# Patient Record
Sex: Male | Born: 1950 | Race: White | Hispanic: No | Marital: Married | Smoking: Current some day smoker
Health system: Southern US, Community
[De-identification: ages and names within clinical notes are randomized; demographics above are authoritative.]

## PROBLEM LIST (undated history)

## (undated) ENCOUNTER — Emergency Department: Admission: EM | Payer: BLUE CROSS/BLUE SHIELD | Source: Home / Self Care

## (undated) DIAGNOSIS — N529 Male erectile dysfunction, unspecified: Secondary | ICD-10-CM

## (undated) DIAGNOSIS — I493 Ventricular premature depolarization: Secondary | ICD-10-CM

## (undated) DIAGNOSIS — IMO0001 Reserved for inherently not codable concepts without codable children: Secondary | ICD-10-CM

## (undated) DIAGNOSIS — I739 Peripheral vascular disease, unspecified: Secondary | ICD-10-CM

## (undated) DIAGNOSIS — R519 Headache, unspecified: Secondary | ICD-10-CM

## (undated) DIAGNOSIS — S7400XA Injury of sciatic nerve at hip and thigh level, unspecified leg, initial encounter: Secondary | ICD-10-CM

## (undated) DIAGNOSIS — R0609 Other forms of dyspnea: Secondary | ICD-10-CM

## (undated) DIAGNOSIS — D539 Nutritional anemia, unspecified: Secondary | ICD-10-CM

## (undated) DIAGNOSIS — Z7901 Long term (current) use of anticoagulants: Secondary | ICD-10-CM

## (undated) DIAGNOSIS — F1010 Alcohol abuse, uncomplicated: Secondary | ICD-10-CM

## (undated) DIAGNOSIS — H35039 Hypertensive retinopathy, unspecified eye: Secondary | ICD-10-CM

## (undated) DIAGNOSIS — I4819 Other persistent atrial fibrillation: Secondary | ICD-10-CM

## (undated) DIAGNOSIS — M48061 Spinal stenosis, lumbar region without neurogenic claudication: Secondary | ICD-10-CM

## (undated) DIAGNOSIS — I499 Cardiac arrhythmia, unspecified: Secondary | ICD-10-CM

## (undated) DIAGNOSIS — I10 Essential (primary) hypertension: Secondary | ICD-10-CM

## (undated) DIAGNOSIS — M5030 Other cervical disc degeneration, unspecified cervical region: Secondary | ICD-10-CM

## (undated) DIAGNOSIS — F17200 Nicotine dependence, unspecified, uncomplicated: Secondary | ICD-10-CM

## (undated) DIAGNOSIS — J449 Chronic obstructive pulmonary disease, unspecified: Secondary | ICD-10-CM

## (undated) DIAGNOSIS — E785 Hyperlipidemia, unspecified: Secondary | ICD-10-CM

## (undated) DIAGNOSIS — I700 Atherosclerosis of aorta: Secondary | ICD-10-CM

## (undated) DIAGNOSIS — Z77090 Contact with and (suspected) exposure to asbestos: Secondary | ICD-10-CM

## (undated) DIAGNOSIS — I2510 Atherosclerotic heart disease of native coronary artery without angina pectoris: Secondary | ICD-10-CM

## (undated) DIAGNOSIS — R768 Other specified abnormal immunological findings in serum: Secondary | ICD-10-CM

## (undated) DIAGNOSIS — M1990 Unspecified osteoarthritis, unspecified site: Secondary | ICD-10-CM

## (undated) DIAGNOSIS — G43909 Migraine, unspecified, not intractable, without status migrainosus: Secondary | ICD-10-CM

## (undated) DIAGNOSIS — K759 Inflammatory liver disease, unspecified: Secondary | ICD-10-CM

## (undated) HISTORY — DX: Atherosclerotic heart disease of native coronary artery without angina pectoris: I25.10

## (undated) HISTORY — DX: Atherosclerosis of aorta: I70.0

## (undated) HISTORY — PX: JOINT REPLACEMENT: SHX530

## (undated) HISTORY — DX: Alcohol abuse, uncomplicated: F10.10

## (undated) HISTORY — DX: Injury of sciatic nerve at hip and thigh level, unspecified leg, initial encounter: S74.00XA

## (undated) HISTORY — DX: Other specified abnormal immunological findings in serum: R76.8

## (undated) HISTORY — DX: Essential (primary) hypertension: I10

## (undated) HISTORY — DX: Chronic obstructive pulmonary disease, unspecified: J44.9

## (undated) HISTORY — DX: Hyperlipidemia, unspecified: E78.5

## (undated) HISTORY — DX: Nicotine dependence, unspecified, uncomplicated: F17.200

## (undated) HISTORY — DX: Spinal stenosis, lumbar region without neurogenic claudication: M48.061

---

## 1982-07-18 HISTORY — PX: KNEE ARTHROSCOPY: SHX127

## 1996-05-22 ENCOUNTER — Encounter: Payer: Self-pay | Admitting: Family Medicine

## 1996-05-22 LAB — CONVERTED CEMR LAB
Blood Glucose, Fasting: 96 mg/dL
TSH: 2.48 microintl units/mL

## 1996-05-23 ENCOUNTER — Encounter: Payer: Self-pay | Admitting: Family Medicine

## 1996-05-23 LAB — CONVERTED CEMR LAB: PSA: 0.8 ng/mL

## 1996-06-17 DIAGNOSIS — E785 Hyperlipidemia, unspecified: Secondary | ICD-10-CM

## 1996-06-17 HISTORY — DX: Hyperlipidemia, unspecified: E78.5

## 1998-05-04 ENCOUNTER — Encounter: Payer: Self-pay | Admitting: Family Medicine

## 1998-05-04 LAB — CONVERTED CEMR LAB: Blood Glucose, Fasting: 111 mg/dL

## 1998-05-18 DIAGNOSIS — I10 Essential (primary) hypertension: Secondary | ICD-10-CM

## 1998-05-18 HISTORY — DX: Essential (primary) hypertension: I10

## 1998-07-18 DIAGNOSIS — J449 Chronic obstructive pulmonary disease, unspecified: Secondary | ICD-10-CM

## 1998-07-18 HISTORY — DX: Chronic obstructive pulmonary disease, unspecified: J44.9

## 1999-11-29 ENCOUNTER — Encounter: Payer: Self-pay | Admitting: Family Medicine

## 1999-11-29 LAB — CONVERTED CEMR LAB
Blood Glucose, Fasting: 110 mg/dL
TSH: 1.04 microintl units/mL

## 2000-11-08 ENCOUNTER — Encounter: Payer: Self-pay | Admitting: Family Medicine

## 2000-11-08 LAB — CONVERTED CEMR LAB
Blood Glucose, Fasting: 111 mg/dL
TSH: 2.12 microintl units/mL

## 2001-07-18 HISTORY — PX: OTHER SURGICAL HISTORY: SHX169

## 2001-07-18 HISTORY — PX: ANTERIOR CERVICAL DECOMP/DISCECTOMY FUSION: SHX1161

## 2001-11-29 ENCOUNTER — Encounter: Payer: Self-pay | Admitting: Family Medicine

## 2001-11-29 LAB — CONVERTED CEMR LAB
PSA: 0.6 ng/mL
TSH: 1.77 microintl units/mL

## 2002-06-26 ENCOUNTER — Inpatient Hospital Stay (HOSPITAL_COMMUNITY): Admission: RE | Admit: 2002-06-26 | Discharge: 2002-06-28 | Payer: Self-pay | Admitting: Neurosurgery

## 2003-01-16 ENCOUNTER — Encounter: Payer: Self-pay | Admitting: Family Medicine

## 2003-01-16 LAB — CONVERTED CEMR LAB
Blood Glucose, Fasting: 101 mg/dL
PSA: 0.5 ng/mL
RBC count: 4.49 10*6/uL
TSH: 2.36 microintl units/mL
WBC, blood: 5.5 10*3/uL

## 2004-04-27 ENCOUNTER — Encounter: Payer: Self-pay | Admitting: Family Medicine

## 2004-04-27 LAB — CONVERTED CEMR LAB
Blood Glucose, Fasting: 112 mg/dL
PSA: 0.4 ng/mL
TSH: 2.09 microintl units/mL

## 2004-05-24 ENCOUNTER — Ambulatory Visit: Payer: Self-pay | Admitting: Family Medicine

## 2004-05-24 LAB — CONVERTED CEMR LAB
Blood Glucose, Fasting: 122 mg/dL
Hgb A1c MFr Bld: 5.2 %

## 2004-05-26 ENCOUNTER — Ambulatory Visit: Payer: Self-pay | Admitting: Family Medicine

## 2005-05-20 ENCOUNTER — Ambulatory Visit: Payer: Self-pay | Admitting: Family Medicine

## 2005-05-20 LAB — CONVERTED CEMR LAB
Blood Glucose, Fasting: 107 mg/dL
PSA: 0.5 ng/mL
TSH: 1.3 microintl units/mL

## 2005-07-18 HISTORY — PX: BACK SURGERY: SHX140

## 2006-02-11 ENCOUNTER — Emergency Department (HOSPITAL_COMMUNITY): Admission: EM | Admit: 2006-02-11 | Discharge: 2006-02-11 | Payer: Self-pay | Admitting: Emergency Medicine

## 2006-02-14 ENCOUNTER — Ambulatory Visit (HOSPITAL_COMMUNITY): Admission: RE | Admit: 2006-02-14 | Discharge: 2006-02-15 | Payer: Self-pay | Admitting: Neurosurgery

## 2006-04-03 ENCOUNTER — Ambulatory Visit: Payer: Self-pay | Admitting: Family Medicine

## 2006-04-03 LAB — CONVERTED CEMR LAB
Blood Glucose, Fasting: 115 mg/dL
PSA: 0.48 ng/mL
TSH: 1.28 microintl units/mL

## 2006-04-05 ENCOUNTER — Ambulatory Visit: Payer: Self-pay | Admitting: Family Medicine

## 2006-12-12 ENCOUNTER — Telehealth (INDEPENDENT_AMBULATORY_CARE_PROVIDER_SITE_OTHER): Payer: Self-pay | Admitting: *Deleted

## 2007-07-31 ENCOUNTER — Encounter: Payer: Self-pay | Admitting: Family Medicine

## 2007-07-31 DIAGNOSIS — E785 Hyperlipidemia, unspecified: Secondary | ICD-10-CM | POA: Insufficient documentation

## 2007-07-31 DIAGNOSIS — Z8619 Personal history of other infectious and parasitic diseases: Secondary | ICD-10-CM | POA: Insufficient documentation

## 2007-07-31 DIAGNOSIS — R7303 Prediabetes: Secondary | ICD-10-CM | POA: Insufficient documentation

## 2007-07-31 DIAGNOSIS — M5030 Other cervical disc degeneration, unspecified cervical region: Secondary | ICD-10-CM | POA: Insufficient documentation

## 2007-07-31 DIAGNOSIS — J449 Chronic obstructive pulmonary disease, unspecified: Secondary | ICD-10-CM | POA: Insufficient documentation

## 2007-07-31 DIAGNOSIS — I10 Essential (primary) hypertension: Secondary | ICD-10-CM | POA: Insufficient documentation

## 2007-07-31 DIAGNOSIS — J309 Allergic rhinitis, unspecified: Secondary | ICD-10-CM | POA: Insufficient documentation

## 2007-08-10 ENCOUNTER — Ambulatory Visit: Payer: Self-pay | Admitting: Family Medicine

## 2007-08-10 LAB — CONVERTED CEMR LAB
ALT: 27 units/L (ref 0–53)
AST: 19 units/L (ref 0–37)
Albumin: 4.2 g/dL (ref 3.5–5.2)
Alkaline Phosphatase: 61 units/L (ref 39–117)
BUN: 10 mg/dL (ref 6–23)
Bilirubin, Direct: 0.1 mg/dL (ref 0.0–0.3)
CO2: 28 meq/L (ref 19–32)
Calcium: 9.6 mg/dL (ref 8.4–10.5)
Chloride: 105 meq/L (ref 96–112)
Cholesterol: 175 mg/dL (ref 0–200)
Creatinine, Ser: 1.1 mg/dL (ref 0.4–1.5)
Creatinine,U: 107.8 mg/dL
GFR calc Af Amer: 89 mL/min
GFR calc non Af Amer: 74 mL/min
Glucose, Bld: 118 mg/dL — ABNORMAL HIGH (ref 70–99)
HDL: 80 mg/dL (ref >39.0)
LDL Cholesterol: 87 mg/dL (ref 0–99)
Microalb Creat Ratio: 1.9 mg/g (ref 0.0–30.0)
Microalb, Ur: 0.2 mg/dL (ref 0.0–1.9)
PSA: 2.35 ng/mL (ref 0.10–4.00)
Potassium: 5 meq/L (ref 3.5–5.1)
Sodium: 139 meq/L (ref 135–145)
TSH: 1.55 microintl units/mL (ref 0.35–5.50)
Total Bilirubin: 1 mg/dL (ref 0.3–1.2)
Total CHOL/HDL Ratio: 2.2
Total Protein: 6.6 g/dL (ref 6.0–8.3)
Triglycerides: 40 mg/dL (ref 0–149)
VLDL: 8 mg/dL (ref 0–40)

## 2007-08-15 ENCOUNTER — Ambulatory Visit: Payer: Self-pay | Admitting: Family Medicine

## 2007-10-11 ENCOUNTER — Ambulatory Visit: Payer: Self-pay | Admitting: Family Medicine

## 2007-10-13 LAB — CONVERTED CEMR LAB: PSA: 0.52 ng/mL (ref 0.10–4.00)

## 2007-10-15 ENCOUNTER — Ambulatory Visit: Payer: Self-pay | Admitting: Family Medicine

## 2007-10-15 ENCOUNTER — Encounter (INDEPENDENT_AMBULATORY_CARE_PROVIDER_SITE_OTHER): Payer: Self-pay | Admitting: *Deleted

## 2007-10-19 ENCOUNTER — Ambulatory Visit: Payer: Self-pay | Admitting: Family Medicine

## 2007-10-19 LAB — CONVERTED CEMR LAB
OCCULT 1: NEGATIVE
OCCULT 2: NEGATIVE
OCCULT 3: NEGATIVE

## 2007-10-22 ENCOUNTER — Encounter (INDEPENDENT_AMBULATORY_CARE_PROVIDER_SITE_OTHER): Payer: Self-pay | Admitting: *Deleted

## 2008-07-18 HISTORY — PX: OTHER SURGICAL HISTORY: SHX169

## 2008-08-19 ENCOUNTER — Ambulatory Visit: Payer: Self-pay | Admitting: Family Medicine

## 2008-08-19 LAB — CONVERTED CEMR LAB
ALT: 23 units/L (ref 0–53)
AST: 20 units/L (ref 0–37)
Albumin: 4.1 g/dL (ref 3.5–5.2)
Alkaline Phosphatase: 65 units/L (ref 39–117)
BUN: 8 mg/dL (ref 6–23)
Basophils Absolute: 0 10*3/uL (ref 0.0–0.1)
Basophils Relative: 0.1 % (ref 0.0–3.0)
Bilirubin, Direct: 0.1 mg/dL (ref 0.0–0.3)
CO2: 27 meq/L (ref 19–32)
Calcium: 9.3 mg/dL (ref 8.4–10.5)
Chloride: 106 meq/L (ref 96–112)
Cholesterol: 185 mg/dL (ref 0–200)
Creatinine, Ser: 0.9 mg/dL (ref 0.4–1.5)
Creatinine,U: 144.6 mg/dL
Eosinophils Absolute: 0.2 10*3/uL (ref 0.0–0.7)
Eosinophils Relative: 2.4 % (ref 0.0–5.0)
GFR calc Af Amer: 112 mL/min
GFR calc non Af Amer: 92 mL/min
Glucose, Bld: 91 mg/dL (ref 70–99)
HCT: 39.3 % (ref 39.0–52.0)
HDL: 83.3 mg/dL (ref >39.0)
Hemoglobin: 13.4 g/dL (ref 13.0–17.0)
LDL Cholesterol: 91 mg/dL (ref 0–99)
Lymphocytes Relative: 34.4 % (ref 12.0–46.0)
MCHC: 34 g/dL (ref 30.0–36.0)
MCV: 99.6 fL (ref 78.0–100.0)
Microalb Creat Ratio: 4.8 mg/g (ref 0.0–30.0)
Microalb, Ur: 0.7 mg/dL (ref 0.0–1.9)
Monocytes Absolute: 0.5 10*3/uL (ref 0.1–1.0)
Monocytes Relative: 7.4 % (ref 3.0–12.0)
Neutro Abs: 3.8 10*3/uL (ref 1.4–7.7)
Neutrophils Relative %: 55.7 % (ref 43.0–77.0)
PSA: 0.65 ng/mL (ref 0.10–4.00)
Platelets: 225 10*3/uL (ref 150–400)
Potassium: 4.5 meq/L (ref 3.5–5.1)
RBC: 3.95 M/uL — ABNORMAL LOW (ref 4.22–5.81)
RDW: 12.2 % (ref 11.5–14.6)
Sodium: 139 meq/L (ref 135–145)
TSH: 1.59 microintl units/mL (ref 0.35–5.50)
Total Bilirubin: 0.9 mg/dL (ref 0.3–1.2)
Total CHOL/HDL Ratio: 2.2
Total Protein: 6.6 g/dL (ref 6.0–8.3)
Triglycerides: 56 mg/dL (ref 0–149)
VLDL: 11 mg/dL (ref 0–40)
WBC: 6.9 10*3/uL (ref 4.5–10.5)

## 2008-08-21 ENCOUNTER — Ambulatory Visit: Payer: Self-pay | Admitting: Family Medicine

## 2008-09-08 ENCOUNTER — Ambulatory Visit: Payer: Self-pay | Admitting: Family Medicine

## 2008-09-08 LAB — CONVERTED CEMR LAB
OCCULT 1: NEGATIVE
OCCULT 2: NEGATIVE
OCCULT 3: NEGATIVE

## 2008-09-09 ENCOUNTER — Encounter (INDEPENDENT_AMBULATORY_CARE_PROVIDER_SITE_OTHER): Payer: Self-pay | Admitting: *Deleted

## 2008-11-06 ENCOUNTER — Encounter: Payer: Self-pay | Admitting: Internal Medicine

## 2008-11-06 ENCOUNTER — Ambulatory Visit: Payer: Self-pay | Admitting: Internal Medicine

## 2008-11-06 DIAGNOSIS — I739 Peripheral vascular disease, unspecified: Secondary | ICD-10-CM | POA: Insufficient documentation

## 2008-11-06 DIAGNOSIS — R0683 Snoring: Secondary | ICD-10-CM | POA: Insufficient documentation

## 2008-11-06 DIAGNOSIS — R0602 Shortness of breath: Secondary | ICD-10-CM | POA: Insufficient documentation

## 2008-11-12 ENCOUNTER — Ambulatory Visit: Admission: RE | Admit: 2008-11-12 | Discharge: 2008-11-12 | Payer: Self-pay | Admitting: Internal Medicine

## 2008-11-25 ENCOUNTER — Ambulatory Visit: Payer: Self-pay

## 2008-11-25 ENCOUNTER — Encounter: Payer: Self-pay | Admitting: Internal Medicine

## 2008-12-10 ENCOUNTER — Encounter: Payer: Self-pay | Admitting: Internal Medicine

## 2008-12-10 ENCOUNTER — Ambulatory Visit: Payer: Self-pay | Admitting: Internal Medicine

## 2009-02-19 ENCOUNTER — Ambulatory Visit: Payer: Self-pay | Admitting: Family Medicine

## 2009-07-03 ENCOUNTER — Ambulatory Visit: Payer: Self-pay | Admitting: Internal Medicine

## 2009-07-09 ENCOUNTER — Ambulatory Visit: Payer: Self-pay | Admitting: Internal Medicine

## 2009-07-18 HISTORY — PX: KNEE ARTHROSCOPY: SHX127

## 2009-07-18 HISTORY — PX: SHOULDER ARTHROSCOPY: SHX128

## 2009-08-24 ENCOUNTER — Ambulatory Visit: Payer: Self-pay | Admitting: Family Medicine

## 2009-08-24 LAB — CONVERTED CEMR LAB
ALT: 29 units/L (ref 0–53)
AST: 24 units/L (ref 0–37)
Albumin: 4.4 g/dL (ref 3.5–5.2)
Alkaline Phosphatase: 70 units/L (ref 39–117)
BUN: 9 mg/dL (ref 6–23)
Basophils Absolute: 0 10*3/uL (ref 0.0–0.1)
Basophils Relative: 0.4 % (ref 0.0–3.0)
Bilirubin, Direct: 0.1 mg/dL (ref 0.0–0.3)
CO2: 32 meq/L (ref 19–32)
Calcium: 9.6 mg/dL (ref 8.4–10.5)
Chloride: 105 meq/L (ref 96–112)
Cholesterol: 203 mg/dL — ABNORMAL HIGH (ref 0–200)
Creatinine, Ser: 1 mg/dL (ref 0.4–1.5)
Creatinine,U: 58.1 mg/dL
Direct LDL: 111.7 mg/dL
Eosinophils Absolute: 0.1 10*3/uL (ref 0.0–0.7)
Eosinophils Relative: 1.8 % (ref 0.0–5.0)
GFR calc non Af Amer: 81.53 mL/min (ref >60)
Glucose, Bld: 107 mg/dL — ABNORMAL HIGH (ref 70–99)
HCT: 40.5 % (ref 39.0–52.0)
HDL: 84.4 mg/dL (ref >39.00)
Hemoglobin: 13.7 g/dL (ref 13.0–17.0)
Lymphocytes Relative: 48.9 % — ABNORMAL HIGH (ref 12.0–46.0)
Lymphs Abs: 2.4 10*3/uL (ref 0.7–4.0)
MCHC: 33.7 g/dL (ref 30.0–36.0)
MCV: 100.4 fL — ABNORMAL HIGH (ref 78.0–100.0)
Microalb Creat Ratio: 5.2 mg/g (ref 0.0–30.0)
Microalb, Ur: 0.3 mg/dL (ref 0.0–1.9)
Monocytes Absolute: 0.5 10*3/uL (ref 0.1–1.0)
Monocytes Relative: 9.5 % (ref 3.0–12.0)
Neutro Abs: 1.9 10*3/uL (ref 1.4–7.7)
Neutrophils Relative %: 39.4 % — ABNORMAL LOW (ref 43.0–77.0)
PSA: 0.62 ng/mL (ref 0.10–4.00)
Platelets: 240 10*3/uL (ref 150.0–400.0)
Potassium: 4.8 meq/L (ref 3.5–5.1)
RBC: 4.04 M/uL — ABNORMAL LOW (ref 4.22–5.81)
RDW: 12.4 % (ref 11.5–14.6)
Sodium: 142 meq/L (ref 135–145)
TSH: 1.77 microintl units/mL (ref 0.35–5.50)
Total Bilirubin: 1 mg/dL (ref 0.3–1.2)
Total CHOL/HDL Ratio: 2
Total Protein: 6.9 g/dL (ref 6.0–8.3)
Triglycerides: 51 mg/dL (ref 0.0–149.0)
VLDL: 10.2 mg/dL (ref 0.0–40.0)
WBC: 4.9 10*3/uL (ref 4.5–10.5)

## 2009-08-25 LAB — CONVERTED CEMR LAB: Vit D, 25-Hydroxy: 25 ng/mL — ABNORMAL LOW (ref 30–89)

## 2009-08-27 ENCOUNTER — Ambulatory Visit: Payer: Self-pay | Admitting: Family Medicine

## 2009-09-18 ENCOUNTER — Ambulatory Visit: Payer: Self-pay | Admitting: Family Medicine

## 2009-09-18 LAB — CONVERTED CEMR LAB
OCCULT 1: NEGATIVE
OCCULT 2: NEGATIVE
OCCULT 3: NEGATIVE

## 2009-09-21 ENCOUNTER — Encounter (INDEPENDENT_AMBULATORY_CARE_PROVIDER_SITE_OTHER): Payer: Self-pay | Admitting: *Deleted

## 2009-11-09 ENCOUNTER — Encounter: Payer: Self-pay | Admitting: Internal Medicine

## 2009-11-10 ENCOUNTER — Encounter: Payer: Self-pay | Admitting: Family Medicine

## 2009-11-12 ENCOUNTER — Telehealth: Payer: Self-pay | Admitting: Internal Medicine

## 2009-11-12 ENCOUNTER — Telehealth: Payer: Self-pay | Admitting: Family Medicine

## 2009-11-17 ENCOUNTER — Ambulatory Visit: Payer: Self-pay | Admitting: Family Medicine

## 2009-11-17 DIAGNOSIS — I4949 Other premature depolarization: Secondary | ICD-10-CM | POA: Insufficient documentation

## 2010-02-08 ENCOUNTER — Ambulatory Visit: Payer: Self-pay | Admitting: Family Medicine

## 2010-02-08 DIAGNOSIS — B351 Tinea unguium: Secondary | ICD-10-CM | POA: Insufficient documentation

## 2010-02-23 ENCOUNTER — Encounter (INDEPENDENT_AMBULATORY_CARE_PROVIDER_SITE_OTHER): Payer: Self-pay | Admitting: *Deleted

## 2010-08-17 NOTE — Letter (Signed)
Summary: Results Follow up Letter  [place] [facility]  [address] [place], [place] [address]   Phone: [phone]  Fax: [phone]    09/21/2009 MRN: [id number]  [name] [address] [place], [place]  [address]  Dear [name],  The following are the results of your recent test(s):  Test         Result    Pap Smear:        Normal _____  Not Normal _____ Comments: ______________________________________________________ Cholesterol: LDL(Bad cholesterol):         Your goal is less than:         HDL (Good cholesterol):       Your goal is more than: Comments:  ______________________________________________________ Mammogram:        Normal _____  Not Normal _____ Comments:  ___________________________________________________________________ Hemoccult:        Normal ___X__  Not normal _______ Comments:  Please repeat in one year.  _____________________________________________________________________ Other Tests:    We routinely do not discuss normal results over the telephone.  If you desire a copy of the results, or you have any questions about this information we can discuss them at your next office visit.   Sincerely,     Laurita Quint, MD

## 2010-08-17 NOTE — Letter (Signed)
Summary: Surgical Clearance/[facility]  Surgical Clearance/[facility]   Imported By: Lanelle Bal 11/16/2009 09:06:40  _____________________________________________________________________  External Attachment:    Type:   Image     Comment:   External Document

## 2010-08-17 NOTE — Assessment & Plan Note (Signed)
Summary: [facility]   Primary Provider:  Shaune Leeks MD   History of Present Illness: 60 y/o male with HTN, HL and COPD with ongoing tobacco use returns for f/u on his PVCs and dyspnea.  I saw him for first time last month after he fialed flow meter test at work and also found to have PVCs.  Very strong family history of CAD but no known h/o CAD. Referred for evlauation of skipped beats and failed flow meter test at work.  Echo showed EF 60-65% with mild diastolic dysfunction. Mild RAE.  Ab u/s an ABIs were normal. PFTs showed moderate COPD  (FEV1/FVC = 60% predicted) particularly in small airways (FEF 25-75% = 36% predicted)with normal DLCO and good response to bronchdilators.  Has been using nicotine gum and really cut back on smoking. now only 1 pack in last month. Breathing better already. Active no CP or palpiations. No edema. Still snores.      Current Medications (verified): 1)  Lisinopril 10 Mg Tabs (Lisinopril) .... One  Tab By Mouth Once Daily 2)  Lipitor 10 Mg  Tabs (Atorvastatin Calcium) .[name].. 1 Tablet At Bedtime By Mouth 3)  Bayer Aspirin 325 Mg  Tabs (Aspirin) .[name].. 1 Daily 4)  Cialis 20 Mg  Tabs (Tadalafil) .... 1/2 Tablet Pior  Allergies (verified): No Known Drug Allergies  Past History:  Past Medical History: Reviewed history from 12/09/2008 and no changes required. Allergic rhinitis(:07/1998) COPD:(07/1998) with ongoing tobacco use Hyperlipidemia:(06/1996) Hypertension:(05/1998) ETOH abuse Back surgery 2007 Neck fusion 20003 Erectile dysfunction ETT/Myoview   --walked 12 mins. EF 61% diaphragmatic attenuation. no ischemia Echo EF 60-65%  Review of Systems       As per HPI and past medical history; otherwise all systems negative.   Vital Signs:  Patient profile:   60 year old male Height:      66 inches Weight:      186.75 pounds BMI:     30.25 Pulse rate:   60 / minute BP sitting:   126 / 66  (left arm) Cuff size:   regular   Physical Exam  General:  Gen: well appearing. no resp difficulty HEENT: normal Neck: thick. supple. no JVD. Carotids 2+ bilat; not bruits. No lymphadenopathy or thryomegaly appreciated. Cor: PMI nondisplaced. Regular rate & rhythm. No rubs, murmur. +s4. occasional ectopy Lungs: clear with decreased air movement. no wheezing Abdomen: obeses soft, nontender, nondistended. No hepatosplenomegaly. No bruits or masses. Good bowel sounds. Extremities: no cyanosis, clubbing, rash, edema  Neuro: alert & orientedx3, cranial nerves grossly intact. moves all 4 extremities w/o difficulty. affect pleasant    Impression & Recommendations:  Problem # 1:  COPD (ICD-496) Will start symbicort. Congratulated him on cutting back on cigarettes.   Problem # 2:  PREMATURE VENTRICULAR CONTRACTIONS (ICD-427.69) EF is normal. These are benign.   Problem # 3:  TOBACCO USER (ICD-305.1) Congratulated him on cutting back. Hopefully he will be able to stop completely.  Problem # 4:  SNORING (ICD-786.09) Likely does have sleep apnea but reluctant about CPAP. Will try 3 month attempt at weight loss.

## 2010-08-17 NOTE — Assessment & Plan Note (Signed)
Summary: [name]   Visit Type:  Follow-up Primary Provider:  Shaune Leeks MD  CC:  SOB- no worse than normal/no other cardiac complaints.  History of Present Illness: 60 y/o male with HTN, HL and COPD with ongoing tobacco use returns for f/u on his PVCs and dyspnea.  Echo showed EF 60-65% with mild diastolic dysfunction. Mild RAE.  Ab u/s an ABIs were normal. PFTs showed moderate COPD  (FEV1/FVC = 60% predicted) particularly in small airways (FEF 25-75% = 36% predicted)with normal DLCO and good response to bronchdilators.  Returns for routine f/u. Stopped smoking for about 2 months but now back to smoking 1/3 ppd. Not very active. Does get SOB with moderate to vigorous activity. No CP. No palpitations. No claudication. No syncope/presyncope. Still snoring. Refuses sleep study.      Current Medications (verified): 1)  Lisinopril 10 Mg Tabs (Lisinopril) .... One  Tab By Mouth Once Daily 2)  Lipitor 10 Mg  Tabs (Atorvastatin Calcium) .[name].. 1 Tablet At Bedtime By Mouth 3)  Bayer Aspirin 325 Mg  Tabs (Aspirin) .[name].. 1 Daily 4)  Cialis 20 Mg  Tabs (Tadalafil) .... 1/2 Tablet Pior 5)  Symbicort 80-4.5 Mcg/act Aero (Budesonide-Formoterol Fumarate) .... 2 Puffs Twice A Day As Needed  Allergies (verified): No Known Drug Allergies  Past History:  Past Medical History: Allergic rhinitis(:07/1998) COPD:(07/1998) with ongoing tobacco use Hyperlipidemia:(06/1996) Hypertension:(05/1998) h/oETOH abuse Back surgery 2007 Neck fusion 20003 Erectile dysfunction ETT/Myoview (2002)   --walked 12 mins. EF 61% diaphragmatic attenuation. no ischemia Echo EF 60-65%  Review of Systems       As per HPI and past medical history; otherwise all systems negative.   Vital Signs:  Patient profile:   60 year old male Weight:      197.50 pounds Pulse rate:   72 / minute Pulse rhythm:   regular BP sitting:   118 / 72  (left arm) Cuff size:   regular  Vitals Entered By: Mercer Pod  (July 03, 2009 3:20 PM)  Physical Exam  General:  Gen: well appearing. no resp difficulty HEENT: normal Neck: thick. supple. no JVD. Carotids 2+ bilat; not bruits. No lymphadenopathy or thryomegaly appreciated. Cor: PMI nondisplaced. Regular rate & rhythm. No rubs, murmur. +s4. occasional ectopy Lungs: clear with decreased air movement. no wheezing Abdomen: obeses soft, nontender, nondistended. No hepatosplenomegaly. No bruits or masses. Good bowel sounds. Extremities: no cyanosis, clubbing, rash, edema  Neuro: alert & orientedx3, cranial nerves grossly intact. moves all 4 extremities w/o difficulty. affect pleasant    Impression & Recommendations:  Problem # 1:  DYSPNEA (ICD-786.05) Likely due primarily to COPD but given CRFs and inactivty with check ETT to evaluate for underlying CAD.  Problem # 2:  TOBACCO USER (ICD-305.1) Counseled on need to quit completely.   Problem # 3:  HYPERTENSION (ICD-401.9) Blood pressure well controlled. Continue current regimen.  Problem # 4:  SNORING (ICD-786.09) Refusing sleep study for now.   Patient Instructions: 1)  Your physician has requested that you have an exercise tolerance test.  For further information please visit [email or web address]  Please also follow instruction sheet, as given.  Appended Document: Orders Update    Clinical Lists Changes  Orders: Added new Referral order of Treadmill (Treadmill) - Signed

## 2010-08-17 NOTE — Assessment & Plan Note (Signed)
Summary: NAIL FUNGUS/DR SCHALLER'S PT/CLE   Vital Signs:  Patient profile:   60 year old male Height:      66 inches Weight:      195.75 pounds BMI:     31.71 Temp:     98.8 degrees F oral Pulse rate:   100 / minute Pulse rhythm:   regular BP sitting:   124 / 76  (left arm) Cuff size:   large  Vitals Entered By: Delilah Shan CMA Darcell Yacoub Dull) (February 08, 2010 4:02 PM) CC: Nail fungus   History of Present Illness: Nail fungus.  "It doesn't bother me, but it looks gross."  Asking about options.   Allergies: No Known Drug Allergies  Past History:  Past Surgical History: RECTAL FISSURE REPAIR  ([place]) LEFT KNEE ARTHROSCOPY WITH MENISECTOMY MEDIAL:(11/02/1999) NECK MRI DDD SPINAL CORD COMP. C3/4, C4/5, C5/6 , C6/7, HNP MULTIPLE:(07/31/2001) CARDIOLITE NORMAL(:12/25/2001) ANTERIOR CERVICAL DISEC. FUSION/ C3-4,C4-5, C5-6, C6-7:DRJENKINS):(06/26/2002) LAMINECTOMY L4/5:( DR. JENKINS):(03/14/2006) ETT Nml  12./2010 R Rotator cuff surgery 2011, Dr. Ranell Patrick.   Review of Systems       See HPI.  Otherwise negative.    Physical Exam  General:  Nails with onychomycotic changes on both hands and on bilateral great toes.     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  I advised that it was cosmetic and didn't require treatment.  No charge due to brevity of encounter.  Orders: No Charge Patient Arrived (NCPA0) (NCPA0)  He asked about cialis samples; was given 7x2 of the 5mg  strength.  Take 2 tabs at a time.  follow up for CPE.   Complete Medication List: 1)  Lisinopril 10 Mg Tabs (Lisinopril) .... One  tab by mouth once daily 2)  Lipitor 10 Mg Tabs (Atorvastatin calcium) .[name].. 1 tablet at bedtime by mouth 3)  Bayer Aspirin 325 Mg Tabs (Aspirin) .[name].. 1 daily 4)  Cialis 20 Mg Tabs (Tadalafil) .... 1/2 tablet pior 5)  Symbicort 80-4.5 Mcg/act  Aero (Budesonide-formoterol fumarate) .... 2 puffs twice a day as needed  Patient Instructions: 1)  Please schedule a follow-up appointment as needed and a physical in 08/2010.   Current Allergies (reviewed today): No known allergies

## 2010-08-17 NOTE — Assessment & Plan Note (Signed)
Summary: 6 MONTH FOLLOW UP FOR BP CHECK / LFW   Vital Signs:  Patient profile:   60 year old male Height:      66 inches Weight:      190 pounds BMI:     30.78 Temp:     98.7 degrees F Pulse rate:   72 / minute Pulse rhythm:   regular BP sitting:   120 / 70  (right arm) Cuff size:   regular  Vitals Entered By: Providence Crosby LPN (February 19, 2009 3:50 PM) CC: followup   History of Present Illness: Pt here for Followup of Htn. He has been seen by Dr Clarise Cruz for CP and suggested quitting smoking and wt loss. Was having PVCs and was checked out and were benign.  Allergies (verified): No Known Drug Allergies   Impression & Recommendations:  Problem # 1:  HYPERTENSION (ICD-401.9) Assessment Unchanged Stable.  His updated medication list for this problem includes:    Lisinopril 10 Mg Tabs (Lisinopril) ..... One  tab by mouth once daily  BP today: 120/70 Prior BP: 126/66 (12/10/2008)  Labs Reviewed: K+: 4.5 (08/19/2008) Creat: : 0.9 (08/19/2008)   Chol: 185 (08/19/2008)   HDL: 83.3 (08/19/2008)   LDL: 91 (08/19/2008)   TG: 56 (08/19/2008)  Problem # 2:  TOBACCO USER (ICD-305.1) Assessment: Unchanged Had quit, then started back, now encouraged to quit again.  Problem # 3:  COPD (ICD-496) Assessment: Unchanged Discussed smoking. See above. His updated medication list for this problem includes:    Symbicort 80-4.5 Mcg/act Aero (Budesonide-formoterol fumarate) .[name]... 2 puffs twice a day  Problem # 4:  PREMATURE VENTRICULAR CONTRACTIONS (ICD-427.69) Assessment: Unchanged Stable today. His updated medication list for this problem includes:    Bayer Aspirin 325 Mg Tabs (Aspirin) .[name]... 1 daily  Complete Medication List: 1)  Lisinopril 10 Mg Tabs (Lisinopril) .... One  tab by mouth once daily 2)  Lipitor 10 Mg Tabs (Atorvastatin calcium) .[name].. 1 tablet at bedtime by mouth 3)  Bayer Aspirin 325 Mg Tabs (Aspirin) .[name].. 1 daily 4)  Cialis 20 Mg Tabs (Tadalafil) .... 1/2 tablet  pior 5)  Symbicort 80-4.5 Mcg/act Aero (Budesonide-formoterol fumarate) .... 2 puffs twice a day   Patient Instructions: 1)  RTC for COMP EXAM, labs prior

## 2010-08-17 NOTE — Letter (Signed)
Summary: External Other  External Other   Imported By: West Carbo 11/18/2009 08:31:50  _____________________________________________________________________  External Attachment:    Type:   Image     Comment:   External Document

## 2010-08-17 NOTE — Assessment & Plan Note (Signed)
Summary: CPX   Vital Signs:  Patient Profile:   60 Years Old Male Height:     66 inches (170.18 cm) Weight:      184 pounds Temp:     98.8 degrees F oral Pulse rate:   76 / minute Pulse rhythm:   regular BP sitting:   140 / 80  (left arm) Cuff size:   regular  Vitals Entered By: Providence Crosby (August 15, 2007 9:33 AM)                 Chief Complaint:  check up hemocult cards to patient.  History of Present Illness: Here for PE, more relaxed with kids out of house...sleeps all the time.  Current Allergies: No known allergies    Family History:    Father:DECEASED AT 23 : CAD, HTN; STROKE; DM; ETOH; CABG X 3 LATE 50"S     Mother: DECEASED 63 CAD ;HTN; RAD; STROKE ; CABG X 3 LATE 50'S    Siblings: 1 BROTHER  A 52 IRREGULAR HEART// HAS M.S. Fransico Meadow ON DISABILITY    1 SISTER A 62 WIDOW    CV: +PARENTS; CABG BOTH ; DECEASED WITH MIS    HBP: + PARENTS    DM: + FATHER    GOUT/ARTHRITIS:    PROSTATE CANCER:    BREAST/OVARIAN/UTERINE CANCER: NEGATIVE    ETOH ABUSE: + FATHER    DRUG ABUSE: NEGATIVE    OTHER : STROKE + PARENTS   Risk Factors:  Passive smoke exposure:  yes Drug use:  no HIV high-risk behavior:  no Caffeine use:  2 drinks per day Alcohol use:  yes    Type:  beer    Drinks per day:  2    Has patient --       Felt need to cut down:  no       Been annoyed by complaints:  no       Felt guilty about drinking:  no       Needed eye opener in the morning:  no    Counseled to quit/cut down alcohol use:  no Exercise:  no Seatbelt use:  100 %   Review of Systems  General      Denies chills, fatigue, fever, loss of appetite, malaise, sleep disorder, sweats, weakness, and weight loss.  Eyes      Denies blurring, discharge, double vision, eye irritation, eye pain, halos, itching, light sensitivity, red eye, vision loss-1 eye, and vision loss-both eyes.  ENT      Denies decreased hearing, difficulty swallowing, ear discharge, earache, hoarseness, nasal  congestion, nosebleeds, postnasal drainage, ringing in ears, sinus pressure, and sore throat.  CV      Denies bluish discoloration of lips or nails, chest pain or discomfort, difficulty breathing at night, difficulty breathing while lying down, fainting, fatigue, leg cramps with exertion, lightheadness, near fainting, palpitations, shortness of breath with exertion, swelling of feet, swelling of hands, and weight gain.  Resp      Complains of shortness of breath.      Denies chest discomfort, chest pain with inspiration, cough, coughing up blood, excessive snoring, hypersomnolence, morning headaches, pleuritic, and sputum productive.      with steps  GI      Denies abdominal pain, bloody stools, change in bowel habits, constipation, dark tarry stools, diarrhea, excessive appetite, gas, hemorrhoids, indigestion, loss of appetite, nausea, vomiting, vomiting blood, and yellowish skin color.  GU      Complains of urinary frequency.  Denies decreased libido, discharge, dysuria, erectile dysfunction, genital sores, hematuria, incontinence, nocturia, and urinary hesitancy.  MS      Complains of stiffness.      Denies joint pain, joint redness, joint swelling, loss of strength, low back pain, mid back pain, muscle aches, muscle , cramps, muscle weakness, and thoracic pain.      throughout  Derm      Denies changes in color of skin, changes in nail beds, dryness, excessive perspiration, flushing, hair loss, insect bite(s), itching, lesion(s), poor wound healing, and rash.  Neuro      Denies brief paralysis, difficulty with concentration, disturbances in coordination, falling down, headaches, inability to speak, memory loss, numbness, poor balance, seizures, sensation of room spinning, tingling, tremors, visual disturbances, and weakness.   Physical Exam  General:     Well-developed,well-nourished,in no acute distress; alert,appropriate and cooperative throughout examination Head:      Normocephalic and atraumatic without obvious abnormalities. No apparent alopecia or balding. Eyes:     Conjunctiva clear bilaterally.  Ears:     External ear exam shows no significant lesions or deformities.  Otoscopic examination reveals clear canals, tympanic membranes are intact bilaterally without bulging, retraction, inflammation or discharge. Hearing is grossly normal bilaterally. Nose:     External nasal examination shows no deformity or inflammation. Nasal mucosa are pink and moist without lesions or exudates. Mouth:     Oral mucosa and oropharynx without lesions or exudates.  Teeth in good repair. Neck:     No deformities, masses, or tenderness noted. Chest Wall:     No deformities, masses, tenderness or gynecomastia noted. Breasts:     No masses or gynecomastia noted Lungs:     Normal respiratory effort, chest expands symmetrically. Lungs are clear to auscultation, no crackles or wheezes. Heart:     Normal rate and regular rhythm. S1 and S2 normal without gallop, murmur, click, rub or other extra sounds. Abdomen:     Bowel sounds positive,abdomen soft and non-tender without masses, organomegaly or hernias noted. Rectal:     No external abnormalities noted. Normal sphincter tone. No rectal masses or tenderness. G neg. Genitalia:     Testes bilaterally descended without nodularity, tenderness or masses. No scrotal masses or lesions. No penis lesions or urethral discharge. Prostate:     Prostate gland firm and smooth, no enlargement, nodularity, mass, asymmetry or induration. No discernible tenderness, 40gm prostate. Msk:     No deformity or scoliosis noted of thoracic or lumbar spine.   Pulses:     R and L carotid,radial,femoral,dorsalis pedis and posterior tibial pulses are full and equal bilaterally Extremities:     No clubbing, cyanosis, edema, or deformity noted with normal full range of motion of all joints.   Neurologic:     No cranial nerve deficits noted. Station and  gait are normal. Plantar reflexes are down-going bilaterally. DTRs are symmetrical throughout. Sensory, motor and coordinative functions appear intact. Skin:     Intact without suspicious lesions or rashes Cervical Nodes:     No lymphadenopathy noted Inguinal Nodes:     No significant adenopathy Psych:     Cognition and judgment appear intact. Alert and cooperative with normal attention span and concentration. No apparent delusions, illusions, hallucinations    Impression & Recommendations:  Problem # 1:  HEALTH MAINTENANCE EXAM (ICD-V70.0) Assessment: Comment Only  Problem # 2:  ACUTE PROSTATITIS (ICD-601.0) Not tender but acute change in PSA, will treat and then recheck.  Problem # 3:  HYPERGLYCEMIA (ICD-790.29) Assessment: Unchanged Continues, no worse. Will follow. Labs Reviewed: HgBA1c: 5.2 (05/24/2004)   Creat: 1.1 (08/10/2007)      Problem # 4:  DEGENERATIVE DISC DISEASE, CERVICAL SPINE (ICD-722.4) Assessment: Improved Stable.  Problem # 5:  HYPERTENSION (ICD-401.9) Assessment: Unchanged Slightly high today including by me. His updated medication list for this problem includes:    Lisinopril 10 Mg Tabs (Lisinopril) ..... One  tab by mouth once daily  BP today: 140/80 Prior BP: 152/96 (06/10/1996)  Labs Reviewed: Creat: 1.1 (08/10/2007) Chol: 175 (08/10/2007)   HDL: 80.0 (08/10/2007)   LDL: 87 (08/10/2007)   TG: 40 (08/10/2007)   Problem # 6:  HYPERLIPIDEMIA (ICD-272.4) Assessment: Improved Great nos...cont meds. His updated medication list for this problem includes:    Lipitor 10 Mg Tabs (Atorvastatin calcium)  Labs Reviewed: Chol: 175 (08/10/2007)   HDL: 80.0 (08/10/2007)   LDL: 87 (08/10/2007)   TG: 40 (08/10/2007) SGOT: 19 (08/10/2007)   SGPT: 27 (08/10/2007)   Problem # 7:  COPD (ICD-496) Assessment: Unchanged Encouraged to quit smoking. Vaccines Reviewed: Flu Vax: Fluvax 3+ (05/18/2005)   Complete Medication List: 1)  Lisinopril 10 Mg Tabs  (Lisinopril) .... One  tab by mouth once daily 2)  Lipitor 10 Mg Tabs (Atorvastatin calcium) 3)  Bayer Aspirin 325 Mg Tabs (Aspirin) .[name].. 1 daily 4)  Viagra 50 Mg Tabs (Sildenafil citrate) .... One hour prior to activity 5)  Cialis 20 Mg Tabs (Tadalafil) .... 1/2 tablet pior 6)  Cipro 500 Mg Tabs (Ciprofloxacin hcl) .... One tab by mouth two times a day   Patient Instructions: 1)  RTC 2 mmos, PSA prior 601.0    Prescriptions: CIPRO 500 MG  TABS (CIPROFLOXACIN HCL) one tab by mouth two times a day  #28 x 0   Entered and Authorized by:   Shaune Leeks MD   Signed by:   Shaune Leeks MD on 08/15/2007   Method used:   Print then Give to Patient   RxID:   [id number] VIAGRA 50 MG  TABS (SILDENAFIL CITRATE) ONE HOUR PRIOR TO ACTIVITY  #10 x 0   Entered by:   Providence Crosby   Authorized by:   Shaune Leeks MD   Signed by:   Providence Crosby on 08/15/2007   Method used:   Print then Give to Patient   RxID:   [id number] LIPITOR 10 MG  TABS (ATORVASTATIN CALCIUM)   #90 x 3   Entered by:   Providence Crosby   Authorized by:   Shaune Leeks MD   Signed by:   Providence Crosby on 08/15/2007   Method used:   Print then Give to Patient   RxID:   [id number] LISINOPRIL 10 MG TABS (LISINOPRIL) one  tab by mouth once daily  #90 x 3   Entered by:   Providence Crosby   Authorized by:   Shaune Leeks MD   Signed by:   Providence Crosby on 08/15/2007   Method used:   Print then Give to Patient   RxID:   [id number]  ]

## 2010-08-17 NOTE — Assessment & Plan Note (Signed)
Summary: CPX / LFW   Vital Signs:  Patient profile:   60 year old male Weight:      198.12 pounds Temp:     98.3 degrees F oral Pulse rate:   72 / minute Pulse rhythm:   regular BP sitting:   130 / 80 Cuff size:   large  Vitals Entered By: Sydell Axon LPN (2009-08-31 2:49 PM) CC: 30 Minute checkup, hemoccult cards given to patient   History of Present Illness: Pt here for Comp Exam.... he is doing well. He weighs the most he ever has. He has seen Dr Jones Broom and did ok on ETT, had quit smoking but is now back again. He is being asked to hav sleep study.  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 One  Tab By Mouth Once Daily 2)  Lipitor 10 Mg  Tabs (Atorvastatin Calcium) .[name].. 1 Tablet At Bedtime By Mouth 3)  Bayer Aspirin 325 Mg  Tabs (Aspirin) .[name].. 1 Daily 4)  Cialis 20 Mg  Tabs (Tadalafil) .... 1/2 Tablet Pior 5)  Symbicort 80-4.5 Mcg/act Aero  (Budesonide-Formoterol Fumarate) .... 2 Puffs Twice A Day As Needed  Allergies: No Known Drug Allergies  Past History:  Past Medical History: Last updated: 07/03/2009 Allergic rhinitis(:07/1998) COPD:(07/1998) with ongoing tobacco use Hyperlipidemia:(06/1996) Hypertension:(05/1998) h/oETOH abuse Back surgery 2007 Neck fusion 20003 Erectile dysfunction ETT/Myoview (2002)   --walked 12 mins. EF 61% diaphragmatic attenuation. no ischemia Echo EF 60-65%  Family History: Last updated: 08-31-2009 Father:DECEASED AT 64 : CAD, HTN; STROKE; DM; ETOH; CABG X 3 LATE 50"S  Mother: DECEASED 82 CAD ;HTN; RAD; STROKE ; CABG X 3 LATE 50'S BROTHER  A 54  IRREGULAR HEART   HAS M[employer]S. [employer] ON DISABILITY SISTER A 64  WIDOW  Pneumonia with virus...coma for 3 mos, hand, fingers and toes amputations CV: +PARENTS; CABG BOTH ; DECEASED WITH MIS HBP: + PARENTS DM: + FATHER GOUT/ARTHRITIS: PROSTATE CANCER: BREAST/OVARIAN/UTERINE CANCER: NEGATIVE ETOH ABUSE: + FATHER DRUG ABUSE: NEGATIVE OTHER : STROKE + PARENTS  Social History: Last updated: 11/06/2008 Marital Status: Married LIVES WITH WIFE Children: 1 SON / 2 DAUGHTERS Occupation: Event organiser Drinks 6-8 beers/day Tobacco 1 ppd x 30 yrs   Risk Factors: Alcohol Use: 2 (2009/08/31) Caffeine Use: 2 (August 31, 2009) Exercise: no (August 31, 2009)  Risk Factors: Smoking Status: current (31-Aug-2009) Packs/Day: 1/3 (Aug 31, 2009) Passive Smoke Exposure: yes (31-Aug-2009)  Past Surgical History: RECTAL FISSURE REPAIR  ([place]) LEFT KNEE ARTHROSCOPY WITH MENISECTOMY MEDIAL:(11/02/1999) NECK MRI DDD SPINAL CORD COMP. C3/4, C4/5, C5/6 , C6/7, HNP MULTIPLE:(07/31/2001) CARDIOLITE NORMAL(:12/25/2001) ANTERIOR CERVICAL DISEC. FUSION/ C3-4,C4-5, C5-6, C6-7:DRJENKINS):(06/26/2002) LAMINECTOMY L4/5:( DR. JENKINS):(03/14/2006) ETT Nml  12./2010  Family History: Father:DECEASED AT 40 : CAD, HTN; STROKE; DM; ETOH; CABG X 3 LATE 50"S  Mother:  DECEASED 67 CAD ;HTN; RAD; STROKE ; CABG X 3 LATE 50'S BROTHER  A 54  IRREGULAR HEART   HAS M[employer]S. [employer] ON DISABILITY SISTER A 64  WIDOW  Pneumonia with virus...coma for 3 mos, hand, fingers and toes amputations CV: +PARENTS; CABG BOTH ; DECEASED WITH MIS HBP: + PARENTS DM: + FATHER GOUT/ARTHRITIS: PROSTATE CANCER: BREAST/OVARIAN/UTERINE CANCER: NEGATIVE ETOH ABUSE: + FATHER DRUG ABUSE: NEGATIVE OTHER : STROKE + PARENTS  Social History: Packs/Day:  1/3  Review of Systems General:  Denies chills, fatigue, fever, loss of appetite, malaise, sleep disorder, sweats, weakness, and weight loss. Eyes:  Complains of discharge; denies blurring and eye pain; occas in right eye, watering. ENT:  Denies decreased hearing, ear discharge, earache, and ringing in ears. CV:  Denies chest pain or discomfort, fainting, fatigue, palpitations, and swelling of feet. Resp:  Complains of cough, shortness of breath, and wheezing; with smoking. GI:  Denies abdominal pain, bloody stools, change in bowel habits, constipation, dark tarry stools, diarrhea, indigestion, loss of appetite, nausea, vomiting, vomiting blood, and yellowish skin color. GU:  Complains of nocturia and urinary frequency; denies discharge and dysuria; since last year.. MS:  Denies joint pain, low back pain, mid back pain, muscle, cramps, muscle weakness, and stiffness. Derm:  Denies dryness, itching, and rash. Neuro:  Denies numbness, poor balance, tingling, and tremors.  Physical Exam  General:  Well-developed,well-nourished,in no acute distress; alert,appropriate and cooperative throughout examination. Head:  Normocephalic and atraumatic without obvious abnormalities. No apparent alopecia or balding. Sinuses NT. Eyes:  Conjunctiva clear bilaterally.  Ears:  External ear exam shows no significant lesions or deformities.  Otoscopic examination reveals clear canals, tympanic membranes are intact bilaterally without bulging, retraction,  inflammation or discharge. Hearing is grossly normal bilaterally. Cerumen bilat. Nose:  External nasal examination shows no deformity or inflammation. Nasal mucosa are pink and moist without lesions or exudates. Mouth:  Oral mucosa and oropharynx without lesions or exudates.  Teeth in good repair. Neck:  No deformities, masses, or tenderness noted. Chest Wall:  No deformities, masses, tenderness or gynecomastia noted. Breasts:  No masses or gynecomastia noted Lungs:  Normal respiratory effort, chest expands symmetrically. Lungs are clear to auscultation, no crackles or wheezes. Heart:  Normal rate and regular rhythm. S1 and S2 normal without gallop, murmur, click, rub or other extra sounds. Abdomen:  Bowel sounds positive,abdomen soft and non-tender without masses, organomegaly or hernias noted. Rectal:  No external abnormalities noted. Normal sphincter tone. No rectal masses or tenderness. Healed scar of rectal fissure felt dependedntly in the canal. G neg. Genitalia:  Testes bilaterally descended without nodularity, tenderness or masses. No scrotal masses or lesions. No penis lesions or urethral discharge. Prostate:  Prostate gland firm and smooth, no enlargement, nodularity, tenderness, mass, asymmetry or induration. 40-50 gms. Msk:  No deformity or scoliosis noted of thoracic or lumbar spine.   Pulses:  R and L carotid,radial,femoral,dorsalis pedis and posterior tibial pulses are full and equal bilaterally Extremities:  No clubbing, cyanosis, edema, or deformity noted with normal full range of motion of all joints.   Neurologic:  No cranial nerve deficits noted. Station  and gait are normal. Plantar reflexes are down-going bilaterally. DTRs are symmetrical throughout. Sensory, motor and coordinative functions appear intact. Skin:  Intact without suspicious lesions or rashes, Tatto posterior  right shoulder. Cervical Nodes:  No lymphadenopathy noted Inguinal Nodes:  No significant  adenopathy Psych:  Cognition and judgment appear intact. Alert and cooperative with normal attention span and concentration. No apparent delusions, illusions, hallucinations   Impression & Recommendations:  Problem # 1:  HEALTH MAINTENANCE EXAM (ICD-V70.0) Assessment Comment Only  Problem # 2:  TOBACCO USER (ICD-305.1) Assessment: Improved Discuseed. Worth quitting.  Problem # 3:  PREMATURE VENTRICULAR CONTRACTIONS (ICD-427.69) Assessment: Unchanged  Stable today. His updated medication list for this problem includes:    Bayer Aspirin 325 Mg Tabs (Aspirin) .[name]... 1 daily  Echocardiogram: 1. Left ventricle: The cavity size was normal. Systolic function was    normal. The estimated ejection fraction was in the range of 60%    to 65%. Wall motion was normal; there were no regional wall    motion abnormalities. Doppler parameters are consistent with    abnormal left ventricular relaxation (grade 1 diastolic    dysfunction). 2. Right atrium: The atrium was mildly dilated. (11/25/2008)  Labs Reviewed: Na: 142 (08/24/2009)   K+: 4.8 (08/24/2009)   CL: 105 (08/24/2009)   HCO3: 32 (08/24/2009) Ca: 9.6 (08/24/2009)   TSH: 1.77 (08/24/2009)   HCO3: 32 (08/24/2009)  Problem # 4:  SPECIAL SCREENING MALIGNANT NEOPLASM OF PROSTATE (ICD-V76.44) Assessment: Unchanged Stable PSA and exam.  Problem # 5:  HYPERGLYCEMIA (ICD-790.29) Assessment: Unchanged  Continues. Discussed avoiding sweets and carbs. Kidney fctn nml. Labs Reviewed: Creat: 1.0 (08/24/2009)     Problem # 6:  HYPERTENSION (ICD-401.9) Assessment: Unchanged Stable. His updated medication list for this problem includes:    Lisinopril 10 Mg Tabs (Lisinopril) ..... One  tab by mouth once daily  BP today: 130/80 Prior BP: 118/72 (07/03/2009)  Labs Reviewed: K+: 4.8 (08/24/2009) Creat: : 1.0 (08/24/2009)   Chol: 203 (08/24/2009)   HDL: 84.40 (08/24/2009)   LDL: 91 (08/19/2008)   TG: 51.0 (08/24/2009)  Problem # 7:   HYPERLIPIDEMIA (ICD-272.4) Assessment: Unchanged Avoid fatty foods to decrease LDL even more. His updated medication list for this problem includes:    Lipitor 10 Mg Tabs (Atorvastatin calcium) .[name]... 1 tablet at bedtime by mouth  Labs Reviewed: SGOT: 24 (08/24/2009)   SGPT: 29 (08/24/2009)   HDL:84.40 (08/24/2009), 83.3 (08/19/2008)  LDL:91 (08/19/2008), 87 (08/10/2007)  Chol:203 (08/24/2009), 185 (08/19/2008)  Trig:51.0 (08/24/2009), 56 (08/19/2008)  Problem # 8:  COPD (ICD-496) Assessment: Unchanged Avoid nicotine. His updated medication list for this problem includes:    Symbicort 80-4.5 Mcg/act Aero (Budesonide-formoterol fumarate) .[name]... 2 puffs twice a day as needed  Vaccines Reviewed: Flu Vax: Fluvax 3+ (05/18/2005)  Complete Medication List: 1)  Lisinopril 10 Mg Tabs (Lisinopril) .... One  tab by mouth once daily 2)  Lipitor 10 Mg Tabs (Atorvastatin calcium) .[name].. 1 tablet at bedtime by mouth 3)  Bayer Aspirin 325 Mg Tabs (Aspirin) .[name].. 1 daily 4)  Cialis 20 Mg Tabs (Tadalafil) .... 1/2 tablet pior 5)  Symbicort 80-4.5 Mcg/act Aero (Budesonide-formoterol fumarate) .... 2 puffs twice a day as needed  Patient Instructions: 1)  RTC one year, sooner as needed.  Current Allergies (reviewed today): No known allergies

## 2010-08-17 NOTE — Assessment & Plan Note (Signed)
Summary:  Cardiology   Primary Provider:  Shaune Leeks MD   History of Present Illness: 60 y/o male with HTN, HL and COPD with ongoing tobacco use. Very strong family history of CAD but no known h/o CAD. Referred for evlauation of skipped beats and failed flow meter test at work.  Has had 2 stress tests. Last myoview in 2003 prior to neck surgery. Did very well going 12 minutes on Burce protocol. Normal EF with fixed inferior defect thought to be diaphragmatic attenuation. No ischemia.  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 Able to push mow yard and shovel snow without problem.  No CP. Mild dyspnea if he pushes himself hard. Very heavy snoring and daughters notice that he wheezes at night.   Denies claudication. But does say he gets cramps in left leg at night.  Was at work yesterday and nurse noticed skipped beats and he failed flow meter tests so daughters brought him for evaluation.     Allergies: No Known Drug Allergies  Past History:  Past Medical History:    Allergic rhinitis(:07/1998)    COPD:(07/1998) with ongoing tobacco use    Hyperlipidemia:(06/1996)    Hypertension:(05/1998)    ETOH abuse    Back surgery 2007    Neck fusion 20003    Erectile dysfunction    ETT/Myoview      --walked 12 mins. EF 61% diaphragmatic attenuation. no ischemia  Social History:    Marital Status: Married LIVES WITH WIFE    Children: 1 SON / 2 DAUGHTERS    Occupation: Event organiser    Drinks 6-8 beers/day    Tobacco 1 ppd x 30 yrs   Review of Systems       As per HPI and past medical history; otherwise all systems negative.   Vital Signs:  Patient profile:   60 year old male Weight:      189 pounds Pulse rate:   78 / minute BP sitting:   144 / 62  (left arm)  Physical Exam  General:  Gen: well appearing. no resp difficulty HEENT: normal Neck: thick. supple. no JVD. Carotids 2+ bilat; not bruits. No lymphadenopathy or thryomegaly appreciated.  Cor: PMI nondisplaced. Regular rate & rhythm. No rubs, murmur. +s4 Lungs: clear with poor air movement. +wheezing Abdomen: obeses soft, nontender, nondistended. No hepatosplenomegaly. No bruits or masses. Good bowel sounds. ? soft bruit Extremities: no cyanosis, clubbing, rash, edema DP2+ R 1+ R Neuro: alert & orientedx3, cranial nerves grossly intact. moves all 4 extremities w/o difficulty. affect pleasant    Impression & Recommendations:  Problem # 1:  DYSPNEA (ICD-786.05) He has evidence of severe COPD on exan. Will get PFTs with [facility] and will likely need pulmonary referral for initiation of inhalers (?Symbicort) . WIll likely need repeat stress test at f/u. Counseled on need to start smoking. He will start with nicotine gum.   Problem # 2:  PREMATURE VENTRICULAR CONTRACTIONS (ICD-427.69) Suspect these are benign will check ECHO to make sure heart is structurally normal. May be related to OSA.  Problem # 3:  PVD (ICD-443.9) Significant PVD on exam with ? abdominal bruit. Wiill get ab u/s and ABIs. Needs to stop smoking.  Problem # 4:  SNORING (ICD-786.09) Discussed possibility of OSA at length. WIll need sleep study soon.  Problem # 5:  TOBACCO USER (ICD-305.1) Counselled on need to quit. Will start nicotine gum. Not interested in zyban or chantix at this point.

## 2010-08-17 NOTE — Assessment & Plan Note (Signed)
Summary: 2 m f/u  dlo   Vital Signs:  Patient Profile:   60 Years Old Male Height:     66 inches (170.18 cm) Weight:      183 pounds Temp:     98.5 degrees F oral Pulse rate:   76 / minute BP sitting:   140 / 80  (left arm)                 Chief Complaint:  2 Month f/u.  History of Present Illness: Here for f/u of differentlly elevated PSA (gtr than 10% change). Was treated with AB and feels better (had nonspecific tiredness and just didn't feel right.) Doing well now with no complaints.    Current Allergies: No known allergies       Physical Exam  General:     Well-developed,well-nourished,in no acute distress; alert,appropriate and cooperative throughout examination Head:     Normocephalic and atraumatic without obvious abnormalities. No apparent alopecia or balding. Eyes:     Conjunctiva clear bilaterally.  Ears:     External ear exam shows no significant lesions or deformities.  Otoscopic examination reveals clear canals, tympanic membranes are intact bilaterally without bulging, retraction, inflammation or discharge. Hearing is grossly normal bilaterally. Nose:     External nasal examination shows no deformity or inflammation. Nasal mucosa are pink and moist without lesions or exudates. Mouth:     Oral mucosa and oropharynx without lesions or exudates.  Teeth in good repair. Neck:     No deformities, masses, or tenderness noted. Chest Wall:     No deformities, masses, tenderness or gynecomastia noted. Lungs:     Normal respiratory effort, chest expands symmetrically. Lungs are clear to auscultation, no crackles or wheezes. Heart:     Normal rate and regular rhythm. S1 and S2 normal without gallop, murmur, click, rub or other extra sounds. Prostate:     Pt declined.    Impression & Recommendations:  Problem # 1:  ACUTE PROSTATITIS (ICD-601.0) Assessment: Improved PSA back to normal. RTC as needed.  Problem # 2:  HYPERTENSION (ICD-401.9)  Assessment: Unchanged Stable, want it lower. His updated medication list for this problem includes:    Lisinopril 10 Mg Tabs (Lisinopril) ..... One  tab by mouth once daily  BP today: 140/80 Prior BP: 140/80 (08/15/2007)  Labs Reviewed: Creat: 1.1 (08/10/2007) Chol: 175 (08/10/2007)   HDL: 80.0 (08/10/2007)   LDL: 87 (08/10/2007)   TG: 40 (08/10/2007)   Complete Medication List: 1)  Lisinopril 10 Mg Tabs (Lisinopril) .... One  tab by mouth once daily 2)  Lipitor 10 Mg Tabs (Atorvastatin calcium) 3)  Bayer Aspirin 325 Mg Tabs (Aspirin) .[name].. 1 daily 4)  Viagra 50 Mg Tabs (Sildenafil citrate) .... One hour prior to activity 5)  Cialis 20 Mg Tabs (Tadalafil) .... 1/2 tablet pior 6)  Cipro 500 Mg Tabs (Ciprofloxacin hcl) .... One tab by mouth two times a day   Patient Instructions: 1)  RTC 1/10 for Comp Exam, labs prior.    ]

## 2010-08-17 NOTE — Letter (Signed)
Summary: Results Follow up Letter  [facility]  [address] [place], [place] [address]   Phone: [phone]  Fax: [phone]    09/09/2008 MRN: [id number]  [name] [address] [place], [place]  [address]  Dear [name],  The following are the results of your recent test(s):  Test         Result    Pap Smear:        Normal _____  Not Normal _____ Comments: ______________________________________________________ Cholesterol: LDL(Bad cholesterol):         Your goal is less than:         HDL (Good cholesterol):       Your goal is more than: Comments:  ______________________________________________________ Mammogram:        Normal _____  Not Normal _____ Comments:  ___________________________________________________________________ Hemoccult:        Normal _x____  Not normal _______ Comments:  No blood in stool. Thank you for returning the hemoccult cards. Please make sure to repeat in one year.  _____________________________________________________________________ Other Tests:    We routinely do not discuss normal results over the telephone.  If you desire a copy of the results, or you have any questions about this information we can discuss them at your next office visit.   Sincerely,

## 2010-08-17 NOTE — Progress Notes (Signed)
Summary: Surgical clearance  Phone Note Call from Patient Call back at Home Phone [phone]   Caller: Daughter/Crystal Call For: Shaune Leeks MD Summary of Call: Patient's daughter called and wanted to know if Dr. Hetty Ely could write her dad a letter for surgical clearance on his shoulder.  Dr. Debby Bud will be doing the surgery at [facility] but no surgery date has been set as of right now.  Please advise. Initial call taken by: Linde Gillis CMA Duncan Dull),  November 12, 2009 3:24 PM  Follow-up for Phone Call        Needs to quit smoking for surgery in order to heal properly. Should have lost some weight by now. If he meets these two criteria, I will clear for surgery with Dr Bensihmon's clearance (which I noted has been requested.) Follow-up by: Shaune Leeks MD,  November 12, 2009 4:01 PM  Additional Follow-up for Phone Call Additional follow up Details #1::        Spoke with patient's daughter again and she says that her dad has not stopped smoking.  She wanted to know if he has been offered Chantix or any type of smoking cessasation counseling, etc.  He walks about two times a week but has not lost a lot of weight.  Please advise. . Additional Follow-up by: Linde Gillis CMA Duncan Dull),  November 12, 2009 4:29 PM    Additional Follow-up for Phone Call Additional follow up Details #2::    He's been offered anytrhing he wants for smoking cessation. In my mind it makes no sense getting surgery while smoking. Healing has been shown to be affected Shaune Leeks MD  November 12, 2009 5:03 PM   Advised patient's daughter as instructed above.  Dr. Hetty Ely advised that patient should make an appt to discuss this.  Daughter said that she would advise him of this.  Linde Gillis CMA Duncan Dull)  November 12, 2009 5:06 PM

## 2010-08-17 NOTE — Assessment & Plan Note (Signed)
Summary: cpx/rbh   Vital Signs:  Patient Profile:   60 Years Old Male Height:     66 inches (170.18 cm) Weight:      190 pounds Temp:     98.2 degrees F oral Pulse rate:   76 / minute Pulse rhythm:   regular BP sitting:   154 / 80  (left arm) Cuff size:   regular  Vitals Entered By: Providence Crosby (August 21, 2008 8:22 AM)                 Chief Complaint:  check up// hemoccult cards to patient.  History of Present Illness: Pt here for Comp Exam    Prior Medications Reviewed Using: Patient Recall  Current Allergies: No known allergies    Family History:    Father:DECEASED AT 28 : CAD, HTN; STROKE; DM; ETOH; CABG X 3 LATE 50"S     Mother: DECEASED 54 CAD ;HTN; RAD; STROKE ; CABG X 3 LATE 50'S    BROTHER  A 53  IRREGULAR HEART   HAS M.S. Fransico Meadow ON DISABILITY    SISTER A 63  WIDOW  Pneumonia with virus...coma for 3 mos, hand, fingers and toes amputations    CV: +PARENTS; CABG BOTH ; DECEASED WITH MIS    HBP: + PARENTS    DM: + FATHER    GOUT/ARTHRITIS:    PROSTATE CANCER:    BREAST/OVARIAN/UTERINE CANCER: NEGATIVE    ETOH ABUSE: + FATHER    DRUG ABUSE: NEGATIVE    OTHER : STROKE + PARENTS   Risk Factors:     Year started:  75    Counseled to quit/cut down tobacco use:  yes    Counseled to quit/cut down alcohol use:  yes   Review of Systems  General      Complains of fatigue.      Denies chills, fever, loss of appetite, malaise, sleep disorder, sweats, weakness, and weight loss.      presumed fatigue due to weight gain  Eyes      Complains of blurring.      Denies discharge, double vision, eye irritation, eye pain, halos, itching, light sensitivity, red eye, vision loss-1 eye, and vision loss-both eyes.      script increased  ENT      Complains of ringing in ears.      Denies decreased hearing, difficulty swallowing, ear discharge, earache, hoarseness, nasal congestion, nosebleeds, postnasal drainage, sinus pressure, and sore throat.      chronic  L>R  Resp      Complains of cough.      Denies chest discomfort, chest pain with inspiration, coughing up blood, excessive snoring, hypersomnolence, morning headaches, pleuritic, shortness of breath, sputum productive, and wheezing.      due to smoking   GI      Denies abdominal pain, bloody stools, change in bowel habits, constipation, dark tarry stools, diarrhea, excessive appetite, gas, hemorrhoids, indigestion, loss of appetite, nausea, vomiting, vomiting blood, and yellowish skin color.  GU      Complains of nocturia and urinary frequency.      Denies decreased libido, discharge, dysuria, erectile dysfunction, genital sores, hematuria, incontinence, and urinary hesitancy.      twice  MS      Denies joint pain, joint redness, joint swelling, loss of strength, low back pain, mid back pain, muscle aches, muscle , cramps, muscle weakness, stiffness, and thoracic pain.  Derm      Denies changes in color of skin,  changes in nail beds, dryness, excessive perspiration, flushing, hair loss, insect bite(s), itching, lesion(s), poor wound healing, and rash.  Neuro      Denies brief paralysis, difficulty with concentration, disturbances in coordination, falling down, headaches, inability to speak, memory loss, numbness, poor balance, seizures, sensation of room spinning, tingling, tremors, visual disturbances, and weakness.   Physical Exam  General:     Well-developed,well-nourished,in no acute distress; alert,appropriate and cooperative throughout examination Head:     Normocephalic and atraumatic without obvious abnormalities. No apparent alopecia or balding. Eyes:     Conjunctiva clear bilaterally.  Ears:     External ear exam shows no significant lesions or deformities.  Otoscopic examination reveals clear canals, tympanic membranes are intact bilaterally without bulging, retraction, inflammation or discharge. Hearing is grossly normal bilaterally. Nose:     External nasal  examination shows no deformity or inflammation. Nasal mucosa are pink and moist without lesions or exudates. Mouth:     Oral mucosa and oropharynx without lesions or exudates.  Teeth in good repair. Neck:     No deformities, masses, or tenderness noted. Chest Wall:     No deformities, masses, tenderness or gynecomastia noted. Breasts:     No masses or gynecomastia noted Lungs:     Normal respiratory effort, chest expands symmetrically. Lungs are clear to auscultation, no crackles or wheezes. Heart:     Normal rate and regular rhythm. S1 and S2 normal without gallop, murmur, click, rub or other extra sounds. Abdomen:     Bowel sounds positive,abdomen soft and non-tender without masses, organomegaly or hernias noted. Rectal:     No external abnormalities noted. Normal sphincter tone. No rectal masses or tenderness. G neg. Genitalia:     Testes bilaterally descended without nodularity, tenderness or masses. No scrotal masses or lesions. No penis lesions or urethral discharge. Prostate:     Prostate gland firm and smooth, no enlargement, nodularity, tenderness, mass, asymmetry or induration. 40-50 gms. Msk:     No deformity or scoliosis noted of thoracic or lumbar spine.   Pulses:     R and L carotid,radial,femoral,dorsalis pedis and posterior tibial pulses are full and equal bilaterally Extremities:     No clubbing, cyanosis, edema, or deformity noted with normal full range of motion of all joints.   Neurologic:     No cranial nerve deficits noted. Station and gait are normal. Plantar reflexes are down-going bilaterally. DTRs are symmetrical throughout. Sensory, motor and coordinative functions appear intact. Skin:     Intact without suspicious lesions or rashes Cervical Nodes:     No lymphadenopathy noted Inguinal Nodes:     No significant adenopathy Psych:     Cognition and judgment appear intact. Alert and cooperative with normal attention span and concentration. No apparent  delusions, illusions, hallucinations    Impression & Recommendations:  Problem # 1:  HEALTH MAINTENANCE EXAM (ICD-V70.0) Assessment: Comment Only  Problem # 2:  HYPERTENSION (ICD-401.9) Assessment: Unchanged Mldly elevated, weight loss will help. Will adjust next time. His updated medication list for this problem includes:    Lisinopril 10 Mg Tabs (Lisinopril) ..... One  tab by mouth once daily  BP today: 154/80 Prior BP: 140/80 (10/15/2007)  Labs Reviewed: Creat: 0.9 (08/19/2008) Chol: 185 (08/19/2008)   HDL: 83.3 (08/19/2008)   LDL: 91 (08/19/2008)   TG: 56 (08/19/2008)   Problem # 3:  SPECIAL SCREENING MALIGNANT NEOPLASM OF PROSTATE (ICD-V76.44) Assessment: Unchanged Stable PSA and exam. H/o prostatitis resolved.  Problem # 4:  HYPERGLYCEMIA (ICD-790.29) Assessment: Improved Euglycemic today. Labs Reviewed: HgBA1c: 5.2 (05/24/2004)   Creat: 0.9 (08/19/2008)   Microalbumin: 0.7 (08/19/2008)   Problem # 5:  DEGENERATIVE DISC DISEASE, CERVICAL SPINE (ICD-722.4) Assessment: Unchanged Stable.  Problem # 6:  HYPERLIPIDEMIA (ICD-272.4) Assessment: Unchanged Good control on Lipitor. Continue. His updated medication list for this problem includes:    Lipitor 10 Mg Tabs (Atorvastatin calcium) .[name]... 1 tablet at bedtime by mouth  Labs Reviewed: Chol: 185 (08/19/2008)   HDL: 83.3 (08/19/2008)   LDL: 91 (08/19/2008)   TG: 56 (08/19/2008) SGOT: 20 (08/19/2008)   SGPT: 23 (08/19/2008)   Problem # 7:  COPD (ICD-496) Assessment: Unchanged Encouraged to quit smoking. Vaccines Reviewed: Flu Vax: Fluvax 3+ (05/18/2005)   Problem # 8:  ALLERGIC RHINITIS (ICD-477.9) Assessment: Unchanged Stable.  Complete Medication List: 1)  Lisinopril 10 Mg Tabs (Lisinopril) .... One  tab by mouth once daily 2)  Lipitor 10 Mg Tabs (Atorvastatin calcium) .[name].. 1 tablet at bedtime by mouth 3)  Bayer Aspirin 325 Mg Tabs (Aspirin) .[name].. 1 daily 4)  Cialis 20 Mg Tabs (Tadalafil) .... 1/2 tablet  pior   Patient Instructions: 1)  RTC 6 mos, recheck BP

## 2010-08-17 NOTE — Letter (Signed)
Summary: Nadara Eaton letter  [place] [facility]  [address] [place], [place] [address]   Phone: [phone]  Fax: [phone]       02/23/2010 MRN: [id number]  SURAJ RAMDASS [address] [place], [place]  [address]  Dear Mr. SEDALE, [facility] [place], and [facility] announce the retirement of Arta Silence, M.D., from full-time practice at the [facility] office effective January 14, 2010 and his plans of returning part-time.  It is important to Dr. Hetty Ely and to our practice that you understand that [facility] has seven physicians in our office for your health care needs.  We will continue to offer the same exceptional care that you have today.    Dr. Hetty Ely has spoken to many of you about his plans for retirement and returning part-time in the fall.   We will continue to work with you through the transition to schedule appointments for you in the office and meet the high standards that [facility] is committed to.   Again, it is with great pleasure that we share the news that Dr. Hetty Ely will return to [facility] in October of 2011 with a reduced schedule.    If you have any questions, or would like to request an appointment with one of our physicians, please call us at [phone] and press the option for Scheduling an appointment.  We take pleasure in providing you with excellent patient care and look forward to seeing you at your next office visit.  Our [facility] Physicians are:  Tillman Abide, M.D. Laurita Quint, M.D. Roxy Manns, M.D. Kerby Nora, M.D. Hannah Beat, M.D. Ruthe Mannan, M.D. We proudly welcomed Raechel Ache, M.D. and Eustaquio Boyden, M.D. to the practice in July/August 2011.  Sincerely,  [facility]

## 2010-08-17 NOTE — Assessment & Plan Note (Signed)
Summary: discuss surgery on shoulder   Vital Signs:  Patient profile:   60 year old male Weight:      196.75 pounds BMI:     31.87 Temp:     98.6 degrees F oral Pulse rate:   60 / minute Pulse rhythm:   regular BP sitting:   124 / 70 Cuff size:   large  Vitals Entered By: Sydell Axon LPN (Nov 17, 8313 3:53 PM) CC: Discuss surgery on shoulder   History of Present Illness: Pt here referred by Dr Ranell Patrick for surgical clearance for right shoulder surgery. He has had problems with  his shoulder off and on for quite some time. He especially has trouble with aterior elevation of the wrist and arm while straight. His pain is global, deep in the joint. He is still smoking, understands the risks of poor healing and is agreeable to try quitting. We discussed Chantix. He has heard about it and is willing to try.  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 One  Tab By  Mouth Once Daily 2)  Lipitor 10 Mg  Tabs (Atorvastatin Calcium) .[name].. 1 Tablet At Bedtime By Mouth 3)  Bayer Aspirin 325 Mg  Tabs (Aspirin) .[name].. 1 Daily 4)  Cialis 20 Mg  Tabs (Tadalafil) .... 1/2 Tablet Pior 5)  Symbicort 80-4.5 Mcg/act Aero (Budesonide-Formoterol Fumarate) .... 2 Puffs Twice A Day As Needed  Allergies: No Known Drug Allergies  Past History:  Past Medical History: Last updated: 07/03/2009 Allergic rhinitis(:07/1998) COPD:(07/1998) with ongoing tobacco use Hyperlipidemia:(06/1996) Hypertension:(05/1998) h/oETOH abuse Back surgery 2007 Neck fusion 20003 Erectile dysfunction ETT/Myoview (2002)   --walked 12 mins. EF 61% diaphragmatic attenuation. no ischemia Echo EF 60-65%  Past Surgical History: Last updated: 09-20-2009 RECTAL FISSURE REPAIR  ([place]) LEFT KNEE ARTHROSCOPY WITH MENISECTOMY MEDIAL:(11/02/1999) NECK MRI DDD SPINAL CORD COMP. C3/4, C4/5, C5/6 , C6/7, HNP MULTIPLE:(07/31/2001) CARDIOLITE NORMAL(:12/25/2001) ANTERIOR CERVICAL DISEC. FUSION/ C3-4,C4-5, C5-6, C6-7:DRJENKINS):(06/26/2002) LAMINECTOMY L4/5:( DR. JENKINS):(03/14/2006) ETT Nml  12./2010  Family History: Last updated: 20-Sep-2009 Father:DECEASED AT 64 : CAD, HTN; STROKE; DM; ETOH; CABG X 3 LATE 50"S  Mother: DECEASED 44 CAD ;HTN; RAD; STROKE ; CABG X 3 LATE 50'S BROTHER  A 54  IRREGULAR HEART   HAS M.S. Fransico Meadow ON DISABILITY SISTER A 64  WIDOW  Pneumonia with virus...coma for 3 mos, hand, fingers and toes amputations CV: +PARENTS; CABG BOTH ;  DECEASED WITH MIS HBP: + PARENTS DM: + FATHER GOUT/ARTHRITIS: PROSTATE CANCER: BREAST/OVARIAN/UTERINE CANCER: NEGATIVE ETOH ABUSE: + FATHER DRUG ABUSE: NEGATIVE OTHER : STROKE + PARENTS  Social History: Last updated: 11/06/2008 Marital Status: Married LIVES WITH WIFE Children: 1 SON / 2 DAUGHTERS Occupation: Event organiser Drinks 6-8 beers/day Tobacco 1 ppd x 30 yrs   Risk Factors: Alcohol Use: 2 (11/17/2009) Caffeine Use: 2  (11/17/2009) Exercise: no (11/17/2009)  Risk Factors: Smoking Status: current (11/17/2009) Packs/Day: 1/3 (11/17/2009) Passive Smoke Exposure: yes (11/17/2009)  Review of Systems General:  Denies chills, fatigue, fever, sweats, weakness, and weight loss. Eyes:  Denies blurring, discharge, and eye pain; has discharge occas in right eye.. ENT:  Denies decreased hearing, ear discharge, earache, and ringing in ears. CV:  Complains of shortness of breath with exertion; denies chest pain or discomfort, fainting, fatigue, palpitations, swelling of feet, and swelling of hands. Resp:  Complains of cough, shortness of breath, and wheezing; smoking today...hopes to quit. GI:  Denies abdominal pain, bloody stools, change in bowel habits, constipation, dark tarry stools, diarrhea, indigestion, loss of appetite, nausea, vomiting, vomiting blood, and yellowish skin color. GU:  Complains of urinary frequency; denies dysuria and nocturia. MS:  Complains of joint pain; denies low back pain, muscle aches, cramps, and stiffness; r shoulder. Derm:  Denies dryness, itching, and rash. Neuro:  Denies numbness, poor balance, tingling, and tremors.  Physical Exam  General:  Well-developed,well-nourished,in no acute distress; alert,appropriate and cooperative throughout examination. Head:  Normocephalic and atraumatic without obvious abnormalities. No apparent alopecia or balding. Sinuses NT. Eyes:  Conjunctiva clear bilaterally.  Ears:  External ear exam shows no significant lesions or deformities.  Otoscopic examination reveals clear canals, tympanic membranes are intact bilaterally without bulging, retraction, inflammation or discharge. Hearing is grossly normal bilaterally. Cerumen bilat. Nose:  External nasal examination shows no deformity or inflammation. Nasal mucosa are pink and moist without lesions or exudates. Mouth:  Oral mucosa and oropharynx without lesions or exudates.  Teeth in good repair. Neck:   No deformities, masses, or tenderness noted. Chest Wall:  No deformities, masses, tenderness or gynecomastia noted. Breasts:  No masses or gynecomastia noted Lungs:  Normal respiratory effort, chest expands symmetrically. Lungs are clear to auscultation, no crackles or wheezes. Heart:  Normal rate and regular rhythm. S1 and S2 normal without gallop, murmur, click, rub or other extra sounds. Abdomen:  Bowel sounds positive,abdomen soft and non-tender without masses, organomegaly or hernias noted. Rectal:  Done in Feb...nml. G neg. Genitalia:  Done in Feb...nml Prostate:  Done in Feb...nml, 40-50gms. Msk:  No deformity or scoliosis noted of thoracic or lumbar spine.   Pulses:  R and L carotid,radial,femoral,dorsalis pedis and posterior tibial pulses are full and equal bilaterally Extremities:  No clubbing, cyanosis, edema, or deformity noted with normal full range of motion of all joints except right shoulder limited by discomfort actively  and resisted in multiple directions..   Neurologic:  No cranial nerve deficits noted. Station and gait are normal. Sensory, motor and coordinative functions appear intact. Skin:  Intact without suspicious lesions or rashes, Tatto posterior  right shoulder. Cervical Nodes:  No lymphadenopathy noted Inguinal Nodes:  No significant adenopathy Psych:  Cognition and judgment appear intact. Alert and cooperative with normal attention span and concentration. No apparent delusions, illusions, hallucinations   Impression & Recommendations:  Problem # 1:  PREOPERATIVE EXAMINATION (ICD-V72.84) Assessment New Pt cleared for surgery pending quitting smoking and cardiac clearance by Dr Gala Romney.  Problem # 2:  TOBACCO USER (ICD-305.1) Assessment: Unchanged Discussed at length. Pt amenable to trying Chantix to help quit. Scripts sent in. Disussed tapering up and tapering off. His updated medication list for this problem includes:    Chantix Starting Month Pak 0.5 Mg  X 11 & 1 Mg X 42 Tabs (Varenicline tartrate) .[name]... As dir    Chantix Continuing Month Pak 1 Mg Tabs (Varenicline tartrate) ..... One tb by mouth two times a day  Problem # 3:  PREMATURE VENTRICULAR CONTRACTIONS (ICD-427.69) Assessment: Unchanged Has minimal awareness. Needs clearance by Cardiology. His updated medication list for this problem includes:    Bayer Aspirin 325 Mg Tabs (Aspirin) .[name]... 1 daily  Problem # 4:  DYSPNEA (ICD-786.05) Assessment: Unchanged Stable. No real problem with breathing.  Problem # 5:  HYPERGLYCEMIA (ICD-790.29) Stable. FBS 107 when checked in Feb. Labs Reviewed: Creat: 1.0 (08/24/2009)     Problem # 6:  HEPATITIS C, HX OF (ICD-V12.09) Assessment: Unchanged LFTs nml for last few years.  Resiolved per Dr Kinnie Scales 03/05/1999, spontaneous resolution.  Problem # 7:  HYPERTENSION (ICD-401.9) Assessment: Unchanged Stable. His updated medication list for this problem includes:    Lisinopril 10 Mg Tabs (Lisinopril) ..... One  tab by mouth once daily  BP today: 124/70 Prior BP: 130/80 (08/27/2009)  Labs Reviewed: K+: 4.8 (08/24/2009) Creat: : 1.0 (08/24/2009)   Chol: 203 (08/24/2009)   HDL: 84.40 (08/24/2009)   LDL: 91 (08/19/2008)   TG: 51.0 (08/24/2009)  Problem # 8:  HYPERLIPIDEMIA (ICD-272.4) Assessment: Unchanged Stable. LDL acceptqble but trty to get lower by avoiding fatty foods. His updated medication list for this problem includes:    Lipitor 10 Mg Tabs (Atorvastatin calcium) .[name]... 1 tablet at bedtime by mouth  Labs Reviewed: SGOT: 24 (08/24/2009)   SGPT: 29 (08/24/2009)   HDL:84.40 (08/24/2009), 83.3 (08/19/2008)  LDL:91 (08/19/2008), 87 (08/10/2007)  Chol:203 (08/24/2009), 185 (08/19/2008)  Trig:51.0 (08/24/2009), 56 (08/19/2008)  Complete Medication List: 1)  Lisinopril 10 Mg Tabs (Lisinopril) .... One  tab by mouth once daily 2)  Lipitor 10 Mg Tabs (Atorvastatin calcium) .[name].. 1 tablet at bedtime by mouth 3)  Bayer Aspirin 325 Mg Tabs  (Aspirin) .[name].. 1 daily 4)  Cialis 20 Mg Tabs (Tadalafil) .... 1/2 tablet pior 5)  Symbicort 80-4.5 Mcg/act Aero (Budesonide-formoterol fumarate) .... 2 puffs twice a day as needed 6)  Vitamin B and D  .... Daily 7)  Chantix Starting Month Pak 0.5 Mg X 11 & 1 Mg X 42 Tabs (Varenicline tartrate) .... As dir 8)  Chantix Continuing Month Pak 1 Mg Tabs (Varenicline tartrate) .... One tb by mouth two times a day  Patient Instructions: 1)  Cleared for surgery pending quitting smoking and clearance by Dr Gala Romney. 2)  RTC as needed. 3)  Come in for any problems with Chantix. Prescriptions: CHANTIX CONTINUING MONTH PAK 1 MG TABS (VARENICLINE TARTRATE) one tb by mouth two times a day  #1 pak or 60 x 5   Entered and Authorized by:   Shaune Leeks MD   Signed by:   Shaune Leeks MD on 11/17/2009   Method used:   Electronically to        [facility] [address] (retail)       [address], [address]       [place], [place]  [address]       Ph: [phone]       Fax: [phone]   RxID:  [id number] CHANTIX STARTING MONTH PAK 0.5 MG X 11 & 1 MG X 42 TABS (VARENICLINE TARTRATE) as dir  #1 pak x 0   Entered and Authorized by:   Shaune Leeks MD   Signed by:   Shaune Leeks MD on 11/17/2009   Method used:   Electronically to        [facility] [address] (retail)       [address], [address]       [place], [place]  [address]       Ph: [phone]       Fax: [phone]   RxID:   [id number]   Current Allergies (reviewed today): No known allergies

## 2010-08-17 NOTE — Letter (Signed)
Summary: Surgical Clearance  Surgical Clearance   Imported By: Harlon Flor 11/30/2009 08:00:53  _____________________________________________________________________  External Attachment:    Type:   Image     Comment:   External Document

## 2010-08-17 NOTE — Progress Notes (Signed)
Summary: SURICAL CLEARANCE  Phone Note Call from Patient Call back at Home Phone [phone] Call back at [phone]   Caller: Daughter Crystal Call For: Elliott Quade Summary of Call: NEEDS A LETTER OF CLEARANCE FOR SHOULDER SCOPE SURGERY FOR [facility]-STEVE NORRIS IS THE SURGEON-SURGERY HAS NOT BEEN SCHEDULED YET-WAITING FOR THE MEDICAL CLEARANCE Initial call taken by: Harlon Flor,  November 12, 2009 3:15 PM  Follow-up for Phone Call        Please route these messages through the nurse. At last office visit recommended a stress test but I can't find results in computer. Did patient have it at [facility]? If not, need to schedule treadmill prior to surgery. Dolores Patty, MD, [facility]  Nov 16, 2009 5:28 PM   Additional Follow-up for Phone Call Additional follow up Details #1::        Pt had treadmill  test done at [facility] on 07/09/09.  Scanned into EMR under external other.  Treadmill was normal, slow HR recovery.  Please advise.   Additional Follow-up by: Cloyde Reams RN,  Nov 18, 2009 11:09 AM    Additional Follow-up for Phone Call Additional follow up Details #2::    ok to proceed with surgery. Dolores Patty, MD, [facility]  Nov 18, 2009 11:11 PM   Additional Follow-up for Phone Call Additional follow up Details #3:: Details for Additional Follow-up Action Taken: Called spoke with Crystal, pt's daughter advised OK per Dr Gala Romney to proceed with surgery.  Will fax a copy of this clearance to Dr Ranell Patrick office at [facility].  Additional Follow-up by: Cloyde Reams RN,  Nov 19, 2009 9:52 AM

## 2010-08-17 NOTE — Progress Notes (Signed)
Summary: rx  Phone Note From Pharmacy Call back at [phone]   Caller: [facility] Call For: schaller  Summary of Call: lisinopril 10mg  1 once daily  # 30 last filled 4/24//08 Initial call taken by: Liane Comber,  Dec 12, 2006 11:06 AM  Follow-up for Phone Call        done. Follow-up by: Shaune Leeks MD,  Dec 12, 2006 2:04 PM  Additional Follow-up for Phone Call Additional follow up Details #1::        rx called in ..................................................................[name] KitchenMarcelle Smiling Jaretssi Kraker  Dec 12, 2006 2:09 PM   New/Updated Medications: LISINOPRIL 10 MG TABS (LISINOPRIL) one  tab by mouth once daily  New/Updated Medications: LISINOPRIL 10 MG TABS (LISINOPRIL) one  tab by mouth once daily  Prescriptions: LISINOPRIL 10 MG TABS (LISINOPRIL) one  tab by mouth once daily  #30 x prn   Entered and Authorized by:   Shaune Leeks MD   Signed by:   Shaune Leeks MD on 12/12/2006   Method used:   Print then Give to Patient   RxID:   [id number]

## 2010-08-19 ENCOUNTER — Ambulatory Visit: Admit: 2010-08-19 | Payer: Self-pay | Admitting: Family Medicine

## 2010-08-19 ENCOUNTER — Other Ambulatory Visit (INDEPENDENT_AMBULATORY_CARE_PROVIDER_SITE_OTHER): Payer: BC Managed Care – PPO

## 2010-08-19 ENCOUNTER — Encounter (INDEPENDENT_AMBULATORY_CARE_PROVIDER_SITE_OTHER): Payer: Self-pay | Admitting: *Deleted

## 2010-08-19 ENCOUNTER — Other Ambulatory Visit: Payer: Self-pay | Admitting: Family Medicine

## 2010-08-19 ENCOUNTER — Encounter: Payer: Self-pay | Admitting: Family Medicine

## 2010-08-19 DIAGNOSIS — R7309 Other abnormal glucose: Secondary | ICD-10-CM

## 2010-08-19 DIAGNOSIS — E785 Hyperlipidemia, unspecified: Secondary | ICD-10-CM

## 2010-08-19 DIAGNOSIS — I10 Essential (primary) hypertension: Secondary | ICD-10-CM

## 2010-08-19 DIAGNOSIS — M5030 Other cervical disc degeneration, unspecified cervical region: Secondary | ICD-10-CM

## 2010-08-19 DIAGNOSIS — Z125 Encounter for screening for malignant neoplasm of prostate: Secondary | ICD-10-CM

## 2010-08-19 LAB — CBC WITH DIFFERENTIAL/PLATELET
Basophils Absolute: 0 10*3/uL (ref 0.0–0.1)
Basophils Relative: 0.4 % (ref 0.0–3.0)
Eosinophils Absolute: 0.1 10*3/uL (ref 0.0–0.7)
Eosinophils Relative: 2.2 % (ref 0.0–5.0)
HCT: 38.5 % — ABNORMAL LOW (ref 39.0–52.0)
Hemoglobin: 13.5 g/dL (ref 13.0–17.0)
Lymphocytes Relative: 48.8 % — ABNORMAL HIGH (ref 12.0–46.0)
Lymphs Abs: 2.3 10*3/uL (ref 0.7–4.0)
MCHC: 35.1 g/dL (ref 30.0–36.0)
MCV: 98 fl (ref 78.0–100.0)
Monocytes Absolute: 0.4 10*3/uL (ref 0.1–1.0)
Monocytes Relative: 9.1 % (ref 3.0–12.0)
Neutro Abs: 1.8 10*3/uL (ref 1.4–7.7)
Neutrophils Relative %: 39.5 % — ABNORMAL LOW (ref 43.0–77.0)
Platelets: 219 10*3/uL (ref 150.0–400.0)
RBC: 3.93 Mil/uL — ABNORMAL LOW (ref 4.22–5.81)
RDW: 13 % (ref 11.5–14.6)
WBC: 4.6 10*3/uL (ref 4.5–10.5)

## 2010-08-19 LAB — LIPID PANEL
Cholesterol: 170 mg/dL (ref 0–200)
HDL: 71.2 mg/dL (ref >39.00)
LDL Cholesterol: 91 mg/dL (ref 0–99)
Total CHOL/HDL Ratio: 2
Triglycerides: 37 mg/dL (ref 0.0–149.0)
VLDL: 7.4 mg/dL (ref 0.0–40.0)

## 2010-08-19 LAB — RENAL FUNCTION PANEL
Albumin: 4.2 g/dL (ref 3.5–5.2)
BUN: 16 mg/dL (ref 6–23)
CO2: 27 mEq/L (ref 19–32)
Calcium: 9.3 mg/dL (ref 8.4–10.5)
Chloride: 100 mEq/L (ref 96–112)
Creatinine, Ser: 0.8 mg/dL (ref 0.4–1.5)
GFR: 99.36 mL/min (ref >60.00)
Glucose, Bld: 103 mg/dL — ABNORMAL HIGH (ref 70–99)
Phosphorus: 3.5 mg/dL (ref 2.3–4.6)
Potassium: 5.2 mEq/L — ABNORMAL HIGH (ref 3.5–5.1)
Sodium: 134 mEq/L — ABNORMAL LOW (ref 135–145)

## 2010-08-19 LAB — MICROALBUMIN / CREATININE URINE RATIO
Creatinine,U: 69.7 mg/dL
Microalb Creat Ratio: 0.6 mg/g (ref 0.0–30.0)
Microalb, Ur: 0.4 mg/dL (ref 0.0–1.9)

## 2010-08-19 LAB — HEPATIC FUNCTION PANEL
ALT: 29 U/L (ref 0–53)
AST: 22 U/L (ref 0–37)
Albumin: 4.2 g/dL (ref 3.5–5.2)
Alkaline Phosphatase: 74 U/L (ref 39–117)
Bilirubin, Direct: 0.1 mg/dL (ref 0.0–0.3)
Total Bilirubin: 0.6 mg/dL (ref 0.3–1.2)
Total Protein: 6.6 g/dL (ref 6.0–8.3)

## 2010-08-19 LAB — PSA: PSA: 0.74 ng/mL (ref 0.10–4.00)

## 2010-08-22 LAB — CONVERTED CEMR LAB: Vit D, 25-Hydroxy: 21 ng/mL — ABNORMAL LOW (ref 30–89)

## 2010-08-26 ENCOUNTER — Encounter (INDEPENDENT_AMBULATORY_CARE_PROVIDER_SITE_OTHER): Payer: BC Managed Care – PPO | Admitting: Family Medicine

## 2010-08-26 ENCOUNTER — Encounter: Payer: Self-pay | Admitting: Family Medicine

## 2010-08-26 DIAGNOSIS — Z0000 Encounter for general adult medical examination without abnormal findings: Secondary | ICD-10-CM

## 2010-08-26 DIAGNOSIS — N400 Enlarged prostate without lower urinary tract symptoms: Secondary | ICD-10-CM | POA: Insufficient documentation

## 2010-08-26 DIAGNOSIS — N138 Other obstructive and reflux uropathy: Secondary | ICD-10-CM

## 2010-08-26 DIAGNOSIS — E875 Hyperkalemia: Secondary | ICD-10-CM | POA: Insufficient documentation

## 2010-08-26 DIAGNOSIS — R252 Cramp and spasm: Secondary | ICD-10-CM

## 2010-08-26 DIAGNOSIS — E669 Obesity, unspecified: Secondary | ICD-10-CM

## 2010-08-26 DIAGNOSIS — E559 Vitamin D deficiency, unspecified: Secondary | ICD-10-CM | POA: Insufficient documentation

## 2010-08-26 DIAGNOSIS — N401 Enlarged prostate with lower urinary tract symptoms: Secondary | ICD-10-CM

## 2010-09-02 NOTE — Assessment & Plan Note (Signed)
Summary: CPX/ JRR   Vital Signs:  Patient profile:   60 year old male Weight:      205.75 pounds Temp:     97.9 degrees F oral Pulse rate:   84 / minute Pulse rhythm:   regular BP sitting:   148 / 70  (left arm) Cuff size:   large  Vitals Entered By: Sydell Axon LPN (August 26, 2010 8:12 AM) CC: 30 Minute checkup   History of Present Illness: Pt here for Comp Exam. He has gained 10 pounds since last here in Jul. His BP is up some. When the weather gets warmer, he thinks he will be able to lose. He had R Rotator cuff surgery and is almost nml now except str. This kept him from doing anything last summer, when he gained wt. He has complaints of cramps.  He is up 5-6 times at  Ears ring, he feels from wax...now doing well. He takes one ASA a day.  He has burning routinely after sitting for prolonged time. He has been drinking a lot of tomato juice for the last six months. He got headaches with Chantix. Discussed trying 1/2 tab.   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 One  Tab By Mouth Once Daily 2)  Lipitor 10 Mg  Tabs (Atorvastatin Calcium) .[name].. 1 Tablet At Bedtime By Mouth 3)  Bayer Aspirin 325 Mg  Tabs (Aspirin) .[name].. 1 Daily 4)  Cialis 20 Mg  Tabs (Tadalafil) .... 1/2 Tablet Pior 5)  Symbicort 80-4.5 Mcg/act Aero (Budesonide-Formoterol Fumarate) .... 2 Puffs Twice A Day As Needed  Current Medications (verified): 1)  Lisinopril 10 Mg Tabs (Lisinopril) .... One  Tab By Mouth Once Daily 2)  Lipitor 10 Mg  Tabs (Atorvastatin Calcium) .[name].. 1 Tablet At Bedtime By Mouth 3)  Bayer Aspirin 325 Mg  Tabs (Aspirin) .[name].. 1 Daily 4)  Cialis 20 Mg  Tabs (Tadalafil) .... 1/2 Tablet Pior 5)  Symbicort 80-4.5 Mcg/act Aero (Budesonide-Formoterol Fumarate) .... 2 Puffs Twice A Day As Needed  Allergies: No Known Drug Allergies  Past History:  Past Medical History: Last updated: 07/03/2009 Allergic rhinitis(:07/1998) COPD:(07/1998) with ongoing tobacco use Hyperlipidemia:(06/1996) Hypertension:(05/1998) h/oETOH abuse Back surgery 2007 Neck fusion 20003 Erectile dysfunction ETT/Myoview (2002)   --walked 12 mins. EF 61% diaphragmatic attenuation. no ischemia Echo EF 60-65%  Past  Surgical History: Last updated: 02/08/2010 RECTAL FISSURE REPAIR  ([place]) LEFT KNEE ARTHROSCOPY WITH MENISECTOMY MEDIAL:(11/02/1999) NECK MRI DDD SPINAL CORD COMP. C3/4, C4/5, C5/6 , C6/7, HNP MULTIPLE:(07/31/2001) CARDIOLITE NORMAL(:12/25/2001) ANTERIOR CERVICAL DISEC. FUSION/ C3-4,C4-5, C5-6, C6-7:DRJENKINS):(06/26/2002) LAMINECTOMY L4/5:( DR. JENKINS):(03/14/2006) ETT Nml  12./2010 R Rotator cuff surgery 2011, Dr. Ranell Patrick.   Family History: Last updated: Sep 06, 2010 Father:DECEASED AT 4 : CAD, HTN; STROKE; DM; ETOH; CABG X 3 LATE 50"S  Mother: DECEASED 30 CAD ;HTN; RAD; STROKE ; CABG X 3 LATE 50'S BROTHER  A 55   IRREGULAR HEART   HAS M[employer]S. [employer] ON DISABILITY SISTER A 65  WIDOW  Pneumonia with virus...coma for 3 mos, hand, fingers and toes amputations CV: +PARENTS; CABG BOTH ; DECEASED WITH MIS HBP: + PARENTS DM: + FATHER GOUT/ARTHRITIS: PROSTATE CANCER: BREAST/OVARIAN/UTERINE CANCER: NEGATIVE ETOH ABUSE: + FATHER DRUG ABUSE: NEGATIVE OTHER : STROKE + PARENTS  Social History: Last updated: 11/06/2008 Marital Status: Married LIVES WITH WIFE Children: 1 SON / 2 DAUGHTERS Occupation: Event organiser Drinks 6-8 beers/day Tobacco 1 ppd x 30 yrs   Risk Factors: Alcohol Use: 2 (September 06, 2010) Caffeine Use: 2 (09-06-2010) Exercise: no (09-06-10)  Risk Factors: Smoking Status: current (September 06, 2010) Packs/Day: 1/3 (06-Sep-2010) Passive Smoke Exposure: yes (09-06-2010)  Family History: Father:DECEASED AT 56 : CAD, HTN; STROKE; DM; ETOH; CABG X 3 LATE 50"S  Mother: DECEASED 60 CAD ;HTN; RAD; STROKE ; CABG X 3 LATE 50'S BROTHER  A 55  IRREGULAR HEART   HAS M[employer]S. [employer] ON DISABILITY SISTER A 65  WIDOW  Pneumonia with virus...coma for 3 mos, hand, fingers and toes amputations CV: +PARENTS; CABG BOTH ; DECEASED WITH MIS HBP: + PARENTS DM: + FATHER GOUT/ARTHRITIS: PROSTATE CANCER: BREAST/OVARIAN/UTERINE CANCER: NEGATIVE ETOH ABUSE: + FATHER DRUG ABUSE: NEGATIVE OTHER : STROKE + PARENTS  Review of Systems General:  Denies chills, fatigue, fever, sweats, weakness, and weight loss. Eyes:  Denies blurring, discharge, and eye pain. ENT:  Complains of ringing in ears; denies decreased hearing and earache; see HPI. CV:  Complains of shortness of breath with exertion; denies chest pain or discomfort, fainting, fatigue, palpitations, swelling of feet, and swelling of hands; due to weight. Resp:  Denies cough, shortness of breath, and wheezing; smoking problems/side effects. GI:  Complains of indigestion; denies abdominal pain, bloody stools, change in bowel habits, constipation, dark tarry  stools, diarrhea, loss of appetite, nausea, vomiting, vomiting blood, and yellowish skin color; see HPI. GU:  Complains of nocturia; denies discharge and dysuria; See HPI. MS:  Complains of cramps; denies joint pain, low back pain, muscle aches, muscle weakness, and stiffness; See HPI. Derm:  Denies dryness, itching, and rash. Neuro:  Denies numbness, poor balance, tingling, and tremors.  Physical Exam  General:  Well-developed,well-nourished,in no acute distress; alert,appropriate and cooperative throughout examination, mildly obese. Head:  Normocephalic and atraumatic without obvious abnormalities. Sinuses NT. Eyes:  Conjunctiva clear bilaterally.  Ears:  External ear exam shows no significant lesions or deformities.  Otoscopic examination reveals clear canals, tympanic membranes are intact bilaterally without bulging, retraction, inflammation or discharge. Hearing is grossly normal bilaterally. Mild cerumen bilat R, L clear today.. Nose:  External nasal examination shows no deformity or inflammation. Nasal mucosa are pink and moist without lesions or exudates. Mouth:  Oral mucosa and oropharynx without lesions or exudates.  Teeth in good repair. Neck:  No deformities, masses, or tenderness noted. Chest Wall:  No deformities, masses, tenderness or gynecomastia noted. Breasts:  No masses or gynecomastia noted Lungs:  Normal respiratory effort, chest  expands symmetrically. Lungs are clear to auscultation, no crackles or wheezes. Heart:  Normal rate and regular rhythm. S1 and S2 normal without gallop, murmur, click, rub or other extra sounds. Abdomen:  Bowel sounds positive,abdomen soft and non-tender without masses, organomegaly or hernias noted. Rectal:  No external abnormalities noted except deflated ext hemms. Normal sphincter tone. No rectal masses or tenderness. Gneg, scarred prior fissure repair left side of canal. Genitalia:  Testes bilaterally descended without nodularity, tenderness or  masses. No scrotal masses or lesions. No penis lesions or urethral discharge. Prostate:  Prostate gland firm and smooth, no enlargement, nodularity, tenderness, mass, asymmetry or induration. 40-60gms Msk:  No deformity or scoliosis noted of thoracic or lumbar spine.  Well healed lumbar surgical scar. Pulses:  R and L carotid,radial,femoral,dorsalis pedis and posterior tibial pulses are full and equal bilaterally Extremities:  No clubbing, cyanosis, edema, or deformity noted with normal full range of motion of all joints except right shoulder limited by discomfort actively  and resisted in multiple directions..  Well healed right shoulder surgical scar. Neurologic:  No cranial nerve deficits noted. Station and gait are normal. Sensory, motor and coordinative functions appear intact. Skin:  Intact without suspicious lesions or rashes, Tatto posterior  right shoulder. Cervical Nodes:  No lymphadenopathy noted Inguinal Nodes:  No significant adenopathy Psych:  Cognition and judgment appear intact. Alert and cooperative with normal attention span and concentration. No apparent delusions, illusions, hallucinations   Impression & Recommendations:  Problem # 1:  HEALTH MAINTENANCE EXAM (ICD-V70.0)  Reviewed preventive care protocols, scheduled due services, and updated immunizations.  Problem # 2:  TOBACCO USER (ICD-305.1) Assessment: Unchanged Had tried Chantix and got bad headaches at full dose and quit. Suggested trying 1/2 tab daily for 4 and then two times a day. Taper off as well.  Problem # 3:  HYPERGLYCEMIA (ICD-790.29) Assessment: Unchanged Stable, kidney fctn ok.  Problem # 4:  HYPERTENSION (ICD-401.9) Assessment: Deteriorated Elevated with weight gain and also prob from V8 he is drinking. His updated medication list for this problem includes:    Lisinopril 10 Mg Tabs (Lisinopril) ..... One  tab by mouth once daily  BP today: 148/70 Prior BP: 124/76 (02/08/2010)  Labs  Reviewed: K+: 5.2 (08/19/2010) Creat: : 0.8 (08/19/2010)   Chol: 170 (08/19/2010)   HDL: 71.20 (08/19/2010)   LDL: 91 (08/19/2010)   TG: 37.0 (08/19/2010)  Problem # 5:  HYPERLIPIDEMIA (ICD-272.4) Assessment: Unchanged Good nos. Cont Lipitor...he is now on generic. The following medications were removed from the medication list:    Lipitor 10 Mg Tabs (Atorvastatin calcium) .[name]... 1 tablet at bedtime by mouth His updated medication list for this problem includes:    Atorvastatin Calcium 10 Mg Tabs (Atorvastatin calcium) ..... One tab by mouth at night  Labs Reviewed: SGOT: 22 (08/19/2010)   SGPT: 29 (08/19/2010)   HDL:71.20 (08/19/2010), 84.40 (08/24/2009)  LDL:91 (08/19/2010), 91 (08/19/2008)  Chol:170 (08/19/2010), 203 (08/24/2009)  Trig:37.0 (08/19/2010), 51.0 (08/24/2009)  Problem # 6:  COPD (ICD-496) Assessment: Unchanged Breathing difficult with exertion...suggest he give Chantix another try. His updated medication list for this problem includes:    Symbicort 80-4.5 Mcg/act Aero (Budesonide-formoterol fumarate) .[name]... 2 puffs twice a day as needed  Problem # 7:  OBESITY (ICD-278.00) Assessment: Deteriorated Discussed at length.   Problem # 8:  HYPERPOTASSEMIA (ICD-276.7) Assessment: New Avoid V8. Recheck next year.  Problem # 9:  HYPERTROPHY PROSTATE W/UR OBST & OTH LUTS (ICD-600.01) Assessment: New Try Shiraz wine at night and avoiding liquids  after supper.  Problem # 10:  VITAMIN D DEFICIENCY (ICD-268.9) Assessment: New Start Vit D 1000Iu two times a day. Recheck next year.  Complete Medication List: 1)  Lisinopril 10 Mg Tabs (Lisinopril) .... One  tab by mouth once daily 2)  Bayer Aspirin 325 Mg Tabs (Aspirin) .[name].. 1 daily 3)  Cialis 20 Mg Tabs (Tadalafil) .... 1/2 tablet pior 4)  Symbicort 80-4.5 Mcg/act Aero (Budesonide-formoterol fumarate) .... 2 puffs twice a day as needed 5)  Atorvastatin Calcium 10 Mg Tabs (Atorvastatin calcium) .... One tab by mouth at  night  Patient Instructions: 1)  Look into Jogging in Jog. Drink every day.  2)  Drink small glass Shiraz wine every nite to help urination at night. 3)  Start Vit D 1000Iu two times a day. 4)  Try Chantix 1/2 tab two times a day. 5)  LOSE WEIGHT!!! 6)  Needs stool test in future.   Orders Added: 1)  Est. Patient 40-64 years [99396] 2)  Est. Patient Level III [64403]    Current Allergies (reviewed today): No known allergies

## 2010-09-13 ENCOUNTER — Encounter: Payer: Self-pay | Admitting: Family Medicine

## 2010-09-16 HISTORY — PX: COLONOSCOPY: SHX174

## 2010-09-23 ENCOUNTER — Encounter: Payer: Self-pay | Admitting: Family Medicine

## 2010-10-05 NOTE — Procedures (Signed)
Summary: Colonoscopy  Colonoscopy   Imported By: Kassie Mends 09/28/2010 08:38:08  _____________________________________________________________________  External Attachment:    Type:   Image     Comment:   External Document  Appended Document: Colonoscopy    Clinical Lists Changes  Observations: Added new observation of COLONNXTDUE: 09/2015 (09/28/2010 10:42) Added new observation of PAST SURG HX: RECTAL FISSURE REPAIR  ([place]) LEFT KNEE ARTHROSCOPY WITH MENISECTOMY MEDIAL:(11/02/1999) NECK MRI DDD SPINAL CORD COMP. C3/4, C4/5, C5/6 , C6/7, HNP MULTIPLE:(07/31/2001) CARDIOLITE NORMAL(:12/25/2001) ANTERIOR CERVICAL DISEC. FUSION/ C3-4,C4-5, C5-6, C6-7:DRJENKINS):(06/26/2002) LAMINECTOMY L4/5:( DR. JENKINS):(03/14/2006) ETT Nml  12./2010 R Rotator cuff surgery 2011, Dr.  Colonoscopy Polyps, multip  Divertics, Medium Hemms (Dr Elnoria Howard) (09/23/2010)          5-33yrs (09/28/2010 10:42) Added new observation of COLONOSCOPY: Adenomatous Polyp (09/23/2010 10:44)       Past Surgical History:    RECTAL FISSURE REPAIR  ([place])    LEFT KNEE ARTHROSCOPY WITH MENISECTOMY MEDIAL:(11/02/1999)    NECK MRI DDD SPINAL CORD COMP. C3/4, C4/5, C5/6 , C6/7, HNP MULTIPLE:(07/31/2001)    CARDIOLITE NORMAL(:12/25/2001)    ANTERIOR CERVICAL DISEC. FUSION/ C3-4,C4-5, C5-6, C6-7:DRJENKINS):(06/26/2002)    LAMINECTOMY L4/5:( DR. JENKINS):(03/14/2006)    ETT Nml  12./2010    R Rotator cuff surgery 2011, Dr.     Colonoscopy Polyps, multip  Divertics, Medium Hemms (Dr Elnoria Howard) (09/23/2010)          5-29yrs    Preventive Care Screening  Colonoscopy:    Date:  09/23/2010    Next Due:  09/2015    Results:  Adenomatous Polyp

## 2010-11-29 ENCOUNTER — Other Ambulatory Visit: Payer: Self-pay | Admitting: Family Medicine

## 2010-12-03 NOTE — Op Note (Signed)
[name], [name]               ACCOUNT NO.:  [id number]   MEDICAL RECORD NO.:  [id number]          PATIENT TYPE:  OIB   LOCATION:  3023                         FACILITY:  [facility]   PHYSICIAN:  Cristi Loron, M.D.DATE OF BIRTH:  August 16, 1950   DATE OF PROCEDURE:  02/14/2006  DATE OF DISCHARGE:                                 OPERATIVE REPORT   BRIEF HISTORY:  The patient is a 60 year old white male who suffered from  severe back and left leg pain consistent with the left L3 radiculopathy.  He  failed medical management, worked up with a lumbar MRI which demonstrated a  far lateral herniated at L3-4 on the left.  I discussed various treatment  options with the patient and his family including surgery.  The patient has  weighed the risks, benefits and alternatives.  Has decided to proceed with  the left L3-4 far lateral microdiskectomy.   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   PROCEDURE:  Left L3-4 far lateral microdiskectomy using microdissection.   SURGEON:  Dr. Delma Officer   ASSISTANT:  Dr. Colon Branch   ANESTHESIA:  General endotracheal.   ESTIMATED BLOOD LOSS:  50 mL.   SPECIMENS:  None.   DRAINS:  None.   COMPLICATIONS:  None.   DESCRIPTION OF PROCEDURE:  The patient is brought the operating room by  anesthesia team, general endotracheal anesthesia was induced.  The patient  was turned prone position on Wilson frame.  His lumbosacral region was then  prepared with Betadine scrub and Betadine solution.  Sterile drapes were  applied. I then injected the area to be incised with Marcaine with  epinephrine solution and then used scalpel to make a linear midline incision  over the L3-4 interspace.  I used electrocautery perform a left-sided  subperiosteal dissection  exposing left spinous process and lamina of L3-L4,  I obtained intraoperative radiograph to confirm our location and then we  inserted the McCullough retractor for exposure.  I then used electrocautery  to detach the fascia from the lateral aspect of the left L3-4 facette and  exposed the left L3 pars region.  I then brought the operative microscope  into the field under its magnification and illumination completed the  microdissection/ decompression.  I used a high-speed drill to drill off the  lateral edge of the left L3 pars.  This exposed the underlying  intertransverse ligament.  I carefully dissected through the intertransverse  ligament using the microdissectors and then removed the intertransverse  ligament using the Kerrison punch and then dissected through the  transversalis muscle and identified and exposed the exiting left L3 nerve  root as it exited just caudal to the left L3 pedicle.  I then used  microsection to free up the nerve root and then dissected out lateral.  There was a large free fragment disk herniation compressing the left L3  nerve root laterally.  I freed it up using microdissection and then removed  the disk herniation multiple fragments using micropituitary forceps.  I then  palpated along the ventral surface of the left L3 nerve root and this point  was well decompressed all the way from the interspinal portion all the way  out into the soft tissues.  I used electrocautery to obtain hemostasis and  bipolar electrocautery to obtain hemostasis and then irrigated the wound out  with bacitracin solution.  At this point we had a good decompression.  We  removed the McCullough retractor and then reapproximated the patient's  thoracolumbar fascia with interrupted #1 Vicryl suture, subcutaneous tissue  with a 2-0 Vicryl suture and skin with Steri-Strips and Benzoin.  The wound  was then coated bacitracin ointment.  Sterile dressing was applied.  The  drapes were  removed.  The patient was then extubated by anesthesia team and  transported post anesthesia care unit in stable condition.  All sponge,  instrument and needle counts correct at end this case.  We did not need to  enter into the intervertebral disk space as all the disk herniation was free  fragment underneath the ventral aspect of the exiting L3 nerve root.  We did  inspect the far lateral annulus.  There was no herniations which seemed to  be impending.  I did not see a large hole in the annulus.      Cristi Loron, M.D.  Electronically Signed     JDJ/MEDQ  D:  02/14/2006  T:  02/15/2006  Job:  [id number]

## 2010-12-03 NOTE — Op Note (Signed)
NAME:  [name], [name]                         ACCOUNT NO.:  [id number]   MEDICAL RECORD NO.:  [id number]                   PATIENT TYPE:  INP   LOCATION:  3104                                 FACILITY:  [facility]   PHYSICIAN:  Cristi Loron, M.D.            DATE OF BIRTH:  [birth year]   DATE OF PROCEDURE:  06/26/2002  DATE OF DISCHARGE:                                 OPERATIVE REPORT   PREOPERATIVE DIAGNOSES:  C3-4, C4-5, C5-6, C6-7 herniated nucleus pulposus,  spinal stenosis, spondylosis, stenosis, cervical myelopathy, cervical  radiculopathy.   POSTOPERATIVE DIAGNOSES:  C3-4, C4-5, C5-6, C6-7 herniated nucleus pulposus,  spinal stenosis, spondylosis, stenosis, cervical myelopathy, cervical  radiculopathy.   PROCEDURES:  1. C3-4, C4-5, C5-6, C6-7 extensive anterior cervical     diskectomy/decompression.  2. Interbody iliac crest allograft arthrodesis.  3. Anterior cervical plating, C3 to C7, with Premier titanium plate and     screws.   SURGEON:  Cristi Loron, M.D.   ASSISTANT:  Danae Orleans. Venetia Maxon, M.D.   ANESTHESIA:  General endotracheal.   ESTIMATED BLOOD LOSS:  250 cc.   SPECIMENS:  None.   DRAINS:  None.   COMPLICATIONS:  None.   BRIEF HISTORY:  The patient is a 60 year old white male who has suffered  from neck and left arm pain.  He failed medical management and was worked up  with a cervical MRI, which demonstrated a herniated disk and spondylosis,  stenosis, etc., at C3-4, C4-5, C6-7, C6-7.  He failed medical management and  therefore weighed the risks, benefits, and alternatives to surgery, and he  decided to proceed with an anterior cervical diskectomy, fusion, and  plating.   DESCRIPTION OF PROCEDURE:  The patient was brought to the operating room by  the anesthesia team.  General endotracheal anesthesia was induced.  The  patient remained in the supine position.  A roll was placed under his  shoulders to place his neck in sight extension.  His  anterior cervical  region was then prepared with Betadine scrub and Betadine solution.  Sterile  drapes were applied.  I then injected the area to be incised with Marcaine  with epinephrine solution and used a scalpel to make a linear midline  incision in the patient's left anterior neck.  I used the Metzenbaum  scissors to divide the platysma muscle and then dissect medial to the  sternocleidomastoid muscle, jugular vein, and carotid artery.  I bluntly  dissected down toward the anterior cervical spine, carefully identifying the  esophagus and retracting it medially.  I cleared the soft tissue from the  anterior cervical spine using Kitner swabs and then inserted a bent spinal  needle into the upper exposed interspace to confirm our location.  We then  used electrocautery to detach the medial border of the longus colli muscle  bilaterally from the C3-4, C4-5, C5-6, and C6-7 intervertebral disk space  and then inserted the  Caspar self-retaining retractor for exposure.  We then  brought the operating microscope into the field and under its magnification  and illumination completed the decompression.  We began at C3-4 using the 15  blade scalpel to incise the C3-4 intervertebral disk and performed a partial  diskectomy using the pituitary forceps and the Karlin curettes.  We inserted  distraction screws at C3 and C4, distracted the interspace, and then used  the high-speed drill to decorticate the vertebral end plates at Z6-1 and  then drill away the remainder of the C3-4 intervertebral disk as well as  drill away some posterior spondylosis and thin out the posterior  longitudinal ligament.  The ligament was then incised with the arachnoid  knife and then removed with the Kerrison punch, undercutting at C3-4,  decompressing the thecal sac, and then a foraminotomy was performed about  the bilateral C4 nerve roots, completing the diskectomy/decompression at C3-  4.  The remainder of the  diskectomies were performed in a similar fashion at  C4-5, C5-6, C6-7.  Of note, at these three levels there was a large central  herniated disk with considerable compression of the thecal sac centrally.  We got a good decompression at C4-5, C5-6, C6-7, as well as a good  foraminotomy about the bilateral C5, C6, and C7 nerve roots. Having  completed the diskectomy/decompression at C3-4, C4-5, C5-6, and C6-7, we now  turned our attention to the interbody allograft crest arthrodesis.  We  obtained four 8 mm iliac crest tricortical bone grafts.  We began at C6-7,  i.e., where we had the distraction screws, and we placed an 8 mm bone graft  into the distracted C6-7 interspace.  When we removed the distraction  screws, the one graft was somewhat loose.  We therefore obtained another  bone graft and redistracted the interspace and tapped this 9 mm graft into  place, and there was a good, snug fit with the 9 mm graft.  We then  sequentially distracted the interspaces and placed an 8 mm x 1 cm bone graft  in at C5-6, C4-5, and C3-4.  There was a good, snug fit of bone graft at  each level.  We now turned our attention to the anterior spinal  instrumentation.  We removed some ventral spondylosis with the high-speed  drill so that the plate would lie flat.  We obtained the appropriate length  of Premier titanium plate, laid it along the anterior aspect of the  vertebral bodies from C3 down to C7, drilled two holes at C3, C4, C5, C6,  and C7, and then secured the plate to the vertebral bodies by placing two 14  mm screws at C4, C4, C5, and C6, and two 15 mm screws at C7.  We then  obtained the intraoperative radiograph that demonstrated good position of  the plate and screws at the upper aspect.  We could not see the lower plate  and screws because of the patient's body habitus, but they looked good in  vivo.  We then secured the locking slide over the screws and tightened the secondary screws to  secure the primary screws to the plate.  We then  obtained stringent hemostasis using bipolar electrocautery and Gelfoam.  We  copiously irrigated the wound out with bacitracin solution, then removed the  solution.  We removed the Caspar self-retaining retractor and then inspected  the esophagus for any damage.  There was none apparent.  We then  reapproximated the patient's platysma muscle  with interrupted 3-0 Vicryl  suture, the subcutaneous tissue with interrupted 3-0 Vicryl suture, and the  skin with Steri-Strips and Benzoin.  The wound was then coated with  bacitracin ointment, a sterile dressing applied, the drapes were removed,  and the patient was subsequently extubated by the anesthesia team and  transported to the postanesthesia care unit in stable condition.  All  sponge, instrument, and needle counts were correct at the end of this case.  I should mention that there was some bone edge oozing at C5-6.  We packed it  off with some Gelfoam, then removed the Gelfoam.  It continued to ooze.  We  therefore obtained some Avitene and placed it in the interspace, and this  stopped the oozing.  We then washed the Avitene out, and there was good  hemostasis at this point.                                               Cristi Loron, M.D.    JDJ/MEDQ  D:  06/26/2002  T:  06/27/2002  Job:  [id number]

## 2011-01-04 ENCOUNTER — Encounter: Payer: Self-pay | Admitting: Internal Medicine

## 2011-02-09 ENCOUNTER — Other Ambulatory Visit: Payer: Self-pay | Admitting: Family Medicine

## 2011-02-10 NOTE — Telephone Encounter (Addendum)
Received refill request electronically from pharmacy. Is it okay to refill medication?  Patient has an appointment scheduled with Dr. Sharen Hones 08/25/11. Please verify dispense amount and number of refills.

## 2011-08-12 ENCOUNTER — Other Ambulatory Visit: Payer: Self-pay | Admitting: Family Medicine

## 2011-08-12 DIAGNOSIS — E559 Vitamin D deficiency, unspecified: Secondary | ICD-10-CM

## 2011-08-12 DIAGNOSIS — E785 Hyperlipidemia, unspecified: Secondary | ICD-10-CM

## 2011-08-12 DIAGNOSIS — I10 Essential (primary) hypertension: Secondary | ICD-10-CM

## 2011-08-12 DIAGNOSIS — Z125 Encounter for screening for malignant neoplasm of prostate: Secondary | ICD-10-CM

## 2011-08-17 ENCOUNTER — Other Ambulatory Visit (INDEPENDENT_AMBULATORY_CARE_PROVIDER_SITE_OTHER): Payer: BC Managed Care – PPO

## 2011-08-17 DIAGNOSIS — Z125 Encounter for screening for malignant neoplasm of prostate: Secondary | ICD-10-CM

## 2011-08-17 DIAGNOSIS — E785 Hyperlipidemia, unspecified: Secondary | ICD-10-CM

## 2011-08-17 DIAGNOSIS — E559 Vitamin D deficiency, unspecified: Secondary | ICD-10-CM

## 2011-08-17 DIAGNOSIS — I10 Essential (primary) hypertension: Secondary | ICD-10-CM

## 2011-08-17 LAB — COMPREHENSIVE METABOLIC PANEL
ALT: 35 U/L (ref 0–53)
AST: 22 U/L (ref 0–37)
Albumin: 4.3 g/dL (ref 3.5–5.2)
Alkaline Phosphatase: 71 U/L (ref 39–117)
BUN: 18 mg/dL (ref 6–23)
CO2: 29 mEq/L (ref 19–32)
Calcium: 9.4 mg/dL (ref 8.4–10.5)
Chloride: 104 mEq/L (ref 96–112)
Creatinine, Ser: 1.2 mg/dL (ref 0.4–1.5)
GFR: 68.23 mL/min (ref >60.00)
Glucose, Bld: 112 mg/dL — ABNORMAL HIGH (ref 70–99)
Potassium: 4.8 mEq/L (ref 3.5–5.1)
Sodium: 139 mEq/L (ref 135–145)
Total Bilirubin: 0.8 mg/dL (ref 0.3–1.2)
Total Protein: 6.8 g/dL (ref 6.0–8.3)

## 2011-08-17 LAB — LIPID PANEL
Cholesterol: 194 mg/dL (ref 0–200)
HDL: 71.3 mg/dL (ref >39.00)
LDL Cholesterol: 114 mg/dL — ABNORMAL HIGH (ref 0–99)
Total CHOL/HDL Ratio: 3
Triglycerides: 45 mg/dL (ref 0.0–149.0)
VLDL: 9 mg/dL (ref 0.0–40.0)

## 2011-08-17 LAB — PSA: PSA: 1.48 ng/mL (ref 0.10–4.00)

## 2011-08-18 LAB — VITAMIN D 25 HYDROXY (VIT D DEFICIENCY, FRACTURES): Vit D, 25-Hydroxy: 25 ng/mL — ABNORMAL LOW (ref 30–89)

## 2011-08-25 ENCOUNTER — Encounter: Payer: BC Managed Care – PPO | Admitting: Family Medicine

## 2011-08-25 ENCOUNTER — Encounter: Payer: Self-pay | Admitting: Family Medicine

## 2011-08-25 ENCOUNTER — Ambulatory Visit (INDEPENDENT_AMBULATORY_CARE_PROVIDER_SITE_OTHER): Payer: BC Managed Care – PPO | Admitting: Family Medicine

## 2011-08-25 VITALS — BP 136/86 | HR 96 | Temp 99.2°F | Ht 66.0 in | Wt 208.0 lb

## 2011-08-25 DIAGNOSIS — E559 Vitamin D deficiency, unspecified: Secondary | ICD-10-CM

## 2011-08-25 DIAGNOSIS — N401 Enlarged prostate with lower urinary tract symptoms: Secondary | ICD-10-CM

## 2011-08-25 DIAGNOSIS — Z0000 Encounter for general adult medical examination without abnormal findings: Secondary | ICD-10-CM | POA: Insufficient documentation

## 2011-08-25 DIAGNOSIS — N138 Other obstructive and reflux uropathy: Secondary | ICD-10-CM

## 2011-08-25 DIAGNOSIS — Z789 Other specified health status: Secondary | ICD-10-CM

## 2011-08-25 DIAGNOSIS — F17200 Nicotine dependence, unspecified, uncomplicated: Secondary | ICD-10-CM

## 2011-08-25 DIAGNOSIS — J449 Chronic obstructive pulmonary disease, unspecified: Secondary | ICD-10-CM

## 2011-08-25 DIAGNOSIS — E669 Obesity, unspecified: Secondary | ICD-10-CM

## 2011-08-25 DIAGNOSIS — Z7289 Other problems related to lifestyle: Secondary | ICD-10-CM

## 2011-08-25 MED ORDER — BUDESONIDE-FORMOTEROL FUMARATE 80-4.5 MCG/ACT IN AERO: 2.0000 | INHALATION_SPRAY | Freq: Every day | RESPIRATORY_TRACT | Status: DC

## 2011-08-25 NOTE — Assessment & Plan Note (Signed)
Encouraged cessation pre contemplative  

## 2011-08-25 NOTE — Assessment & Plan Note (Signed)
Discussed healthy living, diet.

## 2011-08-25 NOTE — Progress Notes (Signed)
Subjective:    Patient ID: [name], male    DOB: December 21, 1950, 61 y.o.   MRN: [id number]  HPI CC: CPE  Back pain issues.  Has had ESI, rpt pending.  Has apt with Dr. Lovell Sheehan coming up.  Sees Dr. Rayburn Ma for ESI.  Chronic pin in back with left radiculopathy.  R knee pain, R shoulder s/p surgery.  Smoker - chantix caused migraines.  Smoking 1 ppd.  Smoking 1/2 ppd.  EtOH - several beers daily.  6 pack/day.  Lives with wife. Has 1 son, 2 daughters. Occupation: Event organiser.  Preventative: Colonoscopy 09/23/2010 - polyps, diverticula, hemorrhoids.  Rpt 5 yrs Prostate - yearly.  DRE today, PSA reassuring.  Nocturia x3-4.  Strong stream. Flu done. Td 2004.  Medications and allergies reviewed and updated in chart.  Past histories reviewed and updated if relevant as below. Patient Active Problem List  Diagnoses  . ONYCHOMYCOSIS, BILATERAL  . HYPERLIPIDEMIA  . TOBACCO USER  . HYPERTENSION  . PREMATURE VENTRICULAR CONTRACTIONS  . PVD  . ALLERGIC RHINITIS  . COPD  . DEGENERATIVE DISC DISEASE, CERVICAL SPINE  . DYSPNEA  . SNORING  . HYPERGLYCEMIA  . HEPATITIS C, HX OF  . VITAMIN D DEFICIENCY  . HYPERPOTASSEMIA  . OBESITY  . HYPERTROPHY PROSTATE W/UR OBST & OTH LUTS   Past Medical History  Diagnosis Date  . Allergic rhinitis 07/1998  . COPD (chronic obstructive pulmonary disease) 07/1998    w ongoing tobacco use  . Hyperlipidemia 06/1996  . HTN (hypertension) 05/1998  . ETOH abuse    Past Surgical History  Procedure Date  . Back surgery 2007  . Neck fusion 2003   History  Substance Use Topics  . Smoking status: Current Everyday Smoker -- 0.5 packs/day    Types: Cigarettes  . Smokeless tobacco: Not on file   Comment: 1 ppd X 30 years  . Alcohol Use: Yes     6-8 beers/day   Family History  Problem Relation Age of Onset  . Coronary artery disease Mother   . Hypertension Mother   . Stroke Mother   . Coronary artery disease Father   . Hypertension  Father   . Diabetes Father   . Stroke Father   . Pneumonia Sister   . Irregular heart beat Brother   . Maple syrup urine disease Brother    No Known Allergies Current Outpatient Prescriptions on File Prior to Visit  Medication Sig Dispense Refill  . LIPITOR 10 MG tablet TAKE ONE TABLET BY MOUTH AT BEDTIME  90 each  2  . lisinopril (PRINIVIL,ZESTRIL) 10 MG tablet TAKE ONE TABLET BY MOUTH EVERY DAY  90 tablet  2  . tadalafil (CIALIS) 20 MG tablet Take 20 mg by mouth daily.           Review of Systems  Constitutional: Negative for fever, chills, activity change, appetite change, fatigue and unexpected weight change.  HENT: Negative for hearing loss and neck pain.   Eyes: Negative for visual disturbance.  Respiratory: Positive for wheezing. Negative for cough, chest tightness and shortness of breath.   Cardiovascular: Negative for chest pain, palpitations and leg swelling.  Gastrointestinal: Negative for nausea, vomiting, abdominal pain, diarrhea, constipation, blood in stool and abdominal distention.  Genitourinary: Negative for hematuria and difficulty urinating.  Musculoskeletal: Positive for back pain and arthralgias. Negative for myalgias.  Skin: Negative for rash.  Neurological: Negative for dizziness, seizures, syncope and headaches.  Hematological: Does not bruise/bleed easily.  Psychiatric/Behavioral: Negative for  dysphoric mood. The patient is not nervous/anxious.        Objective:   Physical Exam  Nursing note and vitals reviewed. Constitutional: He is oriented to person, place, and time. He appears well-developed and well-nourished. No distress.  HENT:  Head: Normocephalic and atraumatic.  Right Ear: External ear normal.  Left Ear: External ear normal.  Nose: Nose normal.  Mouth/Throat: Oropharynx is clear and moist. No oropharyngeal exudate.  Eyes: Conjunctivae and EOM are normal. Pupils are equal, round, and reactive to light. No scleral icterus.  Neck: Normal  range of motion. Neck supple. No thyromegaly present.  Cardiovascular: Normal rate, regular rhythm, normal heart sounds and intact distal pulses.   No murmur heard. Pulses:      Radial pulses are 2+ on the right side, and 2+ on the left side.  Pulmonary/Chest: Effort normal and breath sounds normal. No respiratory distress. He has no wheezes. He has no rales.  Abdominal: Soft. Bowel sounds are normal. He exhibits no distension and no mass. There is no tenderness. There is no rebound and no guarding. Hernia confirmed negative in the right inguinal area and confirmed negative in the left inguinal area.  Genitourinary: Prostate normal. Rectal exam shows external hemorrhoid (noninflammed). Rectal exam shows no internal hemorrhoid, no fissure, no mass, no tenderness and anal tone normal. Prostate is not enlarged (20-30gm) and not tender.  Musculoskeletal: Normal range of motion.  Lymphadenopathy:    He has no cervical adenopathy.       Right: No inguinal adenopathy present.       Left: No inguinal adenopathy present.  Neurological: He is alert and oriented to person, place, and time.       CN grossly intact, station and gait intact  Skin: Skin is warm and dry. No rash noted.  Psychiatric: He has a normal mood and affect. His behavior is normal. Judgment and thought content normal.      Assessment & Plan:

## 2011-08-25 NOTE — Assessment & Plan Note (Signed)
On exam not excessively enlarged but endorses sxs.  Discussed pharmacotherapy, pt opts to wait for now.

## 2011-08-25 NOTE — Assessment & Plan Note (Signed)
Preventative protocols reviewed and updated. utd colonoscopy.

## 2011-08-25 NOTE — Assessment & Plan Note (Signed)
rec start vit D 1000 IU daily.  

## 2011-08-25 NOTE — Assessment & Plan Note (Signed)
Mild, no noted spirometry.  Uses symbicort prn, refilled med.

## 2011-08-25 NOTE — Patient Instructions (Signed)
Good to see you today, call us with questions. Let me know if if worsening trouble with voiding. Work on cutting back on smoking and alcohol.

## 2011-08-25 NOTE — Assessment & Plan Note (Signed)
Encouraged backing down some. Goal 3-4 beer max /day.

## 2011-12-13 ENCOUNTER — Other Ambulatory Visit: Payer: Self-pay | Admitting: *Deleted

## 2011-12-13 MED ORDER — LISINOPRIL 10 MG PO TABS: 10.0000 mg | ORAL_TABLET | Freq: Every day | ORAL | Status: DC

## 2011-12-13 MED ORDER — ATORVASTATIN CALCIUM 10 MG PO TABS: 10.0000 mg | ORAL_TABLET | Freq: Every day | ORAL | Status: DC

## 2012-04-20 ENCOUNTER — Ambulatory Visit (INDEPENDENT_AMBULATORY_CARE_PROVIDER_SITE_OTHER): Payer: BC Managed Care – PPO | Admitting: Family Medicine

## 2012-04-20 ENCOUNTER — Ambulatory Visit (INDEPENDENT_AMBULATORY_CARE_PROVIDER_SITE_OTHER)
Admission: RE | Admit: 2012-04-20 | Discharge: 2012-04-20 | Disposition: A | Discharge status: Home / Self Care | Payer: BC Managed Care – PPO | Source: Ambulatory Visit | Attending: Family Medicine | Admitting: Family Medicine

## 2012-04-20 ENCOUNTER — Encounter: Payer: Self-pay | Admitting: Family Medicine

## 2012-04-20 VITALS — BP 144/84 | HR 74 | Temp 98.4°F | Wt 216.8 lb

## 2012-04-20 DIAGNOSIS — R05 Cough: Secondary | ICD-10-CM

## 2012-04-20 DIAGNOSIS — R0989 Other specified symptoms and signs involving the circulatory and respiratory systems: Secondary | ICD-10-CM

## 2012-04-20 DIAGNOSIS — R079 Chest pain, unspecified: Secondary | ICD-10-CM

## 2012-04-20 DIAGNOSIS — R0781 Pleurodynia: Secondary | ICD-10-CM

## 2012-04-20 DIAGNOSIS — R0609 Other forms of dyspnea: Secondary | ICD-10-CM

## 2012-04-20 DIAGNOSIS — R059 Cough, unspecified: Secondary | ICD-10-CM

## 2012-04-20 DIAGNOSIS — F17200 Nicotine dependence, unspecified, uncomplicated: Secondary | ICD-10-CM

## 2012-04-20 MED ORDER — TRAMADOL HCL 50 MG PO TABS: 50.0000 mg | ORAL_TABLET | Freq: Two times a day (BID) | ORAL | Status: DC | PRN

## 2012-04-20 NOTE — Assessment & Plan Note (Signed)
Continue to encourage cessation. 

## 2012-04-20 NOTE — Assessment & Plan Note (Signed)
Doubt fracture, anticipate rib sprain.  discussed this.  Treat with tramadol.  Discussed anticipated course of resolution.  Update if not improving as expected.

## 2012-04-20 NOTE — Assessment & Plan Note (Addendum)
In h/o COPD and continued smoking. Cover with zpack. Check CXR given RUL rhonchi noted today and no recent CXR. No wheezing, normal WOB, no need for prednisone currently. CXR - clear on my read.

## 2012-04-20 NOTE — Progress Notes (Signed)
  Subjective:    Patient ID: [name], male    DOB: 08-08-1950, 61 y.o.   MRN: [id number]  HPI CC: cough with rib pain  Several week h/o cough.  Productive of clear sputum.  Started with chest congestion.  Now left ribcage is hurting from excessive coughing.  Sharp pain with cough.  This morning after coughing felt something tear at left ribcage.  Even hurts to move.  Has had bruised ribs in past, feels same way.  Wanted to make sure didn't have PNA.  + more SOB than normal.    Feeling more tired than normal.  Very fatigued for last 6 months.  Increased sleeping.  Snoring bad.  No periods of apnea.  nonrestorative sleep.  Daytime somnolence.  No PNDyspnea.  Has not had sleep study.  Not currently interested.  Yesterday started sinus medicine, coughing less.  No fevers/chills, abd pain, n/v, ear or tooth pain, headaches, ST, sinus congestion.  Wt Readings from Last 3 Encounters:  04/20/12 216 lb 12 oz (98.317 kg)  08/25/11 208 lb (94.348 kg)  08/26/10 205 lb 12 oz (93.328 kg)    Continues smoking 1/2 ppd. No sick contacts at home. Allergic rhinitis worse in spring and fall. No h/o asthma.  + COPD.  Past Medical History  Diagnosis Date  . Allergic rhinitis 07/1998  . COPD (chronic obstructive pulmonary disease) 07/1998    w ongoing tobacco use  . Hyperlipidemia 06/1996  . HTN (hypertension) 05/1998  . ETOH abuse   . Smoker      Review of Systems Per HPI    Objective:   Physical Exam  Nursing note and vitals reviewed. Constitutional: He appears well-developed and well-nourished. No distress.  HENT:  Head: Normocephalic and atraumatic.  Right Ear: Hearing, tympanic membrane, external ear and ear canal normal.  Left Ear: Hearing, tympanic membrane, external ear and ear canal normal.  Nose: Nose normal. No mucosal edema or rhinorrhea.  Mouth/Throat: Uvula is midline, oropharynx is clear and moist and mucous membranes are normal. No oropharyngeal exudate, posterior  oropharyngeal edema, posterior oropharyngeal erythema or tonsillar abscesses.  Eyes: Conjunctivae normal and EOM are normal. Pupils are equal, round, and reactive to light. No scleral icterus.  Neck: Normal range of motion. Neck supple.  Cardiovascular: Normal rate, regular rhythm, normal heart sounds and intact distal pulses.   No murmur heard. Pulmonary/Chest: Effort normal. No respiratory distress. He has no wheezes. He has rhonchi in the right upper field. He has no rales. He exhibits tenderness.       Tender to palpation lower anterior and less so lateral left ribcate  Lymphadenopathy:    He has no cervical adenopathy.  Skin: Skin is warm and dry. No rash noted.       Assessment & Plan:

## 2012-04-20 NOTE — Assessment & Plan Note (Signed)
Several concerning sxs for sleep apnea.  Discussed future sleep study and CPAP (currently pt hesitant to pursue w/u).

## 2012-04-20 NOTE — Patient Instructions (Signed)
Xray today. For rib pain - take tramadol as needed for discomfort - I think this is a ribcage bruise - will take 4-6 weeks to heal.  If not improving as expected, let us know. For cough - likely bronchitis - treat with zpack - sent to pharmacy.  Let us know if not improving as expected. Think about sleep study and possible CPAP use

## 2012-05-28 ENCOUNTER — Other Ambulatory Visit: Payer: Self-pay | Admitting: *Deleted

## 2012-05-28 MED ORDER — ATORVASTATIN CALCIUM 10 MG PO TABS: 10.0000 mg | ORAL_TABLET | Freq: Every day | ORAL | Status: DC

## 2012-05-28 NOTE — Addendum Note (Signed)
Addended by: Baldomero Lamy on: 05/28/2012 10:24 AM   Modules accepted: Orders

## 2012-08-11 ENCOUNTER — Other Ambulatory Visit: Payer: Self-pay | Admitting: Family Medicine

## 2012-08-11 DIAGNOSIS — E559 Vitamin D deficiency, unspecified: Secondary | ICD-10-CM

## 2012-08-11 DIAGNOSIS — Z125 Encounter for screening for malignant neoplasm of prostate: Secondary | ICD-10-CM

## 2012-08-11 DIAGNOSIS — E785 Hyperlipidemia, unspecified: Secondary | ICD-10-CM

## 2012-08-11 DIAGNOSIS — Z789 Other specified health status: Secondary | ICD-10-CM

## 2012-08-11 DIAGNOSIS — I10 Essential (primary) hypertension: Secondary | ICD-10-CM

## 2012-08-20 ENCOUNTER — Other Ambulatory Visit (INDEPENDENT_AMBULATORY_CARE_PROVIDER_SITE_OTHER): Payer: BC Managed Care – PPO

## 2012-08-20 DIAGNOSIS — E785 Hyperlipidemia, unspecified: Secondary | ICD-10-CM

## 2012-08-20 DIAGNOSIS — Z125 Encounter for screening for malignant neoplasm of prostate: Secondary | ICD-10-CM

## 2012-08-20 DIAGNOSIS — F1010 Alcohol abuse, uncomplicated: Secondary | ICD-10-CM

## 2012-08-20 DIAGNOSIS — E559 Vitamin D deficiency, unspecified: Secondary | ICD-10-CM

## 2012-08-20 DIAGNOSIS — Z789 Other specified health status: Secondary | ICD-10-CM

## 2012-08-20 LAB — COMPREHENSIVE METABOLIC PANEL
ALT: 46 U/L (ref 0–53)
AST: 27 U/L (ref 0–37)
Albumin: 4.3 g/dL (ref 3.5–5.2)
Alkaline Phosphatase: 80 U/L (ref 39–117)
BUN: 13 mg/dL (ref 6–23)
CO2: 28 mEq/L (ref 19–32)
Calcium: 9.5 mg/dL (ref 8.4–10.5)
Chloride: 103 mEq/L (ref 96–112)
Creatinine, Ser: 0.8 mg/dL (ref 0.4–1.5)
GFR: 107.5 mL/min (ref >60.00)
Glucose, Bld: 121 mg/dL — ABNORMAL HIGH (ref 70–99)
Potassium: 4.4 mEq/L (ref 3.5–5.1)
Sodium: 138 mEq/L (ref 135–145)
Total Bilirubin: 0.6 mg/dL (ref 0.3–1.2)
Total Protein: 6.7 g/dL (ref 6.0–8.3)

## 2012-08-20 LAB — LIPID PANEL
Cholesterol: 200 mg/dL (ref 0–200)
HDL: 75.8 mg/dL (ref >39.00)
LDL Cholesterol: 117 mg/dL — ABNORMAL HIGH (ref 0–99)
Total CHOL/HDL Ratio: 3
Triglycerides: 38 mg/dL (ref 0.0–149.0)
VLDL: 7.6 mg/dL (ref 0.0–40.0)

## 2012-08-20 LAB — PSA: PSA: 0.68 ng/mL (ref 0.10–4.00)

## 2012-08-20 LAB — TSH: TSH: 1.76 u[IU]/mL (ref 0.35–5.50)

## 2012-08-21 LAB — VITAMIN D 25 HYDROXY (VIT D DEFICIENCY, FRACTURES): Vit D, 25-Hydroxy: 50 ng/mL (ref 30–89)

## 2012-08-24 ENCOUNTER — Ambulatory Visit (INDEPENDENT_AMBULATORY_CARE_PROVIDER_SITE_OTHER): Payer: BC Managed Care – PPO | Admitting: Family Medicine

## 2012-08-24 ENCOUNTER — Encounter: Payer: Self-pay | Admitting: Family Medicine

## 2012-08-24 ENCOUNTER — Other Ambulatory Visit: Payer: BC Managed Care – PPO

## 2012-08-24 VITALS — BP 164/90 | HR 88 | Temp 98.4°F | Ht 66.0 in | Wt 221.8 lb

## 2012-08-24 DIAGNOSIS — Z0000 Encounter for general adult medical examination without abnormal findings: Secondary | ICD-10-CM

## 2012-08-24 DIAGNOSIS — Z23 Encounter for immunization: Secondary | ICD-10-CM

## 2012-08-24 DIAGNOSIS — E559 Vitamin D deficiency, unspecified: Secondary | ICD-10-CM

## 2012-08-24 DIAGNOSIS — E785 Hyperlipidemia, unspecified: Secondary | ICD-10-CM

## 2012-08-24 DIAGNOSIS — R7309 Other abnormal glucose: Secondary | ICD-10-CM

## 2012-08-24 DIAGNOSIS — F17200 Nicotine dependence, unspecified, uncomplicated: Secondary | ICD-10-CM

## 2012-08-24 DIAGNOSIS — E669 Obesity, unspecified: Secondary | ICD-10-CM

## 2012-08-24 DIAGNOSIS — I10 Essential (primary) hypertension: Secondary | ICD-10-CM

## 2012-08-24 NOTE — Patient Instructions (Addendum)
Keep an eye on blood pressure - if consistently >140/90, let me know. Watch sugars - work on weight loss. Tdap today We wil clean your left ear today. Return in 1 year or as needed.

## 2012-08-24 NOTE — Assessment & Plan Note (Signed)
Chronic, deteriorated - check A1c next blood work.

## 2012-08-24 NOTE — Progress Notes (Signed)
Subjective:    Patient ID: [name], male    DOB: 1951-07-17, 62 y.o.   MRN: [id number]  HPI CC: CPE  HTN - blood pressure elevated today.  No HA, vision changes, CP/tightness, SOB, leg swelling.  Compliant with lisinopril. Wt Readings from Last 3 Encounters:  08/24/12 221 lb 12 oz (100.585 kg)  04/20/12 216 lb 12 oz (98.317 kg)  08/25/11 208 lb (94.348 kg)   Obesity - sits in office all day.  No exercise.  Smoking - 1/2 ppd.  Not interested in chantix.  Requests ear cleaning today.  Has not tried peroxide.  No trouble hearing.  Preventative:  Colonoscopy 09/23/2010 - polyps, diverticula, hemorrhoids. Rpt 5 yrs. Elnoria Howard). Prostate - DRE today, PSA reassuring. Nocturia x3-4. Strong stream.  Discussed spacing out checks to Q2 yrs. Flu shot - done Tdap today  Lives with wife. Has 1 son, 2 daughters.  Occupation: Event organiser.  Activity: no exercise Diet: good water, fruits/vegetables daily  Medications and allergies reviewed and updated in chart.  Past histories reviewed and updated if relevant as below. Patient Active Problem List  Diagnosis  . ONYCHOMYCOSIS, BILATERAL  . HYPERLIPIDEMIA  . HYPERTENSION  . PREMATURE VENTRICULAR CONTRACTIONS  . PVD  . ALLERGIC RHINITIS  . COPD  . DEGENERATIVE DISC DISEASE, CERVICAL SPINE  . DYSPNEA  . SNORING  . HYPERGLYCEMIA  . HEPATITIS C, HX OF  . VITAMIN D DEFICIENCY  . HYPERPOTASSEMIA  . OBESITY  . HYPERTROPHY PROSTATE W/UR OBST & OTH LUTS  . Smoker  . Health maintenance examination  . Heavy alcohol use  . Cough  . Rib pain   Past Medical History  Diagnosis Date  . Allergic rhinitis 07/1998  . COPD (chronic obstructive pulmonary disease) 07/1998    w ongoing tobacco use  . Hyperlipidemia 06/1996  . HTN (hypertension) 05/1998  . ETOH abuse   . Smoker    Past Surgical History  Procedure Date  . Back surgery 2007  . Neck fusion 2003  . Colonoscopy 09/2010    polyps, diverticula, hemorrhoids.  Rpt 5 yrs  .  Ett 2010    normal, ABIs normal   History  Substance Use Topics  . Smoking status: Current Every Day Smoker -- 0.5 packs/day    Types: Cigarettes  . Smokeless tobacco: Never Used     Comment: 1 ppd X 30 years  . Alcohol Use: Yes     Comment: 6 beers/day   Family History  Problem Relation Age of Onset  . Coronary artery disease Mother   . Hypertension Mother   . Stroke Mother   . Coronary artery disease Father   . Hypertension Father   . Diabetes Father   . Stroke Father   . Pneumonia Sister   . Irregular heart beat Brother   . Maple syrup urine disease Brother    No Known Allergies Current Outpatient Prescriptions on File Prior to Visit  Medication Sig Dispense Refill  . aspirin 81 MG tablet Take 81 mg by mouth daily.       [name] atorvastatin (LIPITOR) 10 MG tablet Take 1 tablet (10 mg total) by mouth daily.  90 tablet  0  . budesonide-formoterol (SYMBICORT) 80-4.5 MCG/ACT inhaler Inhale 2 puffs into the lungs daily as needed.      [name] lisinopril (PRINIVIL,ZESTRIL) 10 MG tablet Take 1 tablet (10 mg total) by mouth daily.  90 tablet  2  . tadalafil (CIALIS) 20 MG tablet Take 20 mg by mouth daily.  Review of Systems  Constitutional: Positive for unexpected weight change (weight gain). Negative for fever, chills, activity change, appetite change and fatigue.  HENT: Negative for hearing loss and neck pain.   Eyes: Negative for visual disturbance.  Respiratory: Negative for cough, chest tightness, shortness of breath and wheezing.   Cardiovascular: Negative for chest pain, palpitations and leg swelling.  Gastrointestinal: Negative for nausea, vomiting, abdominal pain, diarrhea, constipation, blood in stool and abdominal distention.  Genitourinary: Negative for hematuria and difficulty urinating.  Musculoskeletal: Negative for myalgias and arthralgias.  Skin: Negative for rash.  Neurological: Negative for dizziness, seizures, syncope and headaches.  Hematological: Does not  bruise/bleed easily.  Psychiatric/Behavioral: Negative for dysphoric mood. The patient is not nervous/anxious.        Objective:   Physical Exam  Nursing note and vitals reviewed. Constitutional: He is oriented to person, place, and time. He appears well-developed and well-nourished. No distress.  HENT:  Head: Normocephalic and atraumatic.  Right Ear: Hearing, tympanic membrane and external ear normal.  Left Ear: Hearing and external ear normal.  Nose: Nose normal.  Mouth/Throat: Oropharynx is clear and moist. No oropharyngeal exudate.       L cerumen impaction affecting hearing - cleaned with irrigation today  Eyes: Conjunctivae normal and EOM are normal. Pupils are equal, round, and reactive to light. No scleral icterus.  Neck: Normal range of motion. Neck supple. Carotid bruit is not present.  Cardiovascular: Normal rate, regular rhythm, normal heart sounds and intact distal pulses.   No murmur heard. Pulses:      Radial pulses are 2+ on the right side, and 2+ on the left side.  Pulmonary/Chest: Effort normal and breath sounds normal. No respiratory distress. He has no wheezes. He has no rales.  Abdominal: Soft. Bowel sounds are normal. He exhibits no distension and no mass. There is no tenderness. There is no rebound and no guarding.       obese  Genitourinary: Rectum normal and prostate normal. Rectal exam shows no external hemorrhoid, no internal hemorrhoid, no fissure, no mass, no tenderness and anal tone normal. Prostate is not enlarged (10-15gm) and not tender.  Musculoskeletal: Normal range of motion. He exhibits no edema.  Lymphadenopathy:    He has no cervical adenopathy.  Neurological: He is alert and oriented to person, place, and time.       CN grossly intact, station and gait intact  Skin: Skin is warm and dry. No rash noted.  Psychiatric: He has a normal mood and affect. His behavior is normal. Judgment and thought content normal.      Assessment & Plan:

## 2012-08-24 NOTE — Assessment & Plan Note (Signed)
Chronic, deteriorated. I have asked him to check BP at home and notify me if consistently elevated to increase ACEI and return sooner for f/u. Pt agrees with plan. Encouraged weight loss.

## 2012-08-24 NOTE — Assessment & Plan Note (Signed)
Continued weight gain noted. Encouraged working on weight loss. Body mass index is 35.79 kg/(m^2).

## 2012-08-24 NOTE — Assessment & Plan Note (Signed)
Chronic, stable on lipitor.  

## 2012-08-24 NOTE — Assessment & Plan Note (Signed)
Good levels on 1000 IU daily.

## 2012-08-24 NOTE — Assessment & Plan Note (Signed)
Continue to encourage smoking cessation.  Discussed Ecig today, pros/cons.

## 2012-08-24 NOTE — Assessment & Plan Note (Signed)
Preventative protocols reviewed and updated unless pt declined. Discussed healthy diet and lifestyle. Tdap today. Will space out prostate screening to Q2 yrs as normal longterm.

## 2012-08-24 NOTE — Addendum Note (Signed)
Addended by: Josph Macho A on: 08/24/2012 10:13 AM   Modules accepted: Orders

## 2012-08-27 ENCOUNTER — Other Ambulatory Visit: Payer: Self-pay | Admitting: *Deleted

## 2012-08-27 MED ORDER — BUDESONIDE-FORMOTEROL FUMARATE 80-4.5 MCG/ACT IN AERO: 2.0000 | INHALATION_SPRAY | Freq: Every day | RESPIRATORY_TRACT | Status: DC | PRN

## 2012-11-30 ENCOUNTER — Other Ambulatory Visit: Payer: Self-pay | Admitting: Family Medicine

## 2012-11-30 MED ORDER — ATORVASTATIN CALCIUM 10 MG PO TABS: 10.0000 mg | ORAL_TABLET | Freq: Every day | ORAL | Status: DC

## 2012-12-31 ENCOUNTER — Other Ambulatory Visit: Payer: Self-pay | Admitting: *Deleted

## 2012-12-31 MED ORDER — LISINOPRIL 10 MG PO TABS: 10.0000 mg | ORAL_TABLET | Freq: Every day | ORAL | Status: DC

## 2013-01-14 ENCOUNTER — Ambulatory Visit (INDEPENDENT_AMBULATORY_CARE_PROVIDER_SITE_OTHER): Payer: BC Managed Care – PPO | Admitting: Cardiovascular Disease

## 2013-01-14 ENCOUNTER — Encounter: Payer: Self-pay | Admitting: Cardiovascular Disease

## 2013-01-14 VITALS — BP 150/88 | HR 84 | Ht 67.0 in | Wt 221.8 lb

## 2013-01-14 DIAGNOSIS — R0602 Shortness of breath: Secondary | ICD-10-CM

## 2013-01-14 DIAGNOSIS — F17200 Nicotine dependence, unspecified, uncomplicated: Secondary | ICD-10-CM

## 2013-01-14 DIAGNOSIS — R079 Chest pain, unspecified: Secondary | ICD-10-CM | POA: Insufficient documentation

## 2013-01-14 DIAGNOSIS — R0609 Other forms of dyspnea: Secondary | ICD-10-CM

## 2013-01-14 DIAGNOSIS — I10 Essential (primary) hypertension: Secondary | ICD-10-CM

## 2013-01-14 DIAGNOSIS — R0989 Other specified symptoms and signs involving the circulatory and respiratory systems: Secondary | ICD-10-CM

## 2013-01-14 DIAGNOSIS — R000 Tachycardia, unspecified: Secondary | ICD-10-CM

## 2013-01-14 NOTE — Assessment & Plan Note (Signed)
I discussed with him the importance of smoking cessation. 

## 2013-01-14 NOTE — Assessment & Plan Note (Signed)
The patient reports significant worsening of exertional dyspnea over the last year which could be multifactorial due to COPD with continued smoking, probable sleep apnea and overall physical deconditioning due to lack of exercise and weight gain. However, other cardiac etiologies such as diastolic dysfunction and pulmonary hypertension wiill need to be evaluated. I will obtain an echocardiogram.

## 2013-01-14 NOTE — Assessment & Plan Note (Signed)
The patient main complaint is dyspnea with minimal activities which has been progressive over the last year. He also has intermittent episodes of substernal chest tightness mostly at rest lasting for brief period. He has multiple and significant risk factors for coronary artery disease. Thus, I recommend evaluation with a pharmacologic nuclear stress test. He is not able to exercise on a treadmill due to chronic back pain and right knee arthritis. I will have a low threshold for cardiac catheterization.

## 2013-01-14 NOTE — Assessment & Plan Note (Signed)
He has classic symptoms of sleep apnea and should get evaluated with a sleep study. He has refused a sleep study in the past.

## 2013-01-14 NOTE — Progress Notes (Signed)
HPI  This is a 62 year old man who is here today to reestablish cardiovascular care. He was seen in the past by Dr. Gala Romney most recently in 2010. He was evaluated at that time for dyspnea and PVCs. He has known history of hypertension, hyperlipidemia, COPD and tobacco use.  Echo in 2010 showed EF 60-65% with mild diastolic dysfunction. Mild RAE.  ABIs were normal. PFTs showed moderate COPD (FEV1/FVC = 60% predicted) particularly in small airways (FEF 25-75% = 36% predicted) with normal DLCO and good response to bronchdilators.  Treadmill stress test showed no evidence of ischemia. The patient had shoulder surgery and suffers from chronic back pain due to herniated disc. He reports a 25 pound weight gain over the last 2 years. He has not been able to do much exercise. He reports significant worsening and exertional dyspnea which is now happening with minimal activities even walking to his mailbox. His wife reports episodes of substernal chest pain when he grabs his chest with his fest. Most of these episodes happen at rest and were brief. He reports quality sleep and wakes up frequently in the 1-2 hours for unclear reasons. He does snore loudly according to his wife. He feels tired during the day. Unfortunately, he continues to smoke.   he does have strong family history of coronary artery disease. Both parents at CABG in their early 8s.   No Known Allergies   Current Outpatient Prescriptions on File Prior to Visit  Medication Sig Dispense Refill  . aspirin 81 MG tablet Take 81 mg by mouth daily.       [name] atorvastatin (LIPITOR) 10 MG tablet Take 1 tablet (10 mg total) by mouth daily.  90 tablet  0  . budesonide-formoterol (SYMBICORT) 80-4.5 MCG/ACT inhaler Inhale 2 puffs into the lungs daily as needed.  1 Inhaler  3  . cholecalciferol (VITAMIN D) 1000 UNITS tablet Take 1,000 Units by mouth daily.      [name] lisinopril (PRINIVIL,ZESTRIL) 10 MG tablet Take 1 tablet (10 mg total) by mouth daily.   90 tablet  1  . tadalafil (CIALIS) 20 MG tablet Take 20 mg by mouth daily.         No current facility-administered medications on file prior to visit.     Past Medical History  Diagnosis Date  . Allergic rhinitis 07/1998  . COPD (chronic obstructive pulmonary disease) 07/1998    w ongoing tobacco use  . Hyperlipidemia 06/1996  . HTN (hypertension) 05/1998  . ETOH abuse   . Smoker   . Sciatic nerve injury      Past Surgical History  Procedure Laterality Date  . Back surgery  2007  . Neck fusion  2003  . Colonoscopy  09/2010    polyps, diverticula, hemorrhoids.  Rpt 5 yrs  . Ett  2010    normal, ABIs normal     Family History  Problem Relation Age of Onset  . Coronary artery disease Mother   . Hypertension Mother   . Stroke Mother   . Coronary artery disease Father   . Hypertension Father   . Diabetes Father   . Stroke Father   . Pneumonia Sister   . Irregular heart beat Brother   . Maple syrup urine disease Brother   . Cancer Neg Hx      History   Social History  . Marital Status: Married    Spouse Name: N/A    Number of Children: N/A  . Years of Education: N/A  Occupational History  . Not on file.   Social History Main Topics  . Smoking status: Current Every Day Smoker -- 0.50 packs/day for 40 years    Types: Cigarettes  . Smokeless tobacco: Never Used     Comment: 1 ppd X 30 years  . Alcohol Use: Yes     Comment: 6 beers/day  . Drug Use: No  . Sexually Active: Not on file   Other Topics Concern  . Not on file   Social History Narrative   Lives with wife. Has 1 son, 2 daughters.    Occupation: Event organiser.    Activity: no exercise   Diet: good water, fruits/vegetables daily     ROS Constitutional: Negative for fever, chills, diaphoresis, activity change, appetite change and fatigue.  HENT: Negative for hearing loss, nosebleeds, congestion, sore throat, facial swelling, drooling, trouble swallowing, neck pain, voice change, sinus  pressure and tinnitus.  Eyes: Negative for photophobia, pain, discharge and visual disturbance.  Respiratory: Negative for apnea, cough and wheezing.  Cardiovascular: Positive for chest pain, palpitations and leg swelling.  Gastrointestinal: Negative for nausea, vomiting, abdominal pain, diarrhea, constipation, blood in stool and abdominal distention.  Genitourinary: Negative for dysuria, urgency, frequency, hematuria and decreased urine volume.  Musculoskeletal: Negative for myalgias, back pain, joint swelling, arthralgias and gait problem.  Skin: Negative for color change, pallor, rash and wound.  Neurological: Negative for dizziness, tremors, seizures, syncope, speech difficulty, weakness, light-headedness, numbness and headaches.  Psychiatric/Behavioral: Negative for suicidal ideas, hallucinations, behavioral problems and agitation. The patient is not nervous/anxious.     PHYSICAL EXAM   BP 150/88  Pulse 84  Ht 5\' 7"  (1.702 m)  Wt 221 lb 12 oz (100.585 kg)  BMI 34.72 kg/m2 Constitutional: He is oriented to person, place, and time. He appears well-developed and well-nourished. No distress.  HENT: No nasal discharge.  Head: Normocephalic and atraumatic.  Eyes: Pupils are equal and round. Right eye exhibits no discharge. Left eye exhibits no discharge.  Neck: Normal range of motion. Neck supple. No JVD present. No thyromegaly present.  Cardiovascular: Normal rate, regular rhythm, normal heart sounds and. Exam reveals no gallop and no friction rub. No murmur heard.  Pulmonary/Chest: Effort normal and breath sounds normal. No stridor. No respiratory distress. He has no wheezes. He has no rales. He exhibits no tenderness.  Abdominal: Soft. Bowel sounds are normal. He exhibits no distension. There is no tenderness. There is no rebound and no guarding.  Musculoskeletal: Normal range of motion. He exhibits trace edema and no tenderness.  Neurological: He is alert and oriented to person,  place, and time. Coordination normal.  Skin: Skin is warm and dry. No rash noted. He is not diaphoretic. No erythema. No pallor.  Psychiatric: He has a normal mood and affect. His behavior is normal. Judgment and thought content normal.       ZOX:WRUEA  Rhythm  - occasional PAC    # PACs = 1. WITHIN NORMAL LIMITS   ASSESSMENT AND PLAN

## 2013-01-14 NOTE — Patient Instructions (Addendum)
Your physician has requested that you have an echocardiogram. Echocardiography is a painless test that uses sound waves to create images of your heart. It provides your doctor with information about the size and shape of your heart and how well your heart's chambers and valves are working. This procedure takes approximately one hour. There are no restrictions for this procedure.  Your physician has requested that you have a lexiscan myoview. For further information please visit [email or web address]. Please follow instruction sheet, as given.  Follow up after tests.  

## 2013-01-14 NOTE — Assessment & Plan Note (Signed)
His blood pressure is elevated today and he might require additional blood pressure medications.

## 2013-01-15 HISTORY — PX: US ECHOCARDIOGRAPHY: HXRAD669

## 2013-01-17 ENCOUNTER — Ambulatory Visit: Payer: Self-pay | Admitting: Cardiovascular Disease

## 2013-01-21 ENCOUNTER — Other Ambulatory Visit: Payer: Self-pay

## 2013-01-21 DIAGNOSIS — R0602 Shortness of breath: Secondary | ICD-10-CM

## 2013-01-21 DIAGNOSIS — R079 Chest pain, unspecified: Secondary | ICD-10-CM

## 2013-02-05 ENCOUNTER — Other Ambulatory Visit (INDEPENDENT_AMBULATORY_CARE_PROVIDER_SITE_OTHER): Payer: BC Managed Care – PPO

## 2013-02-05 ENCOUNTER — Other Ambulatory Visit: Payer: Self-pay

## 2013-02-05 DIAGNOSIS — R0602 Shortness of breath: Secondary | ICD-10-CM

## 2013-02-06 ENCOUNTER — Encounter: Payer: Self-pay | Admitting: Family Medicine

## 2013-02-08 ENCOUNTER — Ambulatory Visit (INDEPENDENT_AMBULATORY_CARE_PROVIDER_SITE_OTHER): Payer: BC Managed Care – PPO | Admitting: Cardiovascular Disease

## 2013-02-08 ENCOUNTER — Encounter: Payer: Self-pay | Admitting: Cardiovascular Disease

## 2013-02-08 VITALS — BP 148/84 | HR 78 | Ht 67.0 in | Wt 224.0 lb

## 2013-02-08 DIAGNOSIS — I739 Peripheral vascular disease, unspecified: Secondary | ICD-10-CM

## 2013-02-08 DIAGNOSIS — R0609 Other forms of dyspnea: Secondary | ICD-10-CM

## 2013-02-08 DIAGNOSIS — R079 Chest pain, unspecified: Secondary | ICD-10-CM

## 2013-02-08 DIAGNOSIS — R0602 Shortness of breath: Secondary | ICD-10-CM

## 2013-02-08 DIAGNOSIS — J449 Chronic obstructive pulmonary disease, unspecified: Secondary | ICD-10-CM

## 2013-02-08 DIAGNOSIS — R0989 Other specified symptoms and signs involving the circulatory and respiratory systems: Secondary | ICD-10-CM

## 2013-02-08 NOTE — Assessment & Plan Note (Signed)
Does not seem to be cardiac. Recent stress test showed no evidence of ischemia. He is still concerned about his family history of premature coronary artery disease. I explained to him that proceeding with cardiac catheterization is an option if there is no other explanation for his symptoms. I think we have to evaluate his pulmonary status first.

## 2013-02-08 NOTE — Assessment & Plan Note (Signed)
The patient has symptoms highly suggestive of sleep apnea. Consider a sleep study.

## 2013-02-08 NOTE — Assessment & Plan Note (Signed)
The patient reports a family history of abdominal aortic aneurysm. Due to his age and smoking status, I recommend screening with abdominal aortic ultrasound. I discussed this with the patient and he wants to hold off at this time.

## 2013-02-08 NOTE — Progress Notes (Signed)
HPI  This is a 62 year old man who is here today for a followup visit . He was seen in the past by Dr. Gala Romney most recently in 2010. He was evaluated at that time for dyspnea and PVCs. He has known history of hypertension, hyperlipidemia, COPD and tobacco use.  PFTs in 2010 showed moderate COPD (FEV1/FVC = 60% predicted) particularly in small airways (FEF 25-75% = 36% predicted) with normal DLCO and good response to bronchdilators.  The patient had shoulder surgery and suffers from chronic back pain due to herniated disc. He reports a 25 pound weight gain over the last 2 years. He has not been able to do much exercise. He reports significant worsening and exertional dyspnea which is now happening with minimal activities even walking to his mailbox. His wife reports episodes of substernal chest pain when he grabs his chest with his fest. Most of these episodes happen at rest and were brief. He reports quality sleep and wakes up frequently in the 1-2 hours for unclear reasons. He does snore loudly according to his wife. He feels tired during the day. Unfortunately, he continues to smoke.   he does have strong family history of coronary artery disease. Both parents at CABG in their early 48s. I proceeded with cardiac evaluation which included a nuclear stress test which showed no evidence of ischemia with normal ejection fraction. Echocardiogram was also unremarkable except for mild grade 1 diastolic dysfunction without significant pulmonary hypertension.   No Known Allergies   Current Outpatient Prescriptions on File Prior to Visit  Medication Sig Dispense Refill  . aspirin 81 MG tablet Take 81 mg by mouth daily.       [name] atorvastatin (LIPITOR) 10 MG tablet Take 1 tablet (10 mg total) by mouth daily.  90 tablet  0  . budesonide-formoterol (SYMBICORT) 80-4.5 MCG/ACT inhaler Inhale 2 puffs into the lungs daily as needed.  1 Inhaler  3  . cholecalciferol (VITAMIN D) 1000 UNITS tablet Take 1,000  Units by mouth daily.      [name] gabapentin (NEURONTIN) 300 MG capsule Take 300 mg by mouth 3 (three) times daily.       [name] HYDROcodone-acetaminophen (NORCO) 10-325 MG per tablet Take 1 tablet by mouth every 4 (four) hours as needed.       [name] lisinopril (PRINIVIL,ZESTRIL) 10 MG tablet Take 1 tablet (10 mg total) by mouth daily.  90 tablet  1  . tadalafil (CIALIS) 20 MG tablet Take 20 mg by mouth daily.         No current facility-administered medications on file prior to visit.     Past Medical History  Diagnosis Date  . Allergic rhinitis 07/1998  . COPD (chronic obstructive pulmonary disease) 07/1998    w ongoing tobacco use  . Hyperlipidemia 06/1996  . HTN (hypertension) 05/1998  . ETOH abuse   . Smoker   . Sciatic nerve injury      Past Surgical History  Procedure Laterality Date  . Back surgery  2007  . Neck fusion  2003  . Colonoscopy  09/2010    polyps, diverticula, hemorrhoids.  Rpt 5 yrs  . Ett  2010    normal, ABIs normal  . US echocardiography  01/2013    WNL, mild diastolic dysfunction     Family History  Problem Relation Age of Onset  . Coronary artery disease Mother   . Hypertension Mother   . Stroke Mother   . Coronary artery disease Father   .  Hypertension Father   . Diabetes Father   . Stroke Father   . Pneumonia Sister   . Irregular heart beat Brother   . Maple syrup urine disease Brother   . Cancer Neg Hx      History   Social History  . Marital Status: Married    Spouse Name: N/A    Number of Children: N/A  . Years of Education: N/A   Occupational History  . Not on file.   Social History Main Topics  . Smoking status: Current Every Day Smoker -- 0.50 packs/day for 40 years    Types: Cigarettes  . Smokeless tobacco: Never Used     Comment: 1 ppd X 30 years  . Alcohol Use: Yes     Comment: 6 beers/day  . Drug Use: No  . Sexually Active: Not on file   Other Topics Concern  . Not on file   Social History Narrative   Lives with wife.  Has 1 son, 2 daughters.    Occupation: Event organiser.    Activity: no exercise   Diet: good water, fruits/vegetables daily       PHYSICAL EXAM   BP 148/84  Pulse 78  Ht 5\' 7"  (1.702 m)  Wt 224 lb (101.606 kg)  BMI 35.08 kg/m2 Constitutional: He is oriented to person, place, and time. He appears well-developed and well-nourished. No distress.  HENT: No nasal discharge.  Head: Normocephalic and atraumatic.  Eyes: Pupils are equal and round. Right eye exhibits no discharge. Left eye exhibits no discharge.  Neck: Normal range of motion. Neck supple. No JVD present. No thyromegaly present.  Cardiovascular: Normal rate, regular rhythm, normal heart sounds and. Exam reveals no gallop and no friction rub. No murmur heard.  Pulmonary/Chest: Effort normal and breath sounds normal. No stridor. No respiratory distress. He has no wheezes. He has no rales. He exhibits no tenderness.  Abdominal: Soft. Bowel sounds are normal. He exhibits no distension. There is no tenderness. There is no rebound and no guarding.  Musculoskeletal: Normal range of motion. He exhibits trace edema and no tenderness.  Neurological: He is alert and oriented to person, place, and time. Coordination normal.  Skin: Skin is warm and dry. No rash noted. He is not diaphoretic. No erythema. No pallor.  Psychiatric: He has a normal mood and affect. His behavior is normal. Judgment and thought content normal.     ASSESSMENT AND PLAN

## 2013-02-08 NOTE — Patient Instructions (Addendum)
Refer to Dr. Kendrick Fries for evaluation of shortness of breath.  Follow up in 3 months.

## 2013-02-08 NOTE — Assessment & Plan Note (Signed)
Likely multifactorial due to COPD, continued tobacco use and physical deconditioning. I will refer him to Dr. Kendrick Fries to optimize his pulmonary status.

## 2013-02-15 HISTORY — PX: OTHER SURGICAL HISTORY: SHX169

## 2013-03-05 ENCOUNTER — Ambulatory Visit (INDEPENDENT_AMBULATORY_CARE_PROVIDER_SITE_OTHER): Payer: BC Managed Care – PPO | Admitting: Pulmonary Disease

## 2013-03-05 ENCOUNTER — Encounter: Payer: Self-pay | Admitting: Pulmonary Disease

## 2013-03-05 VITALS — BP 140/72 | HR 78 | Temp 98.9°F | Ht 67.0 in | Wt 226.0 lb

## 2013-03-05 DIAGNOSIS — E669 Obesity, unspecified: Secondary | ICD-10-CM

## 2013-03-05 DIAGNOSIS — F17200 Nicotine dependence, unspecified, uncomplicated: Secondary | ICD-10-CM

## 2013-03-05 DIAGNOSIS — J449 Chronic obstructive pulmonary disease, unspecified: Secondary | ICD-10-CM

## 2013-03-05 DIAGNOSIS — R0602 Shortness of breath: Secondary | ICD-10-CM

## 2013-03-05 LAB — SPIROMETRY WITH GRAPH

## 2013-03-05 MED ORDER — VARENICLINE TARTRATE 0.5 MG PO TABS: 0.5000 mg | ORAL_TABLET | Freq: Two times a day (BID) | ORAL | Status: DC

## 2013-03-05 NOTE — Assessment & Plan Note (Signed)
Discussed at length today.  He has tried multiple drugs/remedies in the past.  He wants to try Chantix again.

## 2013-03-05 NOTE — Patient Instructions (Signed)
Quit smoking, quit smoking, quit smoking Try taking the Chantix again, I sent in an Rx for a starter pack Try taking the spiriva instead of the symbicort and let us know if you like it better than symbicort so we can send in an Rx  For the cough, quit smoking; also try delsym as needed for cough  We will send you for a chest x-ray  We will see you back in 4 months or sooner if needed

## 2013-03-05 NOTE — Assessment & Plan Note (Signed)
Advised weight loss at length as I think it would make a huge difference in his breathing.  He likely has obstructive sleep apnea, but wants to put off th test until he sees Dr. Kirke Corin again.  I advised weight loss through diet and exercise between now and then.

## 2013-03-05 NOTE — Assessment & Plan Note (Addendum)
He has COPD based on simple spirometry.  His depressed FVC suggests restriction (likely due to being overweight), so I can't quantify his airflow obstruction.    At this point the most important things to do are to quit smoking and to exercise and lose weight.    He does not have exacerbations, so I think that he is really GOLD A or B despite the FEV1 of 45% pred.  Plan: -CXR -try switching symbicort to spiriva to see if it makes a difference -exercise regularly -lose weight -flu shot -f/u 4 months

## 2013-03-05 NOTE — Progress Notes (Signed)
Subjective:    Patient ID: [name], male    DOB: February 27, 1951, 62 y.o.   MRN: [id number]  HPI  [name] is a very pleasant 62 year old male who comes her clinic today for evaluation of COPD. He started smoking as a teenager and has smoked one half to one pack daily since then. He quit for one year approximately 10 years ago but then unfortunately started back. History adequate 7 times. He is try Chantix which unfortunately he believes caused migraine headaches. He has also tried a patch and gum in the past which has not been effective. He states that for the last several years he has gained 30 pounds has his job has become more sedentary. He gets short of breath with any activity such as bending over or excessive walking. He states he believes he can walk up to 1 mile on level ground but is more frequently limited by knee pain and shortness of breath. He can climb a flight of stairs but feel short of breath at the top. He states that he has learned to "pace himself" in order to avoid shortness of breath. To his knowledge she's never had true spirometry. He continues to smoke cigarettes.  He states that he has a daily cough which is almost always productive of clear to brown sputum.  He does not exercise regularly.  He was put on Symbicort 7 years ago because of a diagnosis of COPD. He did not really use it on a routine basis and at this point uses it 4-5 times a week as needed.  He recently underwent a cardiac workup which included a nuclear stress test and echo which were normal.   Past Medical History  Diagnosis Date  . Allergic rhinitis 07/1998  . COPD (chronic obstructive pulmonary disease) 07/1998    w ongoing tobacco use  . Hyperlipidemia 06/1996  . HTN (hypertension) 05/1998  . ETOH abuse   . Smoker   . Sciatic nerve injury      Family History  Problem Relation Age of Onset  . Coronary artery disease Mother   . Hypertension Mother   . Stroke Mother   . Coronary artery  disease Father   . Hypertension Father   . Diabetes Father   . Stroke Father   . Pneumonia Sister   . Irregular heart beat Brother   . Maple syrup urine disease Brother   . Cancer Neg Hx      History   Social History  . Marital Status: Married    Spouse Name: N/A    Number of Children: N/A  . Years of Education: N/A   Occupational History  . Not on file.   Social History Main Topics  . Smoking status: Current Every Day Smoker -- 0.50 packs/day for 40 years    Types: Cigarettes  . Smokeless tobacco: Never Used     Comment: 1 ppd X 30 years  . Alcohol Use: Yes     Comment: 6 beers/day  . Drug Use: No  . Sexual Activity: Not on file   Other Topics Concern  . Not on file   Social History Narrative   Lives with wife. Has 1 son, 2 daughters.    Occupation: Event organiser.    Activity: no exercise   Diet: good water, fruits/vegetables daily     No Known Allergies   Outpatient Prescriptions Prior to Visit  Medication Sig Dispense Refill  . aspirin 81 MG tablet Take 81 mg by mouth daily.       [name]  atorvastatin (LIPITOR) 10 MG tablet Take 1 tablet (10 mg total) by mouth daily.  90 tablet  0  . budesonide-formoterol (SYMBICORT) 80-4.5 MCG/ACT inhaler Inhale 2 puffs into the lungs daily as needed.  1 Inhaler  3  . cholecalciferol (VITAMIN D) 1000 UNITS tablet Take 1,000 Units by mouth daily.      [name] gabapentin (NEURONTIN) 300 MG capsule Take 300 mg by mouth 3 (three) times daily.       [name] HYDROcodone-acetaminophen (NORCO) 10-325 MG per tablet Take 1 tablet by mouth every 4 (four) hours as needed.       [name] lisinopril (PRINIVIL,ZESTRIL) 10 MG tablet Take 1 tablet (10 mg total) by mouth daily.  90 tablet  1  . tadalafil (CIALIS) 20 MG tablet Take 20 mg by mouth daily.         No facility-administered medications prior to visit.      Review of Systems  Constitutional: Negative for fever, chills, diaphoresis, activity change, appetite change, fatigue and unexpected weight  change.  HENT: Positive for congestion. Negative for hearing loss, ear pain, nosebleeds, sore throat, facial swelling, rhinorrhea, sneezing, mouth sores, trouble swallowing, neck pain, neck stiffness, dental problem, voice change, postnasal drip, sinus pressure, tinnitus and ear discharge.   Eyes: Negative for photophobia, discharge, itching and visual disturbance.  Respiratory: Positive for shortness of breath. Negative for apnea, cough, choking, chest tightness, wheezing and stridor.   Cardiovascular: Negative for chest pain, palpitations and leg swelling.  Gastrointestinal: Negative for nausea, vomiting, abdominal pain, constipation, blood in stool and abdominal distention.  Genitourinary: Negative for dysuria, urgency, frequency, hematuria, flank pain, decreased urine volume and difficulty urinating.  Musculoskeletal: Negative for myalgias, back pain, joint swelling, arthralgias and gait problem.  Skin: Negative for color change, pallor and rash.  Neurological: Negative for dizziness, tremors, seizures, syncope, speech difficulty, weakness, light-headedness, numbness and headaches.  Hematological: Negative for adenopathy. Does not bruise/bleed easily.  Psychiatric/Behavioral: Negative for confusion, sleep disturbance and agitation. The patient is not nervous/anxious.        Objective:   Physical Exam  Filed Vitals:   03/05/13 1503  BP: 140/72  Pulse: 78  Temp: 98.9 F (37.2 C)  TempSrc: Oral  Height: 5\' 7"  (1.702 m)  Weight: 226 lb (102.513 kg)  SpO2: 96%   Gen: obese, well appearing, no acute distress HEENT: NCAT, PERRL, EOMi, OP clear, neck supple without masses PULM: Exp wheezing in bases bilaterally, normal percussion CV: RRR, no mgr, no JVD AB: large girth, BS+, soft, nontender, no hsm Ext: warm, trace edema, no clubbing, no cyanosis Derm: no rash or skin breakdown Neuro: A&Ox4, CN II-XII intact, strength 5/5 in all 4 extremities  03/05/2013 simple spirometry> ratio 66%,  FEV1 1.53 L (45% predicted), FVC 55% predicted     Assessment & Plan:   COPD He has COPD based on simple spirometry.  His depressed FVC suggests restriction (likely due to being overweight), so I can't quantify his airflow obstruction.    At this point the most important things to do are to quit smoking and to exercise and lose weight.    He does not have exacerbations, so I think that he is really GOLD A or B despite the FEV1 of 45% pred.  Plan: -CXR -try switching symbicort to spiriva to see if it makes a difference -exercise regularly -lose weight -flu shot -f/u 4 months  Smoker Discussed at length today.  He has tried multiple drugs/remedies in the past.  He wants to try  Chantix again.  OBESITY Advised weight loss at length as I think it would make a huge difference in his breathing.  He likely has obstructive sleep apnea, but wants to put off th test until he sees Dr. Kirke Corin again.  I advised weight loss through diet and exercise between now and then.    Updated Medication List Outpatient Encounter Prescriptions as of 03/05/2013  Medication Sig Dispense Refill  . aspirin 81 MG tablet Take 81 mg by mouth daily.       [name] atorvastatin (LIPITOR) 10 MG tablet Take 1 tablet (10 mg total) by mouth daily.  90 tablet  0  . budesonide-formoterol (SYMBICORT) 80-4.5 MCG/ACT inhaler Inhale 2 puffs into the lungs daily as needed.  1 Inhaler  3  . cholecalciferol (VITAMIN D) 1000 UNITS tablet Take 1,000 Units by mouth daily.      [name] gabapentin (NEURONTIN) 300 MG capsule Take 300 mg by mouth 3 (three) times daily.       [name] HYDROcodone-acetaminophen (NORCO) 10-325 MG per tablet Take 1 tablet by mouth every 4 (four) hours as needed.       [name] lisinopril (PRINIVIL,ZESTRIL) 10 MG tablet Take 1 tablet (10 mg total) by mouth daily.  90 tablet  1  . tadalafil (CIALIS) 20 MG tablet Take 20 mg by mouth daily.        . varenicline (CHANTIX) 0.5 MG tablet Take 1 tablet (0.5 mg total) by mouth 2 (two) times  daily.  11 tablet  0   No facility-administered encounter medications on file as of 03/05/2013.

## 2013-03-06 ENCOUNTER — Telehealth: Payer: Self-pay | Admitting: Pulmonary Disease

## 2013-03-06 ENCOUNTER — Encounter: Payer: Self-pay | Admitting: Family Medicine

## 2013-03-06 MED ORDER — VARENICLINE TARTRATE 0.5 MG PO TABS: 0.5000 mg | ORAL_TABLET | Freq: Two times a day (BID) | ORAL | Status: DC

## 2013-03-06 NOTE — Telephone Encounter (Signed)
I spoke with pharmacy. Gave them VO for this. Nothing further needed

## 2013-03-08 ENCOUNTER — Ambulatory Visit (INDEPENDENT_AMBULATORY_CARE_PROVIDER_SITE_OTHER)
Admission: RE | Admit: 2013-03-08 | Discharge: 2013-03-08 | Disposition: A | Discharge status: Home / Self Care | Payer: BC Managed Care – PPO | Source: Ambulatory Visit | Attending: Pulmonary Disease | Admitting: Pulmonary Disease

## 2013-03-08 DIAGNOSIS — R0602 Shortness of breath: Secondary | ICD-10-CM

## 2013-03-13 ENCOUNTER — Other Ambulatory Visit: Payer: Self-pay | Admitting: Neurosurgery

## 2013-03-18 DIAGNOSIS — M48061 Spinal stenosis, lumbar region without neurogenic claudication: Secondary | ICD-10-CM

## 2013-03-18 HISTORY — DX: Spinal stenosis, lumbar region without neurogenic claudication: M48.061

## 2013-03-20 ENCOUNTER — Encounter (HOSPITAL_COMMUNITY): Payer: Self-pay | Admitting: Pharmacy Technician

## 2013-03-25 ENCOUNTER — Encounter (HOSPITAL_COMMUNITY): Payer: Self-pay

## 2013-03-25 ENCOUNTER — Encounter (HOSPITAL_COMMUNITY)
Admission: RE | Admit: 2013-03-25 | Discharge: 2013-03-25 | Disposition: A | Discharge status: Home / Self Care | Payer: BC Managed Care – PPO | Source: Ambulatory Visit | Attending: Neurosurgery | Admitting: Neurosurgery

## 2013-03-25 HISTORY — DX: Inflammatory liver disease, unspecified: K75.9

## 2013-03-25 LAB — BASIC METABOLIC PANEL
BUN: 14 mg/dL (ref 6–23)
CO2: 25 mEq/L (ref 19–32)
Calcium: 9.9 mg/dL (ref 8.4–10.5)
Chloride: 101 mEq/L (ref 96–112)
Creatinine, Ser: 0.82 mg/dL (ref 0.50–1.35)
GFR calc Af Amer: >90 mL/min (ref >90)
GFR calc non Af Amer: >90 mL/min (ref >90)
Glucose, Bld: 117 mg/dL — ABNORMAL HIGH (ref 70–99)
Potassium: 4 mEq/L (ref 3.5–5.1)
Sodium: 135 mEq/L (ref 135–145)

## 2013-03-25 LAB — CBC
HCT: 35.9 % — ABNORMAL LOW (ref 39.0–52.0)
Hemoglobin: 12.4 g/dL — ABNORMAL LOW (ref 13.0–17.0)
MCH: 32.7 pg (ref 26.0–34.0)
MCHC: 34.5 g/dL (ref 30.0–36.0)
MCV: 94.7 fL (ref 78.0–100.0)
Platelets: 207 10*3/uL (ref 150–400)
RBC: 3.79 MIL/uL — ABNORMAL LOW (ref 4.22–5.81)
RDW: 12.7 % (ref 11.5–15.5)
WBC: 6.2 10*3/uL (ref 4.0–10.5)

## 2013-03-25 LAB — SURGICAL PCR SCREEN
MRSA, PCR: NEGATIVE
Staphylococcus aureus: NEGATIVE

## 2013-03-25 NOTE — Pre-Procedure Instructions (Addendum)
[name]  03/25/2013   Your procedure is scheduled on:  03/27/13  Report to [facility] at 1100  AM.  Call this number if you have problems the morning of surgery: [phone]   Remember:   Do not eat food or drink liquids after midnight.   Take these medicines the morning of surgery with A SIP OF WATER: inhaler if needed,          STOP aspirin,meloxicam, nsaids, herbal meds ,blood thinners now   Do not wear jewelry, make-up or nail polish.  Do not wear lotions, powders, or perfumes. You may wear deodorant.  Do not shave 48 hours prior to surgery. Men may shave face and neck.  Do not bring valuables to the hospital.  [facility] is not responsible                   for any belongings or valuables.  Contacts, dentures or bridgework may not be worn into surgery.  Leave suitcase in the car. After surgery it may be brought to your room.  For patients admitted to the hospital, checkout time is 11:00 AM the day of  discharge.   Patients discharged the day of surgery will not be allowed to drive  home.  Name and phone number of your driver:   Special Instructions: Shower using CHG 2 nights before surgery and the night before surgery.  If you shower the day of surgery use CHG.  Use special wash - you have one bottle of CHG for all showers.  You should use approximately 1/3 of the bottle for each shower.   Please read over the following fact sheets that you were given: Pain Booklet, Coughing and Deep Breathing and Surgical Site Infection Prevention

## 2013-03-25 NOTE — Progress Notes (Signed)
03/25/13 1132  OBSTRUCTIVE SLEEP APNEA  Have you ever been diagnosed with sleep apnea through a sleep study? No  Do you snore loudly (loud enough to be heard through closed doors)?  0  Do you often feel tired, fatigued, or sleepy during the daytime? 0  Has anyone observed you stop breathing during your sleep? 0  Do you have, or are you being treated for high blood pressure? 1  BMI more than 35 kg/m2? 1  Age over 62 years old? 1  Neck circumference greater than 40 cm/18 inches? 1 (19)  Gender: 1  Obstructive Sleep Apnea Score 5  Score 4 or greater  Results sent to PCP

## 2013-03-26 MED ORDER — CEFAZOLIN SODIUM-DEXTROSE 2-3 GM-% IV SOLR
2.0000 g | INTRAVENOUS | Status: AC
  Administered 2013-03-27: 2 g via INTRAVENOUS
  Filled 2013-03-26: qty 50

## 2013-03-27 ENCOUNTER — Inpatient Hospital Stay (HOSPITAL_COMMUNITY): Payer: BC Managed Care – PPO

## 2013-03-27 ENCOUNTER — Ambulatory Visit (HOSPITAL_COMMUNITY): Payer: BC Managed Care – PPO

## 2013-03-27 ENCOUNTER — Encounter (HOSPITAL_COMMUNITY): Payer: Self-pay | Admitting: Anesthesiology

## 2013-03-27 ENCOUNTER — Inpatient Hospital Stay (HOSPITAL_COMMUNITY)
Admission: RE | Admit: 2013-03-27 | Discharge: 2013-03-29 | DRG: 757 | Disposition: A | Discharge status: Home / Self Care | Payer: BC Managed Care – PPO | Source: Ambulatory Visit | Attending: Neurosurgery | Admitting: Neurosurgery

## 2013-03-27 ENCOUNTER — Encounter (HOSPITAL_COMMUNITY)
Admission: RE | Disposition: A | Discharge status: Home / Self Care | Payer: Self-pay | Source: Ambulatory Visit | Attending: Neurosurgery

## 2013-03-27 ENCOUNTER — Encounter (HOSPITAL_COMMUNITY): Payer: Self-pay | Admitting: *Deleted

## 2013-03-27 ENCOUNTER — Ambulatory Visit (HOSPITAL_COMMUNITY): Payer: BC Managed Care – PPO | Admitting: Anesthesiology

## 2013-03-27 DIAGNOSIS — Z79899 Other long term (current) drug therapy: Secondary | ICD-10-CM

## 2013-03-27 DIAGNOSIS — Z01812 Encounter for preprocedural laboratory examination: Secondary | ICD-10-CM

## 2013-03-27 DIAGNOSIS — J449 Chronic obstructive pulmonary disease, unspecified: Secondary | ICD-10-CM | POA: Diagnosis present

## 2013-03-27 DIAGNOSIS — M48062 Spinal stenosis, lumbar region with neurogenic claudication: Secondary | ICD-10-CM

## 2013-03-27 DIAGNOSIS — J309 Allergic rhinitis, unspecified: Secondary | ICD-10-CM | POA: Diagnosis present

## 2013-03-27 DIAGNOSIS — J4489 Other specified chronic obstructive pulmonary disease: Secondary | ICD-10-CM | POA: Diagnosis present

## 2013-03-27 DIAGNOSIS — Z7982 Long term (current) use of aspirin: Secondary | ICD-10-CM

## 2013-03-27 DIAGNOSIS — E785 Hyperlipidemia, unspecified: Secondary | ICD-10-CM | POA: Diagnosis present

## 2013-03-27 DIAGNOSIS — I10 Essential (primary) hypertension: Secondary | ICD-10-CM | POA: Diagnosis present

## 2013-03-27 DIAGNOSIS — M47817 Spondylosis without myelopathy or radiculopathy, lumbosacral region: Principal | ICD-10-CM | POA: Diagnosis present

## 2013-03-27 DIAGNOSIS — F17200 Nicotine dependence, unspecified, uncomplicated: Secondary | ICD-10-CM | POA: Diagnosis present

## 2013-03-27 HISTORY — PX: LUMBAR LAMINECTOMY/DECOMPRESSION MICRODISCECTOMY: SHX5026

## 2013-03-27 SURGERY — LUMBAR LAMINECTOMY/DECOMPRESSION MICRODISCECTOMY 2 LEVELS
Anesthesia: General | Site: Back | Laterality: Left | Wound class: Clean

## 2013-03-27 MED ORDER — NALOXONE HCL 0.4 MG/ML IJ SOLN: 0.4000 mg | INTRAMUSCULAR | Status: DC | PRN

## 2013-03-27 MED ORDER — GABAPENTIN 300 MG PO CAPS
300.0000 mg | ORAL_CAPSULE | Freq: Three times a day (TID) | ORAL | Status: DC
  Administered 2013-03-27 – 2013-03-29 (×5): 300 mg via ORAL
  Filled 2013-03-27 (×7): qty 1

## 2013-03-27 MED ORDER — VARENICLINE TARTRATE 0.5 MG PO TABS
0.5000 mg | ORAL_TABLET | Freq: Two times a day (BID) | ORAL | Status: DC
  Administered 2013-03-27 – 2013-03-29 (×2): 0.5 mg via ORAL
  Filled 2013-03-27 (×5): qty 1

## 2013-03-27 MED ORDER — LACTATED RINGERS IV SOLN
INTRAVENOUS | Status: DC
  Administered 2013-03-27: 12:00:00 via INTRAVENOUS

## 2013-03-27 MED ORDER — ALUM & MAG HYDROXIDE-SIMETH 200-200-20 MG/5ML PO SUSP: 30.0000 mL | Freq: Four times a day (QID) | ORAL | Status: DC | PRN

## 2013-03-27 MED ORDER — HYDROMORPHONE HCL PF 1 MG/ML IJ SOLN
INTRAMUSCULAR | Status: AC
  Filled 2013-03-27: qty 1

## 2013-03-27 MED ORDER — CEFAZOLIN SODIUM-DEXTROSE 2-3 GM-% IV SOLR
2.0000 g | Freq: Three times a day (TID) | INTRAVENOUS | Status: AC
  Administered 2013-03-27 – 2013-03-28 (×2): 2 g via INTRAVENOUS
  Filled 2013-03-27 (×3): qty 50

## 2013-03-27 MED ORDER — DIAZEPAM 5 MG PO TABS
ORAL_TABLET | ORAL | Status: AC
  Filled 2013-03-27: qty 1

## 2013-03-27 MED ORDER — MIDAZOLAM HCL 2 MG/2ML IJ SOLN
2.0000 mg | Freq: Once | INTRAMUSCULAR | Status: AC
  Administered 2013-03-27: 1 mg via INTRAVENOUS

## 2013-03-27 MED ORDER — HYDROCODONE-ACETAMINOPHEN 5-325 MG PO TABS
1.0000 | ORAL_TABLET | ORAL | Status: DC | PRN
  Administered 2013-03-28: 2 via ORAL
  Filled 2013-03-27: qty 2

## 2013-03-27 MED ORDER — ATORVASTATIN CALCIUM 10 MG PO TABS
10.0000 mg | ORAL_TABLET | Freq: Every day | ORAL | Status: DC
  Administered 2013-03-27 – 2013-03-29 (×3): 10 mg via ORAL
  Filled 2013-03-27 (×3): qty 1

## 2013-03-27 MED ORDER — THROMBIN 5000 UNITS EX SOLR
CUTANEOUS | Status: DC | PRN
  Administered 2013-03-27 (×2): 5000 [IU] via TOPICAL

## 2013-03-27 MED ORDER — LISINOPRIL 10 MG PO TABS
10.0000 mg | ORAL_TABLET | Freq: Every day | ORAL | Status: DC
Start: 2013-03-27 — End: 2013-03-29
  Administered 2013-03-27 – 2013-03-29 (×3): 10 mg via ORAL
  Filled 2013-03-27 (×3): qty 1

## 2013-03-27 MED ORDER — MIDAZOLAM HCL 5 MG/5ML IJ SOLN
INTRAMUSCULAR | Status: DC | PRN
  Administered 2013-03-27 (×2): 1.5 mg via INTRAVENOUS
  Administered 2013-03-27: 1 mg via INTRAVENOUS

## 2013-03-27 MED ORDER — OXYCODONE-ACETAMINOPHEN 5-325 MG PO TABS
1.0000 | ORAL_TABLET | ORAL | Status: DC | PRN
  Administered 2013-03-28 – 2013-03-29 (×6): 2 via ORAL
  Filled 2013-03-27 (×6): qty 2

## 2013-03-27 MED ORDER — DOCUSATE SODIUM 100 MG PO CAPS
100.0000 mg | ORAL_CAPSULE | Freq: Two times a day (BID) | ORAL | Status: DC
  Administered 2013-03-27 – 2013-03-29 (×4): 100 mg via ORAL
  Filled 2013-03-27 (×4): qty 1

## 2013-03-27 MED ORDER — SODIUM CHLORIDE 0.9 % IJ SOLN: 9.0000 mL | INTRAMUSCULAR | Status: DC | PRN

## 2013-03-27 MED ORDER — 0.9 % SODIUM CHLORIDE (POUR BTL) OPTIME
TOPICAL | Status: DC | PRN
  Administered 2013-03-27: 1000 mL

## 2013-03-27 MED ORDER — DIPHENHYDRAMINE HCL 12.5 MG/5ML PO ELIX: 12.5000 mg | ORAL_SOLUTION | Freq: Four times a day (QID) | ORAL | Status: DC | PRN

## 2013-03-27 MED ORDER — PROPOFOL 10 MG/ML IV BOLUS
INTRAVENOUS | Status: DC | PRN
  Administered 2013-03-27: 150 mg via INTRAVENOUS

## 2013-03-27 MED ORDER — MIDAZOLAM HCL 2 MG/2ML IJ SOLN
INTRAMUSCULAR | Status: AC
  Filled 2013-03-27: qty 2

## 2013-03-27 MED ORDER — HYDROMORPHONE HCL PF 1 MG/ML IJ SOLN
0.2500 mg | INTRAMUSCULAR | Status: DC | PRN
  Administered 2013-03-27 (×4): 0.5 mg via INTRAVENOUS

## 2013-03-27 MED ORDER — ONDANSETRON HCL 4 MG/2ML IJ SOLN
INTRAMUSCULAR | Status: DC | PRN
  Administered 2013-03-27: 4 mg via INTRAVENOUS

## 2013-03-27 MED ORDER — OXYCODONE HCL 5 MG PO TABS
5.0000 mg | ORAL_TABLET | Freq: Once | ORAL | Status: AC | PRN
  Administered 2013-03-27: 5 mg via ORAL

## 2013-03-27 MED ORDER — LACTATED RINGERS IV SOLN
INTRAVENOUS | Status: DC
  Administered 2013-03-28: 23:00:00 via INTRAVENOUS

## 2013-03-27 MED ORDER — MORPHINE SULFATE (PF) 1 MG/ML IV SOLN
INTRAVENOUS | Status: AC
  Filled 2013-03-27: qty 25

## 2013-03-27 MED ORDER — NEOSTIGMINE METHYLSULFATE 1 MG/ML IJ SOLN
INTRAMUSCULAR | Status: DC | PRN
  Administered 2013-03-27: 5 mg via INTRAVENOUS

## 2013-03-27 MED ORDER — VITAMIN D3 25 MCG (1000 UNIT) PO TABS
1000.0000 [IU] | ORAL_TABLET | Freq: Every day | ORAL | Status: DC
  Administered 2013-03-27 – 2013-03-29 (×3): 1000 [IU] via ORAL
  Filled 2013-03-27 (×3): qty 1

## 2013-03-27 MED ORDER — ROCURONIUM BROMIDE 100 MG/10ML IV SOLN
INTRAVENOUS | Status: DC | PRN
  Administered 2013-03-27: 10 mg via INTRAVENOUS
  Administered 2013-03-27: 50 mg via INTRAVENOUS
  Administered 2013-03-27 (×2): 10 mg via INTRAVENOUS

## 2013-03-27 MED ORDER — MENTHOL 3 MG MT LOZG: 1.0000 | LOZENGE | OROMUCOSAL | Status: DC | PRN

## 2013-03-27 MED ORDER — PHENOL 1.4 % MT LIQD: 1.0000 | OROMUCOSAL | Status: DC | PRN

## 2013-03-27 MED ORDER — BUPIVACAINE LIPOSOME 1.3 % IJ SUSP
INTRAMUSCULAR | Status: DC | PRN
  Administered 2013-03-27: 20 mL

## 2013-03-27 MED ORDER — HEMOSTATIC AGENTS (NO CHARGE) OPTIME
TOPICAL | Status: DC | PRN
  Administered 2013-03-27: 1 via TOPICAL

## 2013-03-27 MED ORDER — OXYCODONE HCL 5 MG/5ML PO SOLN: 5.0000 mg | Freq: Once | ORAL | Status: AC | PRN

## 2013-03-27 MED ORDER — BACITRACIN ZINC 500 UNIT/GM EX OINT
TOPICAL_OINTMENT | CUTANEOUS | Status: DC | PRN
  Administered 2013-03-27: 1 via TOPICAL

## 2013-03-27 MED ORDER — ACETAMINOPHEN 10 MG/ML IV SOLN
INTRAVENOUS | Status: AC
  Administered 2013-03-27: 1000 mg
  Filled 2013-03-27: qty 100

## 2013-03-27 MED ORDER — SODIUM CHLORIDE 0.9 % IR SOLN
Status: DC | PRN
  Administered 2013-03-27: 16:00:00

## 2013-03-27 MED ORDER — DIAZEPAM 5 MG PO TABS
5.0000 mg | ORAL_TABLET | Freq: Four times a day (QID) | ORAL | Status: DC | PRN
  Administered 2013-03-27 – 2013-03-28 (×2): 5 mg via ORAL
  Filled 2013-03-27: qty 1

## 2013-03-27 MED ORDER — GLYCOPYRROLATE 0.2 MG/ML IJ SOLN
INTRAMUSCULAR | Status: DC | PRN
  Administered 2013-03-27: .6 mg via INTRAVENOUS

## 2013-03-27 MED ORDER — BUPIVACAINE-EPINEPHRINE PF 0.5-1:200000 % IJ SOLN
INTRAMUSCULAR | Status: DC | PRN
  Administered 2013-03-27: 10 mL

## 2013-03-27 MED ORDER — ACETAMINOPHEN 325 MG PO TABS: 650.0000 mg | ORAL_TABLET | ORAL | Status: DC | PRN

## 2013-03-27 MED ORDER — ONDANSETRON HCL 4 MG/2ML IJ SOLN: 4.0000 mg | INTRAMUSCULAR | Status: DC | PRN

## 2013-03-27 MED ORDER — OXYCODONE HCL 5 MG PO TABS
ORAL_TABLET | ORAL | Status: AC
  Filled 2013-03-27: qty 1

## 2013-03-27 MED ORDER — ACETAMINOPHEN 650 MG RE SUPP: 650.0000 mg | RECTAL | Status: DC | PRN

## 2013-03-27 MED ORDER — MORPHINE SULFATE (PF) 1 MG/ML IV SOLN
INTRAVENOUS | Status: DC
  Administered 2013-03-27: 19:00:00 via INTRAVENOUS
  Administered 2013-03-28: 13 mg via INTRAVENOUS
  Administered 2013-03-28: 7.5 mg via INTRAVENOUS
  Administered 2013-03-28: 9 mg via INTRAVENOUS
  Administered 2013-03-28: 1.5 mg via INTRAVENOUS
  Administered 2013-03-28: 13.5 mg via INTRAVENOUS
  Filled 2013-03-27 (×2): qty 25

## 2013-03-27 MED ORDER — MORPHINE SULFATE 2 MG/ML IJ SOLN
1.0000 mg | INTRAMUSCULAR | Status: DC | PRN
Start: 2013-03-27 — End: 2013-03-29
  Administered 2013-03-28 – 2013-03-29 (×2): 2 mg via INTRAVENOUS
  Filled 2013-03-27 (×2): qty 1

## 2013-03-27 MED ORDER — LACTATED RINGERS IV SOLN
INTRAVENOUS | Status: DC | PRN
  Administered 2013-03-27 (×2): via INTRAVENOUS

## 2013-03-27 MED ORDER — SUFENTANIL CITRATE 50 MCG/ML IV SOLN
INTRAVENOUS | Status: DC | PRN
  Administered 2013-03-27 (×4): 5 ug via INTRAVENOUS
  Administered 2013-03-27 (×2): 10 ug via INTRAVENOUS
  Administered 2013-03-27: 5 ug via INTRAVENOUS
  Administered 2013-03-27: 20 ug via INTRAVENOUS

## 2013-03-27 MED ORDER — BUPIVACAINE LIPOSOME 1.3 % IJ SUSP
20.0000 mL | Freq: Once | INTRAMUSCULAR | Status: DC
  Filled 2013-03-27: qty 20

## 2013-03-27 MED ORDER — BUDESONIDE-FORMOTEROL FUMARATE 80-4.5 MCG/ACT IN AERO
2.0000 | INHALATION_SPRAY | Freq: Every day | RESPIRATORY_TRACT | Status: DC | PRN
  Administered 2013-03-28 (×2): 2 via RESPIRATORY_TRACT
  Filled 2013-03-27: qty 6.9

## 2013-03-27 MED ORDER — ONDANSETRON HCL 4 MG/2ML IJ SOLN: 4.0000 mg | Freq: Four times a day (QID) | INTRAMUSCULAR | Status: DC | PRN

## 2013-03-27 MED ORDER — LIDOCAINE HCL (CARDIAC) 20 MG/ML IV SOLN
INTRAVENOUS | Status: DC | PRN
  Administered 2013-03-27: 50 mg via INTRAVENOUS

## 2013-03-27 MED ORDER — ALBUMIN HUMAN 5 % IV SOLN
INTRAVENOUS | Status: DC | PRN
  Administered 2013-03-27: 17:00:00 via INTRAVENOUS

## 2013-03-27 MED ORDER — DIPHENHYDRAMINE HCL 50 MG/ML IJ SOLN: 12.5000 mg | Freq: Four times a day (QID) | INTRAMUSCULAR | Status: DC | PRN

## 2013-03-27 SURGICAL SUPPLY — 56 items
BAG DECANTER FOR FLEXI CONT (MISCELLANEOUS) ×2 IMPLANT
BENZOIN TINCTURE PRP APPL 2/3 (GAUZE/BANDAGES/DRESSINGS) ×2 IMPLANT
BIT DRILL NEURO 2X3.1 SFT TUCH (MISCELLANEOUS) ×1 IMPLANT
BLADE SURG ROTATE 9660 (MISCELLANEOUS) IMPLANT
BRUSH SCRUB EZ PLAIN DRY (MISCELLANEOUS) ×2 IMPLANT
BUR ACORN 6.0 (BURR) ×2 IMPLANT
BUR MATCHSTICK NEURO 3.0 LAGG (BURR) ×2 IMPLANT
CANISTER SUCTION 2500CC (MISCELLANEOUS) ×2 IMPLANT
CLOTH BEACON ORANGE TIMEOUT ST (SAFETY) ×2 IMPLANT
CONT SPEC 4OZ CLIKSEAL STRL BL (MISCELLANEOUS) ×2 IMPLANT
DRAPE LAPAROTOMY 100X72X124 (DRAPES) ×2 IMPLANT
DRAPE MICROSCOPE LEICA (MISCELLANEOUS) ×2 IMPLANT
DRAPE POUCH INSTRU U-SHP 10X18 (DRAPES) ×2 IMPLANT
DRAPE SURG 17X23 STRL (DRAPES) ×8 IMPLANT
DRILL NEURO 2X3.1 SOFT TOUCH (MISCELLANEOUS) ×2
ELECT BLADE 4.0 EZ CLEAN MEGAD (MISCELLANEOUS) ×2
ELECT REM PT RETURN 9FT ADLT (ELECTROSURGICAL) ×2
ELECTRODE BLDE 4.0 EZ CLN MEGD (MISCELLANEOUS) ×1 IMPLANT
ELECTRODE REM PT RTRN 9FT ADLT (ELECTROSURGICAL) ×1 IMPLANT
EVACUATOR 1/8 PVC DRAIN (DRAIN) ×2 IMPLANT
GAUZE SPONGE 4X4 16PLY XRAY LF (GAUZE/BANDAGES/DRESSINGS) ×2 IMPLANT
GLOVE BIO SURGEON STRL SZ 6.5 (GLOVE) ×10 IMPLANT
GLOVE BIO SURGEON STRL SZ8.5 (GLOVE) ×2 IMPLANT
GLOVE BIOGEL PI IND STRL 7.0 (GLOVE) ×1 IMPLANT
GLOVE BIOGEL PI INDICATOR 7.0 (GLOVE) ×1
GLOVE ECLIPSE 6.5 STRL STRAW (GLOVE) ×2 IMPLANT
GLOVE EXAM NITRILE LRG STRL (GLOVE) IMPLANT
GLOVE EXAM NITRILE MD LF STRL (GLOVE) IMPLANT
GLOVE EXAM NITRILE XL STR (GLOVE) IMPLANT
GLOVE EXAM NITRILE XS STR PU (GLOVE) IMPLANT
GLOVE SS BIOGEL STRL SZ 8 (GLOVE) ×1 IMPLANT
GLOVE SUPERSENSE BIOGEL SZ 8 (GLOVE) ×1
GOWN BRE IMP SLV AUR LG STRL (GOWN DISPOSABLE) ×2 IMPLANT
GOWN BRE IMP SLV AUR XL STRL (GOWN DISPOSABLE) ×4 IMPLANT
GOWN STRL REIN 2XL LVL4 (GOWN DISPOSABLE) IMPLANT
KIT BASIN OR (CUSTOM PROCEDURE TRAY) ×2 IMPLANT
KIT ROOM TURNOVER OR (KITS) ×2 IMPLANT
NEEDLE HYPO 21X1.5 SAFETY (NEEDLE) IMPLANT
NEEDLE HYPO 22GX1.5 SAFETY (NEEDLE) ×2 IMPLANT
NS IRRIG 1000ML POUR BTL (IV SOLUTION) ×2 IMPLANT
PACK LAMINECTOMY NEURO (CUSTOM PROCEDURE TRAY) ×2 IMPLANT
PAD ARMBOARD 7.5X6 YLW CONV (MISCELLANEOUS) ×6 IMPLANT
PATTIES SURGICAL .5 X1 (DISPOSABLE) IMPLANT
RUBBERBAND STERILE (MISCELLANEOUS) ×4 IMPLANT
SPONGE GAUZE 4X4 12PLY (GAUZE/BANDAGES/DRESSINGS) ×2 IMPLANT
SPONGE SURGIFOAM ABS GEL SZ50 (HEMOSTASIS) ×2 IMPLANT
STRIP CLOSURE SKIN 1/2X4 (GAUZE/BANDAGES/DRESSINGS) ×2 IMPLANT
SUT VIC AB 1 CT1 18XBRD ANBCTR (SUTURE) ×2 IMPLANT
SUT VIC AB 1 CT1 8-18 (SUTURE) ×2
SUT VIC AB 2-0 CP2 18 (SUTURE) ×4 IMPLANT
SYR 20CC LL (SYRINGE) IMPLANT
SYR 20ML ECCENTRIC (SYRINGE) ×2 IMPLANT
TAPE CLOTH SURG 4X10 WHT LF (GAUZE/BANDAGES/DRESSINGS) ×2 IMPLANT
TOWEL OR 17X24 6PK STRL BLUE (TOWEL DISPOSABLE) ×2 IMPLANT
TOWEL OR 17X26 10 PK STRL BLUE (TOWEL DISPOSABLE) ×2 IMPLANT
WATER STERILE IRR 1000ML POUR (IV SOLUTION) ×2 IMPLANT

## 2013-03-27 NOTE — Progress Notes (Signed)
Pt ambulated to bathroom with no difficulties. Pt states "It is like night and day between standing up and laying down." Pt statesa much improvement in standing. Inability to initiate stream. Bladder scan noted >950 cc. In and out removed 940. Will continue to monitor.

## 2013-03-27 NOTE — H&P (Signed)
Subjective: The patient is a 62 year old white male who has complained of back and radicular leg pain consistent with neurogenic claudication. He has failed medical management and was worked up with a lumbar MRI. This demonstrated spinal stenosis at L3-4 and L4-5. I discussed the various treatment options with the patient including surgery. He has weighed the risks, benefits, and alternatives surgery and decided proceed with an L3-4 and L4-5 laminectomy.   Past Medical History  Diagnosis Date  . Allergic rhinitis 07/1998  . COPD (chronic obstructive pulmonary disease) 07/1998    w ongoing tobacco use, spirometry (02/2013)  . Hyperlipidemia 06/1996  . HTN (hypertension) 05/1998  . ETOH abuse   . Smoker   . Sciatic nerve injury   . Hepatitis     pos test once not now    Past Surgical History  Procedure Laterality Date  . Back surgery  2007  . Neck fusion  2003  . Colonoscopy  09/2010    polyps, diverticula, hemorrhoids.  Rpt 5 yrs  . Ett  2010    normal, ABIs normal  . US echocardiography  01/2013    WNL, mild diastolic dysfunction  . Spriometry  02/2013    COPD with some restriction thought to be obesity related ([place])  . Shoulder arthroscopy Right 11  . Knee arthroscopy Right 11    cartlage, ligaments  . Knee arthroscopy Left 84    No Known Allergies  History  Substance Use Topics  . Smoking status: Current Every Day Smoker -- 0.50 packs/day for 40 years    Types: Cigarettes  . Smokeless tobacco: Never Used     Comment: 1 ppd X 30 years  . Alcohol Use: 12.6 oz/week    21 Cans of beer per week     Comment: 6 beers/day    Family History  Problem Relation Age of Onset  . Coronary artery disease Mother   . Hypertension Mother   . Stroke Mother   . Coronary artery disease Father   . Hypertension Father   . Diabetes Father   . Stroke Father   . Pneumonia Sister   . Irregular heart beat Brother   . Maple syrup urine disease Brother   . Cancer Neg Hx    Prior to  Admission medications   Medication Sig Start Date End Date Taking? Authorizing Provider  aspirin 81 MG tablet Take 81 mg by mouth daily.    Yes Historical Provider, MD  atorvastatin (LIPITOR) 10 MG tablet Take 1 tablet (10 mg total) by mouth daily. 11/30/12  Yes Eustaquio Boyden, MD  budesonide-formoterol [facility]) 80-4.5 MCG/ACT inhaler Inhale 2 puffs into the lungs daily as needed. 08/27/12  Yes Eustaquio Boyden, MD  cholecalciferol (VITAMIN D) 1000 UNITS tablet Take 1,000 Units by mouth daily.   Yes Historical Provider, MD  gabapentin (NEURONTIN) 300 MG capsule Take 300 mg by mouth 3 (three) times daily.  12/29/12  Yes Historical Provider, MD  lisinopril (PRINIVIL,ZESTRIL) 10 MG tablet Take 1 tablet (10 mg total) by mouth daily. 12/31/12  Yes Eustaquio Boyden, MD  meloxicam (MOBIC) 7.5 MG tablet Take 7.5 mg by mouth every 12 (twelve) hours.   Yes Historical Provider, MD  tadalafil (CIALIS) 20 MG tablet Take 20 mg by mouth daily as needed for erectile dysfunction.    Yes Historical Provider, MD  varenicline (CHANTIX) 0.5 MG tablet Take 1 tablet (0.5 mg total) by mouth 2 (two) times daily. 03/06/13  Yes Lupita Leash, MD  HYDROcodone-acetaminophen (NORCO) 10-325 MG per  tablet Take 1 tablet by mouth every 4 (four) hours as needed for pain.  11/22/12   Historical Provider, MD     Review of Systems  Positive ROS: As above  All other systems have been reviewed and were otherwise negative with the exception of those mentioned in the HPI and as above.  Objective: Vital signs in last 24 hours: Temp:  [97 F (36.1 C)] 97 F (36.1 C) (09/10 1118) Pulse Rate:  [87] 87 (09/10 1118) Resp:  [20] 20 (09/10 1118) BP: (165)/(89) 165/89 mmHg (09/10 1118) SpO2:  [97 %] 97 % (09/10 1118)  General Appearance: Alert, cooperative, no distress, appears stated age Head: Normocephalic, without obvious abnormality, atraumatic Eyes: PERRL, conjunctiva/corneas clear, EOM's intact, fundi benign, both eyes       Ears: Normal TM's and external ear canals, both ears Throat: Lips, mucosa, and tongue normal; teeth and gums normal Neck: Supple, symmetrical, trachea midline, no adenopathy; thyroid: No enlargement/tenderness/nodules; no carotid bruit or JVD. His cervical incision is well-healed. Back: Symmetric, no curvature, ROM normal, no CVA tenderness Lungs: Clear to auscultation bilaterally, respirations unlabored Heart: Regular rate and rhythm, S1 and S2 normal, no murmur, rub or gallop Abdomen: Soft, non-tender, bowel sounds active all four quadrants, no masses, no organomegaly Extremities: Extremities normal, atraumatic, no cyanosis or edema Pulses: 2+ and symmetric all extremities Skin: Skin color, texture, turgor normal, no rashes or lesions  NEUROLOGIC:   Mental status: alert and oriented, no aphasia, good attention span, Fund of knowledge/ memory ok Motor Exam - grossly normal Sensory Exam - grossly normal Reflexes:  Coordination - grossly normal Gait - grossly normal Balance - grossly normal Cranial Nerves: I: smell Not tested  II: visual acuity  OS: Normal    OD: Normal   II: visual fields Full to confrontation  II: pupils Equal, round, reactive to light  III,VII: ptosis None  III,IV,VI: extraocular muscles  Full ROM  V: mastication Normal  V: facial light touch sensation  Normal  V,VII: corneal reflex  Present  VII: facial muscle function - upper  Normal  VII: facial muscle function - lower Normal  VIII: hearing Not tested  IX: soft palate elevation  Normal  IX,X: gag reflex Present  XI: trapezius strength  5/5  XI: sternocleidomastoid strength 5/5  XI: neck flexion strength  5/5  XII: tongue strength  Normal    Data Review Lab Results  Component Value Date   WBC 6.2 03/25/2013   HGB 12.4* 03/25/2013   HCT 35.9* 03/25/2013   MCV 94.7 03/25/2013   PLT 207 03/25/2013   Lab Results  Component Value Date   NA 135 03/25/2013   K 4.0 03/25/2013   CL 101 03/25/2013   CO2 25 03/25/2013    BUN 14 03/25/2013   CREATININE 0.82 03/25/2013   GLUCOSE 117* 03/25/2013   No results found for this basename: INR, PROTIME    Assessment/Plan: L3-4 and L4-5 spinal stenosis, lumbago, lumbar radiculopathy, neurogenic claudication: I discussed the situation with the patient. I reviewed his MR scan with them and pointed out the abnormalities. We have discussed the various treatment options including surgery. I described the surgical treatment option of an L3-4 and L4-5 decompressive laminectomy. I described the surgery to him I've shown him surgical models. We have discussed the risks, benefits, alternatives, and likelihood of achieving our goals with surgery. I've answered all the patient's questions. He has decided to proceed with surgery.   Cristi Loron 03/27/2013 2:33 PM

## 2013-03-27 NOTE — Transfer of Care (Signed)
Immediate Anesthesia Transfer of Care Note  Patient: [name]  Procedure(s) Performed: Procedure(s) with comments: LUMBAR LAMINECTOMY/DECOMPRESSION MICRODISCECTOMY 2 LEVELS (Left) - LEFT Lumbar Three Four diskectomy with Lumbar Three-Four, Four-Five Laminectomy  Patient Location: PACU  Anesthesia Type:General  Level of Consciousness: awake, sedated and patient cooperative  Airway & Oxygen Therapy: Patient Spontanous Breathing and Patient connected to nasal cannula oxygen  Post-op Assessment: Report given to PACU RN, Post -op Vital signs reviewed and stable, Patient moving all extremities and Patient moving all extremities X 4  Post vital signs: Reviewed and stable  Complications: No apparent anesthesia complications

## 2013-03-27 NOTE — Op Note (Signed)
Brief history: The patient is a 62 year old white male who I previously performed a left L3-4 far lateral discectomy years ago. The patient has done well but has developed recurrent back and leg pain consistent with a lumbar radiculopathy. He has failed medical management and was worked up with a lumbar MRI. This demonstrated spinal stenosis at L3-4 and L4-5 and foraminal stenosis/recurrent herniated disc at L3-4 on the left. I discussed the various treatment options with the patient including surgery. He has weighed the risks, benefits, and alternatives surgery and decided proceed with a decompressive laminectomy and redo discectomy.  Preoperative diagnosis: L3-4 and L4-5 spinal stenosis, recurrent far lateral herniated disc/foraminal stenosis L3-4 on the left, lumbago, lumbar radiculopathy, neurogenic claudication  Postoperative diagnosis: The same  Procedure: L3 and L4 laminectomy to decompress the bilateral L3, L4 and L5 nerve rootsusing micro-dissection; redo left L3-4 far lateral discectomy using microdissection  Surgeon: Dr. Delma Officer  Asst.: Dr. Barbaraann Barthel  Anesthesia: Gen. endotracheal  Estimated blood loss: 100 cc  Drains: One medium Hemovac drain in the epidural space  Complications: None  Description of procedure: The patient was brought to the operating room by the anesthesia team. General endotracheal anesthesia was induced. The patient was turned to the prone position on the Wilson frame. The patient's lumbosacral region was then prepared with Betadine scrub and Betadine solution. Sterile drapes were applied.  I then injected the area to be incised with Marcaine with epinephrine solution. I then used a scalpel to make a linear midline incision over the L3-4 and L4-5 intervertebral disc space. I then used electrocautery to perform a bilateral  subperiosteal dissection exposing the spinous process and lamina of L3, L4 and L5. We obtained intraoperative radiograph to confirm our  location. I then inserted the [facility] retractor for exposure. I then incised the interspinous ligament at L2-3, L3-4 and L4-5 with a scalpel. I removed the spinous process at L3 and L4 with a Leksell rongeur.  We then brought the operative microscope into the field. Under its magnification and illumination we completed the microdissection. I used a high-speed drill to perform bilateral laminotomies at L3 and L4. I then used a Kerrison punches to complete the laminectomy at L3 and L4 and to removed the ligamentum flavum at L2-3, L3-4 and L4-5. We then used microdissection to free up the thecal sac and the bilateral L3, L4 and L5 nerve root from the epidural tissue. I then used a Kerrison punch to perform a foraminotomy at about the bilateral L3, L4 and L5 nerve root. We then using the nerve root retractor to gently retract the thecal sac and the nerve root medially. We inspected the intervertebral disc at L3-4 and L4-5 bilaterally there was no significant herniation. We did not perform A. intervertebral discectomy.  We now turned attention to the far lateral discectomy at L3-4 on the left. I used electrocautery to expose the remainder of the left L3 pars. I then used a high-speed drill to drill off more of the lateral aspect of the left L3 pars. I carefully dissected through the scar tissue from the prior operation and identified the exiting L3 nerve root. I did not encounter any significant recurrent ruptured disc. We found mainly spondylosis. I therefore perform a far lateral foraminotomy at about the left L3 nerve root I then palpated along the ventral surface of the thecal sac and along exit route of the bilateral L3, L4 and L5 as well as the far lateral exiting L3 nerve root nerve root  and noted that the neural structures were well decompressed. This completed the decompression.  We then obtained hemostasis using bipolar electrocautery. We irrigated the wound out with bacitracin solution. We then  removed the retractor. I then placed a medium Hemovac drain in the epidural space and tunneled it out through separate stab wound. We then reapproximated the patient's thoracolumbar fascia with interrupted #1 Vicryl suture. We then reapproximated the patient's subcutaneous tissue with interrupted 3-0 Vicryl suture. We then reapproximated patient's skin with Steri-Strips and benzoin. The was then coated with bacitracin ointment. The drapes were removed. The patient was subsequently returned to the supine position where they were extubated by the anesthesia team. The patient was then transported to the postanesthesia care unit in stable condition. All sponge instrument and needle counts were reportedly correct at the end of this case.

## 2013-03-27 NOTE — Anesthesia Procedure Notes (Signed)
Procedure Name: Intubation Date/Time: 03/27/2013 3:14 PM Performed by: Coralee Rud Pre-anesthesia Checklist: Patient identified, Emergency Drugs available, Suction available and Patient being monitored Patient Re-evaluated:Patient Re-evaluated prior to inductionOxygen Delivery Method: Circle system utilized Preoxygenation: Pre-oxygenation with 100% oxygen Intubation Type: IV induction Ventilation: Mask ventilation without difficulty Laryngoscope Size: Miller and 3 Grade View: Grade I Tube type: Oral Tube size: 8.0 mm Number of attempts: 1 Airway Equipment and Method: Stylet Placement Confirmation: ETT inserted through vocal cords under direct vision,  positive ETCO2 and breath sounds checked- equal and bilateral Secured at: 24 cm Tube secured with: Tape Dental Injury: Teeth and Oropharynx as per pre-operative assessment

## 2013-03-27 NOTE — Anesthesia Preprocedure Evaluation (Addendum)
Anesthesia Evaluation  Patient identified by MRN, date of birth, ID band Patient awake    Reviewed: Allergy & Precautions, H&P , NPO status , Patient's Chart, lab work & pertinent test results  Airway Mallampati: III TM Distance: >3 FB Neck ROM: Full    Dental no notable dental hx. (+) Teeth Intact and Dental Advisory Given   Pulmonary neg pulmonary ROS, COPD COPD inhaler, Current Smoker,  breath sounds clear to auscultation  Pulmonary exam normal       Cardiovascular hypertension, On Medications + Peripheral Vascular Disease Rhythm:Regular Rate:Normal     Neuro/Psych  Neuromuscular disease negative psych ROS   GI/Hepatic negative GI ROS, (+) Hepatitis -  Endo/Other  negative endocrine ROSMorbid obesity  Renal/GU negative Renal ROS  negative genitourinary   Musculoskeletal   Abdominal   Peds  Hematology negative hematology ROS (+)   Anesthesia Other Findings   Reproductive/Obstetrics negative OB ROS                          Anesthesia Physical Anesthesia Plan  ASA: III  Anesthesia Plan: General   Post-op Pain Management:    Induction: Intravenous  Airway Management Planned: Oral ETT  Additional Equipment:   Intra-op Plan:   Post-operative Plan: Extubation in OR  Informed Consent: I have reviewed the patients History and Physical, chart, labs and discussed the procedure including the risks, benefits and alternatives for the proposed anesthesia with the patient or authorized representative who has indicated his/her understanding and acceptance.   Dental advisory given  Plan Discussed with: CRNA  Anesthesia Plan Comments:         Anesthesia Quick Evaluation

## 2013-03-27 NOTE — Progress Notes (Signed)
Patient ID: [name], male   DOB: 23-Sep-1950, 62 y.o.   MRN: [id number] Subjective:  The patient is somnolent but easily arousable. His back is appropriately sore.  Objective: Vital signs in last 24 hours: Temp:  [97 F (36.1 C)] 97 F (36.1 C) (09/10 1118) Pulse Rate:  [87] 87 (09/10 1118) Resp:  [20] 20 (09/10 1118) BP: (165)/(89) 165/89 mmHg (09/10 1118) SpO2:  [97 %] 97 % (09/10 1118)  Intake/Output from previous day:   Intake/Output this shift: Total I/O In: 2600 [I.V.:2350; IV Piggyback:250] Out: 400 [Blood:400]  Physical exam the patient is Glasgow Coma Scale 12. He is moving all 4 extremities well.  Lab Results:  Recent Labs  03/25/13 1143  WBC 6.2  HGB 12.4*  HCT 35.9*  PLT 207   BMET  Recent Labs  03/25/13 1143  NA 135  K 4.0  CL 101  CO2 25  GLUCOSE 117*  BUN 14  CREATININE 0.82  CALCIUM 9.9    Studies/Results: Dg Lumbar Spine 1 View  03/27/2013   CLINICAL DATA:  62 year old male undergoing lumbar surgery.  EXAM: LUMBAR SPINE - 1 VIEW  COMPARISON:  [facility] lumbar MRI 03/11/2011.  FINDINGS: Same numbering system as on the comparison. Intraoperative portable cross-table lateral view of the lumbar spine at 1550 hrs.  Surgical probe directed at the L2-L3 disc space. Vacuum disc phenomena re- identified at L3-L4.  IMPRESSION: Intraoperative localization at L2-L3.   Electronically Signed   By: [name] M.D.   On: 03/27/2013 16:59    Assessment/Plan: The patient is doing well.  LOS: 0 days     [name] D 03/27/2013, 6:20 PM

## 2013-03-28 ENCOUNTER — Encounter: Payer: Self-pay | Admitting: Family Medicine

## 2013-03-28 NOTE — Progress Notes (Signed)
Patient ID: TODDRICK SANNA, male   DOB: May 13, 1951, 62 y.o.   MRN: [id number] Subjective:  The patient is alert and pleasant. His back is appropriately sore. He looks well. He has been using the PCA.  Objective: Vital signs in last 24 hours: Temp:  [97.7 F (36.5 C)-98.6 F (37 C)] 98 F (36.7 C) (09/11 1011) Pulse Rate:  [58-93] 71 (09/11 1011) Resp:  [8-20] 18 (09/11 1011) BP: (100-142)/(51-87) 100/55 mmHg (09/11 1011) SpO2:  [94 %-100 %] 100 % (09/11 1011) Weight:  [109.544 kg (241 lb 8 oz)] 109.544 kg (241 lb 8 oz) (09/10 2100)  Intake/Output from previous day: 09/10 0701 - 09/11 0700 In: 2600 [I.V.:2350; IV Piggyback:250] Out: 1500 [Urine:850; Drains:250; Blood:400] Intake/Output this shift:    Physical exam the patient is alert and oriented. He is moving his lower extremities well. His dressing is clean and dry.  Lab Results:  Recent Labs  03/25/13 1143  WBC 6.2  HGB 12.4*  HCT 35.9*  PLT 207   BMET  Recent Labs  03/25/13 1143  NA 135  K 4.0  CL 101  CO2 25  GLUCOSE 117*  BUN 14  CREATININE 0.82  CALCIUM 9.9    Studies/Results: Dg Lumbar Spine 1 View  03/27/2013   *RADIOLOGY REPORT*  Clinical Data: L3-4 discectomy  LUMBAR SPINE - 1 VIEW  Comparison: Study obtained earlier in the day.  Findings: Metallic probe tip is posterior to the L3-4 level.  There is no apparent fracture or spondylolisthesis.  IMPRESSION: Metallic probe tip is posterior to the L3-4 level on the current examination.  No fracture or spondylolisthesis.   Original Report Authenticated By: Bretta Bang, M.D.   Dg Lumbar Spine 1 View  03/27/2013   CLINICAL DATA:  62 year old male undergoing lumbar surgery.  EXAM: LUMBAR SPINE - 1 VIEW  COMPARISON:  [facility] lumbar MRI 03/11/2011.  FINDINGS: Same numbering system as on the comparison. Intraoperative portable cross-table lateral view of the lumbar spine at 1550 hrs.  Surgical probe directed at the L2-L3 disc  space. Vacuum disc phenomena re- identified at L3-L4.  IMPRESSION: Intraoperative localization at L2-L3.   Electronically Signed   By: [name] M.D.   On: 03/27/2013 16:59    Assessment/Plan: Postop day 1: We will mobilize the patient today. I will plan to discontinue his PCA and Hemovac drain tomorrow. He may go home tomorrow.  LOS: 1 day     Sheily Lineman D 03/28/2013, 11:23 AM

## 2013-03-28 NOTE — Evaluation (Signed)
Physical Therapy Evaluation Patient Details Name: [name] MRN: [id number] DOB: February 17, 1951 Today's Date: 03/28/2013 Time: 4098-1191 PT Time Calculation (min): 11 min  PT Assessment / Plan / Recommendation History of Present Illness  s/p L3 and L4 laminectomy to decompress the bilateral L3, L4 and L5 nerve rootsusing micro-dissection; redo left L3-4 far lateral discectomy using microdissection  Clinical Impression  Pt admitted with above. Pt currently with functional limitations due to the deficits listed below (see PT Problem List).  Pt will benefit from skilled PT to increase their independence and safety with mobility to allow discharge to the venue listed below.       PT Assessment  Patient needs continued PT services    Follow Up Recommendations  No PT follow up    Does the patient have the potential to tolerate intense rehabilitation      Barriers to Discharge        Equipment Recommendations  None recommended by PT    Recommendations for Other Services     Frequency Min 5X/week    Precautions / Restrictions Precautions Precautions: Back Precaution Comments: Reviewed back precautions.    Pertinent Vitals/Pain See flow sheet.      Mobility  Bed Mobility Bed Mobility: Rolling Right;Right Sidelying to Sit;Sitting - Scoot to Edge of Bed Rolling Right: 4: Min guard;With rail Right Sidelying to Sit: 4: Min guard;HOB flat Sitting - Scoot to Edge of Bed: 4: Min guard Details for Bed Mobility Assistance: VCs for log roll technique. Transfers Sit to Stand: 4: Min guard;From bed;With upper extremity assist Stand to Sit: 4: Min guard;To chair/3-in-1;With armrests;With upper extremity assist Details for Transfer Assistance: Guarding for safety Ambulation/Gait Ambulation/Gait Assistance: 4: Min guard Ambulation Distance (Feet): 250 Feet Assistive device: None Gait Pattern: Step-through pattern;Decreased stride length Gait velocity: decr    Exercises     PT  Diagnosis: Difficulty walking  PT Problem List: Decreased mobility PT Treatment Interventions: Gait training;Stair training;Functional mobility training;Therapeutic activities     PT Goals(Current goals can be found in the care plan section) Acute Rehab PT Goals Patient Stated Goal: to return home PT Goal Formulation: With patient Time For Goal Achievement: 04/01/13 Potential to Achieve Goals: Good  Visit Information  Last PT Received On: 03/28/13 Assistance Needed: +1 History of Present Illness: s/p L3 and L4 laminectomy to decompress the bilateral L3, L4 and L5 nerve rootsusing micro-dissection; redo left L3-4 far lateral discectomy using microdissection       Prior Functioning  Home Living Family/patient expects to be discharged to:: Private residence Living Arrangements: Spouse/significant other Available Help at Discharge: Family;Available 24 hours/day Type of Home: House Home Access: Stairs to enter [employer] of Steps: 8 steps in back; 2 in front Home Layout: One level Home Equipment: Cane - single point Prior Function Level of Independence: Independent Communication Communication: No difficulties Dominant Hand: Right    Cognition  Cognition Arousal/Alertness: Awake/alert Behavior During Therapy: WFL for tasks assessed/performed Overall Cognitive Status: Within Functional Limits for tasks assessed    Extremity/Trunk Assessment Upper Extremity Assessment Upper Extremity Assessment: Overall WFL for tasks assessed Lower Extremity Assessment Lower Extremity Assessment: Overall WFL for tasks assessed   Balance Balance Balance Assessed: Yes Static Standing Balance Static Standing - Balance Support: No upper extremity supported;During functional activity Static Standing - Level of Assistance: 5: Stand by assistance  End of Session PT - End of Session Activity Tolerance: Patient tolerated treatment well Patient left: in chair;with call bell/phone within  reach  GP  Siyona Coto 03/28/2013, 1:17 PM  [employer] PT [phone]

## 2013-03-28 NOTE — Progress Notes (Signed)
Am lisinopril held due to  BP 100/55. Will reassess BP later afternoon and give pending BP.

## 2013-03-28 NOTE — Progress Notes (Signed)
   CARE MANAGEMENT NOTE 03/28/2013  Patient:  [name], [name]   Account Number:  [id number]  Date Initiated:  03/28/2013  Documentation initiated by:  Jiles Crocker  Subjective/Objective Assessment:   ADMITTED FOR SURGERY - L3 and L4 laminectomy to decompress the bilateral L3, L4 and L5     Action/Plan:   LIVES AT HOME WITH SPOUSE; CM FOLLOWING FOR DCP   Anticipated DC Date:  04/01/2013   Anticipated DC Plan: POSSIBLY HOME/SELF CARE      DC Planning Services  CM consult              Status of service:  In process, will continue to follow Medicare Important Message given?  NA - LOS <3 / Initial given by admissions (If response is "NO", the following Medicare IM given date fields will be blank)  Per UR Regulation:  Reviewed for med. necessity/level of care/duration of stay  Comments:  03/28/2013- B Johnnell Liou RN,BSN,MHA

## 2013-03-28 NOTE — Evaluation (Signed)
Occupational Therapy Evaluation Patient Details Name: [name] MRN: [id number] DOB: 05-May-1951 Today's Date: 03/28/2013 Time: 8295-6213 OT Time Calculation (min): 31 min  OT Assessment / Plan / Recommendation History of present illness s/p L3 and L4 laminectomy to decompress the bilateral L3, L4 and L5 nerve rootsusing micro-dissection; redo left L3-4 far lateral discectomy using microdissection   Clinical Impression   Pt admitted with above. Will continue to follow acutely in order to address below problem list in prep for return home with wife.    OT Assessment  Patient needs continued OT Services    Follow Up Recommendations  No OT follow up;Supervision - Intermittent    Barriers to Discharge      Equipment Recommendations  None recommended by OT    Recommendations for Other Services    Frequency  Min 2X/week    Precautions / Restrictions Precautions Precautions: Back Precaution Comments: Reviewed back precautions.    Pertinent Vitals/Pain See vitals    ADL  Eating/Feeding: Performed;Independent Where Assessed - Eating/Feeding: Chair Grooming: Performed;Wash/dry face;Wash/dry hands;Teeth care;Supervision/safety Where Assessed - Grooming: Unsupported standing Upper Body Bathing: Simulated;Set up Where Assessed - Upper Body Bathing: Unsupported sitting Lower Body Bathing: Simulated;Minimal assistance Where Assessed - Lower Body Bathing: Unsupported sit to stand Upper Body Dressing: Simulated;Set up Where Assessed - Upper Body Dressing: Unsupported sitting Lower Body Dressing: Performed;Minimal assistance Where Assessed - Lower Body Dressing: Unsupported sit to stand Toilet Transfer: Simulated;Min guard Toilet Transfer Method: Sit to Barista:  (bed<>ambulating to bathroom then to chair) Equipment Used: Rolling walker;Gait belt Transfers/Ambulation Related to ADLs: Min guard with RW.  Pt not relying on RW with UEs but reported feeling  more confident with additional support due to feeling dizzy. ADL Comments: Pt c/o dizziness during session (possibly due to meds). Performed grooming tasks standing at sink then ambulated to recliner in room.     OT Diagnosis: Generalized weakness;Acute pain  OT Problem List: Decreased strength;Decreased activity tolerance;Impaired balance (sitting and/or standing);Decreased knowledge of use of DME or AE;Decreased knowledge of precautions;Pain OT Treatment Interventions: Self-care/ADL training;DME and/or AE instruction;Therapeutic activities;Patient/family education;Balance training   OT Goals(Current goals can be found in the care plan section) Acute Rehab OT Goals Patient Stated Goal: to return home OT Goal Formulation: With patient Time For Goal Achievement: 04/04/13 Potential to Achieve Goals: Good  Visit Information  Last OT Received On: 03/28/13 Assistance Needed: +1 History of Present Illness: s/p L3 and L4 laminectomy to decompress the bilateral L3, L4 and L5 nerve rootsusing micro-dissection; redo left L3-4 far lateral discectomy using microdissection       Prior Functioning     Home Living Family/patient expects to be discharged to:: Private residence Living Arrangements: Spouse/significant other Available Help at Discharge: Family;Available 24 hours/day Type of Home: House Home Access: Stairs to enter [employer] of Steps: 8 steps in back; 2 in front Home Layout: One level Home Equipment: Cane - single point Prior Function Level of Independence: Independent Communication Communication: No difficulties Dominant Hand: Right         Vision/Perception Vision - History Baseline Vision: Wears glasses all the time   Cognition  Cognition Arousal/Alertness: Awake/alert Behavior During Therapy: WFL for tasks assessed/performed Overall Cognitive Status: Within Functional Limits for tasks assessed    Extremity/Trunk Assessment Upper Extremity  Assessment Upper Extremity Assessment: Overall WFL for tasks assessed     Mobility Bed Mobility Bed Mobility: Rolling Right;Right Sidelying to Sit;Sitting - Scoot to Edge of Bed Rolling Right: 4: Min  guard;With rail Right Sidelying to Sit: 4: Min guard;HOB flat Sitting - Scoot to Edge of Bed: 4: Min guard Details for Bed Mobility Assistance: VCs for log roll technique. Transfers Transfers: Sit to Stand;Stand to Sit Sit to Stand: 4: Min guard;From bed;With upper extremity assist Stand to Sit: 4: Min guard;To chair/3-in-1;With armrests;With upper extremity assist Details for Transfer Assistance: Guarding for safety     Exercise     Balance     End of Session OT - End of Session Equipment Utilized During Treatment: Rolling walker;Gait belt Activity Tolerance: Patient tolerated treatment well Patient left: in chair;with call bell/phone within reach Nurse Communication: Mobility status  GO    03/28/2013 Cipriano Mile OTR/L Pager [phone] Office [phone]  Cipriano Mile 03/28/2013, 11:45 AM

## 2013-03-29 ENCOUNTER — Encounter: Payer: Self-pay | Admitting: Family Medicine

## 2013-03-29 MED ORDER — DSS 100 MG PO CAPS: 100.0000 mg | ORAL_CAPSULE | Freq: Two times a day (BID) | ORAL | Status: DC

## 2013-03-29 MED ORDER — OXYCODONE-ACETAMINOPHEN 10-325 MG PO TABS: 1.0000 | ORAL_TABLET | ORAL | Status: DC | PRN

## 2013-03-29 MED ORDER — DIAZEPAM 5 MG PO TABS: 5.0000 mg | ORAL_TABLET | Freq: Four times a day (QID) | ORAL | Status: DC | PRN

## 2013-03-29 NOTE — Plan of Care (Signed)
Problem: Consults Goal: Diagnosis - Spinal Surgery Outcome: Completed/Met Date Met:  03/29/13 Microdiscectomy     

## 2013-03-29 NOTE — Discharge Summary (Signed)
Physician Discharge Summary  Patient ID: [name] MRN: [id number] DOB/AGE: 1951-04-23 62 y.o.  Admit date: 03/27/2013 Discharge date: 03/29/2013  Admission Diagnoses: L3-4 and L4-5 spinal stenosis, lumbago, lumbar radiculopathy, neurogenic claudication  Discharge Diagnoses: The same Active Problems:   * No active hospital problems. *   Discharged Condition: good  Hospital Course: I performed at L3-4 and L4-5 decompressive laminectomy on the patient on 03/27/2013. The surgery went well. The patient's postoperative course was unremarkable except he had some temporary urinary retention. This resolved.  On postop day #2 the patient requested discharge to home. He was given oral and written discharge instructions. All the patient, and his wife's, questions were answered.  Consults: None Significant Diagnostic Studies: None Treatments: L3-4 and L4-5 decompressive laminectomy using microdissection Discharge Exam: Blood pressure 127/70, pulse 91, temperature 98.6 F (37 C), temperature source Oral, resp. rate 20, height 5\' 7"  (1.702 m), weight 109.544 kg (241 lb 8 oz), SpO2 91.00%. The patient is alert and pleasant. His dressing is clean and dry. His strength is normal.  Disposition: Home  Discharge Orders   Future Appointments Provider Department Dept Phone   05/14/2013 4:15 PM Iran Ouch, MD [facility] [place] [phone]   08/19/2013 7:30 AM [facility] [place] [phone]   08/26/2013 8:30 AM Eustaquio Boyden, MD [facility] [phone]   Future Orders Complete By Expires   Call MD for:  difficulty breathing, headache or visual disturbances  As directed    Call MD for:  extreme fatigue  As directed    Call MD for:  hives  As directed    Call MD for:  persistant dizziness or light-headedness  As directed    Call MD for:  persistant nausea and vomiting  As directed    Call MD for:  redness, tenderness, or  signs of infection (pain, swelling, redness, odor or green/yellow discharge around incision site)  As directed    Call MD for:  severe uncontrolled pain  As directed    Call MD for:  temperature >100.4  As directed    Diet - low sodium heart healthy  As directed    Discharge instructions  As directed    Comments:     Call [phone] for a followup appointment. Take a stool softener while you are using pain medications.   Driving Restrictions  As directed    Comments:     Do not drive for 2 weeks.   Increase activity slowly  As directed    Lifting restrictions  As directed    Comments:     Do not lift more than 5 pounds. No excessive bending or twisting.   May shower / Bathe  As directed    Comments:     He may shower after the pain she is removed 3 days after surgery. Leave the incision alone.   Remove dressing in 24 hours  As directed        Medication List    STOP taking these medications       HYDROcodone-acetaminophen 10-325 MG per tablet  Commonly known as:  NORCO      TAKE these medications       aspirin 81 MG tablet  Take 81 mg by mouth daily.     atorvastatin 10 MG tablet  Commonly known as:  LIPITOR  Take 1 tablet (10 mg total) by mouth daily.     budesonide-formoterol 80-4.5 MCG/ACT inhaler  Commonly known as:  SYMBICORT  Inhale 2 puffs into the lungs daily as needed.     cholecalciferol 1000 UNITS tablet  Commonly known as:  VITAMIN D  Take 1,000 Units by mouth daily.     CIALIS 20 MG tablet  Generic drug:  tadalafil  Take 20 mg by mouth daily as needed for erectile dysfunction.     diazepam 5 MG tablet  Commonly known as:  VALIUM  Take 1 tablet (5 mg total) by mouth every 6 (six) hours as needed.     DSS 100 MG Caps  Take 100 mg by mouth 2 (two) times daily.     gabapentin 300 MG capsule  Commonly known as:  NEURONTIN  Take 300 mg by mouth 3 (three) times daily.     lisinopril 10 MG tablet  Commonly known as:  PRINIVIL,ZESTRIL  Take 1  tablet (10 mg total) by mouth daily.     meloxicam 7.5 MG tablet  Commonly known as:  MOBIC  Take 7.5 mg by mouth every 12 (twelve) hours.     oxyCODONE-acetaminophen 10-325 MG per tablet  Commonly known as:  PERCOCET  Take 1 tablet by mouth every 4 (four) hours as needed for pain.     varenicline 0.5 MG tablet  Commonly known as:  CHANTIX  Take 1 tablet (0.5 mg total) by mouth 2 (two) times daily.         SignedTressie Stalker D 03/29/2013, 8:08 AM

## 2013-03-29 NOTE — Progress Notes (Signed)
Pt c/o of iv pump beeping due to low respiration rate of 6,said to be afraid to go to sleep, Dr Hirsch(on call) paged to see if morphine PCA can be d/c since pt also has order for iv morphine and oral percocet,called back and okay to d/c the PCA morphine, iv morphine 2mg  given at 2210 after the PCA was d/c at 2153,pt reassured, will however continue to monitor. Obasogie-Asidi, Ellard Nan Efe

## 2013-03-29 NOTE — Progress Notes (Addendum)
Occupational Therapy Treatment Patient Details Name: [name] MRN: [id number] DOB: Dec 06, 1950 Today's Date: 03/29/2013 Time: 5784-6962 OT Time Calculation (min): 12 min  OT Assessment / Plan / Recommendation  History of present illness s/p L3 and L4 laminectomy to decompress the bilateral L3, L4 and L5 nerve rootsusing micro-dissection; redo left L3-4 far lateral discectomy using microdissection   OT comments  Pt has made good progress and has met all goals. OT to sign off.  Follow Up Recommendations  No OT follow up;Supervision - Intermittent    Barriers to Discharge       Equipment Recommendations  None recommended by OT    Recommendations for Other Services    Frequency Min 2X/week   Progress towards OT Goals Progress towards OT goals: Goals met/education completed, patient discharged from OT  Plan Discharge plan remains appropriate;All goals met and education completed, patient discharged from OT services    Precautions / Restrictions Precautions Precautions: Back Precaution Comments: Reviewed back precautions.  Restrictions Weight Bearing Restrictions: No   Pertinent Vitals/Pain See vitals    ADL  Eating/Feeding: Performed;Independent Where Assessed - Eating/Feeding: Chair Grooming: Performed;Wash/dry hands;Independent Where Assessed - Grooming: Unsupported standing Lower Body Bathing: Simulated;Modified independent Where Assessed - Lower Body Bathing: Unsupported sit to stand Upper Body Dressing: Performed;Modified independent Where Assessed - Upper Body Dressing: Unsupported standing Lower Body Dressing: Performed;Modified independent Where Assessed - Lower Body Dressing: Unsupported sit to stand Toilet Transfer: Performed;Modified independent Toilet Transfer Method: Sit to Barista: Comfort height toilet Toileting - Clothing Manipulation and Hygiene: Performed;Modified independent Where Assessed - Toileting Clothing Manipulation  and Hygiene: Sit to stand from 3-in-1 or toilet Tub/Shower Transfer: Simulated;Modified independent Tub/Shower Transfer Method: Science writer: Walk in shower Equipment Used: Gait belt Transfers/Ambulation Related to ADLs: mod I without device ADL Comments: Reviewed ADL techniques to maintain precautions.Pt able to cross ankles over knees in order donn socks.      OT Diagnosis:    OT Problem List:   OT Treatment Interventions:     OT Goals(current goals can now be found in the care plan section) Acute Rehab OT Goals Patient Stated Goal: to return home OT Goal Formulation: With patient Time For Goal Achievement: 04/04/13 Potential to Achieve Goals: Good ADL Goals Pt Will Perform Lower Body Dressing: with modified independence;with adaptive equipment;sit to/from stand Pt Will Transfer to Toilet: with modified independence;ambulating Pt Will Perform Toileting - Clothing Manipulation and hygiene: with modified independence;sit to/from stand Pt Will Perform Tub/Shower Transfer: Shower transfer;with modified independence;ambulating Additional ADL Goal #1: Pt will perform bed mobility at mod I level as precursor for EOB ADLs.  Visit Information  Last OT Received On: 03/29/13 Assistance Needed: +1 History of Present Illness: s/p L3 and L4 laminectomy to decompress the bilateral L3, L4 and L5 nerve rootsusing micro-dissection; redo left L3-4 far lateral discectomy using microdissection    Subjective Data      Prior Functioning       Cognition  Cognition Arousal/Alertness: Awake/alert Behavior During Therapy: WFL for tasks assessed/performed Overall Cognitive Status: Within Functional Limits for tasks assessed    Mobility  Bed Mobility Bed Mobility: Not assessed Details for Bed Mobility Assistance: did not perofrm but reviewed log roll technique with pt. Transfers Transfers: Sit to Stand;Stand to Sit Sit to Stand: 6: Modified independent  (Device/Increase time);From toilet;From chair/3-in-1 Stand to Sit: 6: Modified independent (Device/Increase time);To chair/3-in-1;To toilet    Exercises      Balance     End  of Session OT - End of Session Activity Tolerance: Patient tolerated treatment well Patient left: in chair;with call bell/phone within reach Nurse Communication: Mobility status;Patient requests pain meds  GO    03/29/2013 Cipriano Mile OTR/L Pager [phone] Office [phone]  Cipriano Mile 03/29/2013, 10:37 AM

## 2013-03-29 NOTE — Progress Notes (Signed)
Discharge instructions given. Pt verbalized understanding and all questions were answered.  

## 2013-04-17 ENCOUNTER — Telehealth: Payer: Self-pay | Admitting: Pulmonary Disease

## 2013-04-17 MED ORDER — TIOTROPIUM BROMIDE MONOHYDRATE 18 MCG IN CAPS: 18.0000 ug | ORAL_CAPSULE | Freq: Every day | RESPIRATORY_TRACT | Status: DC

## 2013-04-17 NOTE — Telephone Encounter (Signed)
I spoke with pt. He needing rx for spiriva called in. He stated it helps a lot more than the symbicort did. RX has been sent. Nothing further needed

## 2013-04-30 NOTE — Anesthesia Postprocedure Evaluation (Signed)
  Anesthesia Post-op Note  Patient: [name]  Procedure(s) Performed: Procedure(s) with comments: LUMBAR LAMINECTOMY/DECOMPRESSION MICRODISCECTOMY 2 LEVELS (Left) - LEFT Lumbar Three Four diskectomy with Lumbar Three-Four, Four-Five Laminectomy  Patient Location: PACU  Anesthesia Type:General  Level of Consciousness: awake, alert  and oriented  Airway and Oxygen Therapy: Patient Spontanous Breathing and Patient connected to nasal cannula oxygen  Post-op Pain: mild  Post-op Assessment: Post-op Vital signs reviewed, Patient's Cardiovascular Status Stable and Patent Airway  Post-op Vital Signs: stable  Complications: No apparent anesthesia complications

## 2013-05-07 ENCOUNTER — Other Ambulatory Visit: Payer: Self-pay | Admitting: Family Medicine

## 2013-05-14 ENCOUNTER — Ambulatory Visit: Payer: BC Managed Care – PPO | Admitting: Cardiovascular Disease

## 2013-07-13 ENCOUNTER — Other Ambulatory Visit: Payer: Self-pay | Admitting: Family Medicine

## 2013-08-09 ENCOUNTER — Telehealth: Payer: Self-pay | Admitting: Pulmonary Disease

## 2013-08-09 NOTE — Telephone Encounter (Signed)
1st attempt to reach pt:  ATC, NA and voicemail was full, WCB - lmr 06/26/13. Satira Anis

## 2013-08-18 ENCOUNTER — Other Ambulatory Visit: Payer: Self-pay | Admitting: Family Medicine

## 2013-08-18 DIAGNOSIS — E785 Hyperlipidemia, unspecified: Secondary | ICD-10-CM

## 2013-08-18 DIAGNOSIS — I10 Essential (primary) hypertension: Secondary | ICD-10-CM

## 2013-08-18 DIAGNOSIS — E669 Obesity, unspecified: Secondary | ICD-10-CM

## 2013-08-18 DIAGNOSIS — D649 Anemia, unspecified: Secondary | ICD-10-CM

## 2013-08-18 DIAGNOSIS — Z125 Encounter for screening for malignant neoplasm of prostate: Secondary | ICD-10-CM

## 2013-08-19 ENCOUNTER — Other Ambulatory Visit (INDEPENDENT_AMBULATORY_CARE_PROVIDER_SITE_OTHER): Payer: BC Managed Care – PPO

## 2013-08-19 DIAGNOSIS — E785 Hyperlipidemia, unspecified: Secondary | ICD-10-CM

## 2013-08-19 DIAGNOSIS — Z125 Encounter for screening for malignant neoplasm of prostate: Secondary | ICD-10-CM

## 2013-08-19 DIAGNOSIS — D649 Anemia, unspecified: Secondary | ICD-10-CM

## 2013-08-19 DIAGNOSIS — I10 Essential (primary) hypertension: Secondary | ICD-10-CM

## 2013-08-19 LAB — COMPREHENSIVE METABOLIC PANEL
ALT: 32 U/L (ref 0–53)
AST: 17 U/L (ref 0–37)
Albumin: 3.9 g/dL (ref 3.5–5.2)
Alkaline Phosphatase: 80 U/L (ref 39–117)
BUN: 13 mg/dL (ref 6–23)
CO2: 28 mEq/L (ref 19–32)
Calcium: 9.5 mg/dL (ref 8.4–10.5)
Chloride: 101 mEq/L (ref 96–112)
Creatinine, Ser: 0.9 mg/dL (ref 0.4–1.5)
GFR: 94.46 mL/min (ref >60.00)
Glucose, Bld: 123 mg/dL — ABNORMAL HIGH (ref 70–99)
Potassium: 5 mEq/L (ref 3.5–5.1)
Sodium: 137 mEq/L (ref 135–145)
Total Bilirubin: 0.8 mg/dL (ref 0.3–1.2)
Total Protein: 6.6 g/dL (ref 6.0–8.3)

## 2013-08-19 LAB — CBC WITH DIFFERENTIAL/PLATELET
Basophils Absolute: 0 10*3/uL (ref 0.0–0.1)
Basophils Relative: 0.5 % (ref 0.0–3.0)
Eosinophils Absolute: 0.2 10*3/uL (ref 0.0–0.7)
Eosinophils Relative: 2.6 % (ref 0.0–5.0)
HCT: 38.5 % — ABNORMAL LOW (ref 39.0–52.0)
Hemoglobin: 12.8 g/dL — ABNORMAL LOW (ref 13.0–17.0)
Lymphocytes Relative: 43.7 % (ref 12.0–46.0)
Lymphs Abs: 2.8 10*3/uL (ref 0.7–4.0)
MCHC: 33.2 g/dL (ref 30.0–36.0)
MCV: 95.4 fl (ref 78.0–100.0)
Monocytes Absolute: 0.5 10*3/uL (ref 0.1–1.0)
Monocytes Relative: 8.3 % (ref 3.0–12.0)
Neutro Abs: 2.8 10*3/uL (ref 1.4–7.7)
Neutrophils Relative %: 44.9 % (ref 43.0–77.0)
Platelets: 319 10*3/uL (ref 150.0–400.0)
RBC: 4.04 Mil/uL — ABNORMAL LOW (ref 4.22–5.81)
RDW: 14.2 % (ref 11.5–14.6)
WBC: 6.3 10*3/uL (ref 4.5–10.5)

## 2013-08-19 LAB — LIPID PANEL
Cholesterol: 203 mg/dL — ABNORMAL HIGH (ref 0–200)
HDL: 69.2 mg/dL (ref >39.00)
Total CHOL/HDL Ratio: 3
Triglycerides: 52 mg/dL (ref 0.0–149.0)
VLDL: 10.4 mg/dL (ref 0.0–40.0)

## 2013-08-19 LAB — PSA: PSA: 1.84 ng/mL (ref 0.10–4.00)

## 2013-08-19 LAB — LDL CHOLESTEROL, DIRECT: Direct LDL: 129.4 mg/dL

## 2013-08-20 NOTE — Telephone Encounter (Signed)
LMOMTCB x 1 at home number.  Unable to leave message on mobile number.

## 2013-08-23 NOTE — Telephone Encounter (Signed)
Sent letter to schedule.  Nothing further needed at this time.  Satira Anis

## 2013-08-26 ENCOUNTER — Encounter: Payer: Self-pay | Admitting: Family Medicine

## 2013-08-26 ENCOUNTER — Ambulatory Visit (INDEPENDENT_AMBULATORY_CARE_PROVIDER_SITE_OTHER): Payer: BC Managed Care – PPO | Admitting: Family Medicine

## 2013-08-26 VITALS — BP 148/90 | HR 84 | Temp 98.8°F | Ht 66.0 in | Wt 205.5 lb

## 2013-08-26 DIAGNOSIS — R7309 Other abnormal glucose: Secondary | ICD-10-CM

## 2013-08-26 DIAGNOSIS — F1090 Alcohol use, unspecified, uncomplicated: Secondary | ICD-10-CM

## 2013-08-26 DIAGNOSIS — E669 Obesity, unspecified: Secondary | ICD-10-CM

## 2013-08-26 DIAGNOSIS — Z23 Encounter for immunization: Secondary | ICD-10-CM

## 2013-08-26 DIAGNOSIS — Z789 Other specified health status: Secondary | ICD-10-CM

## 2013-08-26 DIAGNOSIS — Z0000 Encounter for general adult medical examination without abnormal findings: Secondary | ICD-10-CM

## 2013-08-26 DIAGNOSIS — E785 Hyperlipidemia, unspecified: Secondary | ICD-10-CM

## 2013-08-26 DIAGNOSIS — J449 Chronic obstructive pulmonary disease, unspecified: Secondary | ICD-10-CM

## 2013-08-26 DIAGNOSIS — F17200 Nicotine dependence, unspecified, uncomplicated: Secondary | ICD-10-CM

## 2013-08-26 DIAGNOSIS — J4489 Other specified chronic obstructive pulmonary disease: Secondary | ICD-10-CM

## 2013-08-26 DIAGNOSIS — I10 Essential (primary) hypertension: Secondary | ICD-10-CM

## 2013-08-26 MED ORDER — TADALAFIL 20 MG PO TABS: 20.0000 mg | ORAL_TABLET | Freq: Every day | ORAL | Status: DC | PRN

## 2013-08-26 NOTE — Progress Notes (Signed)
Pre-visit discussion using our clinic review tool. No additional management support is needed unless otherwise documented below in the visit note.  

## 2013-08-26 NOTE — Assessment & Plan Note (Signed)
Chronic, stable. On lipitor - continue

## 2013-08-26 NOTE — Assessment & Plan Note (Signed)
anticipate prediabetes. Chronic, remains elevated.  Check A1c next blood draw Discussed dietary choices and importance of continued weight loss.

## 2013-08-26 NOTE — Patient Instructions (Addendum)
Let's keep an eye on your blood pressure at home and let me know if persistnetly >140/90 Pneumovax today (pneumonia shot). Return in 1+ month for shingles shot (zostavax). Congratulations on weight loss - continue working on this to help with sugar levels. Good to see you today, call us with questions.

## 2013-08-26 NOTE — Assessment & Plan Note (Signed)
Seen Dr. Lake Bells, working on cutting back on smoking.  Takes spiriva PRN.

## 2013-08-26 NOTE — Assessment & Plan Note (Signed)
Continue to encourage cessation. Contemplative. 

## 2013-08-26 NOTE — Assessment & Plan Note (Signed)
Chronic, stable. Continue med. 

## 2013-08-26 NOTE — Addendum Note (Signed)
Addended by: Royann Shivers A on: 08/26/2013 09:17 AM   Modules accepted: Orders

## 2013-08-26 NOTE — Assessment & Plan Note (Signed)
congratulated on weight loss, aware still has room for improvement

## 2013-08-26 NOTE — Progress Notes (Signed)
BP 148/90  Pulse 84  Temp(Src) 98.8 F (37.1 C) (Oral)  Ht 5\' 6"  (1.676 m)  Wt 205 lb 8 oz (93.214 kg)  BMI 33.18 kg/m2   CC: annual exam  Subjective:    Patient ID: [name], male    DOB: 1951/01/18, 63 y.o.   MRN: [id number]  HPI: [name] is a 63 y.o. male presenting on 08/26/2013 with Annual Exam  Screened positive for OSA during recent hospitalization for lumbar laminectomy [name]).  Back is not bothering him anymore.  COPD - has seen Dr. Fayne Mediate.  Working on quitting smoking.  Uses spiriva as needed.  Smoking <1/2 ppd.  Wt Readings from Last 3 Encounters:  08/26/13 205 lb 8 oz (93.214 kg)  03/27/13 241 lb 8 oz (109.544 kg)  03/27/13 241 lb 8 oz (109.544 kg)   Body mass index is 33.18 kg/(m^2). Has decreased alcohol consumption and noticed weight loss.  Overall feeling well.  Preventative:  Colonoscopy 09/23/2010 - polyps, diverticula, hemorrhoids. Rpt 5 yrs. Benson [place]).  Prostate - normal DRE/PSA in past.  Nocturia x3-4. Strong stream. Spacing out to Q2 years.  Last checked 2014. Flu shot - done at work. Tdap 08/2012 Pneumovax - today zostavax - in 1 month  Lives with wife. Has 1 son, 2 daughters.  Occupation: Psychologist, clinical.  Activity: no exercise  Diet: good water, fruits/vegetables daily  Relevant past medical, surgical, family and social history reviewed and updated. Allergies and medications reviewed and updated. Current Outpatient Prescriptions on File Prior to Visit  Medication Sig  . aspirin 81 MG tablet Take 81 mg by mouth daily.   [name] atorvastatin (LIPITOR) 10 MG tablet Take 1 tablet (10 mg total) by mouth daily.  . cholecalciferol (VITAMIN D) 1000 UNITS tablet Take 1,000 Units by mouth daily.  [name] lisinopril (PRINIVIL,ZESTRIL) 10 MG tablet TAKE 1 TABLET BY MOUTH ONCE A DAY  . varenicline (CHANTIX) 0.5 MG tablet Take 1 tablet (0.5 mg total) by mouth 2 (two) times daily.   No current facility-administered medications on file prior to visit.      Review of Systems  Constitutional: Negative for fever, chills, activity change, appetite change, fatigue and unexpected weight change (weight loss with healthier diet).  HENT: Negative for hearing loss.   Eyes: Negative for visual disturbance.  Respiratory: Positive for cough and shortness of breath. Negative for chest tightness and wheezing.   Cardiovascular: Negative for chest pain, palpitations and leg swelling.  Gastrointestinal: Negative for nausea, vomiting, abdominal pain, diarrhea, constipation, blood in stool and abdominal distention.  Genitourinary: Negative for hematuria and difficulty urinating.  Musculoskeletal: Negative for arthralgias, myalgias and neck pain.  Skin: Negative for rash.  Neurological: Negative for dizziness, seizures, syncope and headaches.  Hematological: Negative for adenopathy. Does not bruise/bleed easily.  Psychiatric/Behavioral: Negative for dysphoric mood. The patient is not nervous/anxious.    Per HPI unless specifically indicated above    Objective:    BP 148/90  Pulse 84  Temp(Src) 98.8 F (37.1 C) (Oral)  Ht 5\' 6"  (1.676 m)  Wt 205 lb 8 oz (93.214 kg)  BMI 33.18 kg/m2  Physical Exam  Nursing note and vitals reviewed. Constitutional: He is oriented to person, place, and time. He appears well-developed and well-nourished. No distress.  HENT:  Head: Normocephalic and atraumatic.  Right Ear: Hearing, tympanic membrane, external ear and ear canal normal.  Left Ear: Hearing, tympanic membrane, external ear and ear canal normal.  Nose: Nose normal.  Mouth/Throat: Uvula is  midline, oropharynx is clear and moist and mucous membranes are normal. No oropharyngeal exudate, posterior oropharyngeal edema or posterior oropharyngeal erythema.  Eyes: Conjunctivae and EOM are normal. Pupils are equal, round, and reactive to light. No scleral icterus.  Neck: Normal range of motion. Neck supple. Carotid bruit is not present. No thyromegaly present.   Cardiovascular: Normal rate, regular rhythm, normal heart sounds and intact distal pulses.   No murmur heard. Pulses:      Radial pulses are 2+ on the right side, and 2+ on the left side.  Pulmonary/Chest: Effort normal and breath sounds normal. No respiratory distress. He has no wheezes. He has no rales.  Abdominal: Soft. Bowel sounds are normal. He exhibits no distension and no mass. There is no tenderness. There is no rebound and no guarding.  Genitourinary:  deferred  Musculoskeletal: Normal range of motion. He exhibits no edema.  Lymphadenopathy:    He has no cervical adenopathy.  Neurological: He is alert and oriented to person, place, and time.  CN grossly intact, station and gait intact  Skin: Skin is warm and dry. No rash noted.  Psychiatric: He has a normal mood and affect. His behavior is normal. Judgment and thought content normal.   Results for orders placed in visit on 08/19/13  LIPID PANEL      Result Value Range   Cholesterol 203 (*) 0 - 200 mg/dL   Triglycerides 52.0  0.0 - 149.0 mg/dL   HDL 69.20  >39.00 mg/dL   VLDL 10.4  0.0 - 40.0 mg/dL   Total CHOL/HDL Ratio 3    PSA      Result Value Range   PSA 1.84  0.10 - 4.00 ng/mL  CBC WITH DIFFERENTIAL      Result Value Range   WBC 6.3  4.5 - 10.5 K/uL   RBC 4.04 (*) 4.22 - 5.81 Mil/uL   Hemoglobin 12.8 (*) 13.0 - 17.0 g/dL   HCT 38.5 (*) 39.0 - 52.0 %   MCV 95.4  78.0 - 100.0 fl   MCHC 33.2  30.0 - 36.0 g/dL   RDW 14.2  11.5 - 14.6 %   Platelets 319.0  150.0 - 400.0 K/uL   Neutrophils Relative % 44.9  43.0 - 77.0 %   Lymphocytes Relative 43.7  12.0 - 46.0 %   Monocytes Relative 8.3  3.0 - 12.0 %   Eosinophils Relative 2.6  0.0 - 5.0 %   Basophils Relative 0.5  0.0 - 3.0 %   Neutro Abs 2.8  1.4 - 7.7 K/uL   Lymphs Abs 2.8  0.7 - 4.0 K/uL   Monocytes Absolute 0.5  0.1 - 1.0 K/uL   Eosinophils Absolute 0.2  0.0 - 0.7 K/uL   Basophils Absolute 0.0  0.0 - 0.1 K/uL  COMPREHENSIVE METABOLIC PANEL      Result  Value Range   Sodium 137  135 - 145 mEq/L   Potassium 5.0  3.5 - 5.1 mEq/L   Chloride 101  96 - 112 mEq/L   CO2 28  19 - 32 mEq/L   Glucose, Bld 123 (*) 70 - 99 mg/dL   BUN 13  6 - 23 mg/dL   Creatinine, Ser 0.9  0.4 - 1.5 mg/dL   Total Bilirubin 0.8  0.3 - 1.2 mg/dL   Alkaline Phosphatase 80  39 - 117 U/L   AST 17  0 - 37 U/L   ALT 32  0 - 53 U/L   Total Protein 6.6  6.0 -  8.3 g/dL   Albumin 3.9  3.5 - 5.2 g/dL   Calcium 9.5  8.4 - 10.5 mg/dL   GFR 94.46  >60.00 mL/min  LDL CHOLESTEROL, DIRECT      Result Value Range   Direct LDL 129.4        Assessment & Plan:   Problem List Items Addressed This Visit   Health maintenance examination - Primary     Preventative protocols reviewed and updated unless pt declined. Discussed healthy diet and lifestyle. Pneumovax today.  zostavax next month. Prostate screening Q2 years - will do next year.    HYPERGLYCEMIA     anticipate prediabetes. Chronic, remains elevated.  Check A1c next blood draw Discussed dietary choices and importance of continued weight loss.    HYPERLIPIDEMIA     Chronic, stable. On lipitor - continue    Relevant Medications      tadalafil (CIALIS) tablet   HYPERTENSION     Chronic, stable. Continue med.    OBESITY     congratulated on weight loss, aware still has room for improvement    Smoker     Continue to encourage cessation. Contemplative.     Other Visit Diagnoses   Heavy alcohol use            Follow up plan: Return in about 1 year (around 08/26/2014), or as needed, for annual exam, prior fasting for blood work.

## 2013-08-26 NOTE — Assessment & Plan Note (Signed)
Preventative protocols reviewed and updated unless pt declined. Discussed healthy diet and lifestyle. Pneumovax today.  zostavax next month. Prostate screening Q2 years - will do next year.

## 2013-08-26 NOTE — Assessment & Plan Note (Signed)
Has cut back significantly and noted weight loss.

## 2013-08-27 ENCOUNTER — Telehealth: Payer: Self-pay | Admitting: Family Medicine

## 2013-08-27 NOTE — Telephone Encounter (Signed)
Relevant patient education assigned to patient using Emmi. ° °

## 2013-09-13 ENCOUNTER — Other Ambulatory Visit: Payer: Self-pay | Admitting: Family Medicine

## 2013-10-03 ENCOUNTER — Ambulatory Visit (INDEPENDENT_AMBULATORY_CARE_PROVIDER_SITE_OTHER): Payer: BC Managed Care – PPO

## 2013-10-03 DIAGNOSIS — Z23 Encounter for immunization: Secondary | ICD-10-CM

## 2013-10-03 DIAGNOSIS — Z2911 Encounter for prophylactic immunotherapy for respiratory syncytial virus (RSV): Secondary | ICD-10-CM

## 2013-12-07 ENCOUNTER — Inpatient Hospital Stay (HOSPITAL_COMMUNITY)
Admission: EM | Admit: 2013-12-07 | Discharge: 2013-12-09 | DRG: 312 | Disposition: A | Discharge status: Home / Self Care | Payer: BC Managed Care – PPO | Attending: Internal Medicine | Admitting: Internal Medicine

## 2013-12-07 ENCOUNTER — Emergency Department (HOSPITAL_COMMUNITY): Payer: BC Managed Care – PPO

## 2013-12-07 ENCOUNTER — Encounter (HOSPITAL_COMMUNITY): Payer: Self-pay | Admitting: Emergency Medicine

## 2013-12-07 DIAGNOSIS — Z8619 Personal history of other infectious and parasitic diseases: Secondary | ICD-10-CM

## 2013-12-07 DIAGNOSIS — E559 Vitamin D deficiency, unspecified: Secondary | ICD-10-CM

## 2013-12-07 DIAGNOSIS — R079 Chest pain, unspecified: Secondary | ICD-10-CM

## 2013-12-07 DIAGNOSIS — R0989 Other specified symptoms and signs involving the circulatory and respiratory systems: Secondary | ICD-10-CM

## 2013-12-07 DIAGNOSIS — E785 Hyperlipidemia, unspecified: Secondary | ICD-10-CM

## 2013-12-07 DIAGNOSIS — I10 Essential (primary) hypertension: Secondary | ICD-10-CM | POA: Diagnosis present

## 2013-12-07 DIAGNOSIS — B351 Tinea unguium: Secondary | ICD-10-CM

## 2013-12-07 DIAGNOSIS — Z0000 Encounter for general adult medical examination without abnormal findings: Secondary | ICD-10-CM

## 2013-12-07 DIAGNOSIS — N401 Enlarged prostate with lower urinary tract symptoms: Secondary | ICD-10-CM

## 2013-12-07 DIAGNOSIS — F1090 Alcohol use, unspecified, uncomplicated: Secondary | ICD-10-CM | POA: Diagnosis present

## 2013-12-07 DIAGNOSIS — R252 Cramp and spasm: Secondary | ICD-10-CM | POA: Diagnosis present

## 2013-12-07 DIAGNOSIS — Z7289 Other problems related to lifestyle: Secondary | ICD-10-CM | POA: Diagnosis present

## 2013-12-07 DIAGNOSIS — F10929 Alcohol use, unspecified with intoxication, unspecified: Secondary | ICD-10-CM

## 2013-12-07 DIAGNOSIS — E871 Hypo-osmolality and hyponatremia: Secondary | ICD-10-CM | POA: Diagnosis present

## 2013-12-07 DIAGNOSIS — R55 Syncope and collapse: Principal | ICD-10-CM | POA: Diagnosis present

## 2013-12-07 DIAGNOSIS — I739 Peripheral vascular disease, unspecified: Secondary | ICD-10-CM

## 2013-12-07 DIAGNOSIS — J449 Chronic obstructive pulmonary disease, unspecified: Secondary | ICD-10-CM | POA: Diagnosis present

## 2013-12-07 DIAGNOSIS — E669 Obesity, unspecified: Secondary | ICD-10-CM

## 2013-12-07 DIAGNOSIS — I4949 Other premature depolarization: Secondary | ICD-10-CM

## 2013-12-07 DIAGNOSIS — R0602 Shortness of breath: Secondary | ICD-10-CM

## 2013-12-07 DIAGNOSIS — R0609 Other forms of dyspnea: Secondary | ICD-10-CM

## 2013-12-07 DIAGNOSIS — F1010 Alcohol abuse, uncomplicated: Secondary | ICD-10-CM | POA: Diagnosis present

## 2013-12-07 DIAGNOSIS — N138 Other obstructive and reflux uropathy: Secondary | ICD-10-CM

## 2013-12-07 DIAGNOSIS — J4489 Other specified chronic obstructive pulmonary disease: Secondary | ICD-10-CM | POA: Diagnosis present

## 2013-12-07 DIAGNOSIS — R7309 Other abnormal glucose: Secondary | ICD-10-CM

## 2013-12-07 DIAGNOSIS — J441 Chronic obstructive pulmonary disease with (acute) exacerbation: Secondary | ICD-10-CM

## 2013-12-07 DIAGNOSIS — M48061 Spinal stenosis, lumbar region without neurogenic claudication: Secondary | ICD-10-CM | POA: Diagnosis present

## 2013-12-07 DIAGNOSIS — M5030 Other cervical disc degeneration, unspecified cervical region: Secondary | ICD-10-CM

## 2013-12-07 DIAGNOSIS — Z87898 Personal history of other specified conditions: Secondary | ICD-10-CM

## 2013-12-07 DIAGNOSIS — F17200 Nicotine dependence, unspecified, uncomplicated: Secondary | ICD-10-CM | POA: Diagnosis present

## 2013-12-07 DIAGNOSIS — Z79899 Other long term (current) drug therapy: Secondary | ICD-10-CM

## 2013-12-07 DIAGNOSIS — Z7982 Long term (current) use of aspirin: Secondary | ICD-10-CM

## 2013-12-07 DIAGNOSIS — J309 Allergic rhinitis, unspecified: Secondary | ICD-10-CM

## 2013-12-07 LAB — CBC WITH DIFFERENTIAL/PLATELET
Basophils Absolute: 0 10*3/uL (ref 0.0–0.1)
Basophils Relative: 0 % (ref 0–1)
Eosinophils Absolute: 0.1 10*3/uL (ref 0.0–0.7)
Eosinophils Relative: 1 % (ref 0–5)
HCT: 34.1 % — ABNORMAL LOW (ref 39.0–52.0)
Hemoglobin: 12.3 g/dL — ABNORMAL LOW (ref 13.0–17.0)
Lymphocytes Relative: 22 % (ref 12–46)
Lymphs Abs: 2 10*3/uL (ref 0.7–4.0)
MCH: 34 pg (ref 26.0–34.0)
MCHC: 36.1 g/dL — ABNORMAL HIGH (ref 30.0–36.0)
MCV: 94.2 fL (ref 78.0–100.0)
Monocytes Absolute: 0.8 10*3/uL (ref 0.1–1.0)
Monocytes Relative: 8 % (ref 3–12)
Neutro Abs: 6.4 10*3/uL (ref 1.7–7.7)
Neutrophils Relative %: 69 % (ref 43–77)
Platelets: 197 10*3/uL (ref 150–400)
RBC: 3.62 MIL/uL — ABNORMAL LOW (ref 4.22–5.81)
RDW: 12.6 % (ref 11.5–15.5)
WBC: 9.3 10*3/uL (ref 4.0–10.5)

## 2013-12-07 LAB — RAPID URINE DRUG SCREEN, HOSP PERFORMED
Amphetamines: NOT DETECTED
Barbiturates: NOT DETECTED
Benzodiazepines: NOT DETECTED
Cocaine: NOT DETECTED
Opiates: NOT DETECTED
Tetrahydrocannabinol: NOT DETECTED

## 2013-12-07 LAB — COMPREHENSIVE METABOLIC PANEL
ALT: 25 U/L (ref 0–53)
AST: 20 U/L (ref 0–37)
Albumin: 3.7 g/dL (ref 3.5–5.2)
Alkaline Phosphatase: 72 U/L (ref 39–117)
BUN: 16 mg/dL (ref 6–23)
CO2: 19 mEq/L (ref 19–32)
Calcium: 8 mg/dL — ABNORMAL LOW (ref 8.4–10.5)
Chloride: 90 mEq/L — ABNORMAL LOW (ref 96–112)
Creatinine, Ser: 1.36 mg/dL — ABNORMAL HIGH (ref 0.50–1.35)
GFR calc Af Amer: 63 mL/min — ABNORMAL LOW (ref >90)
GFR calc non Af Amer: 54 mL/min — ABNORMAL LOW (ref >90)
Glucose, Bld: 126 mg/dL — ABNORMAL HIGH (ref 70–99)
Potassium: 3.7 mEq/L (ref 3.7–5.3)
Sodium: 127 mEq/L — ABNORMAL LOW (ref 137–147)
Total Bilirubin: 0.3 mg/dL (ref 0.3–1.2)
Total Protein: 6.3 g/dL (ref 6.0–8.3)

## 2013-12-07 LAB — ETHANOL: Alcohol, Ethyl (B): 97 mg/dL — ABNORMAL HIGH (ref 0–11)

## 2013-12-07 LAB — TROPONIN I: Troponin I: <0.3 ng/mL (ref <0.30)

## 2013-12-07 LAB — CBG MONITORING, ED: Glucose-Capillary: 117 mg/dL — ABNORMAL HIGH (ref 70–99)

## 2013-12-07 MED ORDER — SODIUM CHLORIDE 0.9 % IV BOLUS (SEPSIS)
1000.0000 mL | Freq: Once | INTRAVENOUS | Status: AC
  Administered 2013-12-08: 1000 mL via INTRAVENOUS

## 2013-12-07 NOTE — ED Provider Notes (Signed)
MSE was initiated and I personally evaluated the patient and placed orders (if any) at  10:41 PM on Dec 07, 2013.  The patient appears stable so that the remainder of the MSE may be completed by another provider.  Patient comes in after first lifetime syncopal episode where he was standing, became lightheaded, and fell to floor lost consciousness for unknown period of time. He states that he has had leg cramping for one week and drinks 6-8 beers a day regularly and also do that today. He has been in the sun during the day as he is having a new deck installed and also took a Cialis in anticipation of his evening. Family history of cardiac disease. No preceding palpitations. Hemodynamically stable, screening labs and chest x-ray initiated.  Leo Grosser, MD 12/07/13 [phone]

## 2013-12-07 NOTE — ED Notes (Addendum)
Pt presents from home via [facility] with c/o syncopal episode today at 2100. Pt has been feeling lightheaded and dizzy with intermittent leg cramping  x1 week. Pt reports he also drinks approx 8 beers a day and did take 1 Cialis today. Pt got up to get something to drink at approx 2100 today and had syncopal episode; pt states awoke after a couple of seconds and was very diaphoretic. Pt c/o nausea en route, was given 4mg  IV Zofran with some relief. PT in NSR per EMS. Pt reports intermittent SOB (Hx COPD). Pt alert and oriented x4, in NAD.

## 2013-12-07 NOTE — ED Provider Notes (Signed)
CSN: [id number]     Arrival date & time 12/07/13  2213 History   First MD Initiated Contact with Patient 12/07/13 2303     Chief Complaint  Patient presents with  . Loss of Consciousness     (Consider location/radiation/quality/duration/timing/severity/associated sxs/prior Treatment) HPI  63 yo male BIB EMS from home after a syncopal event which occurred at 2100. Patient notes that she has had a few episodes of near syncope with lightheadedness upon standing from sitting, over the past 7 to 10 days. Patient notes that he drank at least 8 beers today and took a Cialis. He went to sleep and awoke feeling nauseated and diaphoretic. He believes that he blacked out. But, did not fall/collapse. Denies experiencing chest pain.   PMH notable for COPD, ETOH abuse, tobacco abuse, HLD, hepatiis.    Past Medical History  Diagnosis Date  . Allergic rhinitis 07/1998  . COPD (chronic obstructive pulmonary disease) 07/1998    w ongoing tobacco use, spirometry (02/2013)  . Hyperlipidemia 06/1996  . HTN (hypertension) 05/1998  . ETOH abuse   . Smoker   . Sciatic nerve injury   . Hepatitis     pos test once not now  . Lumbar spinal stenosis 03/2013    s/p laminectomy   Past Surgical History  Procedure Laterality Date  . Back surgery  2007  . Neck fusion  2003  . Colonoscopy  09/2010    polyps, diverticula, hemorrhoids.  Rpt 5 yrs  . Ett  2010    normal, ABIs normal  . US echocardiography  01/2013    WNL, mild diastolic dysfunction  . Spriometry  02/2013    COPD with some restriction thought to be obesity related ([facility])  . Shoulder arthroscopy Right 11  . Knee arthroscopy Right 11    cartlage, ligaments  . Knee arthroscopy Left 84  . Lumbar laminectomy/decompression microdiscectomy Left 03/27/2013    Procedure: LUMBAR LAMINECTOMY/DECOMPRESSION MICRODISCECTOMY 2 LEVELS;  Surgeon: Ophelia Charter, MD;  Location: [facility];  Service: Neurosurgery;  Laterality: Left;  LEFT Lumbar Three  Four diskectomy with Lumbar Three-Four, Four-Five Laminectomy. L3/4 ahd L4/5 for LSS [name])    Family History  Problem Relation Age of Onset  . Coronary artery disease Mother   . Hypertension Mother   . Stroke Mother   . Coronary artery disease Father   . Hypertension Father   . Diabetes Father   . Stroke Father   . Pneumonia Sister   . Irregular heart beat Brother   . Maple syrup urine disease Brother   . Cancer Neg Hx    History  Substance Use Topics  . Smoking status: Current Every Day Smoker -- 0.50 packs/day for 40 years    Types: Cigarettes  . Smokeless tobacco: Never Used     Comment: 1 ppd X 30 years  . Alcohol Use: 33.6 oz/week    56 Cans of beer per week     Comment: 8 beers/day    Review of Systems 10 point ROS performed and is negative with the exception of symptoms noted above.     Allergies  Review of patient's allergies indicates no known allergies.  Home Medications   Prior to Admission medications   Medication Sig Start Date End Date Taking? Authorizing Provider  aspirin EC 81 MG tablet Take 81 mg by mouth daily.   Yes Historical Provider, MD  atorvastatin (LIPITOR) 10 MG tablet Take 1 tablet (10 mg total) by mouth daily. 11/30/12  Yes Garlon Hatchet  Danise Mina, MD  budesonide-formoterol [facility]) 80-4.5 MCG/ACT inhaler Inhale 2 puffs into the lungs daily as needed (shortness of breath).   Yes Historical Provider, MD  lisinopril (PRINIVIL,ZESTRIL) 10 MG tablet Take 10 mg by mouth daily.   Yes Historical Provider, MD  OVER THE COUNTER MEDICATION Take 1 capsule by mouth daily. [employer] BLM (Bones, Ligaments and muscles)   Yes Historical Provider, MD  OVER THE COUNTER MEDICATION Take 1 capsule by mouth daily. [employer] B   Yes Historical Provider, MD  OVER THE COUNTER MEDICATION Take 1 capsule by mouth daily. [employer] Sulfurzyme (for healthy immune system and to boost liver function   Yes Historical Provider, MD  Riboflavin (VITAMIN B-2 PO) Take  1 tablet by mouth daily.   Yes Historical Provider, MD  tadalafil (CIALIS) 20 MG tablet Take 1 tablet (20 mg total) by mouth daily as needed for erectile dysfunction. 08/26/13  Yes Ria Bush, MD   BP 136/58  Pulse 78  Temp(Src) 98.3 F (36.8 C) (Oral)  Resp 17  SpO2 97% Physical Exam  Constitutional: He is oriented to person, place, and time. He appears well-developed and well-nourished.  HENT:  Head: Normocephalic and atraumatic.  Nose: Nose normal.  Mouth/Throat: Oropharynx is clear and moist.  Eyes: Conjunctivae and EOM are normal. Pupils are equal, round, and reactive to light.  Neck: Normal range of motion. Neck supple. No JVD present.  Cardiovascular: Normal rate, regular rhythm and normal heart sounds.   No murmur heard. Pulmonary/Chest: Effort normal and breath sounds normal. No stridor. No respiratory distress. He has no wheezes. He has no rales. He exhibits no tenderness.  Abdominal: Soft. He exhibits no distension. There is no tenderness.  Musculoskeletal: Normal range of motion. He exhibits no edema and no tenderness.  Neurological: He is alert and oriented to person, place, and time. No cranial nerve deficit.  Skin: Skin is warm and dry.  Psychiatric: He has a normal mood and affect.       ED Course  Procedures (including critical care time) Labs Review Labs Reviewed  CBC WITH DIFFERENTIAL - Abnormal; Notable for the following:    RBC 3.62 (*)    Hemoglobin 12.3 (*)    HCT 34.1 (*)    MCHC 36.1 (*)    All other components within normal limits  CBG MONITORING, ED - Abnormal; Notable for the following:    Glucose-Capillary 117 (*)    All other components within normal limits  COMPREHENSIVE METABOLIC PANEL  TROPONIN I  ETHANOL  URINE RAPID DRUG SCREEN (HOSP PERFORMED)    Imaging Review CT Head Wo Contrast (Final result)  Result time: 12/08/13 01:24:09    Final result by Rad Results In Interface (12/08/13 01:24:09)    Narrative:   CLINICAL DATA:  Syncope. Lightheaded and dizziness. Intermittent leg cramping. Nausea and shortness of breath.  EXAM: CT HEAD WITHOUT CONTRAST  TECHNIQUE: Contiguous axial images were obtained from the base of the skull through the vertex without intravenous contrast.  COMPARISON: None.  FINDINGS: There is no evidence of acute infarction, mass lesion, or intra- or extra-axial hemorrhage on CT.  The posterior fossa, including the cerebellum, brainstem and fourth ventricle, is within normal limits. The third and lateral ventricles, and basal ganglia are unremarkable in appearance. The cerebral hemispheres are symmetric in appearance, with normal gray-white differentiation. No mass effect or midline shift is seen.  There is no evidence of fracture; visualized osseous structures are unremarkable in appearance. The orbits are within normal  limits. The paranasal sinuses and mastoid air cells are well-aerated. No significant soft tissue abnormalities are seen.  IMPRESSION: Unremarkable noncontrast CT of the head.   Electronically Signed By: Garald Balding M.D. On: 12/08/2013 01:24             DG Chest 2 View (Final result)  Result time: 12/07/13 23:51:37    Final result by Rad Results In Interface (12/07/13 23:51:37)    Narrative:   CLINICAL DATA: Loss of consciousness.  EXAM: CHEST 2 VIEW  COMPARISON: 03/08/2013  FINDINGS: The heart size and pulmonary vascularity are normal. There is chronic peribronchial thickening with flattening of the diaphragm consistent with COPD. No acute osseous abnormalities. No infiltrates or effusions.  IMPRESSION: No acute abnormalities. Chronic bronchitic changes with COPD.   Electronically Signed By: Rozetta Nunnery M.D. On: 12/07/2013 23:51   EKG: NSR, normal axis, normal intervals, no acute ischemic changes.    MDM   DDX: cardiogenic syncope, medication and alcohol potentiated syncope, volume depletion, electrolyte disturbance, AKI.   ED  work up notable for mild AKI.  We are treating with volume resuscitation. CT head wnl. EKG and first troponin wnl. Case discussed with hospitalist who will admit for further IV resuscitation, telemetry observation and consideration for further evaluation of cause of syncope.     Elyn Peers, MD 12/17/13 [phone]

## 2013-12-08 ENCOUNTER — Observation Stay (HOSPITAL_COMMUNITY): Payer: BC Managed Care – PPO

## 2013-12-08 ENCOUNTER — Emergency Department (HOSPITAL_COMMUNITY): Payer: BC Managed Care – PPO

## 2013-12-08 ENCOUNTER — Encounter (HOSPITAL_COMMUNITY): Payer: Self-pay | Admitting: Emergency Medicine

## 2013-12-08 DIAGNOSIS — F1010 Alcohol abuse, uncomplicated: Secondary | ICD-10-CM

## 2013-12-08 DIAGNOSIS — J449 Chronic obstructive pulmonary disease, unspecified: Secondary | ICD-10-CM

## 2013-12-08 DIAGNOSIS — Z87898 Personal history of other specified conditions: Secondary | ICD-10-CM

## 2013-12-08 DIAGNOSIS — R55 Syncope and collapse: Principal | ICD-10-CM

## 2013-12-08 DIAGNOSIS — F17200 Nicotine dependence, unspecified, uncomplicated: Secondary | ICD-10-CM

## 2013-12-08 DIAGNOSIS — M79609 Pain in unspecified limb: Secondary | ICD-10-CM

## 2013-12-08 DIAGNOSIS — R252 Cramp and spasm: Secondary | ICD-10-CM | POA: Diagnosis present

## 2013-12-08 DIAGNOSIS — E871 Hypo-osmolality and hyponatremia: Secondary | ICD-10-CM

## 2013-12-08 DIAGNOSIS — F1090 Alcohol use, unspecified, uncomplicated: Secondary | ICD-10-CM | POA: Diagnosis present

## 2013-12-08 DIAGNOSIS — I10 Essential (primary) hypertension: Secondary | ICD-10-CM

## 2013-12-08 DIAGNOSIS — Z7289 Other problems related to lifestyle: Secondary | ICD-10-CM | POA: Diagnosis present

## 2013-12-08 LAB — COMPREHENSIVE METABOLIC PANEL
ALT: 29 U/L (ref 0–53)
AST: 23 U/L (ref 0–37)
Albumin: 3.9 g/dL (ref 3.5–5.2)
Alkaline Phosphatase: 72 U/L (ref 39–117)
BUN: 12 mg/dL (ref 6–23)
CO2: 22 mEq/L (ref 19–32)
Calcium: 8.6 mg/dL (ref 8.4–10.5)
Chloride: 100 mEq/L (ref 96–112)
Creatinine, Ser: 1.03 mg/dL (ref 0.50–1.35)
GFR calc Af Amer: 88 mL/min — ABNORMAL LOW (ref >90)
GFR calc non Af Amer: 76 mL/min — ABNORMAL LOW (ref >90)
Glucose, Bld: 107 mg/dL — ABNORMAL HIGH (ref 70–99)
Potassium: 4.5 mEq/L (ref 3.7–5.3)
Sodium: 134 mEq/L — ABNORMAL LOW (ref 137–147)
Total Bilirubin: 0.4 mg/dL (ref 0.3–1.2)
Total Protein: 6.5 g/dL (ref 6.0–8.3)

## 2013-12-08 LAB — PROTIME-INR
INR: 0.9 (ref 0.00–1.49)
Prothrombin Time: 12 seconds (ref 11.6–15.2)

## 2013-12-08 LAB — CBC
HCT: 33.9 % — ABNORMAL LOW (ref 39.0–52.0)
Hemoglobin: 12.1 g/dL — ABNORMAL LOW (ref 13.0–17.0)
MCH: 33.4 pg (ref 26.0–34.0)
MCHC: 35.7 g/dL (ref 30.0–36.0)
MCV: 93.6 fL (ref 78.0–100.0)
Platelets: 184 10*3/uL (ref 150–400)
RBC: 3.62 MIL/uL — ABNORMAL LOW (ref 4.22–5.81)
RDW: 12.8 % (ref 11.5–15.5)
WBC: 6.5 10*3/uL (ref 4.0–10.5)

## 2013-12-08 LAB — FERRITIN: Ferritin: 74 ng/mL (ref 22–322)

## 2013-12-08 LAB — TROPONIN I
Troponin I: <0.3 ng/mL (ref <0.30)
Troponin I: <0.3 ng/mL (ref <0.30)
Troponin I: <0.3 ng/mL (ref <0.30)

## 2013-12-08 LAB — IRON AND TIBC
Iron: 60 ug/dL (ref 42–135)
Saturation Ratios: 18 % — ABNORMAL LOW (ref 20–55)
TIBC: 341 ug/dL (ref 215–435)
UIBC: 281 ug/dL (ref 125–400)

## 2013-12-08 LAB — VITAMIN B12: Vitamin B-12: 902 pg/mL (ref 211–911)

## 2013-12-08 LAB — TSH: TSH: 1.65 u[IU]/mL (ref 0.350–4.500)

## 2013-12-08 LAB — OSMOLALITY, URINE: Osmolality, Ur: 285 mOsm/kg — ABNORMAL LOW (ref 390–1090)

## 2013-12-08 LAB — CK: Total CK: 150 U/L (ref 7–232)

## 2013-12-08 LAB — FOLATE: Folate: 16.3 ng/mL

## 2013-12-08 LAB — MAGNESIUM: Magnesium: 2 mg/dL (ref 1.5–2.5)

## 2013-12-08 LAB — OSMOLALITY: Osmolality: 271 mOsm/kg — ABNORMAL LOW (ref 275–300)

## 2013-12-08 MED ORDER — LORAZEPAM 1 MG PO TABS
1.0000 mg | ORAL_TABLET | Freq: Four times a day (QID) | ORAL | Status: DC | PRN
Start: 2013-12-08 — End: 2013-12-09

## 2013-12-08 MED ORDER — SODIUM CHLORIDE 0.9 % IV SOLN
INTRAVENOUS | Status: DC
  Administered 2013-12-08: 75 mL/h via INTRAVENOUS

## 2013-12-08 MED ORDER — ENOXAPARIN SODIUM 40 MG/0.4ML ~~LOC~~ SOLN
40.0000 mg | SUBCUTANEOUS | Status: DC
  Filled 2013-12-08 (×2): qty 0.4

## 2013-12-08 MED ORDER — PIPERACILLIN-TAZOBACTAM 3.375 G IVPB 30 MIN
3.3750 g | Freq: Once | INTRAVENOUS | Status: DC
  Filled 2013-12-08: qty 50

## 2013-12-08 MED ORDER — BUDESONIDE-FORMOTEROL FUMARATE 80-4.5 MCG/ACT IN AERO
2.0000 | INHALATION_SPRAY | Freq: Every day | RESPIRATORY_TRACT | Status: DC | PRN
  Filled 2013-12-08: qty 6.9

## 2013-12-08 MED ORDER — ATORVASTATIN CALCIUM 10 MG PO TABS
10.0000 mg | ORAL_TABLET | Freq: Every day | ORAL | Status: DC
  Filled 2013-12-08 (×2): qty 1

## 2013-12-08 MED ORDER — VANCOMYCIN HCL IN DEXTROSE 1-5 GM/200ML-% IV SOLN: 1000.0000 mg | Freq: Once | INTRAVENOUS | Status: DC

## 2013-12-08 MED ORDER — HEPARIN SODIUM (PORCINE) 5000 UNIT/ML IJ SOLN
5000.0000 [IU] | Freq: Three times a day (TID) | INTRAMUSCULAR | Status: DC
  Administered 2013-12-08: 5000 [IU] via SUBCUTANEOUS
  Filled 2013-12-08 (×4): qty 1

## 2013-12-08 MED ORDER — ASPIRIN EC 81 MG PO TBEC
81.0000 mg | DELAYED_RELEASE_TABLET | Freq: Every day | ORAL | Status: DC
  Filled 2013-12-08 (×2): qty 1

## 2013-12-08 MED ORDER — LORAZEPAM 2 MG/ML IJ SOLN: 1.0000 mg | Freq: Four times a day (QID) | INTRAMUSCULAR | Status: DC | PRN

## 2013-12-08 MED ORDER — ADULT MULTIVITAMIN W/MINERALS CH
1.0000 | ORAL_TABLET | Freq: Every day | ORAL | Status: DC
  Filled 2013-12-08 (×2): qty 1

## 2013-12-08 MED ORDER — FOLIC ACID 1 MG PO TABS
1.0000 mg | ORAL_TABLET | Freq: Every day | ORAL | Status: DC
  Filled 2013-12-08 (×2): qty 1

## 2013-12-08 MED ORDER — ALBUTEROL SULFATE (2.5 MG/3ML) 0.083% IN NEBU: 2.5000 mg | INHALATION_SOLUTION | Freq: Four times a day (QID) | RESPIRATORY_TRACT | Status: DC | PRN

## 2013-12-08 MED ORDER — ALBUTEROL SULFATE (2.5 MG/3ML) 0.083% IN NEBU
5.0000 mg | INHALATION_SOLUTION | Freq: Once | RESPIRATORY_TRACT | Status: AC
  Administered 2013-12-08: 5 mg via RESPIRATORY_TRACT
  Filled 2013-12-08: qty 6

## 2013-12-08 MED ORDER — SODIUM CHLORIDE 0.9 % IJ SOLN
3.0000 mL | Freq: Two times a day (BID) | INTRAMUSCULAR | Status: DC
  Administered 2013-12-08: 3 mL via INTRAVENOUS

## 2013-12-08 MED ORDER — VITAMIN B-1 100 MG PO TABS
100.0000 mg | ORAL_TABLET | Freq: Every day | ORAL | Status: DC
  Filled 2013-12-08 (×2): qty 1

## 2013-12-08 MED ORDER — ACETAMINOPHEN 325 MG PO TABS: 650.0000 mg | ORAL_TABLET | Freq: Four times a day (QID) | ORAL | Status: DC | PRN

## 2013-12-08 MED ORDER — THIAMINE HCL 100 MG/ML IJ SOLN
100.0000 mg | Freq: Every day | INTRAMUSCULAR | Status: DC
  Filled 2013-12-08: qty 1

## 2013-12-08 MED ORDER — ONDANSETRON HCL 4 MG PO TABS: 4.0000 mg | ORAL_TABLET | Freq: Four times a day (QID) | ORAL | Status: DC | PRN

## 2013-12-08 MED ORDER — ONDANSETRON HCL 4 MG/2ML IJ SOLN: 4.0000 mg | Freq: Four times a day (QID) | INTRAMUSCULAR | Status: DC | PRN

## 2013-12-08 MED ORDER — ACETAMINOPHEN 650 MG RE SUPP: 650.0000 mg | Freq: Four times a day (QID) | RECTAL | Status: DC | PRN

## 2013-12-08 NOTE — Progress Notes (Signed)
Patient admitted after midnight by Dr. Posey Pronto- Plan t d/c in AM after 24 hour observation Needs to stop alcohol CBC, BMP in AM Tele obs Janett Billow

## 2013-12-08 NOTE — ED Notes (Signed)
Orthostatic VS completed. 

## 2013-12-08 NOTE — H&P (Signed)
[facility] History and Physical  Patient: [name]  YQI:[id number]  DOB: 1950-07-28  DOS: the patient was seen and examined on 12/08/2013 PCP: Ria Bush, MD  Chief Complaint: Syncope  HPI: CHRYSTOPHER [name] is a 63 y.o. male with Past medical history of COPD, hypertension, alcohol abuse: Active smoker, lumbar stenosis. Patient presents with complaints of a syncopal episode. He mentions that he has leg cramps in both legs since last few months which is holding him in sleep prematurity. The cramps improve when he walks. Today he took Cialis, 3-4 hours after that he will call from the sleep with leg cramps, was walking to the kitchen, standing close to the sink. His wife was rubbing his legs to relieve the cramps. At which time all of a sudden he passed out. There was no head injury or neck injury. There was no confusion when he woke up. He was out for a minute. Initially wife tried to measure his blood pressure and the machine was reading error. The next minute the blood pressure was read as 70/38. 20 minutes later the blood pressure was 110/70. Patient was brought to the hospital for further workup. Pt denies any fever, chills, headache, cough, chest pain, palpitation, shortness of breath, orthopnea, PND, nausea, vomiting, abdominal pain, diarrhea, constipation, active bleeding, burning urination, pedal edema,  focal neurological deficit.  He complains of on and off dizziness happening since last one week both at rest as well as lying down which feels like he has slept too much. He drinks 6-8 beers a day. Wakes up every hour in the night to urinate, and also urinates almost every hour during the day since long. Active smoker. Has been taking Cialis for a while  The patient is coming from home. And at his baseline independent for most of his ADL.  Review of Systems: as mentioned in the history of present illness.  A Comprehensive review of the other systems is negative.  Past  Medical History  Diagnosis Date  . Allergic rhinitis 07/1998  . COPD (chronic obstructive pulmonary disease) 07/1998    w ongoing tobacco use, spirometry (02/2013)  . Hyperlipidemia 06/1996  . HTN (hypertension) 05/1998  . ETOH abuse   . Smoker   . Sciatic nerve injury   . Hepatitis     pos test once not now  . Lumbar spinal stenosis 03/2013    s/p laminectomy   Past Surgical History  Procedure Laterality Date  . Back surgery  2007  . Neck fusion  2003  . Colonoscopy  09/2010    polyps, diverticula, hemorrhoids.  Rpt 5 yrs  . Ett  2010    normal, ABIs normal  . US echocardiography  01/2013    WNL, mild diastolic dysfunction  . Spriometry  02/2013    COPD with some restriction thought to be obesity related ([place])  . Shoulder arthroscopy Right 11  . Knee arthroscopy Right 11    cartlage, ligaments  . Knee arthroscopy Left 84  . Lumbar laminectomy/decompression microdiscectomy Left 03/27/2013    Procedure: LUMBAR LAMINECTOMY/DECOMPRESSION MICRODISCECTOMY 2 LEVELS;  Surgeon: Ophelia Charter, MD;  Location: [facility];  Service: Neurosurgery;  Laterality: Left;  LEFT Lumbar Three Four diskectomy with Lumbar Three-Four, Four-Five Laminectomy. L3/4 ahd L4/5 for LSS [name])    Social History:  reports that he has been smoking Cigarettes.  He has a 20 pack-year smoking history. He uses smokeless tobacco. He reports that he drinks about 33.6 ounces of alcohol per week.  He reports that he does not use illicit drugs.  No Known Allergies  Family History  Problem Relation Age of Onset  . Coronary artery disease Mother   . Hypertension Mother   . Stroke Mother   . Coronary artery disease Father   . Hypertension Father   . Diabetes Father   . Stroke Father   . Pneumonia Sister   . Irregular heart beat Brother   . Maple syrup urine disease Brother   . Cancer Neg Hx     Prior to Admission medications   Medication Sig Start Date End Date Taking? Authorizing Provider  aspirin EC  81 MG tablet Take 81 mg by mouth daily.   Yes Historical Provider, MD  atorvastatin (LIPITOR) 10 MG tablet Take 1 tablet (10 mg total) by mouth daily. 11/30/12  Yes Ria Bush, MD  budesonide-formoterol [facility]) 80-4.5 MCG/ACT inhaler Inhale 2 puffs into the lungs daily as needed (shortness of breath).   Yes Historical Provider, MD  lisinopril (PRINIVIL,ZESTRIL) 10 MG tablet Take 10 mg by mouth daily.   Yes Historical Provider, MD  OVER THE COUNTER MEDICATION Take 1 capsule by mouth daily. [employer] (Bones, Ligaments and muscles)   Yes Historical Provider, MD  OVER THE COUNTER MEDICATION Take 1 capsule by mouth daily. [employer] B   Yes Historical Provider, MD  OVER THE COUNTER MEDICATION Take 1 capsule by mouth daily. [employer] Sulfurzyme (for healthy immune system and to boost liver function   Yes Historical Provider, MD  Riboflavin (VITAMIN B-2 PO) Take 1 tablet by mouth daily.   Yes Historical Provider, MD  tadalafil (CIALIS) 20 MG tablet Take 1 tablet (20 mg total) by mouth daily as needed for erectile dysfunction. 08/26/13  Yes Ria Bush, MD    Physical Exam: Filed Vitals:   12/08/13 0230 12/08/13 0301 12/08/13 0306 12/08/13 0348  BP: 136/67  135/71 131/60  Pulse: 72  76 82  Temp:    98.2 F (36.8 C)  TempSrc:    Oral  Resp: 15  11 18   Height:    5\' 7"  (1.702 m)  Weight:    96.7 kg (213 lb 3 oz)  SpO2: 93% 93%  98%    General: Alert, Awake and Oriented to Time, Place and Person. Appear in mild distress Eyes: PERRL ENT: Oral Mucosa clear moist. Neck: No JVD Cardiovascular: S1 and S2 Present, no Murmur, Peripheral Pulses Present Respiratory: Bilateral Air entry equal and Decreased, Clear to Auscultation,  No Crackles, no wheezes Abdomen: Bowel Sound Present, Soft and Non tender Skin: No Rash Extremities: No Pedal edema, no calf tenderness Neurologic: Grossly no focal neuro deficit. Mental status AAOx3, speech normal, attention normal, Cranial  Nerves PERRL, EOM normal and present, facial sensation to light touch present: , Motor strength bilateral equal strength 5/5, Sensation present to light touch, reflexes present knee and biceps, babinski negative, Proprioception normal, Cerebellar test normal finger nose finger.  Labs on Admission:  CBC:  Recent Labs Lab 12/07/13 2237  WBC 9.3  NEUTROABS 6.4  HGB 12.3*  HCT 34.1*  MCV 94.2  PLT 197    CMP     Component Value Date/Time   NA 127* 12/07/2013 2237   K 3.7 12/07/2013 2237   CL 90* 12/07/2013 2237   CO2 19 12/07/2013 2237   GLUCOSE 126* 12/07/2013 2237   BUN 16 12/07/2013 2237   CREATININE 1.36* 12/07/2013 2237   CALCIUM 8.0* 12/07/2013 2237   PROT 6.3 12/07/2013 2237  ALBUMIN 3.7 12/07/2013 2237   AST 20 12/07/2013 2237   ALT 25 12/07/2013 2237   ALKPHOS 72 12/07/2013 2237   BILITOT 0.3 12/07/2013 2237   GFRNONAA 54* 12/07/2013 2237   GFRAA 63* 12/07/2013 2237    No results found for this basename: LIPASE, AMYLASE,  in the last 168 hours No results found for this basename: AMMONIA,  in the last 168 hours   Recent Labs Lab 12/07/13 2237  TROPONINI <0.30   BNP (last 3 results) No results found for this basename: PROBNP,  in the last 8760 hours  Radiological Exams on Admission: Dg Chest 2 View  12/07/2013   CLINICAL DATA:  Loss of consciousness.  EXAM: CHEST  2 VIEW  COMPARISON:  03/08/2013  FINDINGS: The heart size and pulmonary vascularity are normal. There is chronic peribronchial thickening with flattening of the diaphragm consistent with COPD. No acute osseous abnormalities. No infiltrates or effusions.  IMPRESSION: No acute abnormalities.  Chronic bronchitic changes with COPD.   Electronically Signed   By: Rozetta Nunnery M.D.   On: 12/07/2013 23:51   Ct Head Wo Contrast  12/08/2013   CLINICAL DATA:  Syncope. Lightheaded and dizziness. Intermittent leg cramping. Nausea and shortness of breath.  EXAM: CT HEAD WITHOUT CONTRAST  TECHNIQUE: Contiguous axial images were  obtained from the base of the skull through the vertex without intravenous contrast.  COMPARISON:  None.  FINDINGS: There is no evidence of acute infarction, mass lesion, or intra- or extra-axial hemorrhage on CT.  The posterior fossa, including the cerebellum, brainstem and fourth ventricle, is within normal limits. The third and lateral ventricles, and basal ganglia are unremarkable in appearance. The cerebral hemispheres are symmetric in appearance, with normal gray-white differentiation. No mass effect or midline shift is seen.  There is no evidence of fracture; visualized osseous structures are unremarkable in appearance. The orbits are within normal limits. The paranasal sinuses and mastoid air cells are well-aerated. No significant soft tissue abnormalities are seen.  IMPRESSION: Unremarkable noncontrast CT of the head.   Electronically Signed   By: Garald Balding M.D.   On: 12/08/2013 01:24    EKG: Independently reviewed. normal EKG, normal sinus rhythm.  Assessment/Plan Principal Problem:   Syncope Active Problems:   HYPERTENSION   COPD   Smoker   Hyponatremia   Bilateral leg cramps   History of nocturia   Alcohol abuse   1. Syncope The patient is presenting with a syncopal episode. His blood pressure was found to be lower initially. Most likely syncopal episode appears to be neurohumoral/vasovagal. At present. Due to that he be treated for observation, I will obtain serial troponin. PTOT consultation may be needed. Gentle IV hydration.  2. Alcohol abuse. Hyponatremia Excessive urination Patient drinks 6-8 beers a day. Most likely that is the cause of his hyponatremia as well as excessive urination. Recheck his BMP levels. Patient recommended to stop any alcohol. Placed on single protocol. Check urine and serum osmolality.  3. COPD Continue to monitor  4. hypertension Continue antihypertensi  DVT Prophylaxis: subcutaneous Heparin Nutrition: cardiac diet  Code Status:   Full  Family Communication:  Family was present at bedside, opportunity was given to ask question and all questions were answered satisfactorily at the time of interview. Disposition: Admitted to observation in telemetry unit.  Author: Berle Mull, MD [facility] Pager: [phone] 12/08/2013, 4:46 AM    If 7PM-7AM, please contact night-coverage [email or web address] Password [email or web address]

## 2013-12-08 NOTE — Progress Notes (Signed)
Pt admitted to 2W26 from ER came by stretcher, VSS, denies any pain or discomfort at [facility] time. Pt placed on tele monitor, Dr. Posey Pronto paged on arrival to the unit. Pt resting comfortable on bed family member at the beside, pt oriented to the unit ant to his room and encouraged to call for assistance to get OOB to prevent any fall or injury while in the hospital.

## 2013-12-08 NOTE — ED Notes (Signed)
Report called to 2W

## 2013-12-09 LAB — CBC
HCT: 37.3 % — ABNORMAL LOW (ref 39.0–52.0)
Hemoglobin: 12.3 g/dL — ABNORMAL LOW (ref 13.0–17.0)
MCH: 32 pg (ref 26.0–34.0)
MCHC: 33 g/dL (ref 30.0–36.0)
MCV: 97.1 fL (ref 78.0–100.0)
Platelets: 190 K/uL (ref 150–400)
RBC: 3.84 MIL/uL — ABNORMAL LOW (ref 4.22–5.81)
RDW: 13.1 % (ref 11.5–15.5)
WBC: 5.6 K/uL (ref 4.0–10.5)

## 2013-12-09 LAB — BASIC METABOLIC PANEL WITH GFR
BUN: 12 mg/dL (ref 6–23)
CO2: 25 meq/L (ref 19–32)
Calcium: 8.9 mg/dL (ref 8.4–10.5)
Chloride: 101 meq/L (ref 96–112)
Creatinine, Ser: 0.87 mg/dL (ref 0.50–1.35)
GFR calc Af Amer: >90 mL/min
GFR calc non Af Amer: >90 mL/min
Glucose, Bld: 97 mg/dL (ref 70–99)
Potassium: 5 meq/L (ref 3.7–5.3)
Sodium: 135 meq/L — ABNORMAL LOW (ref 137–147)

## 2013-12-09 NOTE — Progress Notes (Signed)
Discharged to home with family  teaching done  

## 2013-12-09 NOTE — Discharge Summary (Signed)
Physician Discharge Summary  [name] QIW:[id number] DOB: [birth year] DOA: 12/07/2013  PCP: Ria Bush, MD  Admit date: 12/07/2013 Discharge date: 12/09/2013  Time spent: 35 minutes  Recommendations for Outpatient Follow-up:  1. Home- stop alcohol 2. Cbc, bmp 1 week  Discharge Diagnoses:  Principal Problem:   Syncope Active Problems:   HYPERTENSION   COPD   Smoker   Hyponatremia   Bilateral leg cramps   History of nocturia   Alcohol abuse   Discharge Condition: improved  Diet recommendation: cardiac  Filed Weights   12/08/13 0348 12/09/13 0537  Weight: 96.7 kg (213 lb 3 oz) 94.2 kg (207 lb 10.8 oz)    History of present illness:  [name] is a 63 y.o. male with Past medical history of COPD, hypertension, alcohol abuse: Active smoker, lumbar stenosis.  Patient presents with complaints of a syncopal episode. He mentions that he has leg cramps in both legs since last few months which is holding him in sleep prematurity. The cramps improve when he walks. Today he took Cialis, 3-4 hours after that he will call from the sleep with leg cramps, was walking to the kitchen, standing close to the sink. His wife was rubbing his legs to relieve the cramps. At which time all of a sudden he passed out. There was no head injury or neck injury. There was no confusion when he woke up. He was out for a minute. Initially wife tried to measure his blood pressure and the machine was reading error. The next minute the blood pressure was read as 70/38. 20 minutes later the blood pressure was 110/70. Patient was brought to the hospital for further workup.  Pt denies any fever, chills, headache, cough, chest pain, palpitation, shortness of breath, orthopnea, PND, nausea, vomiting, abdominal pain, diarrhea, constipation, active bleeding, burning urination, pedal edema, focal neurological deficit.  He complains of on and off dizziness happening since last one week both at rest as well as  lying down which feels like he has slept too much.  He drinks 6-8 beers a day. Wakes up every hour in the night to urinate, and also urinates almost every hour during the day since long.  Active smoker.  Has been taking Cialis for a while  The patient is coming from home. And at his baseline independent for most of his ADL.      Hospital Course:  Syncope  The patient is presenting with a syncopal episode. His blood pressure was found to be lower initially. Most likely syncopal episode vasovagal.  CE negative Tele ok     Alcohol abuse.  Hyponatremia  Excessive urination  Patient drinks 6-8 beers a day. Most likely that is the cause of his hyponatremia as well as excessive urination.  Patient recommended to stop any alcohol.   COPD  Stable   hypertension  Continue antihypertensive meds from home   Procedures:  none  Consultations:  none  Discharge Exam: Filed Vitals:   12/09/13 0537  BP: 133/69  Pulse: 85  Temp: 97.9 F (36.6 C)  Resp: 18    General: A+Ox3, NAd Cardiovascular: rrr Respiratory: clear anterior  Discharge Instructions You were cared for by a hospitalist during your hospital stay. If you have any questions about your discharge medications or the care you received while you were in the hospital after you are discharged, you can call the unit and asked to speak with the hospitalist on call if the hospitalist that took care of you is not  available. Once you are discharged, your primary care physician will handle any further medical issues. Please note that NO REFILLS for any discharge medications will be authorized once you are discharged, as it is imperative that you return to your primary care physician (or establish a relationship with a primary care physician if you do not have one) for your aftercare needs so that they can reassess your need for medications and monitor your lab values.  Discharge Instructions   Diet - low sodium heart healthy     Complete by:  As directed      Discharge instructions    Complete by:  As directed   Cut out alcohol     Increase activity slowly    Complete by:  As directed             Medication List         aspirin EC 81 MG tablet  Take 81 mg by mouth daily.     atorvastatin 10 MG tablet  Commonly known as:  LIPITOR  Take 1 tablet (10 mg total) by mouth daily.     budesonide-formoterol 80-4.5 MCG/ACT inhaler  Commonly known as:  SYMBICORT  Inhale 2 puffs into the lungs daily as needed (shortness of breath).     lisinopril 10 MG tablet  Commonly known as:  PRINIVIL,ZESTRIL  Take 10 mg by mouth daily.     OVER THE COUNTER MEDICATION  Take 1 capsule by mouth daily. [employer] BLM (Bones, Ligaments and muscles)     OVER THE COUNTER MEDICATION  Take 1 capsule by mouth daily. [employer] B     OVER THE COUNTER MEDICATION  Take 1 capsule by mouth daily. [employer] Sulfurzyme (for healthy immune system and to boost liver function     tadalafil 20 MG tablet  Commonly known as:  CIALIS  Take 1 tablet (20 mg total) by mouth daily as needed for erectile dysfunction.     VITAMIN B-2 PO  Take 1 tablet by mouth daily.       No Known Allergies     Follow-up Information   Follow up with Ria Bush, MD In 1 week. (for BMP)    Specialty:  Family Medicine   Contact information:   [place] [address] [phone]        The results of significant diagnostics from this hospitalization (including imaging, microbiology, ancillary and laboratory) are listed below for reference.    Significant Diagnostic Studies: Dg Chest 2 View  12/07/2013   CLINICAL DATA:  Loss of consciousness.  EXAM: CHEST  2 VIEW  COMPARISON:  03/08/2013  FINDINGS: The heart size and pulmonary vascularity are normal. There is chronic peribronchial thickening with flattening of the diaphragm consistent with COPD. No acute osseous abnormalities. No infiltrates or effusions.   IMPRESSION: No acute abnormalities.  Chronic bronchitic changes with COPD.   Electronically Signed   By: Rozetta Nunnery M.D.   On: 12/07/2013 23:51   Ct Head Wo Contrast  12/08/2013   CLINICAL DATA:  Syncope. Lightheaded and dizziness. Intermittent leg cramping. Nausea and shortness of breath.  EXAM: CT HEAD WITHOUT CONTRAST  TECHNIQUE: Contiguous axial images were obtained from the base of the skull through the vertex without intravenous contrast.  COMPARISON:  None.  FINDINGS: There is no evidence of acute infarction, mass lesion, or intra- or extra-axial hemorrhage on CT.  The posterior fossa, including the cerebellum, brainstem and fourth ventricle, is within normal limits.  The third and lateral ventricles, and basal ganglia are unremarkable in appearance. The cerebral hemispheres are symmetric in appearance, with normal gray-white differentiation. No mass effect or midline shift is seen.  There is no evidence of fracture; visualized osseous structures are unremarkable in appearance. The orbits are within normal limits. The paranasal sinuses and mastoid air cells are well-aerated. No significant soft tissue abnormalities are seen.  IMPRESSION: Unremarkable noncontrast CT of the head.   Electronically Signed   By: Garald Balding M.D.   On: 12/08/2013 01:24    Microbiology: No results found for this or any previous visit (from the past 240 hour(s)).   Labs: Basic Metabolic Panel:  Recent Labs Lab 12/07/13 2237 12/08/13 0525 12/09/13 0352  NA 127* 134* 135*  K 3.7 4.5 5.0  CL 90* 100 101  CO2 19 22 25   GLUCOSE 126* 107* 97  BUN 16 12 12   CREATININE 1.36* 1.03 0.87  CALCIUM 8.0* 8.6 8.9  MG 2.0  --   --    Liver Function Tests:  Recent Labs Lab 12/07/13 2237 12/08/13 0525  AST 20 23  ALT 25 29  ALKPHOS 72 72  BILITOT 0.3 0.4  PROT 6.3 6.5  ALBUMIN 3.7 3.9   No results found for this basename: LIPASE, AMYLASE,  in the last 168 hours No results found for this basename: AMMONIA,   in the last 168 hours CBC:  Recent Labs Lab 12/07/13 2237 12/08/13 0525 12/09/13 0352  WBC 9.3 6.5 5.6  NEUTROABS 6.4  --   --   HGB 12.3* 12.1* 12.3*  HCT 34.1* 33.9* 37.3*  MCV 94.2 93.6 97.1  PLT 197 184 190   Cardiac Enzymes:  Recent Labs Lab 12/07/13 2237 12/08/13 0525 12/08/13 1205 12/08/13 1615  CKTOTAL  --  150  --   --   TROPONINI <0.30 <0.30 <0.30 <0.30   BNP: BNP (last 3 results) No results found for this basename: PROBNP,  in the last 8760 hours CBG:  Recent Labs Lab 12/07/13 2224  GLUCAP 117*       Signed:  Geradine Girt  [facility] 12/09/2013, 7:53 AM

## 2013-12-09 NOTE — Progress Notes (Signed)
Pt. Refused to take meds all day pt. Refused to walk

## 2013-12-09 NOTE — Progress Notes (Signed)
Discharged to home with family  teaching done

## 2013-12-10 NOTE — ED Provider Notes (Signed)
.  I saw and evaluated the patient, reviewed the resident's note and I agree with the findings and plan.   EKG Interpretation   Date/Time:  Saturday Dec 07 2013 22:46:48 EDT Ventricular Rate:  75 PR Interval:  181 QRS Duration: 105 QT Interval:  383 QTC Calculation: 428 R Axis:   76 Text Interpretation:  Sinus rhythm ED PHYSICIAN INTERPRETATION AVAILABLE  IN [place] Confirmed by TEST, Record ([id number]) on 12/09/2013 10:42:53  AM       Leota Jacobsen, MD 12/10/13 2010

## 2013-12-17 ENCOUNTER — Ambulatory Visit: Payer: BC Managed Care – PPO | Admitting: Family Medicine

## 2013-12-23 ENCOUNTER — Encounter: Payer: Self-pay | Admitting: Family Medicine

## 2013-12-23 ENCOUNTER — Ambulatory Visit (INDEPENDENT_AMBULATORY_CARE_PROVIDER_SITE_OTHER): Payer: BC Managed Care – PPO | Admitting: Family Medicine

## 2013-12-23 ENCOUNTER — Telehealth: Payer: Self-pay | Admitting: Family Medicine

## 2013-12-23 ENCOUNTER — Other Ambulatory Visit: Payer: Self-pay | Admitting: Family Medicine

## 2013-12-23 VITALS — BP 136/72 | HR 71 | Temp 98.5°F | Wt 210.2 lb

## 2013-12-23 DIAGNOSIS — E871 Hypo-osmolality and hyponatremia: Secondary | ICD-10-CM

## 2013-12-23 DIAGNOSIS — I10 Essential (primary) hypertension: Secondary | ICD-10-CM

## 2013-12-23 DIAGNOSIS — R7309 Other abnormal glucose: Secondary | ICD-10-CM

## 2013-12-23 DIAGNOSIS — M79609 Pain in unspecified limb: Secondary | ICD-10-CM

## 2013-12-23 DIAGNOSIS — F17200 Nicotine dependence, unspecified, uncomplicated: Secondary | ICD-10-CM

## 2013-12-23 DIAGNOSIS — D649 Anemia, unspecified: Secondary | ICD-10-CM

## 2013-12-23 DIAGNOSIS — R252 Cramp and spasm: Secondary | ICD-10-CM

## 2013-12-23 DIAGNOSIS — R55 Syncope and collapse: Secondary | ICD-10-CM

## 2013-12-23 DIAGNOSIS — F1010 Alcohol abuse, uncomplicated: Secondary | ICD-10-CM

## 2013-12-23 DIAGNOSIS — E669 Obesity, unspecified: Secondary | ICD-10-CM

## 2013-12-23 LAB — BASIC METABOLIC PANEL
BUN: 14 mg/dL (ref 6–23)
CO2: 28 mEq/L (ref 19–32)
Calcium: 9.3 mg/dL (ref 8.4–10.5)
Chloride: 101 mEq/L (ref 96–112)
Creatinine, Ser: 0.9 mg/dL (ref 0.4–1.5)
GFR: 90.73 mL/min (ref >60.00)
Glucose, Bld: 212 mg/dL — ABNORMAL HIGH (ref 70–99)
Potassium: 4 mEq/L (ref 3.5–5.1)
Sodium: 135 mEq/L (ref 135–145)

## 2013-12-23 NOTE — Assessment & Plan Note (Signed)
Recheck BMP today - anticipate resolved vs mildly low 2/2 beer-drinker's potomania.

## 2013-12-23 NOTE — Patient Instructions (Signed)
Blood work today. Watch alcohol, ensure staying hydrated, and work on cutting back on smoking. Good to see you today, return as needed or in Feb for next physical.

## 2013-12-23 NOTE — Assessment & Plan Note (Signed)
Now resolved after IVF replacement in hospital.

## 2013-12-23 NOTE — Assessment & Plan Note (Signed)
Continue to encourage cessation. Contemplative. 

## 2013-12-23 NOTE — Assessment & Plan Note (Signed)
Attributed to dehydration, EtOH use and cialis use. Now feeling better.  Reviewed contributing factors to recent vasovagal syncope.

## 2013-12-23 NOTE — Telephone Encounter (Signed)
Relevant patient education assigned to patient using Emmi. ° °

## 2013-12-23 NOTE — Assessment & Plan Note (Signed)
Again discussed EtOH use - encouraged decreased intake. Encouraged increased water intake to prevent further syncopal episodes.

## 2013-12-23 NOTE — Progress Notes (Signed)
Pre visit review using our clinic review tool, if applicable. No additional management support is needed unless otherwise documented below in the visit note. 

## 2013-12-23 NOTE — Assessment & Plan Note (Signed)
Continue to encourage regular exercise for sustainable weight loss.

## 2013-12-23 NOTE — Progress Notes (Signed)
BP 136/72  Pulse 71  Temp(Src) 98.5 F (36.9 C) (Oral)  Wt 210 lb 4 oz (95.369 kg)  SpO2 97%   CC: hosp f/u  Subjective:    Patient ID: [name], male    DOB: 1951/04/05, 63 y.o.   MRN: [id number]  HPI: [name] is a 63 y.o. male presenting on 12/23/2013 for Follow-up   Presents today for hospital f/u visit for recent syncopal episode that happened at night time after cialis use with low blood pressures at ER. This was after more EtOH use than he was used to, thought dehydration contributed. Dx with vasovagal syncope. He has also had some leg cramps and was advised to have BMP repeated today.  IVF repleted, no further leg cramps.  Drinks 6-8 beers/day. Continues drinking same amount. Attributed nocturia, polyuria and hyponatremia to this.  Continues feeling intermittently lightheaded described as abnormal sensation in frontal head. No vertigo, no presyncope, no chest pain, no palpitations, no dyspnea, no significant dizziness - has trouble describing this.  Smoking 1/2 ppd - h/o COPD, takes symbicort prn. Now into essential oils.   Admit date: 12/07/2013  Discharge date: 12/09/2013  F/u phone call not made Recommendations for Outpatient Follow-up:  1. Home- stop alcohol 2. Cbc, bmp 1 week Discharge Diagnoses:  Principal Problem:  Syncope  Active Problems:  HYPERTENSION  COPD  Smoker  Hyponatremia  Bilateral leg cramps  History of nocturia  Alcohol abuse  History of present illness:  [name] is a 63 y.o. male with Past medical history of COPD, hypertension, alcohol abuse: Active smoker, lumbar stenosis.  Patient presents with complaints of a syncopal episode. He mentions that he has leg cramps in both legs since last few months which is holding him in sleep prematurity. The cramps improve when he walks. Today he took Cialis, 3-4 hours after that he will call from the sleep with leg cramps, was walking to the kitchen, standing close to the sink. His  wife was rubbing his legs to relieve the cramps. At which time all of a sudden he passed out. There was no head injury or neck injury. There was no confusion when he woke up. He was out for a minute. Initially wife tried to measure his blood pressure and the machine was reading error. The next minute the blood pressure was read as 70/38. 20 minutes later the blood pressure was 110/70. Patient was brought to the hospital for further workup.  Pt denies any fever, chills, headache, cough, chest pain, palpitation, shortness of breath, orthopnea, PND, nausea, vomiting, abdominal pain, diarrhea, constipation, active bleeding, burning urination, pedal edema, focal neurological deficit.  He complains of on and off dizziness happening since last one week both at rest as well as lying down which feels like he has slept too much.  He drinks 6-8 beers a day. Wakes up every hour in the night to urinate, and also urinates almost every hour during the day since long.  Active smoker.  Has been taking Cialis for a while  The patient is coming from home. And at his baseline independent for most of his ADL.   Hospital Course:  Syncope  The patient is presenting with a syncopal episode. His blood pressure was found to be lower initially. Most likely syncopal episode vasovagal.  CE negative  Tele ok  Alcohol abuse.  Hyponatremia  Excessive urination  Patient drinks 6-8 beers a day. Most likely that is the cause of his hyponatremia as well  as excessive urination.  Patient recommended to stop any alcohol.  COPD  Stable  hypertension  Continue antihypertensive meds from home  CT HEAD WITHOUT CONTRAST  TECHNIQUE:  Contiguous axial images were obtained from the base of the skull  through the vertex without intravenous contrast.  COMPARISON: None.  FINDINGS:  There is no evidence of acute infarction, mass lesion, or intra- or  extra-axial hemorrhage on CT.  The posterior fossa, including the cerebellum,  brainstem and fourth  ventricle, is within normal limits. The third and lateral  ventricles, and basal ganglia are unremarkable in appearance. The  cerebral hemispheres are symmetric in appearance, with normal  gray-white differentiation. No mass effect or midline shift is seen.  There is no evidence of fracture; visualized osseous structures are  unremarkable in appearance. The orbits are within normal limits. The  paranasal sinuses and mastoid air cells are well-aerated. No  significant soft tissue abnormalities are seen.  IMPRESSION:  Unremarkable noncontrast CT of the head.  Electronically Signed  By: Garald Balding M.D.  On: 12/08/2013 01:24  Relevant past medical, surgical, family and social history reviewed and updated as indicated.  Allergies and medications reviewed and updated. Current Outpatient Prescriptions on File Prior to Visit  Medication Sig  . aspirin EC 81 MG tablet Take 81 mg by mouth daily.  [name] atorvastatin (LIPITOR) 10 MG tablet Take 1 tablet (10 mg total) by mouth daily.  . budesonide-formoterol (SYMBICORT) 80-4.5 MCG/ACT inhaler Inhale 2 puffs into the lungs daily as needed (shortness of breath).  [name] lisinopril (PRINIVIL,ZESTRIL) 10 MG tablet Take 10 mg by mouth daily.  [name] OVER THE COUNTER MEDICATION Take 1 capsule by mouth daily. [employer] BLM (Bones, Ligaments and muscles)  . OVER THE COUNTER MEDICATION Take 1 capsule by mouth daily. [employer] B  . OVER THE COUNTER MEDICATION Take 1 capsule by mouth daily. [employer] Sulfurzyme (for healthy immune system and to boost liver function  . Riboflavin (VITAMIN B-2 PO) Take 1 tablet by mouth daily.  . tadalafil (CIALIS) 20 MG tablet Take 1 tablet (20 mg total) by mouth daily as needed for erectile dysfunction.   No current facility-administered medications on file prior to visit.    Review of Systems Per HPI unless specifically indicated above    Objective:    BP 136/72  Pulse 71  Temp(Src) 98.5 F  (36.9 C) (Oral)  Wt 210 lb 4 oz (95.369 kg)  SpO2 97%  Physical Exam  Nursing note and vitals reviewed. Constitutional: He appears well-developed and well-nourished. No distress.  HENT:  Mouth/Throat: Oropharynx is clear and moist. No oropharyngeal exudate.  Eyes: Conjunctivae and EOM are normal. Pupils are equal, round, and reactive to light.  Neck: Normal range of motion. Neck supple. Carotid bruit is not present. No thyromegaly present.  Cardiovascular: Normal rate, regular rhythm, normal heart sounds and intact distal pulses.   No murmur heard. Pulmonary/Chest: Effort normal. No respiratory distress. He has no decreased breath sounds. He has no wheezes. He has rhonchi (initial coarse rhonchi that clear with deep breath). He has no rales.  Musculoskeletal: He exhibits no edema.  Lymphadenopathy:    He has no cervical adenopathy.  Neurological: He has normal strength. No cranial nerve deficit or sensory deficit. He displays a negative Romberg sign. Coordination normal.  CN 2-12 intact Normal FTN No pronator drift  Skin: Skin is warm and dry. No rash noted.  Psychiatric: He has a normal mood and affect.  Assessment & Plan:   Problem List Items Addressed This Visit   Syncope - Primary     Attributed to dehydration, EtOH use and cialis use. Now feeling better.  Reviewed contributing factors to recent vasovagal syncope.    Relevant Orders      Basic metabolic panel   Smoker     Continue to encourage cessation. Contemplative.    OBESITY     Continue to encourage regular exercise for sustainable weight loss.    Hyponatremia     Recheck BMP today - anticipate resolved vs mildly low 2/2 beer-drinker's potomania.    Relevant Orders      Basic metabolic panel   HYPERTENSION   Relevant Orders      Basic metabolic panel   Bilateral leg cramps     Now resolved after IVF replacement in hospital.    Alcohol abuse     Again discussed EtOH use - encouraged decreased  intake. Encouraged increased water intake to prevent further syncopal episodes.        Follow up plan: Return as needed, for annual exam, prior fasting for blood work.

## 2014-01-21 ENCOUNTER — Other Ambulatory Visit: Payer: Self-pay | Admitting: Family Medicine

## 2014-03-20 ENCOUNTER — Other Ambulatory Visit: Payer: Self-pay | Admitting: *Deleted

## 2014-03-20 MED ORDER — LISINOPRIL 10 MG PO TABS: ORAL_TABLET | ORAL | Status: DC

## 2014-04-16 ENCOUNTER — Ambulatory Visit (INDEPENDENT_AMBULATORY_CARE_PROVIDER_SITE_OTHER): Payer: BC Managed Care – PPO

## 2014-04-16 DIAGNOSIS — Z23 Encounter for immunization: Secondary | ICD-10-CM

## 2014-04-18 ENCOUNTER — Ambulatory Visit (INDEPENDENT_AMBULATORY_CARE_PROVIDER_SITE_OTHER): Payer: BC Managed Care – PPO | Admitting: Family Medicine

## 2014-04-18 ENCOUNTER — Encounter: Payer: Self-pay | Admitting: Family Medicine

## 2014-04-18 VITALS — BP 140/82 | HR 78 | Temp 98.2°F | Wt 220.2 lb

## 2014-04-18 DIAGNOSIS — H6503 Acute serous otitis media, bilateral: Secondary | ICD-10-CM

## 2014-04-18 DIAGNOSIS — H6593 Unspecified nonsuppurative otitis media, bilateral: Secondary | ICD-10-CM | POA: Insufficient documentation

## 2014-04-18 MED ORDER — AMOXICILLIN-POT CLAVULANATE 875-125 MG PO TABS: 1.0000 | ORAL_TABLET | Freq: Two times a day (BID) | ORAL | Status: AC

## 2014-04-18 MED ORDER — FLUTICASONE PROPIONATE 50 MCG/ACT NA SUSP: 2.0000 | Freq: Every day | NASAL | Status: DC

## 2014-04-18 NOTE — Progress Notes (Signed)
BP 140/82  Pulse 78  Temp(Src) 98.2 F (36.8 C) (Oral)  Wt 220 lb 4 oz (99.905 kg)  SpO2 94%   CC: "my ears are plugged up"  Subjective:    Patient ID: [name], male    DOB: [birth year], 63 y.o.   MRN: [id number]  HPI: SHERWIN HOLLINGSHED is a 63 y.o. male presenting on 04/18/2014 for Cerumen Impaction   1 mo h/o ears plugged. Has tried several different things including OTC ear drops and fingers. No qtips. No ear pain, congestion, rhinorrhea, draining from ears. R>L muffled hearing. Last irrigation was 3 yrs ago.  Received flu shot yesterday.  Relevant past medical, surgical, family and social history reviewed and updated as indicated.  Allergies and medications reviewed and updated. Current Outpatient Prescriptions on File Prior to Visit  Medication Sig  . aspirin EC 81 MG tablet Take 81 mg by mouth daily.  [name] atorvastatin (LIPITOR) 10 MG tablet Take 1 tablet (10 mg total) by mouth daily.  . budesonide-formoterol (SYMBICORT) 80-4.5 MCG/ACT inhaler Inhale 2 puffs into the lungs daily as needed (shortness of breath).  [name] lisinopril (PRINIVIL,ZESTRIL) 10 MG tablet TAKE 1 TABLET BY MOUTH ONCE A DAY  . OVER THE COUNTER MEDICATION Take 1 capsule by mouth daily. [employer] BLM (Bones, Ligaments and muscles)  . OVER THE COUNTER MEDICATION Take 1 capsule by mouth daily. [employer] B  . OVER THE COUNTER MEDICATION Take 1 capsule by mouth daily. [employer] Sulfurzyme (for healthy immune system and to boost liver function  . Riboflavin (VITAMIN B-2 PO) Take 1 tablet by mouth daily.  . tadalafil (CIALIS) 20 MG tablet Take 1 tablet (20 mg total) by mouth daily as needed for erectile dysfunction.   No current facility-administered medications on file prior to visit.    Review of Systems Per HPI unless specifically indicated above    Objective:    BP 140/82  Pulse 78  Temp(Src) 98.2 F (36.8 C) (Oral)  Wt 220 lb 4 oz (99.905 kg)  SpO2 94%  Physical Exam  Nursing note  and vitals reviewed. Constitutional: He appears well-developed and well-nourished. No distress.  HENT:  Head: Normocephalic and atraumatic.  Right Ear: Hearing, external ear and ear canal normal.  Left Ear: Hearing, external ear and ear canal normal.  Nose: Mucosal edema present. No rhinorrhea. Right sinus exhibits no maxillary sinus tenderness and no frontal sinus tenderness. Left sinus exhibits no maxillary sinus tenderness and no frontal sinus tenderness.  Mouth/Throat: Uvula is midline, oropharynx is clear and moist and mucous membranes are normal. No oropharyngeal exudate, posterior oropharyngeal edema, posterior oropharyngeal erythema or tonsillar abscesses.  Thick yellow fluid behind L TM, congested fluid behind R TM  Eyes: Conjunctivae and EOM are normal. Pupils are equal, round, and reactive to light. No scleral icterus.  Neck: Normal range of motion. Neck supple.  Lymphadenopathy:    He has no cervical adenopathy.  Skin: Skin is warm and dry. No rash noted.       Assessment & Plan:   Problem List Items Addressed This Visit   Bilateral serous otitis media - Primary     Thick discharge behind L TM, as well as congestion in R TM Treat serous otitis with augmentin course and flonase. Update if sxs persist or fail to improve. If persistent, consider ENT referral for further evaluation. Pt agrees with plan.    Relevant Medications      amoxicillin-clavulanate (AUGMENTIN) tablet 875-125 mg  Follow up plan: Return if symptoms worsen or fail to improve.

## 2014-04-18 NOTE — Patient Instructions (Signed)
I think you have serous otitis or thick fluid behind eardrums leading to hearing loss. No wax. Treat with antibiotic course and intranasal steroid. Update me with effect of treatment in 2-3 weeks. If persistent muffled hearing, I'd like you to see ENT doctor.

## 2014-04-18 NOTE — Progress Notes (Signed)
Pre visit review using our clinic review tool, if applicable. No additional management support is needed unless otherwise documented below in the visit note. 

## 2014-04-18 NOTE — Assessment & Plan Note (Signed)
Thick discharge behind L TM, as well as congestion in R TM Treat serous otitis with augmentin course and flonase. Update if sxs persist or fail to improve. If persistent, consider ENT referral for further evaluation. Pt agrees with plan.

## 2014-05-27 ENCOUNTER — Telehealth: Payer: Self-pay | Admitting: Family Medicine

## 2014-05-27 DIAGNOSIS — H6503 Acute serous otitis media, bilateral: Secondary | ICD-10-CM

## 2014-05-27 NOTE — Telephone Encounter (Signed)
ENT referral placed.

## 2014-05-27 NOTE — Telephone Encounter (Signed)
Pt called requesting referral to ENT. Pt states his ears are not getting any better after the medication.  Pt perfers to go to [facility]

## 2014-06-19 ENCOUNTER — Other Ambulatory Visit (HOSPITAL_COMMUNITY): Payer: Self-pay | Admitting: Orthopaedic Surgery

## 2014-06-19 ENCOUNTER — Encounter (HOSPITAL_COMMUNITY)
Admission: RE | Admit: 2014-06-19 | Discharge: 2014-06-19 | Disposition: A | Discharge status: Home / Self Care | Payer: BC Managed Care – PPO | Source: Ambulatory Visit | Attending: Orthopaedic Surgery | Admitting: Orthopaedic Surgery

## 2014-06-19 ENCOUNTER — Encounter (HOSPITAL_COMMUNITY): Payer: Self-pay

## 2014-06-19 DIAGNOSIS — Z01812 Encounter for preprocedural laboratory examination: Secondary | ICD-10-CM | POA: Insufficient documentation

## 2014-06-19 HISTORY — DX: Cardiac arrhythmia, unspecified: I49.9

## 2014-06-19 HISTORY — DX: Reserved for inherently not codable concepts without codable children: IMO0001

## 2014-06-19 HISTORY — DX: Unspecified osteoarthritis, unspecified site: M19.90

## 2014-06-19 LAB — URINALYSIS, ROUTINE W REFLEX MICROSCOPIC
Bilirubin Urine: NEGATIVE
Glucose, UA: NEGATIVE mg/dL
Hgb urine dipstick: NEGATIVE
Ketones, ur: NEGATIVE mg/dL
Leukocytes, UA: NEGATIVE
Nitrite: NEGATIVE
Protein, ur: NEGATIVE mg/dL
Specific Gravity, Urine: 1.013 (ref 1.005–1.030)
Urobilinogen, UA: 0.2 mg/dL (ref 0.0–1.0)
pH: 7 (ref 5.0–8.0)

## 2014-06-19 LAB — SURGICAL PCR SCREEN
MRSA, PCR: NEGATIVE
Staphylococcus aureus: NEGATIVE

## 2014-06-19 LAB — CBC
HCT: 40.4 % (ref 39.0–52.0)
Hemoglobin: 13.7 g/dL (ref 13.0–17.0)
MCH: 32.5 pg (ref 26.0–34.0)
MCHC: 33.9 g/dL (ref 30.0–36.0)
MCV: 95.7 fL (ref 78.0–100.0)
Platelets: 231 10*3/uL (ref 150–400)
RBC: 4.22 MIL/uL (ref 4.22–5.81)
RDW: 12.5 % (ref 11.5–15.5)
WBC: 5.6 10*3/uL (ref 4.0–10.5)

## 2014-06-19 LAB — BASIC METABOLIC PANEL
Anion gap: 15 (ref 5–15)
BUN: 17 mg/dL (ref 6–23)
CO2: 25 mEq/L (ref 19–32)
Calcium: 9.6 mg/dL (ref 8.4–10.5)
Chloride: 97 mEq/L (ref 96–112)
Creatinine, Ser: 0.8 mg/dL (ref 0.50–1.35)
GFR calc Af Amer: >90 mL/min (ref >90)
GFR calc non Af Amer: >90 mL/min (ref >90)
Glucose, Bld: 125 mg/dL — ABNORMAL HIGH (ref 70–99)
Potassium: 4.5 mEq/L (ref 3.7–5.3)
Sodium: 137 mEq/L (ref 137–147)

## 2014-06-19 LAB — PROTIME-INR
INR: 0.9 (ref 0.00–1.49)
Prothrombin Time: 12.3 seconds (ref 11.6–15.2)

## 2014-06-19 LAB — APTT: aPTT: 26 seconds (ref 24–37)

## 2014-06-19 LAB — ABO/RH: ABO/RH(D): A POS

## 2014-06-19 NOTE — Patient Instructions (Addendum)
YOUR SURGERY IS SCHEDULED AT [facility]  ON:  Friday  December 11th  REPORT TO  SHORT STAY CENTER AT:  11:45 AM   DO NOT EAT ANYTHING AFTER MIDNIGHT THE NIGHT BEFORE YOUR SURGERY.     NO FOOD, NO CHEWING GUM, NO MINTS, NO CANDIES, NO CHEWING TOBACCO. YOU MAY HAVE CLEAR LIQUIDS TO DRINK FROM MIDNIGHT UNTIL 7:45 AM DAY OF SURGERY - LIKE WATER, BLACK COFFEE.    NOTHING TO DRINK AFTER 7:45 AM THE DAY OF YOUR SURGERY.  PLEASE TAKE THE FOLLOWING MEDICATIONS THE AM OF YOUR SURGERY WITH A FEW SIPS OF WATER:  LIPITOR, AND USE YOUR SYMBICORT INHALER.    DO NOT BRING VALUABLES, MONEY, CREDIT CARDS.  DO NOT WEAR JEWELRY, MAKE-UP, NAIL POLISH AND NO METAL PINS OR CLIPS IN YOUR HAIR. CONTACT LENS, DENTURES / PARTIALS, GLASSES SHOULD NOT BE WORN TO SURGERY AND IN MOST CASES-HEARING AIDS WILL NEED TO BE REMOVED.  BRING YOUR GLASSES CASE, ANY EQUIPMENT NEEDED FOR YOUR CONTACT LENS. FOR PATIENTS ADMITTED TO THE HOSPITAL--CHECK OUT TIME THE DAY OF DISCHARGE IS 11:00 AM.  ALL INPATIENT ROOMS ARE PRIVATE - WITH BATHROOM, TELEPHONE, TELEVISION AND WIFI INTERNET.    PLEASE BE AWARE THAT YOU MAY NEED ADDITIONAL BLOOD DRAWN DAY OF YOUR SURGERY  _______________________________________________________________________   [facility] - Preparing for Surgery Before surgery, you can play an important role.  Because skin is not sterile, your skin needs to be as free of germs as possible.  You can reduce the number of germs on your skin by washing with CHG (chlorahexidine gluconate) soap before surgery.  CHG is an antiseptic cleaner which kills germs and bonds with the skin to continue killing germs even after washing. Please DO NOT use if you have an allergy to CHG or antibacterial soaps.  If your skin becomes reddened/irritated stop using the CHG and inform your nurse when you arrive at Short Stay. Do not shave (including legs and underarms) for at least 48 hours prior to the first CHG shower.  You may  shave your face/neck. Please follow these instructions carefully:  1.  Shower with CHG Soap the night before surgery and the  morning of Surgery.  2.  If you choose to wash your hair, wash your hair first as usual with your  normal  shampoo.  3.  After you shampoo, rinse your hair and body thoroughly to remove the  shampoo.                           4.  Use CHG as you would any other liquid soap.  You can apply chg directly  to the skin and wash                       Gently with a scrungie or clean washcloth.  5.  Apply the CHG Soap to your body ONLY FROM THE NECK DOWN.   Do not use on face/ open                           Wound or open sores. Avoid contact with eyes, ears mouth and genitals (private parts).                       Wash face,  Genitals (private parts) with your normal soap.  6.  Wash thoroughly, paying special attention to the area where your surgery  will be performed.  7.  Thoroughly rinse your body with warm water from the neck down.  8.  DO NOT shower/wash with your normal soap after using and rinsing off  the CHG Soap.                9.  Pat yourself dry with a clean towel.            10.  Wear clean pajamas.            11.  Place clean sheets on your bed the night of your first shower and do not  sleep with pets. Day of Surgery : Do not apply any lotions/deodorants the morning of surgery.  Please wear clean clothes to the hospital/surgery center.  FAILURE TO FOLLOW THESE INSTRUCTIONS MAY RESULT IN THE CANCELLATION OF YOUR SURGERY PATIENT SIGNATURE_________________________________  NURSE SIGNATURE__________________________________  ________________________________________________________________________  WHAT IS A BLOOD TRANSFUSION? Blood Transfusion Information  A transfusion is the replacement of blood or some of its parts. Blood is made up of multiple cells which provide different functions.  Red blood cells carry oxygen and are used for blood loss  replacement.  White blood cells fight against infection.  Platelets control bleeding.  Plasma helps clot blood.  Other blood products are available for specialized needs, such as hemophilia or other clotting disorders. BEFORE THE TRANSFUSION  Who gives blood for transfusions?   Healthy volunteers who are fully evaluated to make sure their blood is safe. This is blood bank blood. Transfusion therapy is the safest it has ever been in the practice of medicine. Before blood is taken from a donor, a complete history is taken to make sure that person has no history of diseases nor engages in risky social behavior (examples are intravenous drug use or sexual activity with multiple partners). The donor's travel history is screened to minimize risk of transmitting infections, such as malaria. The donated blood is tested for signs of infectious diseases, such as HIV and hepatitis. The blood is then tested to be sure it is compatible with you in order to minimize the chance of a transfusion reaction. If you or a relative donates blood, this is often done in anticipation of surgery and is not appropriate for emergency situations. It takes many days to process the donated blood. RISKS AND COMPLICATIONS Although transfusion therapy is very safe and saves many lives, the main dangers of transfusion include:   Getting an infectious disease.  Developing a transfusion reaction. This is an allergic reaction to something in the blood you were given. Every precaution is taken to prevent this. The decision to have a blood transfusion has been considered carefully by your caregiver before blood is given. Blood is not given unless the benefits outweigh the risks. AFTER THE TRANSFUSION  Right after receiving a blood transfusion, you will usually feel much better and more energetic. This is especially true if your red blood cells have gotten low (anemic). The transfusion raises the level of the red blood cells which  carry oxygen, and this usually causes an energy increase.  The nurse administering the transfusion will monitor you carefully for complications. HOME CARE INSTRUCTIONS  No special instructions are needed after a transfusion. You may find your energy is better. Speak with your caregiver about any limitations on activity for underlying diseases you may have. SEEK MEDICAL CARE IF:   Your condition is not improving after your  transfusion.  You develop redness or irritation at the intravenous (IV) site. SEEK IMMEDIATE MEDICAL CARE IF:  Any of the following symptoms occur over the next 12 hours:  Shaking chills.  You have a temperature by mouth above 102 F (38.9 C), not controlled by medicine.  Chest, back, or muscle pain.  People around you feel you are not acting correctly or are confused.  Shortness of breath or difficulty breathing.  Dizziness and fainting.  You get a rash or develop hives.  You have a decrease in urine output.  Your urine turns a dark color or changes to pink, red, or brown. Any of the following symptoms occur over the next 10 days:  You have a temperature by mouth above 102 F (38.9 C), not controlled by medicine.  Shortness of breath.  Weakness after normal activity.  The white part of the eye turns yellow (jaundice).  You have a decrease in the amount of urine or are urinating less often.  Your urine turns a dark color or changes to pink, red, or brown. Document Released: 07/01/2000 Document Revised: 09/26/2011 Document Reviewed: 02/18/2008 [facility] Patient Information 2014 [place], [place].  _______________________________________________________________________

## 2014-06-19 NOTE — Pre-Procedure Instructions (Addendum)
EKG AND CXR REPORTS ARE IN EPIC FROM 12-07-13. NUCLEAR STRESS TEST AND ECHO REPORTS ARE IN EPIC. PT'S CHART GIVEN TO FOLLOW UP NURSE WILHEMINA HENDRICK, RN FOR REVIEW OF ALL PREOP TEST RESULTS.

## 2014-06-19 NOTE — Progress Notes (Signed)
   06/19/14 0955  OBSTRUCTIVE SLEEP APNEA  Have you ever been diagnosed with sleep apnea through a sleep study? No  Do you snore loudly (loud enough to be heard through closed doors)?  0  Do you often feel tired, fatigued, or sleepy during the daytime? 0  Has anyone observed you stop breathing during your sleep? 0  Do you have, or are you being treated for high blood pressure? 1  BMI more than 35 kg/m2? 0  Age over 63 years old? 1  Neck circumference greater than 40 cm/16 inches? 1  Gender: 1  Obstructive Sleep Apnea Score 4

## 2014-06-27 ENCOUNTER — Encounter (HOSPITAL_COMMUNITY)
Admission: RE | Disposition: A | Discharge status: Home Health | Payer: Self-pay | Source: Ambulatory Visit | Attending: Orthopaedic Surgery

## 2014-06-27 ENCOUNTER — Inpatient Hospital Stay (HOSPITAL_COMMUNITY)
Admission: RE | Admit: 2014-06-27 | Discharge: 2014-06-29 | DRG: 470 | Disposition: A | Discharge status: Home Health | Payer: BC Managed Care – PPO | Source: Ambulatory Visit | Attending: Orthopaedic Surgery | Admitting: Orthopaedic Surgery

## 2014-06-27 ENCOUNTER — Encounter (HOSPITAL_COMMUNITY): Payer: Self-pay | Admitting: *Deleted

## 2014-06-27 ENCOUNTER — Inpatient Hospital Stay (HOSPITAL_COMMUNITY): Payer: BC Managed Care – PPO | Admitting: Registered Nurse

## 2014-06-27 ENCOUNTER — Inpatient Hospital Stay (HOSPITAL_COMMUNITY): Payer: BC Managed Care – PPO

## 2014-06-27 DIAGNOSIS — Z981 Arthrodesis status: Secondary | ICD-10-CM

## 2014-06-27 DIAGNOSIS — Z01812 Encounter for preprocedural laboratory examination: Secondary | ICD-10-CM | POA: Diagnosis not present

## 2014-06-27 DIAGNOSIS — F17210 Nicotine dependence, cigarettes, uncomplicated: Secondary | ICD-10-CM | POA: Diagnosis present

## 2014-06-27 DIAGNOSIS — I10 Essential (primary) hypertension: Secondary | ICD-10-CM | POA: Diagnosis present

## 2014-06-27 DIAGNOSIS — Z96651 Presence of right artificial knee joint: Secondary | ICD-10-CM

## 2014-06-27 DIAGNOSIS — J449 Chronic obstructive pulmonary disease, unspecified: Secondary | ICD-10-CM | POA: Diagnosis present

## 2014-06-27 DIAGNOSIS — I739 Peripheral vascular disease, unspecified: Secondary | ICD-10-CM | POA: Diagnosis present

## 2014-06-27 DIAGNOSIS — D62 Acute posthemorrhagic anemia: Secondary | ICD-10-CM | POA: Diagnosis not present

## 2014-06-27 DIAGNOSIS — Z6834 Body mass index (BMI) 34.0-34.9, adult: Secondary | ICD-10-CM

## 2014-06-27 DIAGNOSIS — M1711 Unilateral primary osteoarthritis, right knee: Secondary | ICD-10-CM | POA: Diagnosis present

## 2014-06-27 DIAGNOSIS — M25561 Pain in right knee: Secondary | ICD-10-CM | POA: Diagnosis present

## 2014-06-27 DIAGNOSIS — E785 Hyperlipidemia, unspecified: Secondary | ICD-10-CM | POA: Diagnosis present

## 2014-06-27 DIAGNOSIS — E669 Obesity, unspecified: Secondary | ICD-10-CM | POA: Diagnosis present

## 2014-06-27 HISTORY — PX: TOTAL KNEE ARTHROPLASTY: SHX125

## 2014-06-27 LAB — TYPE AND SCREEN
ABO/RH(D): A POS
Antibody Screen: NEGATIVE

## 2014-06-27 SURGERY — ARTHROPLASTY, KNEE, TOTAL
Anesthesia: Spinal | Site: Knee | Laterality: Right

## 2014-06-27 MED ORDER — PROPOFOL 10 MG/ML IV BOLUS
INTRAVENOUS | Status: AC
  Filled 2014-06-27: qty 20

## 2014-06-27 MED ORDER — METOCLOPRAMIDE HCL 10 MG PO TABS: 5.0000 mg | ORAL_TABLET | Freq: Three times a day (TID) | ORAL | Status: DC | PRN

## 2014-06-27 MED ORDER — CEFAZOLIN SODIUM-DEXTROSE 2-3 GM-% IV SOLR
INTRAVENOUS | Status: AC
  Filled 2014-06-27: qty 50

## 2014-06-27 MED ORDER — CEFAZOLIN SODIUM-DEXTROSE 2-3 GM-% IV SOLR
2.0000 g | INTRAVENOUS | Status: AC
  Administered 2014-06-27: 2 g via INTRAVENOUS

## 2014-06-27 MED ORDER — HYDROMORPHONE HCL 1 MG/ML IJ SOLN
1.0000 mg | INTRAMUSCULAR | Status: DC | PRN
  Administered 2014-06-27 (×2): 1 mg via INTRAVENOUS
  Filled 2014-06-27 (×2): qty 1

## 2014-06-27 MED ORDER — BUDESONIDE-FORMOTEROL FUMARATE 80-4.5 MCG/ACT IN AERO
2.0000 | INHALATION_SPRAY | Freq: Every day | RESPIRATORY_TRACT | Status: DC | PRN
  Administered 2014-06-28: 2 via RESPIRATORY_TRACT
  Filled 2014-06-27: qty 6.9

## 2014-06-27 MED ORDER — ACETAMINOPHEN 325 MG PO TABS: 650.0000 mg | ORAL_TABLET | Freq: Four times a day (QID) | ORAL | Status: DC | PRN

## 2014-06-27 MED ORDER — GLYCOPYRROLATE 0.2 MG/ML IJ SOLN
INTRAMUSCULAR | Status: DC | PRN
  Administered 2014-06-27: 0.1 mg via INTRAVENOUS

## 2014-06-27 MED ORDER — DIPHENHYDRAMINE HCL 12.5 MG/5ML PO ELIX
12.5000 mg | ORAL_SOLUTION | ORAL | Status: DC | PRN
Start: 2014-06-27 — End: 2014-06-29

## 2014-06-27 MED ORDER — ASPIRIN EC 81 MG PO TBEC
81.0000 mg | DELAYED_RELEASE_TABLET | Freq: Every day | ORAL | Status: DC
  Administered 2014-06-27 – 2014-06-29 (×3): 81 mg via ORAL
  Filled 2014-06-27 (×3): qty 1

## 2014-06-27 MED ORDER — PHENYLEPHRINE HCL 10 MG/ML IJ SOLN
10.0000 mg | INTRAVENOUS | Status: DC | PRN
  Administered 2014-06-27: 5 ug/min via INTRAVENOUS

## 2014-06-27 MED ORDER — ACETAMINOPHEN 650 MG RE SUPP: 650.0000 mg | Freq: Four times a day (QID) | RECTAL | Status: DC | PRN

## 2014-06-27 MED ORDER — CEFAZOLIN SODIUM 1-5 GM-% IV SOLN
1.0000 g | Freq: Four times a day (QID) | INTRAVENOUS | Status: AC
  Administered 2014-06-27 (×2): 1 g via INTRAVENOUS
  Filled 2014-06-27 (×2): qty 50

## 2014-06-27 MED ORDER — RIVAROXABAN 10 MG PO TABS
10.0000 mg | ORAL_TABLET | Freq: Every day | ORAL | Status: DC
  Administered 2014-06-28 – 2014-06-29 (×2): 10 mg via ORAL
  Filled 2014-06-27 (×3): qty 1

## 2014-06-27 MED ORDER — PROPOFOL 10 MG/ML IV BOLUS
INTRAVENOUS | Status: DC | PRN
  Administered 2014-06-27: 20 mg via INTRAVENOUS

## 2014-06-27 MED ORDER — METOCLOPRAMIDE HCL 5 MG/ML IJ SOLN: 5.0000 mg | Freq: Three times a day (TID) | INTRAMUSCULAR | Status: DC | PRN

## 2014-06-27 MED ORDER — MIDAZOLAM HCL 5 MG/5ML IJ SOLN
INTRAMUSCULAR | Status: DC | PRN
Start: 2014-06-27 — End: 2014-06-27
  Administered 2014-06-27 (×2): 2 mg via INTRAVENOUS

## 2014-06-27 MED ORDER — FENTANYL CITRATE 0.05 MG/ML IJ SOLN
INTRAMUSCULAR | Status: AC
  Filled 2014-06-27: qty 2

## 2014-06-27 MED ORDER — MIDAZOLAM HCL 2 MG/2ML IJ SOLN
INTRAMUSCULAR | Status: AC
  Filled 2014-06-27: qty 2

## 2014-06-27 MED ORDER — OXYCODONE HCL 5 MG PO TABS
5.0000 mg | ORAL_TABLET | ORAL | Status: DC | PRN
  Administered 2014-06-27: 5 mg via ORAL
  Administered 2014-06-27: 15 mg via ORAL
  Administered 2014-06-27 (×2): 10 mg via ORAL
  Administered 2014-06-28 (×7): 15 mg via ORAL
  Administered 2014-06-29: 10 mg via ORAL
  Administered 2014-06-29: 15 mg via ORAL
  Administered 2014-06-29: 10 mg via ORAL
  Filled 2014-06-27 (×3): qty 3
  Filled 2014-06-27: qty 2
  Filled 2014-06-27 (×4): qty 3
  Filled 2014-06-27: qty 2
  Filled 2014-06-27: qty 1
  Filled 2014-06-27: qty 2
  Filled 2014-06-27 (×2): qty 3
  Filled 2014-06-27: qty 2

## 2014-06-27 MED ORDER — ATORVASTATIN CALCIUM 10 MG PO TABS
10.0000 mg | ORAL_TABLET | Freq: Every day | ORAL | Status: DC
  Administered 2014-06-27 – 2014-06-29 (×3): 10 mg via ORAL
  Filled 2014-06-27 (×3): qty 1

## 2014-06-27 MED ORDER — HYDROMORPHONE HCL 1 MG/ML IJ SOLN: 0.2500 mg | INTRAMUSCULAR | Status: DC | PRN

## 2014-06-27 MED ORDER — POLYETHYLENE GLYCOL 3350 17 G PO PACK
17.0000 g | PACK | Freq: Every day | ORAL | Status: DC | PRN
  Administered 2014-06-28: 17 g via ORAL
  Filled 2014-06-27: qty 1

## 2014-06-27 MED ORDER — FENTANYL CITRATE 0.05 MG/ML IJ SOLN
INTRAMUSCULAR | Status: DC | PRN
Start: 2014-06-27 — End: 2014-06-27
  Administered 2014-06-27 (×2): 100 ug via INTRAVENOUS

## 2014-06-27 MED ORDER — PROMETHAZINE HCL 25 MG/ML IJ SOLN: 6.2500 mg | INTRAMUSCULAR | Status: DC | PRN

## 2014-06-27 MED ORDER — METHOCARBAMOL 1000 MG/10ML IJ SOLN
500.0000 mg | Freq: Four times a day (QID) | INTRAVENOUS | Status: DC | PRN
  Administered 2014-06-27 (×2): 500 mg via INTRAVENOUS
  Filled 2014-06-27 (×4): qty 5

## 2014-06-27 MED ORDER — DOCUSATE SODIUM 100 MG PO CAPS
100.0000 mg | ORAL_CAPSULE | Freq: Two times a day (BID) | ORAL | Status: DC
  Administered 2014-06-27 – 2014-06-29 (×4): 100 mg via ORAL

## 2014-06-27 MED ORDER — PROPOFOL INFUSION 10 MG/ML OPTIME
INTRAVENOUS | Status: DC | PRN
  Administered 2014-06-27: 100 ug/kg/min via INTRAVENOUS

## 2014-06-27 MED ORDER — METHOCARBAMOL 500 MG PO TABS
500.0000 mg | ORAL_TABLET | Freq: Four times a day (QID) | ORAL | Status: DC | PRN
  Administered 2014-06-28 – 2014-06-29 (×4): 500 mg via ORAL
  Filled 2014-06-27 (×4): qty 1

## 2014-06-27 MED ORDER — ALUM & MAG HYDROXIDE-SIMETH 200-200-20 MG/5ML PO SUSP: 30.0000 mL | ORAL | Status: DC | PRN

## 2014-06-27 MED ORDER — SODIUM CHLORIDE 0.9 % IR SOLN
Status: DC | PRN
  Administered 2014-06-27: 1000 mL

## 2014-06-27 MED ORDER — LACTATED RINGERS IV SOLN
INTRAVENOUS | Status: DC | PRN
  Administered 2014-06-27 (×3): via INTRAVENOUS

## 2014-06-27 MED ORDER — BUPIVACAINE IN DEXTROSE 0.75-8.25 % IT SOLN
INTRATHECAL | Status: DC | PRN
  Administered 2014-06-27: 2 mL via INTRATHECAL

## 2014-06-27 MED ORDER — DEXAMETHASONE SODIUM PHOSPHATE 10 MG/ML IJ SOLN
INTRAMUSCULAR | Status: DC | PRN
  Administered 2014-06-27: 10 mg via INTRAVENOUS

## 2014-06-27 MED ORDER — PHENOL 1.4 % MT LIQD
1.0000 | OROMUCOSAL | Status: DC | PRN
  Filled 2014-06-27: qty 177

## 2014-06-27 MED ORDER — ONDANSETRON HCL 4 MG/2ML IJ SOLN: 4.0000 mg | Freq: Four times a day (QID) | INTRAMUSCULAR | Status: DC | PRN

## 2014-06-27 MED ORDER — SODIUM CHLORIDE 0.9 % IV SOLN
INTRAVENOUS | Status: DC
  Administered 2014-06-27 – 2014-06-28 (×2): via INTRAVENOUS

## 2014-06-27 MED ORDER — MENTHOL 3 MG MT LOZG
1.0000 | LOZENGE | OROMUCOSAL | Status: DC | PRN
  Filled 2014-06-27: qty 9

## 2014-06-27 MED ORDER — ONDANSETRON HCL 4 MG PO TABS: 4.0000 mg | ORAL_TABLET | Freq: Four times a day (QID) | ORAL | Status: DC | PRN

## 2014-06-27 MED ORDER — PHENYLEPHRINE HCL 10 MG/ML IJ SOLN
INTRAMUSCULAR | Status: AC
  Filled 2014-06-27: qty 1

## 2014-06-27 SURGICAL SUPPLY — 56 items
BAG ZIPLOCK 12X15 (MISCELLANEOUS) IMPLANT
BANDAGE ELASTIC 6 VELCRO ST LF (GAUZE/BANDAGES/DRESSINGS) ×2 IMPLANT
BANDAGE ESMARK 6X9 LF (GAUZE/BANDAGES/DRESSINGS) ×1 IMPLANT
BENZOIN TINCTURE PRP APPL 2/3 (GAUZE/BANDAGES/DRESSINGS) ×2 IMPLANT
BLADE SAG 13.0X1.37X90 (BLADE) IMPLANT
BLADE SAG 18X100X1.27 (BLADE) ×2 IMPLANT
BLADE SAGITTAL 25.0X1.37X90 (BLADE) IMPLANT
BNDG ESMARK 6X9 LF (GAUZE/BANDAGES/DRESSINGS) ×2
BOWL SMART MIX CTS (DISPOSABLE) ×2 IMPLANT
CAPT KNEE TOTAL 3 ×2 IMPLANT
CEMENT BONE 1-PACK (Cement) ×4 IMPLANT
CUFF TOURN SGL QUICK 34 (TOURNIQUET CUFF) ×1
CUFF TRNQT CYL 34X4X40X1 (TOURNIQUET CUFF) ×1 IMPLANT
DRAPE EXTREMITY T 121X128X90 (DRAPE) ×2 IMPLANT
DRAPE POUCH INSTRU U-SHP 10X18 (DRAPES) ×2 IMPLANT
DRAPE SHEET LG 3/4 BI-LAMINATE (DRAPES) IMPLANT
DRAPE U-SHAPE 47X51 STRL (DRAPES) ×2 IMPLANT
DRSG PAD ABDOMINAL 8X10 ST (GAUZE/BANDAGES/DRESSINGS) IMPLANT
DURAPREP 26ML APPLICATOR (WOUND CARE) ×2 IMPLANT
ELECT REM PT RETURN 9FT ADLT (ELECTROSURGICAL) ×2
ELECTRODE REM PT RTRN 9FT ADLT (ELECTROSURGICAL) ×1 IMPLANT
FACESHIELD WRAPAROUND (MASK) ×10 IMPLANT
GAUZE SPONGE 4X4 12PLY STRL (GAUZE/BANDAGES/DRESSINGS) ×2 IMPLANT
GAUZE XEROFORM 1X8 LF (GAUZE/BANDAGES/DRESSINGS) IMPLANT
GLOVE BIO SURGEON STRL SZ7.5 (GLOVE) ×2 IMPLANT
GLOVE BIOGEL PI IND STRL 8 (GLOVE) ×2 IMPLANT
GLOVE BIOGEL PI INDICATOR 8 (GLOVE) ×2
GLOVE ECLIPSE 8.0 STRL XLNG CF (GLOVE) ×2 IMPLANT
GOWN STRL REUS W/TWL XL LVL3 (GOWN DISPOSABLE) ×4 IMPLANT
HANDPIECE INTERPULSE COAX TIP (DISPOSABLE) ×1
IMMOBILIZER KNEE 20 (SOFTGOODS) ×2 IMPLANT
IMMOBILIZER KNEE 20 THIGH 36 (SOFTGOODS) IMPLANT
KIT BASIN OR (CUSTOM PROCEDURE TRAY) ×2 IMPLANT
NS IRRIG 1000ML POUR BTL (IV SOLUTION) ×2 IMPLANT
PACK TOTAL JOINT (CUSTOM PROCEDURE TRAY) ×2 IMPLANT
PAD ABD 8X10 STRL (GAUZE/BANDAGES/DRESSINGS) ×2 IMPLANT
PADDING CAST COTTON 6X4 STRL (CAST SUPPLIES) ×2 IMPLANT
POSITIONER SURGICAL ARM (MISCELLANEOUS) ×2 IMPLANT
SET HNDPC FAN SPRY TIP SCT (DISPOSABLE) ×1 IMPLANT
SET PAD KNEE POSITIONER (MISCELLANEOUS) ×2 IMPLANT
STAPLER VISISTAT 35W (STAPLE) IMPLANT
STRIP CLOSURE SKIN 1/2X4 (GAUZE/BANDAGES/DRESSINGS) ×2 IMPLANT
SUCTION FRAZIER 12FR DISP (SUCTIONS) ×2 IMPLANT
SUT MNCRL AB 4-0 PS2 18 (SUTURE) IMPLANT
SUT VIC AB 0 CT1 27 (SUTURE) ×1
SUT VIC AB 0 CT1 27XBRD ANTBC (SUTURE) ×1 IMPLANT
SUT VIC AB 1 CT1 27 (SUTURE) ×2
SUT VIC AB 1 CT1 27XBRD ANTBC (SUTURE) ×2 IMPLANT
SUT VIC AB 2-0 CT1 27 (SUTURE) ×2
SUT VIC AB 2-0 CT1 TAPERPNT 27 (SUTURE) ×2 IMPLANT
TOWEL OR 17X26 10 PK STRL BLUE (TOWEL DISPOSABLE) ×2 IMPLANT
TOWEL OR NON WOVEN STRL DISP B (DISPOSABLE) IMPLANT
TRAY FOLEY CATH 14FRSI W/METER (CATHETERS) IMPLANT
TRAY FOLEY CATH 16FRSI W/METER (SET/KITS/TRAYS/PACK) ×2 IMPLANT
WATER STERILE IRR 1500ML POUR (IV SOLUTION) ×2 IMPLANT
WRAP KNEE MAXI GEL POST OP (GAUZE/BANDAGES/DRESSINGS) ×2 IMPLANT

## 2014-06-27 NOTE — Transfer of Care (Deleted)
Immediate Anesthesia Transfer of Care Note  Patient: [name]  Procedure(s) Performed: Procedure(s) (LRB): RIGHT TOTAL KNEE ARTHROPLASTY (Right)  Patient Location: PACU  Anesthesia Type: General  Level of Consciousness: sedated, patient cooperative and responds to stimulation  Airway & Oxygen Therapy: Patient Spontanous Breathing and Patient connected to face mask oxgen  Post-op Assessment: Report given to PACU RN and Post -op Vital signs reviewed and stable  Post vital signs: Reviewed and stable  Complications: No apparent anesthesia complications

## 2014-06-27 NOTE — Anesthesia Procedure Notes (Signed)
Spinal  End time: 06/27/2014 12:15 PM Staffing Resident/CRNA: Enrigue Catena E Preanesthetic Checklist Completed: patient identified, site marked, surgical consent, pre-op evaluation, timeout performed, IV checked, risks and benefits discussed and monitors and equipment checked Spinal Block Patient position: sitting Prep: Betadine Patient monitoring: heart rate, continuous pulse ox and blood pressure Approach: right paramedian Location: L2-3 Injection technique: single-shot Needle Needle type: Sprotte  Needle gauge: 22 G Assessment Sensory level: T4 Additional Notes Pt tolerated procedure well.  CSF x 3. Negative heme or paresthesia. Spinal kit  And drugs within date.

## 2014-06-27 NOTE — H&P (Signed)
TOTAL KNEE ADMISSION H&P  Patient is being admitted for right total knee arthroplasty.  Subjective:  Chief Complaint:right knee pain.  HPI: [name], 63 y.o. male, has a history of pain and functional disability in the right knee due to primary osteoarthritis and has failed non-surgical conservative treatments for greater than 12 weeks to includeNSAID's and/or analgesics, corticosteriod injections, viscosupplementation injections, flexibility and strengthening excercises and activity modification.  Onset of symptoms was gradual, starting 5 years ago with gradually worsening course since that time. The patient noted prior procedures on the knee to include  arthroscopy on the right knee(s).  Patient currently rates pain in the right knee(s) at 9 out of 10 with activity. Patient has night pain, worsening of pain with activity and weight bearing, pain that interferes with activities of daily living, pain with passive range of motion, crepitus and joint swelling.  Patient has evidence of subchondral sclerosis, periarticular osteophytes and joint space narrowing by imaging studies. There is no active infection.  Patient Active Problem List   Diagnosis Date Noted  . Primary osteoarthritis of right knee 06/27/2014  . Bilateral serous otitis media 04/18/2014  . Syncope 12/08/2013  . Hyponatremia 12/08/2013  . Bilateral leg cramps 12/08/2013  . History of nocturia 12/08/2013  . Alcohol abuse 12/08/2013  . Chest pain 01/14/2013  . Health maintenance examination 08/25/2011  . Smoker   . VITAMIN D DEFICIENCY 08/26/2010  . OBESITY 08/26/2010  . HYPERTROPHY PROSTATE W/UR OBST & OTH LUTS 08/26/2010  . ONYCHOMYCOSIS, BILATERAL 02/08/2010  . PREMATURE VENTRICULAR CONTRACTIONS 11/17/2009  . PVD 11/06/2008  . SNORING 11/06/2008  . HYPERLIPIDEMIA 07/31/2007  . HYPERTENSION 07/31/2007  . ALLERGIC RHINITIS 07/31/2007  . COPD 07/31/2007  . 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  HYPERGLYCEMIA 07/31/2007  . HEPATITIS C, HX OF 07/31/2007   Past Medical History  Diagnosis Date  . Allergic rhinitis 07/1998  . COPD (chronic obstructive pulmonary disease) 07/1998    w ongoing tobacco use, spirometry (02/2013)  . Hyperlipidemia 06/1996  . HTN (hypertension) 05/1998  . ETOH abuse   . Smoker   . Hepatitis     pos test once not now  . Lumbar spinal stenosis 03/2013    s/p laminectomy  . Dysrhythmia     PVC'S  . Shortness of breath dyspnea     WITH EXERTION  . 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 Arthritis     BACK AND RIGHT KNEE    Past Surgical History  Procedure Laterality Date  . Back surgery  2007  . Neck fusion  2003  . Colonoscopy  09/2010    polyps, diverticula, hemorrhoids.  Rpt 5 yrs  . Ett  2010    normal, ABIs normal  . US echocardiography  01/2013    WNL, mild diastolic dysfunction  . Spriometry  02/2013    COPD with some restriction thought to be obesity related ([place])  . Shoulder arthroscopy Right 11  . Knee arthroscopy Right 11    cartlage, ligaments  . Knee arthroscopy Left 84  . Lumbar laminectomy/decompression microdiscectomy Left 03/27/2013    Procedure: LUMBAR LAMINECTOMY/DECOMPRESSION MICRODISCECTOMY 2 LEVELS;  Surgeon: Ophelia Charter, MD;  Location: [facility] [place] [facility] [place] [facility];  Service: Neurosurgery;  Laterality: Left;  LEFT Lumbar Three Four diskectomy with Lumbar Three-Four, Four-Five Laminectomy. L3/4 ahd L4/5 for LSS [name])     No prescriptions prior to admission   No Known Allergies  History  Substance Use Topics  .  Smoking status: Current Every Day Smoker -- 0.50 packs/day for 40 years    Types: Cigarettes  . Smokeless tobacco: Never Used     Comment: 1 ppd X 30 years  . Alcohol Use: 33.6 oz/week    56 Cans of beer per week     Comment: 8 beers/day    Family History  Problem Relation Age of Onset  . Coronary artery disease Mother   . Hypertension Mother   . Stroke Mother   . Coronary artery disease Father   .  Hypertension Father   . Diabetes Father   . Stroke Father   . Pneumonia Sister   . Irregular heart beat Brother   . Maple syrup urine disease Brother   . Cancer Neg Hx      Review of Systems  Musculoskeletal: Positive for joint pain.  All other systems reviewed and are negative.   Objective:  Physical Exam  Constitutional: He is oriented to person, place, and time. He appears well-developed and well-nourished.  HENT:  Head: Atraumatic.  Eyes: EOM are normal. Pupils are equal, round, and reactive to light.  Neck: Normal range of motion. Neck supple.  Cardiovascular: Normal rate and regular rhythm.   Respiratory: Effort normal and breath sounds normal.  GI: Soft. Bowel sounds are normal.  Musculoskeletal:       Right knee: He exhibits decreased range of motion, effusion and abnormal alignment. Tenderness found. Medial joint line tenderness noted.  Neurological: He is alert and oriented to person, place, and time.  Skin: Skin is warm and dry.  Psychiatric: He has a normal mood and affect.    Vital signs in last 24 hours:    Labs:   Estimated body mass index is 34.49 kg/(m^2) as calculated from the following:   Height as of 12/08/13: 5\' 7"  (1.702 m).   Weight as of 04/18/14: 99.905 kg (220 lb 4 oz).   Imaging Review Plain radiographs demonstrate severe degenerative joint disease of the right knee(s). The overall alignment ismild varus. The bone quality appears to be good for age and reported activity level.  Assessment/Plan:  End stage arthritis, right knee   The patient history, physical examination, clinical judgment of the provider and imaging studies are consistent with end stage degenerative joint disease of the right knee(s) and total knee arthroplasty is deemed medically necessary. The treatment options including medical management, injection therapy arthroscopy and arthroplasty were discussed at length. The risks and benefits of total knee arthroplasty were  presented and reviewed. The risks due to aseptic loosening, infection, stiffness, patella tracking problems, thromboembolic complications and other imponderables were discussed. The patient acknowledged the explanation, agreed to proceed with the plan and consent was signed. Patient is being admitted for inpatient treatment for surgery, pain control, PT, OT, prophylactic antibiotics, VTE prophylaxis, progressive ambulation and ADL's and discharge planning. The patient is planning to be discharged home with home health services

## 2014-06-27 NOTE — Transfer of Care (Addendum)
Immediate Anesthesia Transfer of Care Note  Patient: [name]  Procedure(s) Performed: Procedure(s): RIGHT TOTAL KNEE ARTHROPLASTY (Right)  Patient Location: PACU  Anesthesia Type:Spinal  Level of Consciousness: awake, alert , oriented and patient cooperative  Airway & Oxygen Therapy: Patient Spontanous Breathing and Patient connected to face mask oxygen  Post-op Assessment: Report given to PACU RN and Post -op Vital signs reviewed and stable  Post vital signs: stable  Complications: No apparent anesthesia complications  L2 spinal level

## 2014-06-27 NOTE — Anesthesia Preprocedure Evaluation (Addendum)
Anesthesia Evaluation  Patient identified by MRN, date of birth, ID band Patient awake    Reviewed: Allergy & Precautions, H&P , NPO status , Patient's Chart, lab work & pertinent test results  Airway Mallampati: II  TM Distance: >3 FB Neck ROM: Full    Dental no notable dental hx.    Pulmonary shortness of breath, COPD COPD inhaler, Current Smoker,  breath sounds clear to auscultation  Pulmonary exam normal       Cardiovascular Exercise Tolerance: Good hypertension, Pt. on medications + Peripheral Vascular Disease + dysrhythmias Rhythm:Regular Rate:Normal     Neuro/Psych negative neurological ROS  negative psych ROS   GI/Hepatic negative GI ROS, (+)     substance abuse  alcohol use, Hepatitis -  Endo/Other  negative endocrine ROS  Renal/GU negative Renal ROS  negative genitourinary   Musculoskeletal  (+) Arthritis -, Osteoarthritis,    Abdominal (+) + obese,   Peds negative pediatric ROS (+)  Hematology negative hematology ROS (+)   Anesthesia Other Findings   Reproductive/Obstetrics negative OB ROS                           Anesthesia Physical Anesthesia Plan  ASA: III  Anesthesia Plan: Spinal   Post-op Pain Management:    Induction: Intravenous  Airway Management Planned:   Additional Equipment:   Intra-op Plan:   Post-operative Plan:   Informed Consent: I have reviewed the patients History and Physical, chart, labs and discussed the procedure including the risks, benefits and alternatives for the proposed anesthesia with the patient or authorized representative who has indicated his/her understanding and acceptance.   Dental advisory given  Plan Discussed with: CRNA  Anesthesia Plan Comments: (Discussed spinal and general. Discussed risks/benefits of spinal including headache, backache, failure, bleeding, infection, and nerve damage. Patient consents to spinal.  Questions answered. Coagulation studies and platelet count acceptable.)        Anesthesia Quick Evaluation

## 2014-06-27 NOTE — Anesthesia Postprocedure Evaluation (Signed)
  Anesthesia Post-op Note  Patient: [name]  Procedure(s) Performed: Procedure(s) (LRB): RIGHT TOTAL KNEE ARTHROPLASTY (Right)  Patient Location: PACU  Anesthesia Type: Spinal  Level of Consciousness: awake and alert   Airway and Oxygen Therapy: Patient Spontanous Breathing  Post-op Pain: mild  Post-op Assessment: Post-op Vital signs reviewed, Patient's Cardiovascular Status Stable, Respiratory Function Stable, Patent Airway and No signs of Nausea or vomiting  Last Vitals:  Filed Vitals:   06/27/14 1705  BP: 152/75  Pulse: 64  Temp: 36.7 C  Resp: 14    Post-op Vital Signs: stable   Complications: No apparent anesthesia complications

## 2014-06-27 NOTE — Brief Op Note (Signed)
06/27/2014  2:36 PM  PATIENT:  [name]  63 y.o. male  PRE-OPERATIVE DIAGNOSIS:  Osteoarthritis right knee  POST-OPERATIVE DIAGNOSIS:  Osteoarthritis right knee  PROCEDURE:  Procedure(s): RIGHT TOTAL KNEE ARTHROPLASTY (Right)  SURGEON:  Surgeon(s) and Role:    * Mcarthur Rossetti, MD - Primary  ANESTHESIA:   spinal  EBL:  Total I/O In: 2000 [I.V.:2000] Out: 450 [Urine:250; Blood:200]  BLOOD ADMINISTERED:none  DRAINS: none   LOCAL MEDICATIONS USED:  NONE  SPECIMEN:  No Specimen  DISPOSITION OF SPECIMEN:  N/A  COUNTS:  YES  TOURNIQUET:   Total Tourniquet Time Documented: Thigh (Right) - 78 minutes Total: Thigh (Right) - 78 minutes   DICTATION: .Other Dictation: Dictation Number [id number]  PLAN OF CARE: Admit to inpatient   PATIENT DISPOSITION:  PACU - hemodynamically stable.   Delay start of Pharmacological VTE agent (>24hrs) due to surgical blood loss or risk of bleeding: no

## 2014-06-28 LAB — BASIC METABOLIC PANEL
Anion gap: 12 (ref 5–15)
BUN: 16 mg/dL (ref 6–23)
CO2: 23 mEq/L (ref 19–32)
Calcium: 8.7 mg/dL (ref 8.4–10.5)
Chloride: 95 mEq/L — ABNORMAL LOW (ref 96–112)
Creatinine, Ser: 0.9 mg/dL (ref 0.50–1.35)
GFR calc Af Amer: >90 mL/min (ref >90)
GFR calc non Af Amer: 89 mL/min — ABNORMAL LOW (ref >90)
Glucose, Bld: 157 mg/dL — ABNORMAL HIGH (ref 70–99)
Potassium: 4.5 mEq/L (ref 3.7–5.3)
Sodium: 130 mEq/L — ABNORMAL LOW (ref 137–147)

## 2014-06-28 LAB — CBC
HCT: 31.4 % — ABNORMAL LOW (ref 39.0–52.0)
Hemoglobin: 10.5 g/dL — ABNORMAL LOW (ref 13.0–17.0)
MCH: 32.3 pg (ref 26.0–34.0)
MCHC: 33.4 g/dL (ref 30.0–36.0)
MCV: 96.6 fL (ref 78.0–100.0)
Platelets: 217 10*3/uL (ref 150–400)
RBC: 3.25 MIL/uL — ABNORMAL LOW (ref 4.22–5.81)
RDW: 12.3 % (ref 11.5–15.5)
WBC: 12.9 10*3/uL — ABNORMAL HIGH (ref 4.0–10.5)

## 2014-06-28 MED ORDER — DSS 100 MG PO CAPS: 100.0000 mg | ORAL_CAPSULE | Freq: Two times a day (BID) | ORAL | Status: DC

## 2014-06-28 MED ORDER — METHOCARBAMOL 500 MG PO TABS: 500.0000 mg | ORAL_TABLET | Freq: Four times a day (QID) | ORAL | Status: DC | PRN

## 2014-06-28 MED ORDER — OXYCODONE-ACETAMINOPHEN 5-325 MG PO TABS: 1.0000 | ORAL_TABLET | ORAL | Status: DC | PRN

## 2014-06-28 MED ORDER — RIVAROXABAN 10 MG PO TABS: 10.0000 mg | ORAL_TABLET | Freq: Every day | ORAL | Status: DC

## 2014-06-28 MED ORDER — LISINOPRIL 10 MG PO TABS
10.0000 mg | ORAL_TABLET | Freq: Every day | ORAL | Status: DC
  Administered 2014-06-28 – 2014-06-29 (×2): 10 mg via ORAL
  Filled 2014-06-28 (×2): qty 1

## 2014-06-28 NOTE — Op Note (Signed)
[name], [name] NO.:  [id number]  MEDICAL RECORD NO.:  [id number]  LOCATION:  12                         FACILITY:  [facility]  PHYSICIAN:  Lind Guest. Ninfa Linden, M.D.DATE OF BIRTH:  [birth year]  DATE OF PROCEDURE:  06/27/2014 DATE OF DISCHARGE:                              OPERATIVE REPORT   PREOPERATIVE DIAGNOSES:  Primary osteoarthritis and degenerative joint disease, right knee.  POSTOPERATIVE DIAGNOSES:  Primary osteoarthritis and degenerative joint disease, right knee.  PROCEDURE:  Right total knee arthroplasty.  IMPLANTS:  Stryker triathlon knee with size 5 femur, size 5 tibial tray, 9 mm polyethylene insert, size 35 patella button.  SURGEON:  Lind Guest. Ninfa Linden, M.D.  ANESTHESIA:  Spinal.  TOURNIQUET TIME:  Less than 2 hours.  ANTIBIOTICS:  2 g IV Ancef.  BLOOD LOSS:  Less than 200 mL.  COMPLICATIONS:  None.  INDICATIONS:  Mr. Gentile is well-known to me, 63 year old gentleman with primary osteoarthritis involving his right knee.  I have seen him for many years now and he has had all forms of conservative treatment including arthroscopic intervention, steroid injections, supplement injections, anti-inflammatories, rest, ice, heat and time.  He has work on Forensic scientist exercises as well.  It has gotten to where this has affected his mobility.  His pain is daily.  It has affected his quality of life and his activities of daily living.  At this point, he wished to proceed with a total knee arthroplasty.  He understands the risks of acute blood anemia, nerve and vessel injury, fracture, infection, and DVT.  He understands the goals were decreased pain, improved mobility, and hopefully overall improved quality of life.  PROCEDURE DESCRIPTION:  After informed consent was obtained, appropriate right knee was marked.  He was brought to the operating room and spinal anesthesia was obtained, while he was on the OR table.  He was then  laid in a supine position.  A Foley catheter was placed and then his nonsterile tourniquet was placed around his right upper thigh.  His right leg was then prepped and draped with DuraPrep and sterile drapes. A time-out was called to identify correct patient, correct right knee. We then used an Esmarch to wrap out the leg and the tourniquet was inflated to 300 mm of pressure.  I made a midline incision over the patella and carried this proximally and distally.  I dissected down to the knee joint itself and performed a medial parapatellar arthrotomy, but I did this.  This was a modified quad sparing approach.  I kept my incision just at the level of the vastus medialis and below this.  I found a large joint effusion and significant osteophytes throughout the knee.  I removed remnants of the ACL, PCL and medial and lateral meniscus.  Then, I made my patellar cut first to facilitate this more minimal approach.  I was able to take 10 mm off the undersurface of the patella and drilled lugs for a size 35 patellar button, did a lateral release as well.  I then had the knee in a flexed position using the extramedullary tibial cutting guide.  I set this for taking 9 mm off the high side  or neutral slope and correcting our varus or valgus.  I made this cut without difficulty.  I then used an intramedullary guide for the femur and was able to place my distal femoral cutting block on based on 5 degrees right externally rotated.  I would take a 10 mm cut off the distal femur.  I brought the knee back down to extension and took 2 more mm off the tibia with the extension block and then get him in full extended position.  Again, released the ACL, PCL, medial and lateral meniscus as well.  I then went back to the femur and put my femoral sizing guide on 3 degrees right.  I chose a size 5 femur.  I then placed my 4 in 1 cutting block for a size 5 femur, made my anterior posterior cuts followed by my  chamfer cuts.  I then made my femoral box cut. Attention was then turned back to the tibia.  I trialed for a size 5 tibia and made my keel punch over this with a 5 femur in place and the 5 tibia.  I trialed a 9 mm polyethylene insert and a size 35 patellar button, and was pleased with the alignment and range of motion.  I was also pleased with the stability.  I then removed all trial components and clean debris from the knee.  I copiously irrigated the knee with normal saline solution using pulsatile lavage.  I then mixed our cement and cemented the real Stryker Triathlon tibial tray, size 5 followed by the real size 5 femur.  I cleaned the cement debris from the knee and placed the real fix bearing 9 mm thickness polyethylene insert.  I then cemented the size 35 patellar button.  I then irrigated the knee again with normal saline solution using pulsatile lavage.  Once the cement had hardened, we let the tourniquet down and hemostasis was obtained with electrocautery.  I then closed the arthrotomy with interrupted #1 Vicryl suture followed by 0 Vicryl in the deep tissue, 2-0 Vicryl in subcutaneous tissue, 4-0 Monocryl subcuticular stitch and Steri-Strips on the skin.  Well-padded sterile dressing was applied and he was taken to the recovery room in stable condition.  All final counts were correct.  There were no complications noted.     Lind Guest. Ninfa Linden, M.D.     CYB/MEDQ  D:  06/27/2014  T:  06/28/2014  Job:  [id number]

## 2014-06-28 NOTE — Progress Notes (Signed)
Physical Therapy Treatment Patient Details Name: TEDRICK PORT MRN: [id number] DOB: 05-03-1951 Today's Date: 06/28/2014    History of Present Illness 63 yo male s/p R TKA 06/27/14.     PT Comments    Progressing well with mobility. Increased soreness this afternoon. Plan is for d/c home on tomorrow. Needs to practice steps prior to d/c.   Follow Up Recommendations  Home health PT     Equipment Recommendations  Rolling walker with 5" wheels    Recommendations for Other Services       Precautions / Restrictions Precautions Precautions: Knee;Fall Required Braces or Orthoses: Knee Immobilizer - Right Knee Immobilizer - Right: Discontinue once straight leg raise with < 10 degree lag Restrictions Weight Bearing Restrictions: No Other Position/Activity Restrictions: WBAT    Mobility  Bed Mobility Overal bed mobility: Needs Assistance Bed Mobility: Supine to Sit     Supine to sit: Min guard Sit to supine: Min assist   General bed mobility comments: Assist for LE   Transfers Overall transfer level: Needs assistance Equipment used: Rolling walker (2 wheeled) Transfers: Sit to/from Stand Sit to Stand: Min guard         General transfer comment: clsoe guard for safety. VCS safety, hand placement  Ambulation/Gait Ambulation/Gait assistance: Min guard Ambulation Distance (Feet): 125 Feet Assistive device: Rolling walker (2 wheeled) Gait Pattern/deviations: Step-through pattern;Antalgic     General Gait Details: close guard for safety.    Stairs            Wheelchair Mobility    Modified Rankin (Stroke Patients Only)       Balance                                    Cognition Arousal/Alertness: Awake/alert Behavior During Therapy: WFL for tasks assessed/performed Overall Cognitive Status: Within Functional Limits for tasks assessed                      Exercises      General Comments        Pertinent  Vitals/Pain Pain Assessment: 0-10 Pain Score: 6  Pain Location: R knee Pain Descriptors / Indicators: Aching;Sore Pain Intervention(s): Monitored during session;Ice applied    Home Living                      Prior Function            PT Goals (current goals can now be found in the care plan section) Progress towards PT goals: Progressing toward goals    Frequency  7X/week    PT Plan Current plan remains appropriate    Co-evaluation             End of Session   Activity Tolerance: Patient tolerated treatment well Patient left: in bed;with call bell/phone within reach     Time: 1511-1532 PT Time Calculation (min) (ACUTE ONLY): 21 min  Charges:  $Gait Training: 8-22 mins                    G Codes:      Weston Anna, MPT Pager: [phone]

## 2014-06-28 NOTE — Progress Notes (Signed)
CARE MANAGEMENT NOTE 06/28/2014  Patient:  [name], [name]   Account Number:  [id number]  Date Initiated:  06/28/2014  Documentation initiated by:  [facility]  Subjective/Objective Assessment:   Right total knee arthroplasty     Action/Plan:   HH, lives at home with wife   Anticipated DC Date:  06/29/2014   Anticipated DC Plan:  [place]  CM consult      [facility] Choice  HOME HEALTH   Choice offered to / List presented to:  C-1 Patient   DME arranged  Vassie Moselle      DME agency  [place] arranged  HH-2 PT      [place]   Status of service:  Completed, signed off Medicare Important Message given?   (If response is "NO", the following Medicare IM given date fields will be blank) Date Medicare IM given:   Medicare IM given by:   Date Additional Medicare IM given:   Additional Medicare IM given by:    Discharge Disposition:  [place]  Per UR Regulation:    If discussed at Long Length of Stay Meetings, dates discussed:    Comments:  06/28/2014 0900 NCM spoke to pt and offered choice for [facility]. Pt agreeable to [facility] for [facility]. Pt requesting RW for home. Will notify [facility] for RW for home. Jonnie Finner RN CCM

## 2014-06-28 NOTE — Progress Notes (Signed)
Occupational Therapy Evaluation Patient Details Name: [name] MRN: [id number] DOB: 1951-03-13 Today's Date: 06/28/2014    History of Present Illness s/p R TKR   Clinical Impression   Patient presents to OT with decreased ADL independence after above procedure. He is progressing well and has good family support. Will follow to maximize independence and facilitate safe discharge plan.    Follow Up Recommendations  No OT follow up;Supervision - Intermittent    Equipment Recommendations  3 in 1 bedside comode    Recommendations for Other Services       Precautions / Restrictions Precautions Precautions: Fall;Knee Required Braces or Orthoses: Knee Immobilizer - Right Restrictions Weight Bearing Restrictions: No Other Position/Activity Restrictions: WBAT      Mobility Bed Mobility Overal bed mobility: Needs Assistance Bed Mobility: Sit to Supine       Sit to supine: Supervision      Transfers Overall transfer level: Needs assistance Equipment used: Rolling walker (2 wheeled) Transfers: Sit to/from Stand Sit to Stand: Min guard              Balance                                            ADL Overall ADL's : Needs assistance/impaired Eating/Feeding: Independent   Grooming: Wash/dry hands;Wash/dry face;Independent   Upper Body Bathing: Set up;Sitting   Lower Body Bathing: Minimal assistance;Sit to/from stand   Upper Body Dressing : Set up;Sitting   Lower Body Dressing: Moderate assistance;Sit to/from stand   Toilet Transfer: Min guard;BSC;RW;Ambulation   Toileting- Water quality scientist and Hygiene: Min guard;Sit to/from stand       Functional mobility during ADLs: Min guard;Rolling walker General ADL Comments: Patient educated on role of OT. Began education of LB dressing techniques of donning operated leg first in pants and underwear. Also discussed footwear, that flip flops not safe and supportive footwear at this  time. Patient and family verbalize understanding. Patient stated his family could assist with getting dressed as needed. Walk-in shower at home has a 3 inch ledge. Will practice next session.     Vision                     Perception     Praxis      Pertinent Vitals/Pain Pain Assessment: 0-10 Pain Score: 3  Pain Location: R knee Pain Descriptors / Indicators: Aching;Sore Pain Intervention(s): Limited activity within patient's tolerance     Hand Dominance Right   Extremity/Trunk Assessment Upper Extremity Assessment Upper Extremity Assessment: Overall WFL for tasks assessed   Lower Extremity Assessment Lower Extremity Assessment: Defer to PT evaluation       Communication Communication Communication: No difficulties   Cognition Arousal/Alertness: Awake/alert Behavior During Therapy: WFL for tasks assessed/performed Overall Cognitive Status: Within Functional Limits for tasks assessed                     General Comments       Exercises       Shoulder Instructions      Home Living Family/patient expects to be discharged to:: Private residence Living Arrangements: Spouse/significant other Available Help at Discharge: Family Type of Home: House Home Access: Stairs to enter [employer] of Steps: 2 front Entrance Stairs-Rails: None Home Layout: One level     Bathroom Shower/Tub: Occupational psychologist:  Standard Bathroom Accessibility: Yes How Accessible: Accessible via walker Home Equipment: [place] - single point;Crutches          Prior Functioning/Environment Level of Independence: Independent             OT Diagnosis: Acute pain   OT Problem List: Decreased strength;Decreased range of motion;Decreased knowledge of use of DME or AE;Pain   OT Treatment/Interventions: Self-care/ADL training;DME and/or AE instruction;Therapeutic activities;Patient/family education    OT Goals(Current goals can be found in the  care plan section) Acute Rehab OT Goals Patient Stated Goal: to go home Sunday OT Goal Formulation: With patient Time For Goal Achievement: 07/05/14 Potential to Achieve Goals: Good  OT Frequency: Min 2X/week   Barriers to D/C:            Co-evaluation              End of Session Equipment Utilized During Treatment: Rolling walker;Right knee immobilizer  Activity Tolerance: Patient tolerated treatment well Patient left: in bed;with call bell/phone within reach;with family/visitor present   Time: 1114-1130 OT Time Calculation (min): 16 min Charges:  OT General Charges $OT Visit: 1 Procedure OT Evaluation $Initial OT Evaluation Tier I: 1 Procedure OT Treatments $Self Care/Home Management : 8-22 mins G-Codes:    Kalman Nylen A 07-07-2014, 11:54 AM

## 2014-06-28 NOTE — Evaluation (Signed)
Physical Therapy Evaluation Patient Details Name: [name] MRN: [id number] DOB: 11/05/1950 Today's Date: 06/28/2014   History of Present Illness  63 yo male s/p R TKA 06/27/14.   Clinical Impression  On eval, pt was Min guard assist for standing and ambulation-able to walk ~115 feet with rolling walker. Tolerated activity well. Plan is for d/c home on tomorrow.     Follow Up Recommendations Home health PT    Equipment Recommendations  Rolling walker with 5" wheels    Recommendations for Other Services OT consult     Precautions / Restrictions Precautions Precautions: Knee;Fall Required Braces or Orthoses: Knee Immobilizer - Right Knee Immobilizer - Right: Discontinue once straight leg raise with < 10 degree lag Restrictions Weight Bearing Restrictions: No Other Position/Activity Restrictions: WBAT      Mobility  Bed Mobility Overal bed mobility: Needs Assistance Bed Mobility: Sit to Supine       Sit to supine: Supervision   General bed mobility comments: pt oob in chair  Transfers Overall transfer level: Needs assistance Equipment used: Rolling walker (2 wheeled) Transfers: Sit to/from Stand Sit to Stand: Min guard         General transfer comment: clsoe guard for safety. VCS safety, hand placement  Ambulation/Gait Ambulation/Gait assistance: Min guard Ambulation Distance (Feet): 115 Feet Assistive device: Rolling walker (2 wheeled) Gait Pattern/deviations: Step-to pattern;Decreased stride length;Antalgic;Step-through pattern     General Gait Details: close guard for safety. Pt beginning to transition into reciprocal gait pattern  Stairs            Wheelchair Mobility    Modified Rankin (Stroke Patients Only)       Balance                                             Pertinent Vitals/Pain Pain Assessment: 0-10 Pain Score: 7  Pain Location: R knee Pain Descriptors / Indicators: Aching;Sore Pain  Intervention(s): Monitored during session;Premedicated before session;Ice applied    Home Living Family/patient expects to be discharged to:: Private residence Living Arrangements: Spouse/significant other Available Help at Discharge: Family Type of Home: House Home Access: Stairs to enter Entrance Stairs-Rails: None Entrance Stairs-Number of Steps: 2 front Home Layout: One level Home Equipment: Cane - single point;Crutches      Prior Function Level of Independence: Independent               Hand Dominance   Dominant Hand: Right    Extremity/Trunk Assessment   Upper Extremity Assessment: Defer to OT evaluation           Lower Extremity Assessment: RLE deficits/detail RLE Deficits / Details: at least hip flex 3-/5, hip abd/add 3-/5, moves ankle well    Cervical / Trunk Assessment: Normal  Communication   Communication: No difficulties  Cognition Arousal/Alertness: Awake/alert Behavior During Therapy: WFL for tasks assessed/performed Overall Cognitive Status: Within Functional Limits for tasks assessed                      General Comments      Exercises Total Joint Exercises Ankle Circles/Pumps: AROM;Both;10 reps;Seated Quad Sets: AROM;Both;10 reps;Seated Heel Slides: AAROM;Right;10 reps;Seated Hip ABduction/ADduction: AAROM;Right;10 reps;Seated Straight Leg Raises: AAROM;Right;10 reps;Seated Goniometric ROM: 10-60 degrees      Assessment/Plan    PT Assessment Patient needs continued PT services  PT Diagnosis Difficulty walking;Acute pain  PT Problem List Decreased strength;Decreased range of motion;Decreased activity tolerance;Decreased balance;Decreased mobility;Decreased knowledge of use of DME;Pain  PT Treatment Interventions DME instruction;Gait training;Stair training;Functional mobility training;Therapeutic activities;Therapeutic exercise;Balance training   PT Goals (Current goals can be found in the Care Plan section) Acute Rehab PT  Goals Patient Stated Goal: to regain functional mobility and independence PT Goal Formulation: With patient Time For Goal Achievement: 07/05/14 Potential to Achieve Goals: Good    Frequency 7X/week   Barriers to discharge        Co-evaluation               End of Session   Activity Tolerance: Patient tolerated treatment well Patient left: with family/visitor present (OT took over session)           Time: 1053-1110 PT Time Calculation (min) (ACUTE ONLY): 17 min   Charges:   PT Evaluation $Initial PT Evaluation Tier I: 1 Procedure PT Treatments $Gait Training: 8-22 mins   PT G Codes:          Weston Anna, MPT Pager: [phone]

## 2014-06-28 NOTE — Progress Notes (Signed)
Subjective: Pt stable - pain ok   Objective: Vital signs in last 24 hours: Temp:  [97.9 F (36.6 C)-98.7 F (37.1 C)] 98.6 F (37 C) (12/12 0621) Pulse Rate:  [57-84] 72 (12/12 0732) Resp:  [10-18] 16 (12/12 0621) BP: (124-175)/(59-101) 139/67 mmHg (12/12 0732) SpO2:  [95 %-100 %] 97 % (12/12 0621) Weight:  [99.338 kg (219 lb)] 99.338 kg (219 lb) (12/11 1557)  Intake/Output from previous day: 12/11 0701 - 12/12 0700 In: 5376.3 [P.O.:1350; I.V.:3926.3; IV Piggyback:100] Out: 1600 [Urine:1400; Blood:200] Intake/Output this shift: Total I/O In: 536.3 [P.O.:240; I.V.:296.3] Out: -   Exam:  Neurovascular intact Sensation intact distally Intact pulses distally  Labs:  Recent Labs  06/28/14 0536  HGB 10.5*    Recent Labs  06/28/14 0536  WBC 12.9*  RBC 3.25*  HCT 31.4*  PLT 217    Recent Labs  06/28/14 0536  NA 130*  K 4.5  CL 95*  CO2 23  BUN 16  CREATININE 0.90  GLUCOSE 157*  CALCIUM 8.7   No results for input(s): LABPT, INR in the last 72 hours.  Assessment/Plan: Mobilize plan dc next week or possibly sunday   DEAN,GREGORY SCOTT 06/28/2014, 9:27 AM

## 2014-06-29 LAB — CBC
HCT: 24.9 % — ABNORMAL LOW (ref 39.0–52.0)
Hemoglobin: 8.4 g/dL — ABNORMAL LOW (ref 13.0–17.0)
MCH: 32.8 pg (ref 26.0–34.0)
MCHC: 33.7 g/dL (ref 30.0–36.0)
MCV: 97.3 fL (ref 78.0–100.0)
Platelets: 179 10*3/uL (ref 150–400)
RBC: 2.56 MIL/uL — ABNORMAL LOW (ref 4.22–5.81)
RDW: 12.7 % (ref 11.5–15.5)
WBC: 10.5 10*3/uL (ref 4.0–10.5)

## 2014-06-29 NOTE — Discharge Instructions (Signed)
Increase your activities as comfort allows. Expect significant knee swelling; ice and elevation as needed. You can get your current dressing wet daily in the shower.  Information on my medicine - XARELTO (Rivaroxaban)  This medication education was reviewed with me or my healthcare representative as part of my discharge preparation.  The pharmacist that spoke with me during my hospital stay was:  Lolita Patella, [facility]  Why was Xarelto prescribed for you? Xarelto was prescribed for you to reduce the risk of blood clots forming after orthopedic surgery. The medical term for these abnormal blood clots is venous thromboembolism (VTE).  What do you need to know about xarelto ? Take your Xarelto ONCE DAILY at the same time every day. You may take it either with or without food.  If you have difficulty swallowing the tablet whole, you may crush it and mix in applesauce just prior to taking your dose.  Take Xarelto exactly as prescribed by your doctor and DO NOT stop taking Xarelto without talking to the doctor who prescribed the medication.  Stopping without other VTE prevention medication to take the place of Xarelto may increase your risk of developing a clot.  After discharge, you should have regular check-up appointments with your healthcare provider that is prescribing your Xarelto.    What do you do if you miss a dose? If you miss a dose, take it as soon as you remember on the same day then continue your regularly scheduled once daily regimen the next day. Do not take two doses of Xarelto on the same day.   Important Safety Information A possible side effect of Xarelto is bleeding. You should call your healthcare provider right away if you experience any of the following: ? Bleeding from an injury or your nose that does not stop. ? Unusual colored urine (red or dark brown) or unusual colored stools (red or black). ? Unusual bruising for unknown reasons. ? A serious fall  or if you hit your head (even if there is no bleeding).  Some medicines may interact with Xarelto and might increase your risk of bleeding while on Xarelto. To help avoid this, consult your healthcare provider or pharmacist prior to using any new prescription or non-prescription medications, including herbals, vitamins, non-steroidal anti-inflammatory drugs (NSAIDs) and supplements.  This website has more information on Xarelto: [email or web address]

## 2014-06-29 NOTE — Progress Notes (Signed)
Physical Therapy Treatment Patient Details Name: [name] MRN: [id number] DOB: 1951-03-12 Today's Date: 06/29/2014    History of Present Illness 63 yo male s/p R TKA 06/27/14.     PT Comments    POD # 2 pm session.  Performed stair training with family.  Follow Up Recommendations  Home health PT     Equipment Recommendations  Rolling walker with 5" wheels    Recommendations for Other Services       Precautions / Restrictions Precautions Precautions: Knee;Fall Precaution Comments: pt able to perform active SLR Required Braces or Orthoses: Knee Immobilizer - Right Knee Immobilizer - Right: Discontinue once straight leg raise with < 10 degree lag Restrictions Weight Bearing Restrictions: No Other Position/Activity Restrictions: WBAT    Mobility  Bed Mobility               General bed mobility comments: Pt OOB in recliner  Transfers Overall transfer level: Needs assistance Equipment used: Rolling walker (2 wheeled) Transfers: Sit to/from Stand Sit to Stand: Supervision         General transfer comment: one VC on safety with turn completion   Ambulation/Gait Ambulation/Gait assistance: Supervision Ambulation Distance (Feet): 43 Feet Assistive device: Rolling walker (2 wheeled) Gait Pattern/deviations: Step-to pattern;Trunk flexed Gait velocity: decreased   General Gait Details: 25% VC's on proper sequencing and safety with turns   Stairs Stairs: Yes Stairs assistance: Min assist Stair Management: No rails;Step to pattern;Backwards;With walker Number of Stairs: 2 General stair comments: 50% VC's on proper sequencing and tech. Performed with spouse and daughter.  Wheelchair Mobility    Modified Rankin (Stroke Patients Only)       Balance                                    Cognition Arousal/Alertness: Awake/alert Behavior During Therapy: WFL for tasks assessed/performed Overall Cognitive Status: Within Functional  Limits for tasks assessed                      Exercises      General Comments        Pertinent Vitals/Pain Pain Assessment: No/denies pain    Home Living                      Prior Function            PT Goals (current goals can now be found in the care plan section) Progress towards PT goals: Progressing toward goals    Frequency  7X/week    PT Plan      Co-evaluation             End of Session Equipment Utilized During Treatment: Gait belt Activity Tolerance: Patient tolerated treatment well Patient left: in chair;with call bell/phone within reach     Time: 3716-9678 PT Time Calculation (min) (ACUTE ONLY): 16 min  Charges:  $Gait Training: 8-22 mins                     G Codes:      Rica Koyanagi  PTA [facility]  Acute  Rehab Pager      [phone]

## 2014-06-29 NOTE — Progress Notes (Signed)
Physical Therapy Treatment Patient Details Name: ANDRIEL OMALLEY MRN: [id number] DOB: April 24, 1951 Today's Date: 06/29/2014    History of Present Illness 63 yo male s/p R TKA 06/27/14.     PT Comments    POD # 2 am session.  Pt able to perform active SLR so instructed to D/C KI.  Assisted with amb in hallway and perform stairs up backward due to no rails.  Will need to repeat for family education.  Performed all supine TKR TE's following HEP handout followed by ICE.  Pt plans to go home later today.    Follow Up Recommendations  Home health PT     Equipment Recommendations  Rolling walker with 5" wheels    Recommendations for Other Services       Precautions / Restrictions Precautions Precautions: Knee;Fall Precaution Comments: pt able to perform active SLR Required Braces or Orthoses: Knee Immobilizer - Right Knee Immobilizer - Right: Discontinue once straight leg raise with < 10 degree lag Restrictions Weight Bearing Restrictions: No Other Position/Activity Restrictions: WBAT    Mobility  Bed Mobility               General bed mobility comments: Pt OOB in recliner  Transfers Overall transfer level: Needs assistance Equipment used: Rolling walker (2 wheeled) Transfers: Sit to/from Stand Sit to Stand: Supervision         General transfer comment: one VC on safety with turn completion   Ambulation/Gait Ambulation/Gait assistance: Supervision Ambulation Distance (Feet): 142 Feet Assistive device: Rolling walker (2 wheeled) Gait Pattern/deviations: Step-to pattern;Trunk flexed Gait velocity: decreased   General Gait Details: 25% VC's on proper sequencing and safety with turns   Stairs Stairs: Yes Stairs assistance: Min assist Stair Management: No rails;Step to pattern;Backwards;With walker Number of Stairs: 2 General stair comments: 50% VC's on proper sequencing and tech.  performed twice.  Will need to perform again with family.   Wheelchair  Mobility    Modified Rankin (Stroke Patients Only)       Balance                                    Cognition Arousal/Alertness: Awake/alert Behavior During Therapy: WFL for tasks assessed/performed Overall Cognitive Status: Within Functional Limits for tasks assessed                      Exercises   Total Knee Replacement TE's 10 reps B LE ankle pumps 10 reps towel squeezes 10 reps knee presses 10 reps heel slides  10 reps SAQ's 10 reps SLR's 10 reps ABD Followed by ICE     General Comments        Pertinent Vitals/Pain Pain Assessment: No/denies pain    Home Living                      Prior Function            PT Goals (current goals can now be found in the care plan section) Progress towards PT goals: Progressing toward goals    Frequency  7X/week    PT Plan      Co-evaluation             End of Session Equipment Utilized During Treatment: Gait belt Activity Tolerance: Patient tolerated treatment well Patient left: in chair;with call bell/phone within reach     Time: 0920-1000 PT Time Calculation (min) (  ACUTE ONLY): 40 min  Charges:  $Gait Training: 8-22 mins $Therapeutic Exercise: 8-22 mins $Therapeutic Activity: 8-22 mins                    G Codes:      Rica Koyanagi  PTA [facility]  Acute  Rehab Pager      [phone]

## 2014-06-29 NOTE — Progress Notes (Signed)
Occupational Therapy Treatment Patient Details Name: HUDSON MAJKOWSKI MRN: [id number] DOB: [birth year] Today's Date: 06/29/2014    History of present illness 63 yo male s/p R TKA 06/27/14.    OT comments  All OT education completed. Patient to discharge home with wife's assistance today.   Follow Up Recommendations  No OT follow up;Supervision - Intermittent    Equipment Recommendations  3 in 1 bedside comode    Recommendations for Other Services      Precautions / Restrictions Precautions Precautions: Knee;Fall Required Braces or Orthoses: Knee Immobilizer - Right Knee Immobilizer - Right: Discontinue once straight leg raise with < 10 degree lag       Mobility Bed Mobility                  Transfers                      Balance                                   ADL                                         General ADL Comments: Reviewed LB techniques with patient. He reports wife will assist. Educated patient on walk-in shower transfer technique. He verbalized understanding but declined practicing. He has a walk-in shower with grab bar and a shower seat. Educated patient to have his wife there when he showers. All education complete.      Vision                     Perception     Praxis      Cognition   Behavior During Therapy: WFL for tasks assessed/performed Overall Cognitive Status: Within Functional Limits for tasks assessed                       Extremity/Trunk Assessment               Exercises     Shoulder Instructions       General Comments      Pertinent Vitals/ Pain       Pain Assessment: No/denies pain  Home Living                                          Prior Functioning/Environment              Frequency Min 2X/week     Progress Toward Goals  OT Goals(current goals can now be found in the care plan section)  Progress towards OT  goals: Progressing toward goals     Plan Discharge plan remains appropriate    Co-evaluation                 End of Session     Activity Tolerance Patient tolerated treatment well   Patient Left in chair;with call bell/phone within reach   Nurse Communication          Time: 9357-0177 OT Time Calculation (min): 11 min  Charges: OT General Charges $OT Visit: 1 Procedure OT Treatments $Self Care/Home Management : 8-22 mins  Atley Scarboro A 06/29/2014, 10:35  AM    

## 2014-06-29 NOTE — Progress Notes (Signed)
Patient discharged home.  Reviewed discharge paperwork with patient, wife and daughter, all verbalized understanding.  Prescriptions given to patient's wife on Saturday, Dec. 12th in order to have medications needed at home already filled, Dr. Ninfa Linden aware and Laurie Panda.   Patient escorted off unit with NT via wheelchair.  Christen Bame RN

## 2014-06-29 NOTE — Progress Notes (Signed)
Subjective: 2 Days Post-Op Procedure(s) (LRB): RIGHT TOTAL KNEE ARTHROPLASTY (Right) Patient reports pain as moderate.  Working well with therapy.  Acute blood loss anemia, but asymptomatic.  Objective: Vital signs in last 24 hours: Temp:  [98.3 F (36.8 C)-99.2 F (37.3 C)] 99.2 F (37.3 C) (12/13 0530) Pulse Rate:  [83-84] 84 (12/13 0530) Resp:  [16-17] 16 (12/13 0530) BP: (93-140)/(41-71) 123/52 mmHg (12/13 0530) SpO2:  [93 %-99 %] 94 % (12/13 0530)  Intake/Output from previous day: 12/12 0701 - 12/13 0700 In: 1376.3 [P.O.:1080; I.V.:296.3] Out: 75 [Urine:75] Intake/Output this shift: Total I/O In: 360 [P.O.:360] Out: 100 [Urine:100]   Recent Labs  06/28/14 0536 06/29/14 0540  HGB 10.5* 8.4*    Recent Labs  06/28/14 0536 06/29/14 0540  WBC 12.9* 10.5  RBC 3.25* 2.56*  HCT 31.4* 24.9*  PLT 217 179    Recent Labs  06/28/14 0536  NA 130*  K 4.5  CL 95*  CO2 23  BUN 16  CREATININE 0.90  GLUCOSE 157*  CALCIUM 8.7   No results for input(s): LABPT, INR in the last 72 hours.  Sensation intact distally Intact pulses distally Dorsiflexion/Plantar flexion intact Incision: no drainage No cellulitis present Compartment soft  Assessment/Plan: 2 Days Post-Op Procedure(s) (LRB): RIGHT TOTAL KNEE ARTHROPLASTY (Right) Up with therapy Discharge home with home health today.  Ellee Wawrzyniak Y 06/29/2014, 8:56 AM

## 2014-06-29 NOTE — Discharge Summary (Signed)
Patient ID: [name] MRN: [id number] DOB/AGE: Sep 19, 1950 63 y.o.  Admit date: 06/27/2014 Discharge date: 06/29/2014  Admission Diagnoses:  Principal Problem:   Primary osteoarthritis of right knee Active Problems:   Status post total right knee replacement   Discharge Diagnoses:  Same  Past Medical History  Diagnosis Date  . Allergic rhinitis 07/1998  . COPD (chronic obstructive pulmonary disease) 07/1998    w ongoing tobacco use, spirometry (02/2013)  . Hyperlipidemia 06/1996  . HTN (hypertension) 05/1998  . ETOH abuse   . Smoker   . Hepatitis     pos test once not now  . Lumbar spinal stenosis 03/2013    s/p laminectomy  . Dysrhythmia     PVC'S  . Shortness of breath dyspnea     WITH EXERTION  . 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 Arthritis     BACK AND RIGHT KNEE    Surgeries: Procedure(s): RIGHT TOTAL KNEE ARTHROPLASTY on 06/27/2014   Consultants:    Discharged Condition: Improved  Hospital Course: [name] is an 63 y.o. male who was admitted 06/27/2014 for operative treatment ofArthritis of right knee. Patient has severe unremitting pain that affects sleep, daily activities, and work/hobbies. After pre-op clearance the patient was taken to the operating room on 06/27/2014 and underwent  Procedure(s): RIGHT TOTAL KNEE ARTHROPLASTY.    Patient was given perioperative antibiotics: Anti-infectives    Start     Dose/Rate Route Frequency Ordered Stop   06/27/14 1800  ceFAZolin (ANCEF) IVPB 1 g/50 mL premix     1 g100 mL/hr over 30 Minutes Intravenous Every 6 hours 06/27/14 1613 06/28/14 0000   06/27/14 0935  ceFAZolin (ANCEF) IVPB 2 g/50 mL premix     2 g100 mL/hr over 30 Minutes Intravenous On call to O.R. 06/27/14 0935 06/27/14 1206       Patient was given sequential compression devices, early ambulation, and chemoprophylaxis to prevent DVT.  Patient benefited maximally from hospital stay and there were no complications.     Recent vital signs: Patient Vitals for the past 24 hrs:  BP Temp Temp src Pulse Resp SpO2  06/29/14 0530 (!) 123/52 mmHg 99.2 F (37.3 C) Oral 84 16 94 %  06/29/14 0510 - 98.6 F (37 C) Oral - - -  06/28/14 2040 (!) 93/41 mmHg 98.6 F (37 C) Oral 83 16 93 %  06/28/14 1349 140/71 mmHg 98.3 F (36.8 C) Oral 83 17 99 %     Recent laboratory studies:  Recent Labs  06/28/14 0536 06/29/14 0540  WBC 12.9* 10.5  HGB 10.5* 8.4*  HCT 31.4* 24.9*  PLT 217 179  NA 130*  --   K 4.5  --   CL 95*  --   CO2 23  --   BUN 16  --   CREATININE 0.90  --   GLUCOSE 157*  --   CALCIUM 8.7  --      Discharge Medications:     Medication List    TAKE these medications        aspirin EC 81 MG tablet  Take 81 mg by mouth daily.     atorvastatin 10 MG tablet  Commonly known as:  LIPITOR  Take 1 tablet (10 mg total) by mouth daily.     budesonide-formoterol 80-4.5 MCG/ACT inhaler  Commonly known as:  SYMBICORT  Inhale 2 puffs into the lungs daily as needed (shortness of breath).     DSS 100  MG Caps  Take 100 mg by mouth 2 (two) times daily.     fluticasone 50 MCG/ACT nasal spray  Commonly known as:  FLONASE  Place 2 sprays into both nostrils daily.     lisinopril 10 MG tablet  Commonly known as:  PRINIVIL,ZESTRIL  TAKE 1 TABLET BY MOUTH ONCE A DAY     methocarbamol 500 MG tablet  Commonly known as:  ROBAXIN  Take 1 tablet (500 mg total) by mouth every 6 (six) hours as needed for muscle spasms.     OVER THE COUNTER MEDICATION  Take 1 capsule by mouth daily. [employer] B     OVER THE COUNTER MEDICATION  Take 1 capsule by mouth daily. [employer] Sulfur[employer] (for healthy immune system and to boost liver function     oxyCODONE-acetaminophen 5-325 MG per tablet  Commonly known as:  ROXICET  Take 1-2 tablets by mouth every 4 (four) hours as needed.     rivaroxaban 10 MG Tabs tablet  Commonly known as:  XARELTO  Take 1 tablet (10 mg total) by mouth daily with  breakfast.     tadalafil 20 MG tablet  Commonly known as:  CIALIS  Take 1 tablet (20 mg total) by mouth daily as needed for erectile dysfunction.     VITAMIN B-2 PO  Take 1 tablet by mouth daily.        Diagnostic Studies: Dg Knee Right Port  06/27/2014   CLINICAL DATA:  Immediate postop right total knee arthroplasty.  EXAM: PORTABLE RIGHT KNEE - 1-2 VIEW  COMPARISON:  None.  FINDINGS: Right total knee arthroplasty with anatomic alignment. No complicating features.  IMPRESSION: Anatomic alignment post right total knee arthroplasty without acute complicating features.   Electronically Signed   By: Evangeline Dakin M.D.   On: 06/27/2014 15:08    Disposition: 01-Home or Self Care      Discharge Instructions    Discharge patient    Complete by:  As directed            Follow-up Information    Follow up with Mcarthur Rossetti, MD In 2 weeks.   Specialty:  Orthopedic Surgery   Contact information:   [place] [address] [phone]        Signed: Mcarthur Rossetti 06/29/2014, 8:57 AM

## 2014-06-30 NOTE — Progress Notes (Signed)
Utilization review completed.  

## 2014-07-16 ENCOUNTER — Other Ambulatory Visit: Payer: Self-pay | Admitting: Family Medicine

## 2014-08-27 ENCOUNTER — Other Ambulatory Visit (INDEPENDENT_AMBULATORY_CARE_PROVIDER_SITE_OTHER): Payer: BLUE CROSS/BLUE SHIELD

## 2014-08-27 DIAGNOSIS — D649 Anemia, unspecified: Secondary | ICD-10-CM

## 2014-08-27 DIAGNOSIS — R7309 Other abnormal glucose: Secondary | ICD-10-CM

## 2014-08-27 LAB — CBC WITH DIFFERENTIAL/PLATELET
Basophils Absolute: 0 10*3/uL (ref 0.0–0.1)
Basophils Relative: 0.4 % (ref 0.0–3.0)
Eosinophils Absolute: 0.1 10*3/uL (ref 0.0–0.7)
Eosinophils Relative: 1.4 % (ref 0.0–5.0)
HCT: 40.3 % (ref 39.0–52.0)
Hemoglobin: 13.9 g/dL (ref 13.0–17.0)
Lymphocytes Relative: 44 % (ref 12.0–46.0)
Lymphs Abs: 2.9 10*3/uL (ref 0.7–4.0)
MCHC: 34.4 g/dL (ref 30.0–36.0)
MCV: 92.6 fl (ref 78.0–100.0)
Monocytes Absolute: 0.6 10*3/uL (ref 0.1–1.0)
Monocytes Relative: 9.3 % (ref 3.0–12.0)
Neutro Abs: 2.9 10*3/uL (ref 1.4–7.7)
Neutrophils Relative %: 44.9 % (ref 43.0–77.0)
Platelets: 331 10*3/uL (ref 150.0–400.0)
RBC: 4.35 Mil/uL (ref 4.22–5.81)
RDW: 13.4 % (ref 11.5–15.5)
WBC: 6.5 10*3/uL (ref 4.0–10.5)

## 2014-08-27 LAB — BASIC METABOLIC PANEL
BUN: 11 mg/dL (ref 6–23)
CO2: 30 mEq/L (ref 19–32)
Calcium: 9.7 mg/dL (ref 8.4–10.5)
Chloride: 99 mEq/L (ref 96–112)
Creatinine, Ser: 0.91 mg/dL (ref 0.40–1.50)
GFR: 89.39 mL/min (ref >60.00)
Glucose, Bld: 117 mg/dL — ABNORMAL HIGH (ref 70–99)
Potassium: 5.1 mEq/L (ref 3.5–5.1)
Sodium: 134 mEq/L — ABNORMAL LOW (ref 135–145)

## 2014-08-27 LAB — HEMOGLOBIN A1C: Hgb A1c MFr Bld: 5.9 % (ref 4.6–6.5)

## 2014-08-28 ENCOUNTER — Other Ambulatory Visit (INDEPENDENT_AMBULATORY_CARE_PROVIDER_SITE_OTHER): Payer: BLUE CROSS/BLUE SHIELD

## 2014-08-28 DIAGNOSIS — E871 Hypo-osmolality and hyponatremia: Secondary | ICD-10-CM

## 2014-08-28 LAB — LIPID PANEL
Cholesterol: 187 mg/dL (ref 0–200)
HDL: 67.8 mg/dL (ref >39.00)
LDL Cholesterol: 101 mg/dL — ABNORMAL HIGH (ref 0–99)
NonHDL: 119.2
Total CHOL/HDL Ratio: 3
Triglycerides: 89 mg/dL (ref 0.0–149.0)
VLDL: 17.8 mg/dL (ref 0.0–40.0)

## 2014-09-01 ENCOUNTER — Encounter: Payer: BC Managed Care – PPO | Admitting: Family Medicine

## 2014-09-03 ENCOUNTER — Encounter: Payer: Self-pay | Admitting: Family Medicine

## 2014-09-03 ENCOUNTER — Ambulatory Visit (INDEPENDENT_AMBULATORY_CARE_PROVIDER_SITE_OTHER): Payer: BLUE CROSS/BLUE SHIELD | Admitting: Family Medicine

## 2014-09-03 VITALS — BP 126/82 | HR 84 | Temp 98.3°F | Ht 66.0 in | Wt 214.0 lb

## 2014-09-03 DIAGNOSIS — Z7189 Other specified counseling: Secondary | ICD-10-CM | POA: Insufficient documentation

## 2014-09-03 DIAGNOSIS — J449 Chronic obstructive pulmonary disease, unspecified: Secondary | ICD-10-CM

## 2014-09-03 DIAGNOSIS — F17200 Nicotine dependence, unspecified, uncomplicated: Secondary | ICD-10-CM

## 2014-09-03 DIAGNOSIS — E669 Obesity, unspecified: Secondary | ICD-10-CM

## 2014-09-03 DIAGNOSIS — R7303 Prediabetes: Secondary | ICD-10-CM

## 2014-09-03 DIAGNOSIS — Z0000 Encounter for general adult medical examination without abnormal findings: Secondary | ICD-10-CM

## 2014-09-03 DIAGNOSIS — K921 Melena: Secondary | ICD-10-CM

## 2014-09-03 DIAGNOSIS — I10 Essential (primary) hypertension: Secondary | ICD-10-CM

## 2014-09-03 DIAGNOSIS — E785 Hyperlipidemia, unspecified: Secondary | ICD-10-CM

## 2014-09-03 LAB — HEMOCCULT GUIAC POC 1CARD (OFFICE): Fecal Occult Blood, POC: NEGATIVE

## 2014-09-03 NOTE — Assessment & Plan Note (Signed)
Advanced directives: has at home. HCPOA is daughter, Crystal. Will bring us copy.  

## 2014-09-03 NOTE — Assessment & Plan Note (Signed)
Continue to encourage cessation. precontemplative. 

## 2014-09-03 NOTE — Assessment & Plan Note (Addendum)
Preventative protocols reviewed and updated unless pt declined. Discussed healthy diet and lifestyle.  Check PSA next labwork

## 2014-09-03 NOTE — Patient Instructions (Addendum)
Bring [place] copy of living will. Keep an eye on blood in stool. If persistent let me know. Return as needed or in 1 year for next physical. Keep working on quitting smoking. Look at [email or web address] for further resources.

## 2014-09-03 NOTE — Progress Notes (Signed)
BP 126/82 mmHg  Pulse 84  Temp(Src) 98.3 F (36.8 C) (Oral)  Ht 5\' 6"  (1.676 m)  Wt 214 lb (97.07 kg)  BMI 34.56 kg/m2   CC: CPE  Subjective:    Patient ID: [name], male    DOB: [birth year], 64 y.o.   MRN: [id number]  HPI: [name] is a 64 y.o. male presenting on 09/03/2014 for Annual Exam   Recent R total knee replacement by Dr Ninfa Linden (06/2014). Recovering well. Now doing home PT.   COPD - mild on spirometry. On symbicort daily. Continues smoking 1/2 ppd.   Blood on toilet paper in last 2 wks. Happened after meal with chilli. No hemorrhoids. No pain.   Preventative: Colonoscopy 09/23/2010 - polyps, diverticula, hemorrhoids. Rpt 5 yrs. [facility] [place]).  Prostate - normal DRE/PSA in past. Nocturia x3-4. Strong stream. Q47yr checks - due today. Flu shot - 03/2014 Tdap 08/2012 Pneumovax - 08/2013 zostavax - 09/2013 Advanced directives: has at home. HCPOA is daughter, [name]. Will bring [place] copy  Lives with wife. Has 1 son, 2 daughters.  Occupation: Psychologist, clinical.  Activity: no exercise  Diet: good water, fruits/vegetables daily  Relevant past medical, surgical, family and social history reviewed and updated as indicated. Interim medical history since our last visit reviewed. Allergies and medications reviewed and updated. Current Outpatient Prescriptions on File Prior to Visit  Medication Sig  . aspirin EC 81 MG tablet Take 81 mg by mouth daily.  [name] atorvastatin (LIPITOR) 10 MG tablet Take 1 tablet (10 mg total) by mouth daily.  . budesonide-formoterol (SYMBICORT) 80-4.5 MCG/ACT inhaler Inhale 2 puffs into the lungs daily as needed (shortness of breath).  [name] lisinopril (PRINIVIL,ZESTRIL) 10 MG tablet TAKE ONE TABLET BY MOUTH EVERY DAY  . OVER THE COUNTER MEDICATION Take 1 capsule by mouth daily. [employer] B  . OVER THE COUNTER MEDICATION Take 1 capsule by mouth daily. [employer] Sulfur[employer] (for healthy immune system and to boost liver function   No  current facility-administered medications on file prior to visit.    Review of Systems  Constitutional: Negative for fever, chills, activity change, appetite change, fatigue and unexpected weight change.  HENT: Negative for hearing loss.   Eyes: Negative for visual disturbance.  Respiratory: Negative for cough, chest tightness, shortness of breath and wheezing.   Cardiovascular: Negative for chest pain, palpitations and leg swelling.  Gastrointestinal: Positive for blood in stool (BRB in last 2 weeks). Negative for nausea, vomiting, abdominal pain, diarrhea, constipation and abdominal distention.  Genitourinary: Negative for hematuria and difficulty urinating.  Musculoskeletal: Negative for myalgias, arthralgias and neck pain.  Skin: Negative for rash.  Neurological: Negative for dizziness, seizures, syncope and headaches.  Hematological: Negative for adenopathy. Does not bruise/bleed easily.  Psychiatric/Behavioral: Negative for dysphoric mood. The patient is not nervous/anxious.    Per HPI unless specifically indicated above     Objective:    BP 126/82 mmHg  Pulse 84  Temp(Src) 98.3 F (36.8 C) (Oral)  Ht 5\' 6"  (1.676 m)  Wt 214 lb (97.07 kg)  BMI 34.56 kg/m2  Wt Readings from Last 3 Encounters:  09/03/14 214 lb (97.07 kg)  06/27/14 219 lb (99.338 kg)  04/18/14 220 lb 4 oz (99.905 kg)    Physical Exam  Constitutional: He is oriented to person, place, and time. He appears well-developed and well-nourished. No distress.  HENT:  Head: Normocephalic and atraumatic.  Right Ear: Hearing, tympanic membrane, external ear and ear canal normal.  Left Ear:  Hearing, tympanic membrane, external ear and ear canal normal.  Nose: Nose normal.  Mouth/Throat: Uvula is midline, oropharynx is clear and moist and mucous membranes are normal. No oropharyngeal exudate, posterior oropharyngeal edema or posterior oropharyngeal erythema.  Eyes: Conjunctivae and EOM are normal. Pupils are equal,  round, and reactive to light. No scleral icterus.  Neck: Normal range of motion. Neck supple. Carotid bruit is not present. No thyromegaly present.  Cardiovascular: Normal rate, regular rhythm, normal heart sounds and intact distal pulses.   No murmur heard. Pulses:      Radial pulses are 2+ on the right side, and 2+ on the left side.  Pulmonary/Chest: Effort normal and breath sounds normal. No respiratory distress. He has no wheezes. He has no rales.  Abdominal: Soft. Bowel sounds are normal. He exhibits no distension and no mass. There is no tenderness. There is no rebound and no guarding.  Genitourinary: Rectum normal and prostate normal. Rectal exam shows no external hemorrhoid, no internal hemorrhoid, no fissure, no mass, no tenderness and anal tone normal. Guaiac negative stool. Prostate is not enlarged (15gm) and not tender.  Musculoskeletal: Normal range of motion. He exhibits no edema.  Lymphadenopathy:    He has no cervical adenopathy.  Neurological: He is alert and oriented to person, place, and time.  CN grossly intact, station and gait intact  Skin: Skin is warm and dry. No rash noted.  Psychiatric: He has a normal mood and affect. His behavior is normal. Judgment and thought content normal.  Nursing note and vitals reviewed.  Results for orders placed or performed in visit on 09/03/14  Hemoccult - 1 Card (office)  Result Value Ref Range   Fecal Occult Blood, POC Negative    Card #1 Date today    Card #2 Fecal Occult Blod, POC     Card #2 Date     Card #3 Fecal Occult Blood, POC     Card #3 Date        Assessment & Plan:   Problem List Items Addressed This Visit    Smoker    Continue to encourage cessation. precontemplative.      Prediabetes    Borderline - continue to monitor. Discussed avoiding added sugars      Obesity    Discussed healthy diet/lifestyle changes to affect sustainable weight loss      HLD (hyperlipidemia)    Chronic, stable on low dose  lipitor-  continue      Health maintenance examination - Primary    Preventative protocols reviewed and updated unless pt declined. Discussed healthy diet and lifestyle.  Check PSA next labwork      Essential hypertension    Chronic, stable. Continue lisinopril.      COPD (chronic obstructive pulmonary disease)    Continue symbicort. continue to encourage smoking cessation      Blood in stool    No hemorrhoids evident today. ?meal related red substance (tomatoes). UTD colonoscopy. Monitor for now and update if persistent, may need sooner referral to GI.      Relevant Orders   Hemoccult - 1 Card (office) (Completed)   Advanced care planning/counseling discussion    Advanced directives: has at home. HCPOA is daughter, [name]. Will bring [place] copy          Follow up plan: Return in about 1 year (around 09/04/2015), or as needed, for annual exam, prior fasting for blood work.

## 2014-09-03 NOTE — Assessment & Plan Note (Addendum)
Continue symbicort. continue to encourage smoking cessation

## 2014-09-03 NOTE — Assessment & Plan Note (Addendum)
Chronic, stable. Continue lisinopril.  

## 2014-09-03 NOTE — Assessment & Plan Note (Signed)
No hemorrhoids evident today. ?meal related red substance (tomatoes). UTD colonoscopy. Monitor for now and update if persistent, may need sooner referral to GI.

## 2014-09-03 NOTE — Assessment & Plan Note (Signed)
Chronic, stable on low dose lipitor - continue.  

## 2014-09-03 NOTE — Assessment & Plan Note (Signed)
Discussed healthy diet/lifestyle changes to affect sustainable weight loss

## 2014-09-03 NOTE — Progress Notes (Signed)
Pre visit review using our clinic review tool, if applicable. No additional management support is needed unless otherwise documented below in the visit note. 

## 2014-09-03 NOTE — Assessment & Plan Note (Signed)
Borderline - continue to monitor. Discussed avoiding added sugars

## 2014-09-25 ENCOUNTER — Telehealth: Payer: Self-pay | Admitting: Family Medicine

## 2014-09-26 MED ORDER — TADALAFIL 20 MG PO TABS: ORAL_TABLET | ORAL | Status: DC

## 2014-09-26 NOTE — Telephone Encounter (Signed)
[place] left v/m that pt's ins will cover cialis 20 mg quantity # 8 and request cb and change quantity to # 8.Please advise.

## 2014-09-26 NOTE — Addendum Note (Signed)
Addended by: Royann Shivers A on: 09/26/2014 12:59 PM   Modules accepted: Orders

## 2014-09-26 NOTE — Telephone Encounter (Signed)
Quantity changed and sent in.

## 2014-11-20 ENCOUNTER — Other Ambulatory Visit: Payer: Self-pay | Admitting: Family Medicine

## 2014-11-26 ENCOUNTER — Other Ambulatory Visit: Payer: Self-pay | Admitting: Family Medicine

## 2015-04-14 ENCOUNTER — Ambulatory Visit (INDEPENDENT_AMBULATORY_CARE_PROVIDER_SITE_OTHER): Payer: BLUE CROSS/BLUE SHIELD

## 2015-04-14 DIAGNOSIS — Z23 Encounter for immunization: Secondary | ICD-10-CM

## 2015-06-29 ENCOUNTER — Ambulatory Visit
Admission: RE | Admit: 2015-06-29 | Discharge: 2015-06-29 | Disposition: A | Discharge status: Home / Self Care | Payer: BLUE CROSS/BLUE SHIELD | Source: Ambulatory Visit | Attending: Cardiovascular Disease | Admitting: Cardiovascular Disease

## 2015-06-29 ENCOUNTER — Other Ambulatory Visit
Admission: RE | Admit: 2015-06-29 | Discharge: 2015-06-29 | Disposition: A | Discharge status: Home / Self Care | Payer: BLUE CROSS/BLUE SHIELD | Source: Ambulatory Visit | Attending: Cardiovascular Disease | Admitting: Cardiovascular Disease

## 2015-06-29 ENCOUNTER — Ambulatory Visit (INDEPENDENT_AMBULATORY_CARE_PROVIDER_SITE_OTHER): Payer: BLUE CROSS/BLUE SHIELD | Admitting: Cardiovascular Disease

## 2015-06-29 ENCOUNTER — Inpatient Hospital Stay: Admission: RE | Admit: 2015-06-29 | Payer: Self-pay | Source: Ambulatory Visit

## 2015-06-29 ENCOUNTER — Encounter: Payer: Self-pay | Admitting: Cardiovascular Disease

## 2015-06-29 VITALS — BP 140/68 | HR 83 | Ht 67.0 in | Wt 239.5 lb

## 2015-06-29 DIAGNOSIS — R0609 Other forms of dyspnea: Secondary | ICD-10-CM | POA: Diagnosis not present

## 2015-06-29 DIAGNOSIS — R0602 Shortness of breath: Secondary | ICD-10-CM | POA: Diagnosis not present

## 2015-06-29 DIAGNOSIS — Z01812 Encounter for preprocedural laboratory examination: Secondary | ICD-10-CM | POA: Insufficient documentation

## 2015-06-29 DIAGNOSIS — Z720 Tobacco use: Secondary | ICD-10-CM

## 2015-06-29 DIAGNOSIS — R5383 Other fatigue: Secondary | ICD-10-CM | POA: Diagnosis not present

## 2015-06-29 DIAGNOSIS — F17200 Nicotine dependence, unspecified, uncomplicated: Secondary | ICD-10-CM

## 2015-06-29 DIAGNOSIS — Z01810 Encounter for preprocedural cardiovascular examination: Secondary | ICD-10-CM | POA: Diagnosis not present

## 2015-06-29 DIAGNOSIS — R0600 Dyspnea, unspecified: Secondary | ICD-10-CM | POA: Insufficient documentation

## 2015-06-29 DIAGNOSIS — I10 Essential (primary) hypertension: Secondary | ICD-10-CM

## 2015-06-29 LAB — BASIC METABOLIC PANEL
Anion gap: 7 (ref 5–15)
BUN: 19 mg/dL (ref 6–20)
CO2: 27 mmol/L (ref 22–32)
Calcium: 9.5 mg/dL (ref 8.9–10.3)
Chloride: 102 mmol/L (ref 101–111)
Creatinine, Ser: 0.96 mg/dL (ref 0.61–1.24)
GFR calc Af Amer: >60 mL/min (ref >60)
GFR calc non Af Amer: >60 mL/min (ref >60)
Glucose, Bld: 118 mg/dL — ABNORMAL HIGH (ref 65–99)
Potassium: 4.4 mmol/L (ref 3.5–5.1)
Sodium: 136 mmol/L (ref 135–145)

## 2015-06-29 LAB — CBC
HCT: 39.4 % — ABNORMAL LOW (ref 40.0–52.0)
Hemoglobin: 13 g/dL (ref 13.0–18.0)
MCH: 31.9 pg (ref 26.0–34.0)
MCHC: 32.9 g/dL (ref 32.0–36.0)
MCV: 96.8 fL (ref 80.0–100.0)
Platelets: 216 10*3/uL (ref 150–440)
RBC: 4.07 MIL/uL — ABNORMAL LOW (ref 4.40–5.90)
RDW: 13.2 % (ref 11.5–14.5)
WBC: 6.7 10*3/uL (ref 3.8–10.6)

## 2015-06-29 LAB — PROTIME-INR
INR: 0.94
Prothrombin Time: 12.8 seconds (ref 11.4–15.0)

## 2015-06-29 NOTE — Assessment & Plan Note (Signed)
Certainly, this could be multifactorial due to COPD, physical deconditioning and obesity. Nonetheless, the patient had significant worsening in symptoms since his most recent evaluation by me in 2014. He is having dyspnea now with minimal activities even with walking to the kitchen. Given his multiple risk factors for coronary artery disease, I think the best option is proceeding with cardiac catheterization and possible coronary intervention. Given his obesity and lung disease, I don't think a stress test is going to be accurate enough to exclude ischemic heart disease with confidence especially with in the setting of such progression of symptoms. I discussed the risks and benefits of cardiac catheterization details.

## 2015-06-29 NOTE — Patient Instructions (Signed)
Medication Instructions:  Your physician recommends that you continue on your current medications as directed. Please refer to the Current Medication list given to you today.   Labwork: BMET, CBC, PT/INR  Testing/Procedures: A chest x-ray takes a picture of the organs and structures inside the chest, including the heart, lungs, and blood vessels. This test can show several things, including, whether the heart is enlarges; whether fluid is building up in the lungs; and whether pacemaker / defibrillator leads are still in place.  Your physician has requested that you have a cardiac catheterization. Cardiac catheterization is used to diagnose and/or treat various heart conditions. Doctors may recommend this procedure for a number of different reasons. The most common reason is to evaluate chest pain. Chest pain can be a symptom of coronary artery disease (CAD), and cardiac catheterization can show whether plaque is narrowing or blocking your heart's arteries. This procedure is also used to evaluate the valves, as well as measure the blood flow and oxygen levels in different parts of your heart. For further information please visit [email or web address]. Please follow instruction sheet, as given.  [place] Cardiac Cath Instructions   You are scheduled for a Cardiac Cath on: Wed, Dec. 14  Please arrive at  10am in Short Stay on the day of your procedure  Do not eat/drink anything after midnight  Someone will need to drive you home  It is recommended someone be with you for the first 24 hours after your procedure  Wear clothes that are easy to get on/off and wear slip on shoes if possible   Medications bring a current list of all medications with you  __xx_ You may take all of your medications the morning of your procedure with enough water to swallow safely    Day of your procedure: Arrive at [place] Stay entrance.   [facility] [phone]  The usual length of stay after your procedure is about 2 to 3 hours.  This can vary.  If you have any questions, please call our office at [phone], or you may call the cardiac cath lab at [facility] directly at [phone]   Follow-Up: Your physician recommends that you schedule a follow-up appointment in: one month with Dr. Fletcher Anon.       Any Other Special Instructions Will Be Listed Below (If Applicable).     If you need a refill on your cardiac medications before your next appointment, please call your pharmacy.  Angiogram An angiogram, also called angiography, is a procedure used to look at the blood vessels. In this procedure, dye is injected through a long, thin tube (catheter) into an artery. X-rays are then taken. The X-rays will show if there is a blockage or problem in a blood vessel.  LET [facility] CARE PROVIDER KNOW ABOUT:  Any allergies you have, including allergies to shellfish or contrast dye.   All medicines you are taking, including vitamins, herbs, eye drops, creams, and over-the-counter medicines.   Previous problems you or members of your family have had with the use of anesthetics.   Any blood disorders you have.   Previous surgeries you have had.  Any previous kidney problems or failure you have had.  Medical conditions you have.   Possibility of pregnancy, if this applies. RISKS AND COMPLICATIONS Generally, an angiogram is a safe procedure. However, as with any procedure, problems can occur. Possible problems include:  Injury to the blood vessels, including rupture or bleeding.  Infection or bruising at the catheter site.  Allergic reaction to the dye or contrast used.  Kidney damage from the dye or contrast used.  Blood clots that can lead to a stroke or heart attack. BEFORE THE PROCEDURE  Do not eat or drink after midnight on the night before the procedure, or as directed by your health care  provider.   Ask your health care provider if you may drink enough water to take any needed medicines the morning of the procedure.  PROCEDURE  You may be given a medicine to help you relax (sedative) before and during the procedure. This medicine is given through an IV access tube that is inserted into one of your veins.   The area where the catheter will be inserted will be washed and shaved. This is usually done in the groin but may be done in the fold of your arm (near your elbow) or in the wrist.  A medicine will be given to numb the area where the catheter will be inserted (local anesthetic).  The catheter will be inserted with a guide wire into an artery. The catheter is guided by using a type of X-ray (fluoroscopy) to the blood vessel being examined.   Dye is then injected into the catheter, and X-rays are taken. The dye helps to show where any narrowing or blockages are located.  AFTER THE PROCEDURE   If the procedure is done through the leg, you will be kept in bed lying flat for several hours. You will be instructed to not bend or cross your legs.  The insertion site will be checked frequently.  The pulse in your feet or wrist will be checked frequently.  Additional blood tests, X-rays, and electrocardiography may be done.   You may need to stay in the hospital overnight for observation.    This information is not intended to replace advice given to you by your health care provider. Make sure you discuss any questions you have with your health care provider.   Document Released: 04/13/2005 Document Revised: 07/25/2014 Document Reviewed: 12/05/2012 Elsevier Interactive Patient Education 2016 [place] [employer] These instructions give you information about caring for yourself after your procedure. Your doctor may also give you more specific instructions. Call your doctor if you have any problems or questions after your procedure.  HOME CARE  Take  medicines only as told by your doctor.  Follow your doctor's instructions about:  Care of the area where the tube was inserted.  Bandage (dressing) changes and removal.  You may shower 24-48 hours after the procedure or as told by your doctor.  Do not take baths, swim, or use a hot tub until your doctor approves.  Every day, check the area where the tube was inserted. Watch for:  Redness, swelling, or pain.  Fluid, blood, or pus.  Do not apply powder or lotion to the site.  Do not lift anything that is heavier than 10 lb (4.5 kg) for 5 days or as told by your doctor.  Ask your doctor when you can:  Return to work or school.  Do physical activities or play sports.  Have sex.  Do not drive or operate heavy machinery for 24 hours or as told by your doctor.  Have someone with you for the first 24 hours after the procedure.  Keep all follow-up visits as told by your doctor. This is important. GET HELP IF:  You have a fever.   You have chills.  You have more bleeding from the area where the tube was inserted. Hold pressure on the area.  You have redness, swelling, or pain in the area where the tube was inserted.  You have fluid or pus coming from the area. GET HELP RIGHT AWAY IF:   You have a lot of pain in the area where the tube was inserted.  The area where the tube was inserted is bleeding, and the bleeding does not stop after 30 minutes of holding steady pressure on the area.  The area near or just beyond the insertion site becomes pale, cool, tingly, or numb.   This information is not intended to replace advice given to you by your health care provider. Make sure you discuss any questions you have with your health care provider.   Document Released: 09/30/2008 Document Revised: 07/25/2014 Document Reviewed: 12/05/2012 Elsevier Interactive Patient Education [employer].

## 2015-06-29 NOTE — Assessment & Plan Note (Signed)
I discussed with him the importance of smoking cessation. 

## 2015-06-29 NOTE — Assessment & Plan Note (Signed)
Blood pressure is reasonably controlled on current medications. 

## 2015-06-29 NOTE — Progress Notes (Signed)
HPI  This is a 64 year old man who is here today for a followup visit . He has known history of hypertension, hyperlipidemia, obesity, COPD, chronic back pain and tobacco use.  He does have strong family history of coronary artery disease. Both parents had CABG in their early 42s. Previous cardiac testing in 2014 included a nuclear stress test which showed no evidence of ischemia with normal ejection fraction. Echocardiogram was also unremarkable except for mild grade 1 diastolic dysfunction without significant pulmonary hypertension.  He now returns and reports significant worsening of exertional dyspnea which is currently happening with minimal activities. He continues to smoke one pack per day. No chest discomfort. No orthopnea, PND or lower extremity edema.   No Known Allergies   Current Outpatient Prescriptions on File Prior to Visit  Medication Sig Dispense Refill  . aspirin EC 81 MG tablet Take 81 mg by mouth daily.    [name] atorvastatin (LIPITOR) 10 MG tablet TAKE 1 TABLET BY MOUTH ONCE A DAY 30 tablet 6  . budesonide-formoterol (SYMBICORT) 80-4.5 MCG/ACT inhaler Inhale 2 puffs into the lungs daily as needed (shortness of breath).    [name] lisinopril (PRINIVIL,ZESTRIL) 10 MG tablet TAKE ONE TABLET BY MOUTH EVERY DAY 30 tablet 6  . OVER THE COUNTER MEDICATION Take 1 capsule by mouth daily. Young [employer] B    . OVER THE COUNTER MEDICATION Take 1 capsule by mouth daily. [employer]ur[employer] (for healthy immune system and to boost liver function    . tadalafil (CIALIS) 20 MG tablet TAKE 1 TABLET ONCE A DAY AS NEEDED 8 tablet 3   No current facility-administered medications on file prior to visit.     Past Medical History  Diagnosis Date  . Allergic rhinitis 07/1998  . COPD (chronic obstructive pulmonary disease) ([place]) 07/1998    w ongoing tobacco use, spirometry (02/2013)  . Hyperlipidemia 06/1996  . HTN (hypertension) 05/1998  . ETOH abuse   . Smoker   . Hepatitis     pos  test once not now  . Lumbar spinal stenosis 03/2013    s/p laminectomy  . Dysrhythmia     PVC'S  . Shortness of breath dyspnea     WITH EXERTION  . 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 Arthritis     BACK AND RIGHT KNEE     Past Surgical History  Procedure Laterality Date  . Back surgery  2007  . Neck fusion  2003  . Colonoscopy  09/2010    polyps, diverticula, hemorrhoids.  Rpt 5 yrs  . Ett  2010    normal, ABIs normal  . US echocardiography  01/2013    WNL, mild diastolic dysfunction  . Spriometry  02/2013    COPD with some restriction thought to be obesity related ([place])  . Shoulder arthroscopy Right 11  . Knee arthroscopy Right 11    cartlage, ligaments  . Knee arthroscopy Left 84  . Lumbar laminectomy/decompression microdiscectomy Left 03/27/2013    Procedure: LUMBAR LAMINECTOMY/DECOMPRESSION MICRODISCECTOMY 2 LEVELS;  Surgeon: Ophelia Charter, MD;  Location: [facility];  Service: Neurosurgery;  Laterality: Left;  LEFT Lumbar Three Four diskectomy with Lumbar Three-Four, Four-Five Laminectomy. L3/4 ahd L4/5 for LSS [name])   . Total knee arthroplasty Right 06/27/2014    Procedure: RIGHT TOTAL KNEE ARTHROPLASTY;  Surgeon: Mcarthur Rossetti, MD;  Location: [facility];  Service: Orthopedics;  Laterality: Right;     Family History  Problem Relation  Age of Onset  . Coronary artery disease Mother   . Hypertension Mother   . Stroke Mother   . Coronary artery disease Father   . Hypertension Father   . Diabetes Father   . Stroke Father   . Pneumonia Sister   . Irregular heart beat Brother   . Maple syrup urine disease Brother   . Cancer Neg Hx      Social History   Social History  . Marital Status: Married    Spouse Name: N/A  . Number of Children: N/A  . Years of Education: N/A   Occupational History  . Not on file.   Social History Main Topics  . Smoking status: Current Every Day Smoker -- 0.50 packs/day for 40 years    Types:  Cigarettes  . Smokeless tobacco: Never Used     Comment: 1 ppd X 30 years  . Alcohol Use: 33.6 oz/week    56 Cans of beer per week     Comment: 8 beers/day  . Drug Use: No  . Sexual Activity: Not on file   Other Topics Concern  . Not on file   Social History Narrative   Lives with wife. Has 1 son, 2 daughters.    Occupation: Psychologist, clinical.    Activity: no exercise   Diet: good water, fruits/vegetables daily      Screened positive for OSA at recent hospitalization (03/2013)       PHYSICAL EXAM   BP 140/68 mmHg  Pulse 83  Ht 5\' 7"  (1.702 m)  Wt 239 lb 8 oz (108.636 kg)  BMI 37.50 kg/m2 Constitutional: He is oriented to person, place, and time. He appears well-developed and well-nourished. No distress.  HENT: No nasal discharge.  Head: Normocephalic and atraumatic.  Eyes: Pupils are equal and round. Right eye exhibits no discharge. Left eye exhibits no discharge.  Neck: Normal range of motion. Neck supple. No JVD present. No thyromegaly present.  Cardiovascular: Normal rate, regular rhythm, normal heart sounds and. Exam reveals no gallop and no friction rub. No murmur heard.  Pulmonary/Chest: Effort normal and breath sounds normal. No stridor. No respiratory distress. He has no wheezes. He has no rales. He exhibits no tenderness.  Abdominal: Soft. Bowel sounds are normal. He exhibits no distension. There is no tenderness. There is no rebound and no guarding.  Musculoskeletal: Normal range of motion. He exhibits trace edema and no tenderness.  Neurological: He is alert and oriented to person, place, and time. Coordination normal.  Skin: Skin is warm and dry. No rash noted. He is not diaphoretic. No erythema. No pallor.  Psychiatric: He has a normal mood and affect. His behavior is normal. Judgment and thought content normal.   EKG: Normal sinus rhythm with no significant ST or T wave changes.  ASSESSMENT AND PLAN

## 2015-06-30 ENCOUNTER — Telehealth: Payer: Self-pay

## 2015-06-30 NOTE — Telephone Encounter (Signed)
Due to a schedule change tomorrow at [facility] the pt's procedure time was changed to 2:00 with Dr Fletcher Anon.  The pt should arrive at 12:00 for procedure and he can have a clear liquid breakfast until 7 AM and then nothing by mouth, no solid food after midnight.  I left this pt a detailed message on his mobile phone and I advised him to contact the office with any other questions or concerns.

## 2015-06-30 NOTE — Telephone Encounter (Signed)
S/w pt to confirm receipt of message from Theodosia Quay, RN of time change for cath.  Reviewed instructions and pt understands 12noon arrival time. Pt had no further questions.

## 2015-07-01 ENCOUNTER — Encounter (HOSPITAL_COMMUNITY)
Admission: RE | Disposition: A | Discharge status: Home / Self Care | Payer: Self-pay | Source: Ambulatory Visit | Attending: Cardiovascular Disease

## 2015-07-01 ENCOUNTER — Ambulatory Visit (HOSPITAL_COMMUNITY)
Admission: RE | Admit: 2015-07-01 | Discharge: 2015-07-01 | Disposition: A | Discharge status: Home / Self Care | Payer: BLUE CROSS/BLUE SHIELD | Source: Ambulatory Visit | Attending: Cardiovascular Disease | Admitting: Cardiovascular Disease

## 2015-07-01 DIAGNOSIS — G8929 Other chronic pain: Secondary | ICD-10-CM | POA: Insufficient documentation

## 2015-07-01 DIAGNOSIS — E669 Obesity, unspecified: Secondary | ICD-10-CM | POA: Insufficient documentation

## 2015-07-01 DIAGNOSIS — Z8249 Family history of ischemic heart disease and other diseases of the circulatory system: Secondary | ICD-10-CM | POA: Diagnosis not present

## 2015-07-01 DIAGNOSIS — J449 Chronic obstructive pulmonary disease, unspecified: Secondary | ICD-10-CM | POA: Diagnosis not present

## 2015-07-01 DIAGNOSIS — Z7951 Long term (current) use of inhaled steroids: Secondary | ICD-10-CM | POA: Insufficient documentation

## 2015-07-01 DIAGNOSIS — I2510 Atherosclerotic heart disease of native coronary artery without angina pectoris: Secondary | ICD-10-CM

## 2015-07-01 DIAGNOSIS — M1990 Unspecified osteoarthritis, unspecified site: Secondary | ICD-10-CM | POA: Diagnosis not present

## 2015-07-01 DIAGNOSIS — I10 Essential (primary) hypertension: Secondary | ICD-10-CM | POA: Diagnosis not present

## 2015-07-01 DIAGNOSIS — F17210 Nicotine dependence, cigarettes, uncomplicated: Secondary | ICD-10-CM | POA: Insufficient documentation

## 2015-07-01 DIAGNOSIS — R0609 Other forms of dyspnea: Secondary | ICD-10-CM | POA: Diagnosis present

## 2015-07-01 DIAGNOSIS — E785 Hyperlipidemia, unspecified: Secondary | ICD-10-CM | POA: Diagnosis not present

## 2015-07-01 DIAGNOSIS — R0600 Dyspnea, unspecified: Secondary | ICD-10-CM

## 2015-07-01 DIAGNOSIS — Z6837 Body mass index (BMI) 37.0-37.9, adult: Secondary | ICD-10-CM | POA: Insufficient documentation

## 2015-07-01 DIAGNOSIS — M549 Dorsalgia, unspecified: Secondary | ICD-10-CM | POA: Diagnosis not present

## 2015-07-01 DIAGNOSIS — Z7982 Long term (current) use of aspirin: Secondary | ICD-10-CM | POA: Diagnosis not present

## 2015-07-01 DIAGNOSIS — G4733 Obstructive sleep apnea (adult) (pediatric): Secondary | ICD-10-CM | POA: Insufficient documentation

## 2015-07-01 HISTORY — PX: CARDIAC CATHETERIZATION: SHX172

## 2015-07-01 HISTORY — DX: Atherosclerotic heart disease of native coronary artery without angina pectoris: I25.10

## 2015-07-01 SURGERY — LEFT HEART CATH AND CORONARY ANGIOGRAPHY

## 2015-07-01 MED ORDER — FENTANYL CITRATE (PF) 100 MCG/2ML IJ SOLN
INTRAMUSCULAR | Status: DC | PRN
  Administered 2015-07-01: 50 ug via INTRAVENOUS

## 2015-07-01 MED ORDER — HEPARIN SODIUM (PORCINE) 1000 UNIT/ML IJ SOLN
INTRAMUSCULAR | Status: AC
  Filled 2015-07-01: qty 1

## 2015-07-01 MED ORDER — SODIUM CHLORIDE 0.9 % IV SOLN: INTRAVENOUS | Status: DC

## 2015-07-01 MED ORDER — SODIUM CHLORIDE 0.9 % IJ SOLN: 3.0000 mL | INTRAMUSCULAR | Status: DC | PRN

## 2015-07-01 MED ORDER — SODIUM CHLORIDE 0.9 % WEIGHT BASED INFUSION
3.0000 mL/kg/h | INTRAVENOUS | Status: AC
  Administered 2015-07-01: 3 mL/kg/h via INTRAVENOUS

## 2015-07-01 MED ORDER — SODIUM CHLORIDE 0.9 % WEIGHT BASED INFUSION: 1.0000 mL/kg/h | INTRAVENOUS | Status: DC

## 2015-07-01 MED ORDER — VERAPAMIL HCL 2.5 MG/ML IV SOLN
INTRAVENOUS | Status: AC
  Filled 2015-07-01: qty 2

## 2015-07-01 MED ORDER — LIDOCAINE HCL (PF) 1 % IJ SOLN
INTRAMUSCULAR | Status: AC
  Filled 2015-07-01: qty 30

## 2015-07-01 MED ORDER — LIDOCAINE HCL (PF) 1 % IJ SOLN
INTRAMUSCULAR | Status: DC | PRN
  Administered 2015-07-01: 14:00:00

## 2015-07-01 MED ORDER — ASPIRIN 81 MG PO CHEW: 81.0000 mg | CHEWABLE_TABLET | ORAL | Status: DC

## 2015-07-01 MED ORDER — SODIUM CHLORIDE 0.9 % IV SOLN: 250.0000 mL | INTRAVENOUS | Status: DC | PRN

## 2015-07-01 MED ORDER — SODIUM CHLORIDE 0.9 % IJ SOLN: 3.0000 mL | Freq: Two times a day (BID) | INTRAMUSCULAR | Status: DC

## 2015-07-01 MED ORDER — FENTANYL CITRATE (PF) 100 MCG/2ML IJ SOLN
INTRAMUSCULAR | Status: AC
  Filled 2015-07-01: qty 2

## 2015-07-01 MED ORDER — MIDAZOLAM HCL 2 MG/2ML IJ SOLN
INTRAMUSCULAR | Status: DC | PRN
  Administered 2015-07-01: 1 mg via INTRAVENOUS

## 2015-07-01 MED ORDER — HEPARIN (PORCINE) IN NACL 2-0.9 UNIT/ML-% IJ SOLN
INTRAMUSCULAR | Status: AC
  Filled 2015-07-01: qty 1500

## 2015-07-01 MED ORDER — IOHEXOL 350 MG/ML SOLN
INTRAVENOUS | Status: DC | PRN
  Administered 2015-07-01: 85 mL via INTRACARDIAC

## 2015-07-01 MED ORDER — MIDAZOLAM HCL 2 MG/2ML IJ SOLN
INTRAMUSCULAR | Status: AC
  Filled 2015-07-01: qty 2

## 2015-07-01 MED ORDER — VERAPAMIL HCL 2.5 MG/ML IV SOLN
INTRAVENOUS | Status: DC | PRN
  Administered 2015-07-01: 14:00:00 via INTRA_ARTERIAL

## 2015-07-01 MED ORDER — HEPARIN SODIUM (PORCINE) 1000 UNIT/ML IJ SOLN
INTRAMUSCULAR | Status: DC | PRN
  Administered 2015-07-01: 5000 [IU] via INTRAVENOUS

## 2015-07-01 SURGICAL SUPPLY — 13 items
CATH INFINITI 5FR ANG PIGTAIL (CATHETERS) ×2 IMPLANT
CATH INFINITI 5FR MULTPACK ANG (CATHETERS) ×2 IMPLANT
CATH OPTITORQUE JACKY 4.0 5F (CATHETERS) ×2 IMPLANT
DEVICE RAD COMP TR BAND LRG (VASCULAR PRODUCTS) ×2 IMPLANT
GLIDESHEATH SLEND SS 6F .021 (SHEATH) ×2 IMPLANT
KIT HEART LEFT (KITS) ×2 IMPLANT
PACK CARDIAC CATHETERIZATION (CUSTOM PROCEDURE TRAY) ×2 IMPLANT
SHEATH PINNACLE 5F 10CM (SHEATH) IMPLANT
SYR MEDRAD MARK V 150ML (SYRINGE) ×2 IMPLANT
TRANSDUCER W/STOPCOCK (MISCELLANEOUS) ×2 IMPLANT
TUBING CIL FLEX 10 FLL-RA (TUBING) ×2 IMPLANT
WIRE EMERALD 3MM-J .035X150CM (WIRE) IMPLANT
WIRE SAFE-T 1.5MM-J .035X260CM (WIRE) ×2 IMPLANT

## 2015-07-01 NOTE — Research (Signed)
[place] Study Informed Consent   Subject Name: [name]  Subject met inclusion and exclusion criteria.  The informed consent form, study requirements and expectations were reviewed with the subject and questions and concerns were addressed prior to the signing of the consent form.  The subject verbalized understanding of the trial requirements.  The subject agreed to participate in the [place] trial and signed the informed consent on 07/01/15 at 1307.  The informed consent was obtained prior to performance of any protocol-specific procedures for the subject.  A copy of the signed informed consent was given to the subject and a copy was placed in the subject's medical record.  Blossom Hoops 07/01/2015, 1:53 PM

## 2015-07-01 NOTE — Discharge Instructions (Signed)
Radial Site Care °Refer to this sheet in the next few weeks. These instructions provide you with information about caring for yourself after your procedure. Your health care provider may also give you more specific instructions. Your treatment has been planned according to current medical practices, but problems sometimes occur. Call your health care provider if you have any problems or questions after your procedure. °WHAT TO EXPECT AFTER THE PROCEDURE °After your procedure, it is typical to have the following: °· Bruising at the radial site that usually fades within 1-2 weeks. °· Blood collecting in the tissue (hematoma) that may be painful to the touch. It should usually decrease in size and tenderness within 1-2 weeks. °HOME CARE INSTRUCTIONS °· Take medicines only as directed by your health care provider. °· You may shower 24-48 hours after the procedure or as directed by your health care provider. Remove the bandage (dressing) and gently wash the site with plain soap and water. Pat the area dry with a clean towel. Do not rub the site, because this may cause bleeding. °· Do not take baths, swim, or use a hot tub until your health care provider approves. °· Check your insertion site every day for redness, swelling, or drainage. °· Do not apply powder or lotion to the site. °· Do not flex or bend the affected arm for 24 hours or as directed by your health care provider. °· Do not push or pull heavy objects with the affected arm for 24 hours or as directed by your health care provider. °· Do not lift over 10 lb (4.5 kg) for 5 days after your procedure or as directed by your health care provider. °· Ask your health care provider when it is okay to: °¨ Return to work or school. °¨ Resume usual physical activities or sports. °¨ Resume sexual activity. °· Do not drive home if you are discharged the same day as the procedure. Have someone else drive you. °· You may drive 24 hours after the procedure unless otherwise  instructed by your health care provider. °· Do not operate machinery or power tools for 24 hours after the procedure. °· If your procedure was done as an outpatient procedure, which means that you went home the same day as your procedure, a responsible adult should be with you for the first 24 hours after you arrive home. °· Keep all follow-up visits as directed by your health care provider. This is important. °SEEK MEDICAL CARE IF: °· You have a fever. °· You have chills. °· You have increased bleeding from the radial site. Hold pressure on the site. °SEEK IMMEDIATE MEDICAL CARE IF: °· You have unusual pain at the radial site. °· You have redness, warmth, or swelling at the radial site. °· You have drainage (other than a small amount of blood on the dressing) from the radial site. °· The radial site is bleeding, and the bleeding does not stop after 30 minutes of holding steady pressure on the site. °· Your arm or hand becomes pale, cool, tingly, or numb. °  °This information is not intended to replace advice given to you by your health care provider. Make sure you discuss any questions you have with your health care provider. °  °Document Released: 08/06/2010 Document Revised: 07/25/2014 Document Reviewed: 01/20/2014 °Elsevier Interactive Patient Education ©2016 Elsevier Inc. ° °

## 2015-07-01 NOTE — Interval H&P Note (Signed)
History and Physical Interval Note:  07/01/2015 2:07 PM  [name]  has presented today for surgery, with the diagnosis of shortness of breath  The various methods of treatment have been discussed with the patient and family. After consideration of risks, benefits and other options for treatment, the patient has consented to  Procedure(s): Left Heart Cath and Coronary Angiography (N/A) as a surgical intervention .  The patient's history has been reviewed, patient examined, no change in status, stable for surgery.  I have reviewed the patient's chart and labs.  Questions were answered to the patient's satisfaction.     Kathlyn [place]

## 2015-07-01 NOTE — H&P (View-Only) (Signed)
HPI  This is a 64 year old man who is here today for a followup visit . He has known history of hypertension, hyperlipidemia, obesity, COPD, chronic back pain and tobacco use.  He does have strong family history of coronary artery disease. Both parents had CABG in their early 53s. Previous cardiac testing in 2014 included a nuclear stress test which showed no evidence of ischemia with normal ejection fraction. Echocardiogram was also unremarkable except for mild grade 1 diastolic dysfunction without significant pulmonary hypertension.  He now returns and reports significant worsening of exertional dyspnea which is currently happening with minimal activities. He continues to smoke one pack per day. No chest discomfort. No orthopnea, PND or lower extremity edema.   No Known Allergies   Current Outpatient Prescriptions on File Prior to Visit  Medication Sig Dispense Refill  . aspirin EC 81 MG tablet Take 81 mg by mouth daily.    [name] atorvastatin (LIPITOR) 10 MG tablet TAKE 1 TABLET BY MOUTH ONCE A DAY 30 tablet 6  . budesonide-formoterol (SYMBICORT) 80-4.5 MCG/ACT inhaler Inhale 2 puffs into the lungs daily as needed (shortness of breath).    [name] lisinopril (PRINIVIL,ZESTRIL) 10 MG tablet TAKE ONE TABLET BY MOUTH EVERY DAY 30 tablet 6  . OVER THE COUNTER MEDICATION Take 1 capsule by mouth daily. Young [employer] B    . OVER THE COUNTER MEDICATION Take 1 capsule by mouth daily. Young Living Sulfurzyme (for healthy immune system and to boost liver function    . tadalafil (CIALIS) 20 MG tablet TAKE 1 TABLET ONCE A DAY AS NEEDED 8 tablet 3   No current facility-administered medications on file prior to visit.     Past Medical History  Diagnosis Date  . Allergic rhinitis 07/1998  . COPD (chronic obstructive pulmonary disease) ([place]) 07/1998    w ongoing tobacco use, spirometry (02/2013)  . Hyperlipidemia 06/1996  . HTN (hypertension) 05/1998  . ETOH abuse   . Smoker   . Hepatitis     pos  test once not now  . Lumbar spinal stenosis 03/2013    s/p laminectomy  . Dysrhythmia     PVC'S  . Shortness of breath dyspnea     WITH EXERTION  . 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 Arthritis     BACK AND RIGHT KNEE     Past Surgical History  Procedure Laterality Date  . Back surgery  2007  . Neck fusion  2003  . Colonoscopy  09/2010    polyps, diverticula, hemorrhoids.  Rpt 5 yrs  . Ett  2010    normal, ABIs normal  . US echocardiography  01/2013    WNL, mild diastolic dysfunction  . Spriometry  02/2013    COPD with some restriction thought to be obesity related ([place])  . Shoulder arthroscopy Right 11  . Knee arthroscopy Right 11    cartlage, ligaments  . Knee arthroscopy Left 84  . Lumbar laminectomy/decompression microdiscectomy Left 03/27/2013    Procedure: LUMBAR LAMINECTOMY/DECOMPRESSION MICRODISCECTOMY 2 LEVELS;  Surgeon: Ophelia Charter, MD;  Location: [facility];  Service: Neurosurgery;  Laterality: Left;  LEFT Lumbar Three Four diskectomy with Lumbar Three-Four, Four-Five Laminectomy. L3/4 ahd L4/5 for LSS [name])   . Total knee arthroplasty Right 06/27/2014    Procedure: RIGHT TOTAL KNEE ARTHROPLASTY;  Surgeon: Mcarthur Rossetti, MD;  Location: [facility];  Service: Orthopedics;  Laterality: Right;     Family History  Problem Relation  Age of Onset  . Coronary artery disease Mother   . Hypertension Mother   . Stroke Mother   . Coronary artery disease Father   . Hypertension Father   . Diabetes Father   . Stroke Father   . Pneumonia Sister   . Irregular heart beat Brother   . Maple syrup urine disease Brother   . Cancer Neg Hx      Social History   Social History  . Marital Status: Married    Spouse Name: N/A  . Number of Children: N/A  . Years of Education: N/A   Occupational History  . Not on file.   Social History Main Topics  . Smoking status: Current Every Day Smoker -- 0.50 packs/day for 40 years    Types:  Cigarettes  . Smokeless tobacco: Never Used     Comment: 1 ppd X 30 years  . Alcohol Use: 33.6 oz/week    56 Cans of beer per week     Comment: 8 beers/day  . Drug Use: No  . Sexual Activity: Not on file   Other Topics Concern  . Not on file   Social History Narrative   Lives with wife. Has 1 son, 2 daughters.    Occupation: Psychologist, clinical.    Activity: no exercise   Diet: good water, fruits/vegetables daily      Screened positive for OSA at recent hospitalization (03/2013)       PHYSICAL EXAM   BP 140/68 mmHg  Pulse 83  Ht 5\' 7"  (1.702 m)  Wt 239 lb 8 oz (108.636 kg)  BMI 37.50 kg/m2 Constitutional: He is oriented to person, place, and time. He appears well-developed and well-nourished. No distress.  HENT: No nasal discharge.  Head: Normocephalic and atraumatic.  Eyes: Pupils are equal and round. Right eye exhibits no discharge. Left eye exhibits no discharge.  Neck: Normal range of motion. Neck supple. No JVD present. No thyromegaly present.  Cardiovascular: Normal rate, regular rhythm, normal heart sounds and. Exam reveals no gallop and no friction rub. No murmur heard.  Pulmonary/Chest: Effort normal and breath sounds normal. No stridor. No respiratory distress. He has no wheezes. He has no rales. He exhibits no tenderness.  Abdominal: Soft. Bowel sounds are normal. He exhibits no distension. There is no tenderness. There is no rebound and no guarding.  Musculoskeletal: Normal range of motion. He exhibits trace edema and no tenderness.  Neurological: He is alert and oriented to person, place, and time. Coordination normal.  Skin: Skin is warm and dry. No rash noted. He is not diaphoretic. No erythema. No pallor.  Psychiatric: He has a normal mood and affect. His behavior is normal. Judgment and thought content normal.   EKG: Normal sinus rhythm with no significant ST or T wave changes.  ASSESSMENT AND PLAN

## 2015-07-02 ENCOUNTER — Encounter (HOSPITAL_COMMUNITY): Payer: Self-pay | Admitting: Cardiovascular Disease

## 2015-07-02 ENCOUNTER — Ambulatory Visit: Payer: BLUE CROSS/BLUE SHIELD | Admitting: Pulmonary Disease

## 2015-07-14 ENCOUNTER — Other Ambulatory Visit: Payer: Self-pay | Admitting: Family Medicine

## 2015-08-07 ENCOUNTER — Ambulatory Visit: Payer: BLUE CROSS/BLUE SHIELD | Admitting: Nurse Practitioner

## 2015-08-17 ENCOUNTER — Other Ambulatory Visit: Payer: Self-pay | Admitting: Family Medicine

## 2015-08-19 DIAGNOSIS — R768 Other specified abnormal immunological findings in serum: Secondary | ICD-10-CM

## 2015-08-19 HISTORY — DX: Other specified abnormal immunological findings in serum: R76.8

## 2015-08-21 ENCOUNTER — Telehealth: Payer: Self-pay | Admitting: *Deleted

## 2015-08-21 MED ORDER — SILDENAFIL CITRATE 100 MG PO TABS: 50.0000 mg | ORAL_TABLET | Freq: Every day | ORAL | Status: DC | PRN

## 2015-08-21 NOTE — Telephone Encounter (Signed)
PA was required for cialis. Denied stating he must try and fail viagra. In your IN box for review.

## 2015-08-21 NOTE — Telephone Encounter (Signed)
Signed and in Kim's box.  plz notify. viagra sent in.

## 2015-08-24 NOTE — Telephone Encounter (Signed)
Patient notified and verbalized understanding. 

## 2015-09-04 ENCOUNTER — Other Ambulatory Visit: Payer: Self-pay | Admitting: Family Medicine

## 2015-09-04 ENCOUNTER — Other Ambulatory Visit (INDEPENDENT_AMBULATORY_CARE_PROVIDER_SITE_OTHER): Payer: BLUE CROSS/BLUE SHIELD

## 2015-09-04 DIAGNOSIS — E785 Hyperlipidemia, unspecified: Secondary | ICD-10-CM | POA: Diagnosis not present

## 2015-09-04 DIAGNOSIS — R7303 Prediabetes: Secondary | ICD-10-CM | POA: Diagnosis not present

## 2015-09-04 DIAGNOSIS — Z125 Encounter for screening for malignant neoplasm of prostate: Secondary | ICD-10-CM

## 2015-09-04 DIAGNOSIS — E559 Vitamin D deficiency, unspecified: Secondary | ICD-10-CM

## 2015-09-04 DIAGNOSIS — Z1159 Encounter for screening for other viral diseases: Secondary | ICD-10-CM

## 2015-09-04 DIAGNOSIS — I10 Essential (primary) hypertension: Secondary | ICD-10-CM

## 2015-09-04 DIAGNOSIS — F1010 Alcohol abuse, uncomplicated: Secondary | ICD-10-CM

## 2015-09-04 LAB — MICROALBUMIN / CREATININE URINE RATIO
Creatinine,U: 166.8 mg/dL
Microalb Creat Ratio: 1.3 mg/g (ref 0.0–30.0)
Microalb, Ur: 2.1 mg/dL — ABNORMAL HIGH (ref 0.0–1.9)

## 2015-09-04 LAB — HEPATIC FUNCTION PANEL
ALT: 31 U/L (ref 0–53)
AST: 17 U/L (ref 0–37)
Albumin: 4.3 g/dL (ref 3.5–5.2)
Alkaline Phosphatase: 77 U/L (ref 39–117)
Bilirubin, Direct: 0.2 mg/dL (ref 0.0–0.3)
Total Bilirubin: 0.6 mg/dL (ref 0.2–1.2)
Total Protein: 6.7 g/dL (ref 6.0–8.3)

## 2015-09-04 LAB — BASIC METABOLIC PANEL
BUN: 16 mg/dL (ref 6–23)
CO2: 30 mEq/L (ref 19–32)
Calcium: 9.5 mg/dL (ref 8.4–10.5)
Chloride: 99 mEq/L (ref 96–112)
Creatinine, Ser: 0.95 mg/dL (ref 0.40–1.50)
GFR: 84.78 mL/min (ref >60.00)
Glucose, Bld: 127 mg/dL — ABNORMAL HIGH (ref 70–99)
Potassium: 4.8 mEq/L (ref 3.5–5.1)
Sodium: 137 mEq/L (ref 135–145)

## 2015-09-04 LAB — LIPID PANEL
Cholesterol: 178 mg/dL (ref 0–200)
HDL: 74.4 mg/dL (ref >39.00)
LDL Cholesterol: 91 mg/dL (ref 0–99)
NonHDL: 104.06
Total CHOL/HDL Ratio: 2
Triglycerides: 65 mg/dL (ref 0.0–149.0)
VLDL: 13 mg/dL (ref 0.0–40.0)

## 2015-09-04 LAB — PSA: PSA: 0.92 ng/mL (ref 0.10–4.00)

## 2015-09-04 LAB — HEMOGLOBIN A1C: Hgb A1c MFr Bld: 6.5 % (ref 4.6–6.5)

## 2015-09-05 LAB — HEPATITIS C ANTIBODY: HCV Ab: REACTIVE — AB

## 2015-09-08 ENCOUNTER — Encounter: Payer: Self-pay | Admitting: Family Medicine

## 2015-09-08 ENCOUNTER — Ambulatory Visit (INDEPENDENT_AMBULATORY_CARE_PROVIDER_SITE_OTHER): Payer: BLUE CROSS/BLUE SHIELD | Admitting: Family Medicine

## 2015-09-08 VITALS — BP 140/80 | HR 82 | Temp 98.3°F | Ht 66.75 in | Wt 240.5 lb

## 2015-09-08 DIAGNOSIS — Z6835 Body mass index (BMI) 35.0-35.9, adult: Secondary | ICD-10-CM

## 2015-09-08 DIAGNOSIS — E119 Type 2 diabetes mellitus without complications: Secondary | ICD-10-CM

## 2015-09-08 DIAGNOSIS — F1010 Alcohol abuse, uncomplicated: Secondary | ICD-10-CM

## 2015-09-08 DIAGNOSIS — R599 Enlarged lymph nodes, unspecified: Secondary | ICD-10-CM

## 2015-09-08 DIAGNOSIS — Z0000 Encounter for general adult medical examination without abnormal findings: Secondary | ICD-10-CM | POA: Diagnosis not present

## 2015-09-08 DIAGNOSIS — F17200 Nicotine dependence, unspecified, uncomplicated: Secondary | ICD-10-CM

## 2015-09-08 DIAGNOSIS — Z7189 Other specified counseling: Secondary | ICD-10-CM

## 2015-09-08 DIAGNOSIS — I739 Peripheral vascular disease, unspecified: Secondary | ICD-10-CM

## 2015-09-08 DIAGNOSIS — J449 Chronic obstructive pulmonary disease, unspecified: Secondary | ICD-10-CM

## 2015-09-08 DIAGNOSIS — R590 Localized enlarged lymph nodes: Secondary | ICD-10-CM

## 2015-09-08 DIAGNOSIS — E785 Hyperlipidemia, unspecified: Secondary | ICD-10-CM

## 2015-09-08 DIAGNOSIS — I10 Essential (primary) hypertension: Secondary | ICD-10-CM

## 2015-09-08 LAB — CBC WITH DIFFERENTIAL/PLATELET
Basophils Absolute: 0 10*3/uL (ref 0.0–0.1)
Basophils Relative: 0.2 % (ref 0.0–3.0)
Eosinophils Absolute: 0.1 10*3/uL (ref 0.0–0.7)
Eosinophils Relative: 1.9 % (ref 0.0–5.0)
HCT: 41.7 % (ref 39.0–52.0)
Hemoglobin: 13.9 g/dL (ref 13.0–17.0)
Lymphocytes Relative: 32.5 % (ref 12.0–46.0)
Lymphs Abs: 2.4 10*3/uL (ref 0.7–4.0)
MCHC: 33.4 g/dL (ref 30.0–36.0)
MCV: 95.6 fl (ref 78.0–100.0)
Monocytes Absolute: 0.6 10*3/uL (ref 0.1–1.0)
Monocytes Relative: 7.7 % (ref 3.0–12.0)
Neutro Abs: 4.3 10*3/uL (ref 1.4–7.7)
Neutrophils Relative %: 57.7 % (ref 43.0–77.0)
Platelets: 249 10*3/uL (ref 150.0–400.0)
RBC: 4.36 Mil/uL (ref 4.22–5.81)
RDW: 13.4 % (ref 11.5–15.5)
WBC: 7.5 10*3/uL (ref 4.0–10.5)

## 2015-09-08 LAB — HEPATITIS C RNA QUANTITATIVE: HCV Quantitative: NOT DETECTED IU/mL (ref <15)

## 2015-09-08 LAB — SEDIMENTATION RATE: Sed Rate: 10 mm/hr (ref 0–22)

## 2015-09-08 MED ORDER — LISINOPRIL 10 MG PO TABS: 10.0000 mg | ORAL_TABLET | Freq: Every day | ORAL | Status: DC

## 2015-09-08 MED ORDER — ATORVASTATIN CALCIUM 10 MG PO TABS: 10.0000 mg | ORAL_TABLET | Freq: Every day | ORAL | Status: DC

## 2015-09-08 NOTE — Assessment & Plan Note (Signed)
Discussed healthy diet and lifestyle changes to affect sustainable weight loss  

## 2015-09-08 NOTE — Assessment & Plan Note (Signed)
New, bilateral. Check CBC, ESR today.

## 2015-09-08 NOTE — Assessment & Plan Note (Signed)
Reviewed, encouraged decreased use. Denies trouble with drinking.

## 2015-09-08 NOTE — Progress Notes (Signed)
Pre visit review using our clinic review tool, if applicable. No additional management support is needed unless otherwise documented below in the visit note. 

## 2015-09-08 NOTE — Patient Instructions (Addendum)
labwork today. Try nicotine gum. We will refer you for lung cancer screening CT. If you haven't received letter from GI in next 1-2 months, call us or them to ask about colonoscopy.  Bring me copy of advanced directives. Goal activity is 144min/wk of moderate intensity exercise.  This can be split into 30 minute chunks.  If you are not at this level, you can start with smaller 10-15 min increments and slowly build up activity. Look at [place]org for more resources Work on weight loss over the next year.  Look into mediterranean diet.  Return in 6 months for follow up ,prior for labs.      Mediterranean Diet  Why follow it? Research shows. . Those who follow the Mediterranean diet have a reduced risk of heart disease  . The diet is associated with a reduced incidence of Parkinson's and Alzheimer's diseases . People following the diet may have longer life expectancies and lower rates of chronic diseases  . The Dietary Guidelines for Americans recommends the Mediterranean diet as an eating plan to promote health and prevent disease  What Is the Mediterranean Diet?  . Healthy eating plan based on typical foods and recipes of Mediterranean-style cooking . The diet is primarily a plant based diet; these foods should make up a majority of meals   Starches - Plant based foods should make up a majority of meals - They are an important sources of vitamins, minerals, energy, antioxidants, and fiber - Choose whole grains, foods high in fiber and minimally processed items  - Typical grain sources include wheat, oats, barley, corn, brown rice, bulgar, farro, millet, polenta, couscous  - Various types of beans include chickpeas, lentils, fava beans, black beans, white beans   Fruits  Veggies - Large quantities of antioxidant rich fruits & veggies; 6 or more servings  - Vegetables can be eaten raw or lightly drizzled with oil and cooked  - Vegetables common to the traditional Mediterranean Diet  include: artichokes, arugula, beets, broccoli, brussel sprouts, cabbage, carrots, celery, collard greens, cucumbers, eggplant, kale, leeks, lemons, lettuce, mushrooms, okra, onions, peas, peppers, potatoes, pumpkin, radishes, rutabaga, shallots, spinach, sweet potatoes, turnips, zucchini - Fruits common to the Mediterranean Diet include: apples, apricots, avocados, cherries, clementines, dates, figs, grapefruits, grapes, melons, nectarines, oranges, peaches, pears, pomegranates, strawberries, tangerines  Fats - Replace butter and margarine with healthy oils, such as olive oil, canola oil, and tahini  - Limit nuts to no more than a handful a day  - Nuts include walnuts, almonds, pecans, pistachios, pine nuts  - Limit or avoid candied, honey roasted or heavily salted nuts - Olives are central to the [employer] - can be eaten whole or used in a variety of dishes   Meats Protein - Limiting red meat: no more than a few times a month - When eating red meat: choose lean cuts and keep the portion to the size of deck of cards - Eggs: approx. 0 to 4 times a week  - Fish and lean poultry: at least 2 a week  - Healthy protein sources include, chicken, [place], lean beef, lamb - Increase intake of seafood such as tuna, salmon, trout, mackerel, shrimp, scallops - Avoid or limit high fat processed meats such as sausage and bacon  Dairy - Include moderate amounts of low fat dairy products  - Focus on healthy dairy such as fat free yogurt, skim milk, low or reduced fat cheese - Limit dairy products higher in fat such as whole or  2% milk, cheese, ice cream  Alcohol - Moderate amounts of red wine is ok  - No more than 5 oz daily for women (all ages) and men older than age 52  - No more than 10 oz of wine daily for men younger than 60  Other - Limit sweets and other desserts  - Use herbs and spices instead of salt to flavor foods  - Herbs and spices common to the traditional Mediterranean Diet include:  basil, bay leaves, chives, cloves, cumin, fennel, garlic, lavender, marjoram, mint, oregano, parsley, pepper, rosemary, sage, savory, sumac, tarragon, thyme   It's not just a diet, it's a lifestyle:  . The Mediterranean diet includes lifestyle factors typical of those in the region  . Foods, drinks and meals are best eaten with others and savored . Daily physical activity is important for overall good health . This could be strenuous exercise like running and aerobics . This could also be more leisurely activities such as walking, housework, yard-work, or taking the stairs . Moderation is the key; a balanced and healthy diet accommodates most foods and drinks . Consider portion sizes and frequency of consumption of certain foods   Meal Ideas & Options:  . Breakfast:  o Whole wheat toast or whole wheat English muffins with peanut butter & hard boiled egg o Steel cut oats topped with apples & cinnamon and skim milk  o Fresh fruit: banana, strawberries, melon, berries, peaches  o Smoothies: strawberries, bananas, greek yogurt, peanut butter o Low fat greek yogurt with blueberries and granola  o Egg white omelet with spinach and mushrooms o Breakfast couscous: whole wheat couscous, apricots, skim milk, cranberries  . Sandwiches:  o Hummus and grilled vegetables (peppers, zucchini, squash) on whole wheat bread   o Grilled chicken on whole wheat pita with lettuce, tomatoes, cucumbers or tzatziki  o Tuna salad on whole wheat bread: tuna salad made with greek yogurt, olives, red peppers, capers, green onions o Garlic rosemary lamb pita: lamb sauted with garlic, rosemary, salt & pepper; add lettuce, cucumber, greek yogurt to pita - flavor with lemon juice and black pepper  . Seafood:  o Mediterranean grilled salmon, seasoned with garlic, basil, parsley, lemon juice and black pepper o Shrimp, lemon, and spinach whole-grain pasta salad made with low fat greek yogurt  o Seared scallops with lemon  orzo  o Seared tuna steaks seasoned salt, pepper, coriander topped with tomato mixture of olives, tomatoes, olive oil, minced garlic, parsley, green onions and cappers  . Meats:  o Herbed greek chicken salad with kalamata olives, cucumber, feta  o Red bell peppers stuffed with spinach, bulgur, lean ground beef (or lentils) & topped with feta   o Kebabs: skewers of chicken, tomatoes, onions, zucchini, squash  o [place] burgers: made with red onions, mint, dill, lemon juice, feta cheese topped with roasted red peppers . Vegetarian o Cucumber salad: cucumbers, artichoke hearts, celery, red onion, feta cheese, tossed in olive oil & lemon juice  o Hummus and whole grain pita points with a greek salad (lettuce, tomato, feta, olives, cucumbers, red onion) o Lentil soup with celery, carrots made with vegetable broth, garlic, salt and pepper  o Tabouli salad: parsley, bulgur, mint, scallions, cucumbers, tomato, radishes, lemon juice, olive oil, salt and pepper.

## 2015-09-08 NOTE — Assessment & Plan Note (Signed)
Chronic, stable. Continue current regimen. 

## 2015-09-08 NOTE — Assessment & Plan Note (Signed)
Preventative protocols reviewed and updated unless pt declined. Discussed healthy diet and lifestyle.  

## 2015-09-08 NOTE — Assessment & Plan Note (Signed)
Continue to encourage cessation. Pending pulm f/u ([facility])

## 2015-09-08 NOTE — Progress Notes (Signed)
BP 140/80 mmHg  Pulse 82  Temp(Src) 98.3 F (36.8 C) (Oral)  Ht 5' 6.75" (1.695 m)  Wt 240 lb 8 oz (109.09 kg)  BMI 37.97 kg/m2  SpO2 95%   CC: CPE  Subjective:    Patient ID: [name], male    DOB: 08/06/1950, 65 y.o.   MRN: [id number]  HPI: [name] is a 65 y.o. male presenting on 09/08/2015 for Annual Exam   Mild COPD on symbicort, continued smoker 1/2 ppd.  Mild CAD - LV systolic function normal. LAD 30%, RCA 20%. Saw Dr Fletcher Anon, rec f/u yearly. Noticing exertional dyspnea, fatigue. 25 lb weight gain noted over the past year.   Alcohol use - 6-8 beers/day. No eye opener. No hangover. No legal trouble from drinking.  Smoking - tried chantix in the past - caused headaches. Tried gum which was helpful.   Preventative: Colonoscopy 09/23/2010 - polyps, diverticula, hemorrhoids. Rpt 5 yrs [place]).  Prostate - normal DRE/PSA in past. Nocturia x3-4. Strong stream. Q11yrchecks last 2016. Lung cancer screening - discussed - interested. Will refer to lung cancer screening nurse. Flu shot - 03/2015 Tdap 08/2012  Pneumovax - 08/2013 zostavax - 09/2013 Advanced directives: has at home. HCPOA is daughter, CDonella Stade [place] bring [place]  Lives with wife. Has 1 son, 2 daughters.  Occupation: MPsychologist, clinical  Activity: no regular exercise.  Diet: good water, fruits/vegetables daily  Relevant past medical, surgical, family and social history reviewed and updated as indicated. Interim medical history since our last visit reviewed. Allergies and medications reviewed and updated. Current Outpatient Prescriptions on File Prior to Visit  Medication Sig  . aspirin EC 81 MG tablet Take 81 mg by mouth daily.  .[name] KitchenOVER THE COUNTER MEDICATION Take 1 capsule by mouth daily. Young [employer]  . OVER THE COUNTER MEDICATION Take 1 capsule by mouth daily. [employer] Sulfurzyme (for healthy immune system and to boost liver function  . sildenafil (VIAGRA) 100 MG tablet Take 0.5-1  tablets (50-100 mg total) by mouth daily as needed for erectile dysfunction.  . SYMBICORT 80-4.5 MCG/ACT inhaler INHALE 2 PUFFS INTO THE LUNGS DAILY AS NEEDED   No current facility-administered medications on file prior to visit.    Review of Systems  Constitutional: Negative for fever, chills, activity change, appetite change, fatigue and unexpected weight change.  HENT: Negative for hearing loss.   Eyes: Negative for visual disturbance.  Respiratory: Positive for cough (smoker's) and shortness of breath (weight related). Negative for chest tightness and wheezing.   Cardiovascular: Negative for chest pain, palpitations and leg swelling.  Gastrointestinal: Negative for nausea, vomiting, abdominal pain, diarrhea, constipation, blood in stool and abdominal distention.  Genitourinary: Negative for hematuria and difficulty urinating.  Musculoskeletal: Negative for myalgias, arthralgias and neck pain.  Skin: Negative for rash.  Neurological: Negative for dizziness, seizures, syncope and headaches.  Hematological: Negative for adenopathy. Does not bruise/bleed easily.  Psychiatric/Behavioral: Negative for dysphoric mood. The patient is not nervous/anxious.    Per HPI unless specifically indicated in ROS section     Objective:    BP 140/80 mmHg  Pulse 82  Temp(Src) 98.3 F (36.8 C) (Oral)  Ht 5' 6.75" (1.695 m)  Wt 240 lb 8 oz (109.09 kg)  BMI 37.97 kg/m2  SpO2 95%  Wt Readings from Last 3 Encounters:  09/08/15 240 lb 8 oz (109.09 kg)  07/01/15 230 lb (104.327 kg)  06/29/15 239 lb 8 oz (108.636 kg)    Physical Exam  Constitutional: He is oriented to person, place, and time. He appears well-developed and well-nourished. No distress.  HENT:  Head: Normocephalic and atraumatic.  Right Ear: Hearing, tympanic membrane, external ear and ear canal normal.  Left Ear: Hearing, tympanic membrane, external ear and ear canal normal.  Nose: Nose normal.  Mouth/Throat: Uvula is midline,  oropharynx is clear and moist and mucous membranes are normal. No oropharyngeal exudate, posterior oropharyngeal edema or posterior oropharyngeal erythema.  Eyes: Conjunctivae and EOM are normal. Pupils are equal, round, and reactive to light. No scleral icterus.  Neck: Normal range of motion. Neck supple. No thyromegaly present.  Cardiovascular: Normal rate, regular rhythm, normal heart sounds and intact distal pulses.   No murmur heard. Pulses:      Radial pulses are 2+ on the right side, and 2+ on the left side.  Pulmonary/Chest: Effort normal and breath sounds normal. No respiratory distress. He has no wheezes. He has no rales.  Abdominal: Soft. Bowel sounds are normal. He exhibits no distension and no mass. There is no tenderness. There is no rebound and no guarding.  Obese. No HSM.  Musculoskeletal: Normal range of motion. He exhibits no edema.  Lymphadenopathy:       Head (right side): No submental, no submandibular, no tonsillar, no preauricular, no posterior auricular and no occipital adenopathy present.       Head (left side): No submental, no submandibular, no tonsillar, no preauricular, no posterior auricular and no occipital adenopathy present.    He has no cervical adenopathy.       Right cervical: No posterior cervical adenopathy present.      Left cervical: No posterior cervical adenopathy present.    He has no axillary adenopathy.       Right axillary: No lateral adenopathy present.       Left axillary: No lateral adenopathy present.      Right: Supraclavicular adenopathy present. No inguinal adenopathy present.       Left: Supraclavicular adenopathy present. No inguinal adenopathy present.  1cm rubbery LAD bilateral supraclavicular area  Neurological: He is alert and oriented to person, place, and time.  CN grossly intact, station and gait intact  Skin: Skin is warm and dry. No rash noted.  Psychiatric: He has a normal mood and affect. His behavior is normal. Judgment and  thought content normal.  Nursing note and vitals reviewed.  Results for orders placed or performed in visit on 09/04/15  PSA  Result Value Ref Range   PSA 0.92 0.10 - 4.00 ng/mL  Lipid panel  Result Value Ref Range   Cholesterol 178 0 - 200 mg/dL   Triglycerides 65.0 0.0 - 149.0 mg/dL   HDL 74.40 >39.00 mg/dL   VLDL 13.0 0.0 - 40.0 mg/dL   LDL Cholesterol 91 0 - 99 mg/dL   Total CHOL/HDL Ratio 2    NonHDL 104.06   Hemoglobin A1c  Result Value Ref Range   Hgb A1c MFr Bld 6.5 4.6 - 6.5 %  Basic metabolic panel  Result Value Ref Range   Sodium 137 135 - 145 mEq/L   Potassium 4.8 3.5 - 5.1 mEq/L   Chloride 99 96 - 112 mEq/L   CO2 30 19 - 32 mEq/L   Glucose, Bld 127 (H) 70 - 99 mg/dL   BUN 16 6 - 23 mg/dL   Creatinine, Ser 0.95 0.40 - 1.50 mg/dL   Calcium 9.5 8.4 - 10.5 mg/dL   GFR 84.78 >60.00 mL/min  Microalbumin / creatinine urine ratio  Result Value Ref Range   Microalb, Ur 2.1 (H) 0.0 - 1.9 mg/dL   Creatinine,U 166.8 mg/dL   Microalb Creat Ratio 1.3 0.0 - 30.0 mg/g  Hepatic function panel  Result Value Ref Range   Total Bilirubin 0.6 0.2 - 1.2 mg/dL   Bilirubin, Direct 0.2 0.0 - 0.3 mg/dL   Alkaline Phosphatase 77 39 - 117 U/L   AST 17 0 - 37 U/L   ALT 31 0 - 53 U/L   Total Protein 6.7 6.0 - 8.3 g/dL   Albumin 4.3 3.5 - 5.2 g/dL      Assessment & Plan:   Problem List Items Addressed This Visit    Supraclavicular lymphadenopathy    New, bilateral. Check CBC, ESR today.      Relevant Orders   CBC with Differential/Platelet   Sedimentation rate   Smoker    Continue to encourage smoking cessation. Refer for lung cancer screening CT scan. rec start nicotine gum.      Relevant Orders   Ambulatory Referral for Lung Cancer Scre   PVD   Relevant Medications   atorvastatin (LIPITOR) 10 MG tablet   lisinopril (PRINIVIL,ZESTRIL) 10 MG tablet   HLD (hyperlipidemia)    Chronic, stable. Continue lipitor.       Relevant Medications   atorvastatin (LIPITOR) 10  MG tablet   lisinopril (PRINIVIL,ZESTRIL) 10 MG tablet   Health maintenance examination - Primary    Preventative protocols reviewed and updated unless pt declined. Discussed healthy diet and lifestyle.       Essential hypertension    Chronic, stable. Continue current regimen.      Relevant Medications   atorvastatin (LIPITOR) 10 MG tablet   lisinopril (PRINIVIL,ZESTRIL) 10 MG tablet   Diet-controlled diabetes mellitus ([facility])    A1c 6.5% - reviewed #s and significance with patient. rec weight loss to better control diabetes.      Relevant Medications   atorvastatin (LIPITOR) 10 MG tablet   lisinopril (PRINIVIL,ZESTRIL) 10 MG tablet   COPD (chronic obstructive pulmonary disease) ([facility])    Continue to encourage cessation. Pending pulm f/u ([facility])      Body mass index (BMI) of 35.0 to 35.9 with comorbidity ([place])    Discussed healthy diet and lifestyle changes to affect sustainable weight loss      Alcohol abuse    Reviewed, encouraged decreased use. Denies trouble with drinking.       Advanced care planning/counseling discussion    Advanced directives: has at home. HCPOA is daughter, [name]. Will bring [place] copy          Follow up plan: Return in about 6 months (around 03/07/2016), or as needed, for follow up visit.

## 2015-09-08 NOTE — Assessment & Plan Note (Signed)
Advanced directives: has at home. HCPOA is daughter, Crystal. Will bring us copy.  

## 2015-09-08 NOTE — Assessment & Plan Note (Signed)
Chronic, stable. Continue lipitor.  

## 2015-09-08 NOTE — Assessment & Plan Note (Addendum)
Continue to encourage smoking cessation. Refer for lung cancer screening CT scan. rec start nicotine gum.

## 2015-09-08 NOTE — Assessment & Plan Note (Signed)
A1c 6.5% - reviewed #s and significance with patient. rec weight loss to better control diabetes.

## 2015-09-11 ENCOUNTER — Telehealth: Payer: Self-pay | Admitting: Family Medicine

## 2015-09-11 ENCOUNTER — Encounter: Payer: Self-pay | Admitting: Family Medicine

## 2015-09-11 NOTE — Telephone Encounter (Signed)
Patient is calling to get lab results

## 2015-09-11 NOTE — Telephone Encounter (Signed)
Spoke with patient.

## 2015-09-14 ENCOUNTER — Telehealth: Payer: Self-pay | Admitting: *Deleted

## 2015-09-14 NOTE — Telephone Encounter (Signed)
Received referral for low dose lung cancer screening CT scan from Dr. Danise Mina . Voicemail left at phone number listed in EMR for patient to call me back to facilitate scheduling scan.

## 2015-09-25 ENCOUNTER — Encounter: Payer: Self-pay | Admitting: *Deleted

## 2015-09-25 ENCOUNTER — Other Ambulatory Visit: Payer: Self-pay | Admitting: Acute Care

## 2015-09-25 DIAGNOSIS — F17210 Nicotine dependence, cigarettes, uncomplicated: Secondary | ICD-10-CM

## 2015-09-30 ENCOUNTER — Encounter: Payer: Self-pay | Admitting: Pulmonary Disease

## 2015-09-30 ENCOUNTER — Ambulatory Visit (INDEPENDENT_AMBULATORY_CARE_PROVIDER_SITE_OTHER): Payer: BLUE CROSS/BLUE SHIELD | Admitting: Pulmonary Disease

## 2015-09-30 VITALS — BP 152/88 | HR 97 | Ht 66.75 in | Wt 247.0 lb

## 2015-09-30 DIAGNOSIS — J449 Chronic obstructive pulmonary disease, unspecified: Secondary | ICD-10-CM | POA: Diagnosis not present

## 2015-09-30 DIAGNOSIS — R0600 Dyspnea, unspecified: Secondary | ICD-10-CM | POA: Diagnosis not present

## 2015-09-30 DIAGNOSIS — Z720 Tobacco use: Secondary | ICD-10-CM | POA: Diagnosis not present

## 2015-09-30 MED ORDER — TIOTROPIUM BROMIDE MONOHYDRATE 18 MCG IN CAPS: 18.0000 ug | ORAL_CAPSULE | Freq: Every day | RESPIRATORY_TRACT | Status: DC

## 2015-09-30 MED ORDER — ALBUTEROL SULFATE HFA 108 (90 BASE) MCG/ACT IN AERS: 2.0000 | INHALATION_SPRAY | Freq: Four times a day (QID) | RESPIRATORY_TRACT | Status: DC | PRN

## 2015-09-30 NOTE — Assessment & Plan Note (Signed)
He has been feeling more short of breath in the last year in the setting of ongoing tobacco use with a known diagnosis of COPD as well as weight gain. As stated by multiple physicians recently his shortness of breath is clearly a multifactorial problem but fortunately his recent left heart catheterization did not show evidence of obstructive coronary disease.  I explained to him today that we can approach this problem of dyspnea with full pulmonary trunk and testing and then likely a CT scan which were just confirm what we already know which is that he is overweight, smokes 2 much, and has poorly treated COPD. Because he has a high deductible with his insurance plan he would prefer to forego extra testing right now.  Plan: Stop Symbicort, he's not using it right anyway Start Spiriva daily, this worked better in the past Use albuterol as needed Exercise regular, lose weight Stop smoking Follow-up 3 months, it dyspnea not improved then full pulmonary function testing

## 2015-09-30 NOTE — Assessment & Plan Note (Signed)
Counseled at length to quit smoking today. I agree with plans for lung cancer screening. He may put this off until he turns 72 given his insurance status.

## 2015-09-30 NOTE — Progress Notes (Signed)
Subjective:    Patient ID: [name], male    DOB: 1951/04/21, 65 y.o.   MRN: [id number]  Synopsis: Referred in 2014 for shortness of breath. Found to have evidence of moderate airflow obstruction and likely restriction (that this was not confirmed with lung volumes) in the setting of ongoing tobacco use. 03/05/2013 Simple spirometry > ratio 66%, FEV1 1.53 L (45%), FVC 55% pred  HPI Chief Complaint  Patient presents with  . Follow-up    pt last seen 02/2013.  pt c/o worsening sob, nonprod cough.     He has been more dyspneic for the last several months.  He has gained 40 pounds since retirement 18 months ago.  He had a knee replacement in 06/2014 and has not been exercising much since retirement.   He has been feeling more short of breath for several months now.  He recounts some asbestos exposure in the [employer] and he worked in a [employer] as a young adult.  He tells me that he wheezes more in the spring time.  No episodes of bornchitis since the last visit.  He feels like the dyspnea is primarily related to his weight gain.    He isn't using the Symbicort daily.  He will use it only periodically when he wheezes more.  He also tried Spiriva which he said worked really well but he hasn't used it in over a year.  Dyspnea with exertion only, no orthopnea or leg swelling.  He has tried to walk recently and after 200 feet he becomes dyspneic.     Past Medical History  Diagnosis Date  . Allergic rhinitis 07/1998  . COPD (chronic obstructive pulmonary disease) ([place]) 07/1998    w ongoing tobacco use, spirometry (02/2013)  . Hyperlipidemia 06/1996  . HTN (hypertension) 05/1998  . ETOH abuse   . Smoker   . Hepatitis     pos test once not now  . Lumbar spinal stenosis 03/2013    s/p laminectomy  . Dysrhythmia     PVC'S  . Shortness of breath dyspnea     WITH EXERTION  . 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 Arthritis     BACK AND RIGHT KNEE  . Positive hepatitis C  antibody test 08/2015    neg viral load - likely cleared infection      Review of Systems     Objective:   Physical Exam Filed Vitals:   09/30/15 0958  BP: 152/88  Pulse: 97  Height: 5' 6.75" (1.695 m)  Weight: 247 lb (112.038 kg)  SpO2: 98%   RA  Gen: obese but well appearing HENT: OP clear, TM's clear, neck supple PULM: CTA B, normal percussion CV: RRR, no mgr, trace edema GI: BS+, soft, nontender Derm: no cyanosis or rash Psyche: normal mood and affect   06/2013 Left Heart Cath     The left ventricular systolic function is normal.  Prox LAD to Mid LAD lesion, 30% stenosed.  Prox RCA to Mid RCA lesion, 20% stenosed.  1. Mildly calcified coronary arteries with mild two-vessel coronary artery disease involving the proximal to mid LAD and proximal to midright coronary artery. No evidence of obstructive disease. 2. Normal LV systolic function. 3. Moderately elevated left ventricular end-diastolic pressure at 24 mmHg.        Assessment & Plan:  COPD (chronic obstructive pulmonary disease) ([place]) He has been feeling more short of breath in the last year in the setting  of ongoing tobacco use with a known diagnosis of COPD as well as weight gain. As stated by multiple physicians recently his shortness of breath is clearly a multifactorial problem but fortunately his recent left heart catheterization did not show evidence of obstructive coronary disease.  I explained to him today that we can approach this problem of dyspnea with full pulmonary trunk and testing and then likely a CT scan which were just confirm what we already know which is that he is overweight, smokes 2 much, and has poorly treated COPD. Because he has a high deductible with his insurance plan he would prefer to forego extra testing right now.  Plan: Stop Symbicort, he's not using it right anyway Start Spiriva daily, this worked better in the past Use albuterol as needed Exercise regular, lose  weight Stop smoking Follow-up 3 months, it dyspnea not improved then full pulmonary function testing  Tobacco abuse Counseled at length to quit smoking today. I agree with plans for lung cancer screening. He may put this off until he turns 44 given his insurance status.  Dyspnea As above. I did review the images from his December 2016 chest x-ray which shows a chronic scar in the left base which is not changed in many years. We could work up his dyspnea with more aggressive testing (prone her function testing and possibly a CT chest) but I think it's best for him to make some effort to lose weight, quit smoking, and use inhaled medicine regularly before we undergo this.   > 50% of time spent face to face in a 26 minute visit  Current outpatient prescriptions:  .  aspirin EC 81 MG tablet, Take 81 mg by mouth daily., Disp: , Rfl:  .  atorvastatin (LIPITOR) 10 MG tablet, Take 1 tablet (10 mg total) by mouth daily., Disp: 90 tablet, Rfl: 3 .  lisinopril (PRINIVIL,ZESTRIL) 10 MG tablet, Take 1 tablet (10 mg total) by mouth daily., Disp: 90 tablet, Rfl: 3 .  OVER THE COUNTER MEDICATION, Take 1 capsule by mouth daily. [employer] B, Disp: , Rfl:  .  OVER THE COUNTER MEDICATION, Take 1 capsule by mouth daily. [employer] Sulfurzyme (for healthy immune system and to boost liver function, Disp: , Rfl:  .  sildenafil (VIAGRA) 100 MG tablet, Take 0.5-1 tablets (50-100 mg total) by mouth daily as needed for erectile dysfunction., Disp: 5 tablet, Rfl: 3 .  albuterol (PROVENTIL HFA;VENTOLIN HFA) 108 (90 Base) MCG/ACT inhaler, Inhale 2 puffs into the lungs every 6 (six) hours as needed for wheezing or shortness of breath., Disp: 1 Inhaler, Rfl: 2 .  tiotropium (SPIRIVA) 18 MCG inhalation capsule, Place 1 capsule (18 mcg total) into inhaler and inhale daily., Disp: 30 capsule, Rfl: 6

## 2015-09-30 NOTE — Assessment & Plan Note (Signed)
As above. I did review the images from his December 2016 chest x-ray which shows a chronic scar in the left base which is not changed in many years. We could work up his dyspnea with more aggressive testing (prone her function testing and possibly a CT chest) but I think it's best for him to make some effort to lose weight, quit smoking, and use inhaled medicine regularly before we undergo this.

## 2015-09-30 NOTE — Patient Instructions (Signed)
Takes Spiriva daily no matter how you feel Use albuterol on an as-needed basis for chest tightness, wheezing, or shortness of breath Exercise regularly, lose weight Stop smoking, stop smoking, stop smoking I will see you back in 3 months or sooner if needed

## 2015-10-05 ENCOUNTER — Encounter: Payer: BLUE CROSS/BLUE SHIELD | Admitting: Acute Care

## 2015-10-05 ENCOUNTER — Inpatient Hospital Stay: Admission: RE | Admit: 2015-10-05 | Payer: BLUE CROSS/BLUE SHIELD | Source: Ambulatory Visit

## 2015-10-08 ENCOUNTER — Telehealth: Payer: Self-pay | Admitting: Acute Care

## 2015-10-08 NOTE — Telephone Encounter (Signed)
Called patient to r/s appointment with Pedro Form NP for the lung cancer screening program. Pt had appt scheduled for 10/05/15 along with LDCT but cancelled these appointments. Pt did not want to r/s d/t not meeting deductible for insurance. I advised will inform referring provider.

## 2015-11-17 ENCOUNTER — Telehealth: Payer: Self-pay | Admitting: *Deleted

## 2015-11-17 NOTE — Telephone Encounter (Signed)
Previously received referral for lung cancer screening scan. Noted patient has had concerns about deductible amount. Left voicemail for patient to call back and we can further discuss screening as well as potential to assist with screening deductible amount.

## 2015-12-29 ENCOUNTER — Ambulatory Visit (INDEPENDENT_AMBULATORY_CARE_PROVIDER_SITE_OTHER): Payer: BLUE CROSS/BLUE SHIELD | Admitting: Pulmonary Disease

## 2015-12-29 ENCOUNTER — Encounter: Payer: Self-pay | Admitting: Pulmonary Disease

## 2015-12-29 VITALS — BP 142/76 | HR 77 | Ht 66.75 in | Wt 247.0 lb

## 2015-12-29 DIAGNOSIS — R0600 Dyspnea, unspecified: Secondary | ICD-10-CM

## 2015-12-29 DIAGNOSIS — Z720 Tobacco use: Secondary | ICD-10-CM

## 2015-12-29 DIAGNOSIS — J449 Chronic obstructive pulmonary disease, unspecified: Secondary | ICD-10-CM | POA: Diagnosis not present

## 2015-12-29 MED ORDER — NICOTINE 10 MG IN INHA: 1.0000 | RESPIRATORY_TRACT | Status: DC | PRN

## 2015-12-29 NOTE — Assessment & Plan Note (Signed)
He has at worst moderate airflow obstruction, it may not be quite this bad because I think there is a component of restrictive lung disease which limits our assessment with spirometry.  This is been a stable interval for him on Spiriva.  Plan: Continue Spiriva Exercise regularly Weight loss encouraged Follow-up 6 months  > 50% of this visit spent face to face, 27 minute visit

## 2015-12-29 NOTE — Patient Instructions (Signed)
Stop smoking I recommend that you download the app "My fitness pal" to help track calories Use the nicotrol inhaler to help quit smoking Keep taking Spiriva as you are doing We will see you back in 6 months or sooner if needed

## 2015-12-29 NOTE — Assessment & Plan Note (Signed)
STable interval.  Better with Spiriva.  Still present due to obesity.  Rec:  Weight loss

## 2015-12-29 NOTE — Progress Notes (Signed)
Subjective:    Patient ID: [name], male    DOB: 1951-02-24, 65 y.o.   MRN: [id number]  Synopsis: Referred in 2014 for shortness of breath. Found to have evidence of moderate airflow obstruction and likely restriction (that this was not confirmed with lung volumes) in the setting of ongoing tobacco use. 03/05/2013 Simple spirometry > ratio 66%, FEV1 1.53 L (45%), FVC 55% pred  HPI Chief Complaint  Patient presents with  . Follow-up    pt's SOB is slightly improved- states Spiriva has helped with s/s.  Wants to discuss smoking cessation med options.    [name] says that retirement life has been "too good".  He has not been exercising much but he remains active with hobbies.  He has not gained weight since the last visit.  COPD> his breathing is better with Spiriva.  He still has some dyspnea which he attributes to his weight gain since retirement.  He says that the albuterol works but he doesn't need it much.    He is still smoking.  He had migraine headaches on Chantix when he took it before when he took it for 3-4 months.  He has not had a migraine since then.  He really wants to quit smoking.  He has not used any nicotine replacement forms in a few years. He says that the gum.     Past Medical History  Diagnosis Date  . Allergic rhinitis 07/1998  . COPD (chronic obstructive pulmonary disease) ([place]) 07/1998    w ongoing tobacco use, spirometry (02/2013)  . Hyperlipidemia 06/1996  . HTN (hypertension) 05/1998  . ETOH abuse   . Smoker   . Hepatitis     pos test once not now  . Lumbar spinal stenosis 03/2013    s/p laminectomy  . Dysrhythmia     PVC'S  . Shortness of breath dyspnea     WITH EXERTION  . 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 Arthritis     BACK AND RIGHT KNEE  . Positive hepatitis C antibody test 08/2015    neg viral load - likely cleared infection      Review of Systems     Objective:   Physical Exam Filed Vitals:   12/29/15 0959    BP: 142/76  Pulse: 77  Height: 5' 6.75" (1.695 m)  Weight: 247 lb (112.038 kg)  SpO2: 98%   RA  Gen: obese but well appearing HENT: OP clear, TM's clear, neck supple PULM: CTA B, normal percussion CV: RRR, no mgr, trace edema GI: BS+, soft, nontender Derm: no cyanosis or rash Psyche: normal mood and affect   06/2013 Left Heart Cath     The left ventricular systolic function is normal.  Prox LAD to Mid LAD lesion, 30% stenosed.  Prox RCA to Mid RCA lesion, 20% stenosed.  1. Mildly calcified coronary arteries with mild two-vessel coronary artery disease involving the proximal to mid LAD and proximal to midright coronary artery. No evidence of obstructive disease. 2. Normal LV systolic function. 3. Moderately elevated left ventricular end-diastolic pressure at 24 mmHg.        Assessment & Plan:  Dyspnea STable interval.  Better with Spiriva.  Still present due to obesity.  Rec:  Weight loss  Tobacco abuse WE discussed various treatment and behavioral options for smoking cessation today.  He is motivated.  After lengthy discussion with the Nicotrol inhaler.  We discussed this for 5 minutes today. He is  reluctant to use Chantix because it caused migraines.  COPD (chronic obstructive pulmonary disease) ([place]) He has at worst moderate airflow obstruction, it may not be quite this bad because I think there is a component of restrictive lung disease which limits our assessment with spirometry.  This is been a stable interval for him on Spiriva.  Plan: Continue Spiriva Exercise regularly Weight loss encouraged Follow-up 6 months  > 50% of this visit spent face to face, 27 minute visit   > 50% of time spent face to face in a 26 minute visit  Current outpatient prescriptions:  .  albuterol (PROVENTIL HFA;VENTOLIN HFA) 108 (90 Base) MCG/ACT inhaler, Inhale 2 puffs into the lungs every 6 (six) hours as needed for wheezing or shortness of breath., Disp: 1 Inhaler, Rfl:  2 .  aspirin EC 81 MG tablet, Take 81 mg by mouth daily., Disp: , Rfl:  .  atorvastatin (LIPITOR) 10 MG tablet, Take 1 tablet (10 mg total) by mouth daily., Disp: 90 tablet, Rfl: 3 .  lisinopril (PRINIVIL,ZESTRIL) 10 MG tablet, Take 1 tablet (10 mg total) by mouth daily., Disp: 90 tablet, Rfl: 3 .  OVER THE COUNTER MEDICATION, Take 1 capsule by mouth daily. [employer] B, Disp: , Rfl:  .  OVER THE COUNTER MEDICATION, Take 1 capsule by mouth daily. [employer] S[employer]ur[employer] (for healthy immune system and to boost liver function, Disp: , Rfl:  .  sildenafil (VIAGRA) 100 MG tablet, Take 0.5-1 tablets (50-100 mg total) by mouth daily as needed for erectile dysfunction., Disp: 5 tablet, Rfl: 3 .  tiotropium (SPIRIVA) 18 MCG inhalation capsule, Place 1 capsule (18 mcg total) into inhaler and inhale daily., Disp: 30 capsule, Rfl: 6 .  nicotine (NICOTROL) 10 MG inhaler, Inhale 1 cartridge (1 continuous puffing total) into the lungs as needed for smoking cessation., Disp: 42 each, Rfl: 6

## 2015-12-29 NOTE — Assessment & Plan Note (Signed)
WE discussed various treatment and behavioral options for smoking cessation today.  He is motivated.  After lengthy discussion with the Nicotrol inhaler.  We discussed this for 5 minutes today. He is reluctant to use Chantix because it caused migraines.

## 2016-03-01 ENCOUNTER — Other Ambulatory Visit: Payer: Self-pay | Admitting: Family Medicine

## 2016-03-01 DIAGNOSIS — E119 Type 2 diabetes mellitus without complications: Secondary | ICD-10-CM

## 2016-03-02 ENCOUNTER — Other Ambulatory Visit (INDEPENDENT_AMBULATORY_CARE_PROVIDER_SITE_OTHER): Payer: BLUE CROSS/BLUE SHIELD

## 2016-03-02 DIAGNOSIS — E119 Type 2 diabetes mellitus without complications: Secondary | ICD-10-CM

## 2016-03-02 LAB — BASIC METABOLIC PANEL
BUN: 17 mg/dL (ref 6–23)
CO2: 31 mEq/L (ref 19–32)
Calcium: 9.6 mg/dL (ref 8.4–10.5)
Chloride: 100 mEq/L (ref 96–112)
Creatinine, Ser: 0.95 mg/dL (ref 0.40–1.50)
GFR: 84.65 mL/min (ref >60.00)
Glucose, Bld: 134 mg/dL — ABNORMAL HIGH (ref 70–99)
Potassium: 4.3 mEq/L (ref 3.5–5.1)
Sodium: 138 mEq/L (ref 135–145)

## 2016-03-02 LAB — MICROALBUMIN / CREATININE URINE RATIO
Creatinine,U: 172.6 mg/dL
Microalb Creat Ratio: 0.4 mg/g (ref 0.0–30.0)
Microalb, Ur: <0.7 mg/dL (ref 0.0–1.9)

## 2016-03-02 LAB — HEMOGLOBIN A1C: Hgb A1c MFr Bld: 6.3 % (ref 4.6–6.5)

## 2016-03-07 ENCOUNTER — Ambulatory Visit (INDEPENDENT_AMBULATORY_CARE_PROVIDER_SITE_OTHER): Payer: BLUE CROSS/BLUE SHIELD | Admitting: Family Medicine

## 2016-03-07 ENCOUNTER — Encounter: Payer: Self-pay | Admitting: Family Medicine

## 2016-03-07 VITALS — BP 116/68 | HR 84 | Temp 98.0°F | Ht 66.75 in | Wt 245.0 lb

## 2016-03-07 DIAGNOSIS — I10 Essential (primary) hypertension: Secondary | ICD-10-CM

## 2016-03-07 DIAGNOSIS — B353 Tinea pedis: Secondary | ICD-10-CM

## 2016-03-07 DIAGNOSIS — E119 Type 2 diabetes mellitus without complications: Secondary | ICD-10-CM | POA: Diagnosis not present

## 2016-03-07 DIAGNOSIS — Z8619 Personal history of other infectious and parasitic diseases: Secondary | ICD-10-CM | POA: Diagnosis not present

## 2016-03-07 DIAGNOSIS — Z720 Tobacco use: Secondary | ICD-10-CM

## 2016-03-07 DIAGNOSIS — B351 Tinea unguium: Secondary | ICD-10-CM | POA: Diagnosis not present

## 2016-03-07 DIAGNOSIS — Z6835 Body mass index (BMI) 35.0-35.9, adult: Secondary | ICD-10-CM

## 2016-03-07 DIAGNOSIS — F17200 Nicotine dependence, unspecified, uncomplicated: Secondary | ICD-10-CM

## 2016-03-07 MED ORDER — TERBINAFINE HCL 250 MG PO TABS: 250.0000 mg | ORAL_TABLET | Freq: Every day | ORAL | 0 refills | Status: DC

## 2016-03-07 NOTE — Assessment & Plan Note (Signed)
Discussed healthy diet and lifestyle changes to affect sustainable weight loss  

## 2016-03-07 NOTE — Assessment & Plan Note (Signed)
A1c now well controlled in prediabetes range - discussed diet and lifestyle changes to maintain glycemic control.

## 2016-03-07 NOTE — Progress Notes (Signed)
BP 116/68 (BP Location: Left Arm)   Pulse 84   Temp 98 F (36.7 C) (Oral)   Ht 5' 6.75" (1.695 m)   Wt 245 lb (111.1 kg)   SpO2 94%   BMI 38.66 kg/m    CC: f/u labs Subjective:    Patient ID: [name], male    DOB: Mar 21, 1951, 65 y.o.   MRN: [id number]  HPI: [name] is a 65 y.o. male presenting on 03/07/2016 for Follow-up (discuss lab results)   Smoking - referred for lung cancer screening CT but cancelled referral due to financial concerns. Started on spiriva by Dr [place] Bells for COPD. Continues cutting down on   Longstanding foot rash and onychomycotic thickening of toenails.  DM - regularly does not check sugars. Compliant with antihyperglycemic regimen which includes: diet controlled. Denies low sugars or hypoglycemic symptoms.  Denies paresthesias. Last diabetic eye exam DUE.  Pneumovax: 2015.  Prevnar: not due yet. Lab Results  Component Value Date   HGBA1C 6.3 03/02/2016   Diabetic Foot Exam - Simple   Simple Foot Form Diabetic Foot exam was performed with the following findings:  Yes 03/07/2016  9:57 AM  Visual Inspection See comments:  Yes Sensation Testing Intact to touch and monofilament testing bilaterally:  Yes Pulse Check Posterior Tibialis and Dorsalis pulse intact bilaterally:  Yes Comments Thickened onychomycotic nails throughout hands and feet Thickening and scaling of bilateral soles in moccasin distribution, not pruritic, some maceration present      Relevant past medical, surgical, family and social history reviewed and updated as indicated. Interim medical history since our last visit reviewed. Allergies and medications reviewed and updated. Current Outpatient Prescriptions on File Prior to Visit  Medication Sig  . albuterol (PROVENTIL HFA;VENTOLIN HFA) 108 (90 Base) MCG/ACT inhaler Inhale 2 puffs into the lungs every 6 (six) hours as needed for wheezing or shortness of breath.  [name] aspirin EC 81 MG tablet Take 81 mg by mouth daily.  [name]  atorvastatin (LIPITOR) 10 MG tablet Take 1 tablet (10 mg total) by mouth daily.  [name] lisinopril (PRINIVIL,ZESTRIL) 10 MG tablet Take 1 tablet (10 mg total) by mouth daily.  . nicotine (NICOTROL) 10 MG inhaler Inhale 1 cartridge (1 continuous puffing total) into the lungs as needed for smoking cessation.  [name] OVER THE COUNTER MEDICATION Take 1 capsule by mouth daily. [employer] B  . OVER THE COUNTER MEDICATION Take 1 capsule by mouth daily. [employer] S[employer] (for healthy immune system and to boost liver function  . sildenafil (VIAGRA) 100 MG tablet Take 0.5-1 tablets (50-100 mg total) by mouth daily as needed for erectile dysfunction.  [name] tiotropium (SPIRIVA) 18 MCG inhalation capsule Place 1 capsule (18 mcg total) into inhaler and inhale daily.   No current facility-administered medications on file prior to visit.     Review of Systems Per HPI unless specifically indicated in ROS section     Objective:    BP 116/68 (BP Location: Left Arm)   Pulse 84   Temp 98 F (36.7 C) (Oral)   Ht 5' 6.75" (1.695 m)   Wt 245 lb (111.1 kg)   SpO2 94%   BMI 38.66 kg/m   Wt Readings from Last 3 Encounters:  03/07/16 245 lb (111.1 kg)  12/29/15 247 lb (112 kg)  09/30/15 247 lb (112 kg)    Physical Exam  Constitutional: He appears well-developed and well-nourished. No distress.  HENT:  Head: Normocephalic and atraumatic.  Right Ear: External ear  normal.  Left Ear: External ear normal.  Nose: Nose normal.  Mouth/Throat: Oropharynx is clear and moist. No oropharyngeal exudate.  Eyes: Conjunctivae and EOM are normal. Pupils are equal, round, and reactive to light. No scleral icterus.  Neck: Normal range of motion. Neck supple.  Cardiovascular: Normal rate, regular rhythm, normal heart sounds and intact distal pulses.   No murmur heard. Pulmonary/Chest: Effort normal and breath sounds normal. No respiratory distress. He has no wheezes. He has no rales.  Musculoskeletal: He exhibits no  edema.  See HPI for foot exam if done  Lymphadenopathy:    He has no cervical adenopathy.  Skin: Skin is warm and dry. Rash noted.  Psychiatric: He has a normal mood and affect.  Nursing note and vitals reviewed.  Results for orders placed or performed in visit on 03/02/16  Hemoglobin A1c  Result Value Ref Range   Hgb A1c MFr Bld 6.3 4.6 - 6.5 %  Microalbumin / creatinine urine ratio  Result Value Ref Range   Microalb, Ur <0.7 0.0 - 1.9 mg/dL   Creatinine,U 172.6 mg/dL   Microalb Creat Ratio 0.4 0.0 - 30.0 mg/g  Basic metabolic panel  Result Value Ref Range   Sodium 138 135 - 145 mEq/L   Potassium 4.3 3.5 - 5.1 mEq/L   Chloride 100 96 - 112 mEq/L   CO2 31 19 - 32 mEq/L   Glucose, Bld 134 (H) 70 - 99 mg/dL   BUN 17 6 - 23 mg/dL   Creatinine, Ser 0.95 0.40 - 1.50 mg/dL   Calcium 9.6 8.4 - 10.5 mg/dL   GFR 84.65 >60.00 mL/min      Assessment & Plan:   Problem List Items Addressed This Visit    Body mass index (BMI) of 35.0 to 35.9 with comorbidity (HCC)    Discussed healthy diet and lifestyle changes to affect sustainable weight loss.      Diet-controlled diabetes mellitus ([place]) - Primary    A1c now well controlled in prediabetes range - discussed diet and lifestyle changes to maintain glycemic control.       Essential hypertension    Chronic, stable. Continue lisinopril.       History of hepatitis C    H/o this - will need to monitor LFTs on terbinafine.      ONYCHOMYCOSIS, BILATERAL    Discussed treatment - has failed OTC remedies. Will start lamisil 250mg  daily x 6 wks, update with effect.      Relevant Medications   terbinafine (LAMISIL) 250 MG tablet   Smoker    Continues cutting down. Declined lung cancer screening CT at this time.       Tinea pedis of both feet    Treat with terbinafine. Discussed monitoring liver function on this medication.       Relevant Medications   terbinafine (LAMISIL) 250 MG tablet    Other Visit Diagnoses   None.        Follow up plan: Return in about 6 months (around 09/07/2016) for annual exam, prior fasting for blood work.  Ria Bush, MD

## 2016-03-07 NOTE — Assessment & Plan Note (Signed)
Treat with terbinafine. Discussed monitoring liver function on this medication.

## 2016-03-07 NOTE — Progress Notes (Signed)
Pre visit review using our clinic review tool, if applicable. No additional management support is needed unless otherwise documented below in the visit note. 

## 2016-03-07 NOTE — Assessment & Plan Note (Signed)
Continues cutting down. Declined lung cancer screening CT at this time.

## 2016-03-07 NOTE — Assessment & Plan Note (Signed)
Chronic, stable. Continue lisinopril.  

## 2016-03-07 NOTE — Assessment & Plan Note (Signed)
Discussed treatment - has failed OTC remedies. Will start lamisil 250mg  daily x 6 wks, update with effect.

## 2016-03-07 NOTE — Assessment & Plan Note (Signed)
H/o this - will need to monitor LFTs on terbinafine.

## 2016-03-07 NOTE — Patient Instructions (Signed)
A1c has improved. Try terbinafine daily for 6 wks, then update me with effect. If we want 2nd treatment course, return for labs first.  Follow up in 6 months for physical, sooner if needed.

## 2016-04-15 ENCOUNTER — Ambulatory Visit (INDEPENDENT_AMBULATORY_CARE_PROVIDER_SITE_OTHER): Payer: BLUE CROSS/BLUE SHIELD

## 2016-04-15 DIAGNOSIS — Z23 Encounter for immunization: Secondary | ICD-10-CM

## 2016-06-03 ENCOUNTER — Other Ambulatory Visit: Payer: Self-pay | Admitting: Pulmonary Disease

## 2016-06-03 MED ORDER — TIOTROPIUM BROMIDE MONOHYDRATE 18 MCG IN CAPS: 18.0000 ug | ORAL_CAPSULE | Freq: Every day | RESPIRATORY_TRACT | 1 refills | Status: DC

## 2016-06-03 NOTE — Telephone Encounter (Signed)
Fax received for Spiriva HH 56mcg refill. This has been refilled. Nothing further needed.

## 2016-06-30 ENCOUNTER — Encounter: Payer: Self-pay | Admitting: Pulmonary Disease

## 2016-06-30 ENCOUNTER — Ambulatory Visit (INDEPENDENT_AMBULATORY_CARE_PROVIDER_SITE_OTHER): Payer: Medicare Other | Admitting: Pulmonary Disease

## 2016-06-30 ENCOUNTER — Ambulatory Visit (INDEPENDENT_AMBULATORY_CARE_PROVIDER_SITE_OTHER)
Admission: RE | Admit: 2016-06-30 | Discharge: 2016-06-30 | Disposition: A | Discharge status: Home / Self Care | Payer: Medicare Other | Source: Ambulatory Visit | Attending: Pulmonary Disease | Admitting: Pulmonary Disease

## 2016-06-30 ENCOUNTER — Telehealth: Payer: Self-pay | Admitting: Pulmonary Disease

## 2016-06-30 VITALS — BP 154/84 | HR 97 | Temp 98.5°F | Ht 66.75 in | Wt 251.0 lb

## 2016-06-30 DIAGNOSIS — R059 Cough, unspecified: Secondary | ICD-10-CM

## 2016-06-30 DIAGNOSIS — R05 Cough: Secondary | ICD-10-CM

## 2016-06-30 DIAGNOSIS — J441 Chronic obstructive pulmonary disease with (acute) exacerbation: Secondary | ICD-10-CM | POA: Diagnosis not present

## 2016-06-30 DIAGNOSIS — R0602 Shortness of breath: Secondary | ICD-10-CM | POA: Diagnosis not present

## 2016-06-30 MED ORDER — DOXYCYCLINE HYCLATE 100 MG PO TABS: 100.0000 mg | ORAL_TABLET | Freq: Two times a day (BID) | ORAL | 0 refills | Status: DC

## 2016-06-30 MED ORDER — HYDROCOD POLST-CPM POLST ER 10-8 MG/5ML PO SUER: 5.0000 mL | Freq: Every evening | ORAL | 0 refills | Status: DC | PRN

## 2016-06-30 MED ORDER — PREDNISONE 10 MG PO TABS: ORAL_TABLET | ORAL | 0 refills | Status: DC

## 2016-06-30 MED ORDER — HYDROCOD POLST-CPM POLST ER 10-8 MG/5ML PO SUER: 5.0000 mL | Freq: Every evening | ORAL | Status: DC | PRN

## 2016-06-30 NOTE — Patient Instructions (Signed)
For your COPD exacerbation: Take the prednisone taper as prescribed Take doxycycline 100 mg twice a day for 5 days Use albuterol as needed for shortness of breath Keep taking Spiriva 2 puffs twice a day no matter how you feel Keep taking Spiriva 1 puff daily no matter how you feel We will check a chest x-ray and call you with the results Please let us know if her symptoms do not improve in the next 2-3 days or if they worsen  Four-year tobacco abuse: Stay away from cigarettes  Keep your appointment with me for January 31

## 2016-06-30 NOTE — Telephone Encounter (Signed)
Pt having increased wheezing and his breathing has been worsening over the last couple weeks. Pt states that he coughs until he vomits. Pt states that he gasps for air constantly. Pt takes Symbicort and Spiriva daily and Albuterol PRN. Pt states that he has been taking these meds for several years and is not sure if they are just not working effectively now, if he is having a reaction to the medication or if he is coming down with something. Pt states that he is not sleeping at night d/t coughing.   Pt scheduled for appt today with BQ at 3:30.   Nothing further needed.

## 2016-06-30 NOTE — Progress Notes (Signed)
Subjective:    Patient ID: [name], male    DOB: 24-Jan-1951, 65 y.o.   MRN: [id number]  Synopsis: Referred in 2014 for shortness of breath. Found to have evidence of moderate airflow obstruction and likely restriction (that this was not confirmed with lung volumes) in the setting of ongoing tobacco use. 03/05/2013 Simple spirometry > ratio 66%, FEV1 1.53 L (45%), FVC 55% pred  HPI Chief Complaint  Patient presents with  . Acute Visit    pt c/o increased sob with any exertion, chest congestion, prod cough with clear mucus, vomiting mucus X2 weeks.    Laithan says he can't breath and he is coughing all the time ever since visiting relatives in [place].  He has been coughing around the clock for weeks and he has a sharp pain in his left lower abdomen when he coughs.  He is bringing up white mucus.  None of his family was sick at the time.  He says that he has been using his albuterol a lot which helps.  He is using it every four hours.  He has not tried anything over the counter.  No fever or chills.    Prior to this he had been OK, no worsening.    He hasn't smoked in the last month because of this.  Even the nicotrol inhaler is making it worse.   He still has has taken extra doses of the Symbicort.  Prior to this he had been OK.  Past Medical History:  Diagnosis Date  . Allergic rhinitis 07/1998  . Arthritis    BACK AND RIGHT KNEE  . COPD (chronic obstructive pulmonary disease) ([place]) 07/1998   w ongoing tobacco use, spirometry (02/2013)  . Dysrhythmia    PVC'S  . ETOH abuse   . Hepatitis    pos test once not now  . HTN (hypertension) 05/1998  . Hyperlipidemia 06/1996  . Lumbar spinal stenosis 03/2013   s/p laminectomy  . 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 Shortness of breath dyspnea    WITH EXERTION  . Smoker       Review of Systems  Constitutional: Negative for  chills, fatigue and fever.  HENT: Negative for sinus pain, sinus pressure and sneezing.   Respiratory: Positive for cough, shortness of breath and wheezing.   Cardiovascular: Negative for chest pain, palpitations and leg swelling.       Objective:   Physical Exam Vitals:   06/30/16 1545  BP: (!) 154/84  Pulse: 97  Temp: 98.5 F (36.9 C)  TempSrc: Oral  SpO2: 92%  Weight: 251 lb (113.9 kg)  Height: 5' 6.75" (1.695 m)   RA  Gen: obese and mildly ill appearing HENT: OP clear, TM's clear, neck supple PULM: Wheezing bilaterally, normal percussion CV: RRR, no mgr, trace edema GI: BS+, soft, nontender Derm: no cyanosis or rash Psyche: normal mood and affect   06/2013 Left Heart Cath     The left ventricular systolic function is normal.  Prox LAD to Mid LAD lesion, 30% stenosed.  Prox RCA to Mid RCA lesion, 20% stenosed.  1. Mildly calcified coronary arteries with mild two-vessel coronary artery disease involving the proximal to mid LAD and proximal to midright coronary artery. No evidence of obstructive disease. 2. Normal LV systolic function. 3. Moderately elevated left ventricular end-diastolic pressure at 24 mmHg.   December 2016  chest x-ray normal     Assessment & Plan:  Impression: COPD exacerbation Tobacco abuse  Discussion: [name] is having an exacerbation of his COPD. I believe this is the first one that I've ever had to treat. This is due to ongoing tobacco abuse. He needs treatment now.  For your COPD exacerbation: Take the prednisone taper as prescribed Take doxycycline 100 mg twice a day for 5 days Use albuterol as needed for shortness of breath Keep taking Spiriva 2 puffs twice a day no matter how you feel Keep taking Spiriva 1 puff daily no matter how you feel We will check a chest x-ray and call you with the results Please let us know if her symptoms do not improve in the next 2-3 days or if they worsen  Four-year tobacco abuse: Stay away  from cigarettes  Keep your appointment with me for January 31     Current Outpatient Prescriptions:  .  albuterol (PROVENTIL HFA;VENTOLIN HFA) 108 (90 Base) MCG/ACT inhaler, Inhale 2 puffs into the lungs every 6 (six) hours as needed for wheezing or shortness of breath., Disp: 1 Inhaler, Rfl: 2 .  aspirin EC 81 MG tablet, Take 81 mg by mouth daily., Disp: , Rfl:  .  atorvastatin (LIPITOR) 10 MG tablet, Take 1 tablet (10 mg total) by mouth daily., Disp: 90 tablet, Rfl: 3 .  lisinopril (PRINIVIL,ZESTRIL) 10 MG tablet, Take 1 tablet (10 mg total) by mouth daily., Disp: 90 tablet, Rfl: 3 .  nicotine (NICOTROL) 10 MG inhaler, Inhale 1 cartridge (1 continuous puffing total) into the lungs as needed for smoking cessation., Disp: 42 each, Rfl: 6 .  OVER THE COUNTER MEDICATION, Take 1 capsule by mouth daily. [employer] B, Disp: , Rfl:  .  OVER THE COUNTER MEDICATION, Take 1 capsule by mouth daily. [employer] Sulfurzyme (for healthy immune system and to boost liver function, Disp: , Rfl:  .  sildenafil (VIAGRA) 100 MG tablet, Take 0.5-1 tablets (50-100 mg total) by mouth daily as needed for erectile dysfunction., Disp: 5 tablet, Rfl: 3 .  terbinafine (LAMISIL) 250 MG tablet, Take 1 tablet (250 mg total) by mouth daily., Disp: 45 tablet, Rfl: 0 .  tiotropium (SPIRIVA) 18 MCG inhalation capsule, Place 1 capsule (18 mcg total) into inhaler and inhale daily., Disp: 30 capsule, Rfl: 1

## 2016-07-04 ENCOUNTER — Telehealth: Payer: Self-pay | Admitting: Pulmonary Disease

## 2016-07-04 NOTE — Telephone Encounter (Signed)
Spoke with pt. And informed him of his results per BQ. Pt. Had no further questions. Nothing further is needed.  Notes Recorded by Juanito Doom, MD on 07/01/2016 at 2:50 PM EST A, Please let the patient know this was OK Thanks, B

## 2016-08-17 ENCOUNTER — Ambulatory Visit: Payer: BLUE CROSS/BLUE SHIELD | Admitting: Pulmonary Disease

## 2016-09-05 ENCOUNTER — Other Ambulatory Visit: Payer: Self-pay | Admitting: Family Medicine

## 2016-09-05 DIAGNOSIS — E785 Hyperlipidemia, unspecified: Secondary | ICD-10-CM

## 2016-09-05 DIAGNOSIS — E119 Type 2 diabetes mellitus without complications: Secondary | ICD-10-CM

## 2016-09-05 DIAGNOSIS — E559 Vitamin D deficiency, unspecified: Secondary | ICD-10-CM

## 2016-09-05 DIAGNOSIS — Z125 Encounter for screening for malignant neoplasm of prostate: Secondary | ICD-10-CM

## 2016-09-07 ENCOUNTER — Other Ambulatory Visit (INDEPENDENT_AMBULATORY_CARE_PROVIDER_SITE_OTHER): Payer: Medicare Other

## 2016-09-07 DIAGNOSIS — E559 Vitamin D deficiency, unspecified: Secondary | ICD-10-CM | POA: Diagnosis not present

## 2016-09-07 DIAGNOSIS — E785 Hyperlipidemia, unspecified: Secondary | ICD-10-CM

## 2016-09-07 DIAGNOSIS — E119 Type 2 diabetes mellitus without complications: Secondary | ICD-10-CM | POA: Diagnosis not present

## 2016-09-07 DIAGNOSIS — Z125 Encounter for screening for malignant neoplasm of prostate: Secondary | ICD-10-CM

## 2016-09-07 LAB — LDL CHOLESTEROL, DIRECT: Direct LDL: 82 mg/dL

## 2016-09-07 LAB — BASIC METABOLIC PANEL
BUN: 9 mg/dL (ref 6–23)
CO2: 32 mEq/L (ref 19–32)
Calcium: 9.7 mg/dL (ref 8.4–10.5)
Chloride: 97 mEq/L (ref 96–112)
Creatinine, Ser: 0.84 mg/dL (ref 0.40–1.50)
GFR: 97.41 mL/min (ref >60.00)
Glucose, Bld: 124 mg/dL — ABNORMAL HIGH (ref 70–99)
Potassium: 5.1 mEq/L (ref 3.5–5.1)
Sodium: 134 mEq/L — ABNORMAL LOW (ref 135–145)

## 2016-09-07 LAB — LIPID PANEL
Cholesterol: 176 mg/dL (ref 0–200)
HDL: 56.4 mg/dL (ref >39.00)
NonHDL: 119.26
Total CHOL/HDL Ratio: 3
Triglycerides: 202 mg/dL — ABNORMAL HIGH (ref 0.0–149.0)
VLDL: 40.4 mg/dL — ABNORMAL HIGH (ref 0.0–40.0)

## 2016-09-07 LAB — VITAMIN D 25 HYDROXY (VIT D DEFICIENCY, FRACTURES): VITD: 24.33 ng/mL — ABNORMAL LOW (ref 30.00–100.00)

## 2016-09-07 LAB — PSA, MEDICARE: PSA: 1.03 ng/ml (ref 0.10–4.00)

## 2016-09-07 LAB — HEMOGLOBIN A1C: Hgb A1c MFr Bld: 7.2 % — ABNORMAL HIGH (ref 4.6–6.5)

## 2016-09-09 ENCOUNTER — Ambulatory Visit (INDEPENDENT_AMBULATORY_CARE_PROVIDER_SITE_OTHER): Payer: Medicare Other | Admitting: Family Medicine

## 2016-09-09 ENCOUNTER — Encounter: Payer: Self-pay | Admitting: Family Medicine

## 2016-09-09 VITALS — BP 146/84 | HR 88 | Temp 98.6°F | Ht 66.75 in | Wt 249.5 lb

## 2016-09-09 DIAGNOSIS — E559 Vitamin D deficiency, unspecified: Secondary | ICD-10-CM | POA: Diagnosis not present

## 2016-09-09 DIAGNOSIS — Z0000 Encounter for general adult medical examination without abnormal findings: Secondary | ICD-10-CM

## 2016-09-09 DIAGNOSIS — Z1211 Encounter for screening for malignant neoplasm of colon: Secondary | ICD-10-CM

## 2016-09-09 DIAGNOSIS — E1165 Type 2 diabetes mellitus with hyperglycemia: Secondary | ICD-10-CM | POA: Diagnosis not present

## 2016-09-09 DIAGNOSIS — F1090 Alcohol use, unspecified, uncomplicated: Secondary | ICD-10-CM

## 2016-09-09 DIAGNOSIS — Z6835 Body mass index (BMI) 35.0-35.9, adult: Secondary | ICD-10-CM

## 2016-09-09 DIAGNOSIS — Z136 Encounter for screening for cardiovascular disorders: Secondary | ICD-10-CM

## 2016-09-09 DIAGNOSIS — I10 Essential (primary) hypertension: Secondary | ICD-10-CM | POA: Diagnosis not present

## 2016-09-09 DIAGNOSIS — E785 Hyperlipidemia, unspecified: Secondary | ICD-10-CM

## 2016-09-09 DIAGNOSIS — Z720 Tobacco use: Secondary | ICD-10-CM

## 2016-09-09 DIAGNOSIS — J449 Chronic obstructive pulmonary disease, unspecified: Secondary | ICD-10-CM

## 2016-09-09 DIAGNOSIS — Z23 Encounter for immunization: Secondary | ICD-10-CM

## 2016-09-09 DIAGNOSIS — Z7289 Other problems related to lifestyle: Secondary | ICD-10-CM

## 2016-09-09 DIAGNOSIS — N400 Enlarged prostate without lower urinary tract symptoms: Secondary | ICD-10-CM

## 2016-09-09 DIAGNOSIS — IMO0001 Reserved for inherently not codable concepts without codable children: Secondary | ICD-10-CM

## 2016-09-09 DIAGNOSIS — F1010 Alcohol abuse, uncomplicated: Secondary | ICD-10-CM | POA: Diagnosis not present

## 2016-09-09 DIAGNOSIS — Z7189 Other specified counseling: Secondary | ICD-10-CM

## 2016-09-09 MED ORDER — TIOTROPIUM BROMIDE MONOHYDRATE 18 MCG IN CAPS: 18.0000 ug | ORAL_CAPSULE | Freq: Every day | RESPIRATORY_TRACT | 11 refills | Status: DC

## 2016-09-09 MED ORDER — ALBUTEROL SULFATE HFA 108 (90 BASE) MCG/ACT IN AERS: 2.0000 | INHALATION_SPRAY | Freq: Four times a day (QID) | RESPIRATORY_TRACT | 3 refills | Status: DC | PRN

## 2016-09-09 NOTE — Assessment & Plan Note (Signed)
Discussed healthy diet and lifestyle changes to affect sustainable weight loss  

## 2016-09-09 NOTE — Assessment & Plan Note (Signed)
A1c deteriorated. Reviewed with patient. Motivated for healthy diet changes to control levels without medication. RTC 6 mo f/u visit.

## 2016-09-09 NOTE — Assessment & Plan Note (Signed)
Preventative protocols reviewed and updated unless pt declined. Discussed healthy diet and lifestyle.  

## 2016-09-09 NOTE — Assessment & Plan Note (Signed)
Advanced directives: has at home. HCPOA is daughter, Crystal. Will bring us copy.  

## 2016-09-09 NOTE — Progress Notes (Signed)
Pre visit review using our clinic review tool, if applicable. No additional management support is needed unless otherwise documented below in the visit note. 

## 2016-09-09 NOTE — Patient Instructions (Addendum)
prevnar today We will refer you for screening ultrasound of aorta We will refer you for screening CT scan of lungs. EKG looking ok today. Pass by lab to pick up stool kit. Bring [place] copy of your living will to update your chart.  Start vit D 1000 units daily. Sugar was a bit high - recheck in 6 months. Work on decreased sugars in diet (?V8 juice, beer).  Return in 6 months for follow up  Health Maintenance, Male A healthy lifestyle and preventative care can promote health and wellness.  Maintain regular health, dental, and eye exams.  Eat a healthy diet. Foods like vegetables, fruits, whole grains, low-fat dairy products, and lean protein foods contain the nutrients you need and are low in calories. Decrease your intake of foods high in solid fats, added sugars, and salt. Get information about a proper diet from your health care provider, if necessary.  Regular physical exercise is one of the most important things you can do for your health. Most adults should get at least 150 minutes of moderate-intensity exercise (any activity that increases your heart rate and causes you to sweat) each week. In addition, most adults need muscle-strengthening exercises on 2 or more days a week.   Maintain a healthy weight. The body mass index (BMI) is a screening tool to identify possible weight problems. It provides an estimate of body fat based on height and weight. Your health care provider can find your BMI and can help you achieve or maintain a healthy weight. For males 20 years and older:  A BMI below 18.5 is considered underweight.  A BMI of 18.5 to 24.9 is normal.  A BMI of 25 to 29.9 is considered overweight.  A BMI of 30 and above is considered obese.  Maintain normal blood lipids and cholesterol by exercising and minimizing your intake of saturated fat. Eat a balanced diet with plenty of fruits and vegetables. Blood tests for lipids and cholesterol should begin at age 68 and be repeated  every 5 years. If your lipid or cholesterol levels are high, you are over age 25, or you are at high risk for heart disease, you may need your cholesterol levels checked more frequently.Ongoing high lipid and cholesterol levels should be treated with medicines if diet and exercise are not working.  If you smoke, find out from your health care provider how to quit. If you do not use tobacco, do not start.  Lung cancer screening is recommended for adults aged 27-80 years who are at high risk for developing lung cancer because of a history of smoking. A yearly low-dose CT scan of the lungs is recommended for people who have at least a 30-pack-year history of smoking and are current smokers or have quit within the past 15 years. A pack year of smoking is smoking an average of 1 pack of cigarettes a day for 1 year (for example, a 30-pack-year history of smoking could mean smoking 1 pack a day for 30 years or 2 packs a day for 15 years). Yearly screening should continue until the smoker has stopped smoking for at least 15 years. Yearly screening should be stopped for people who develop a health problem that would prevent them from having lung cancer treatment.  If you choose to drink alcohol, do not have more than 2 drinks per day. One drink is considered to be 12 oz (360 mL) of beer, 5 oz (150 mL) of wine, or 1.5 oz (45 mL) of liquor.  Avoid the use of street drugs. Do not share needles with anyone. Ask for help if you need support or instructions about stopping the use of drugs.  High blood pressure causes heart disease and increases the risk of stroke. High blood pressure is more likely to develop in:  People who have blood pressure in the end of the normal range (100-139/85-89 mm Hg).  People who are overweight or obese.  People who are African American.  If you are 43-11 years of age, have your blood pressure checked every 3-5 years. If you are 58 years of age or older, have your blood pressure  checked every year. You should have your blood pressure measured twice-once when you are at a hospital or clinic, and once when you are not at a hospital or clinic. Record the average of the two measurements. To check your blood pressure when you are not at a hospital or clinic, you can use:  An automated blood pressure machine at a pharmacy.  A home blood pressure monitor.  If you are 95-37 years old, ask your health care provider if you should take aspirin to prevent heart disease.  Diabetes screening involves taking a blood sample to check your fasting blood sugar level. This should be done once every 3 years after age 92 if you are at a normal weight and without risk factors for diabetes. Testing should be considered at a younger age or be carried out more frequently if you are overweight and have at least 1 risk factor for diabetes.  Colorectal cancer can be detected and often prevented. Most routine colorectal cancer screening begins at the age of 68 and continues through age 59. However, your health care provider may recommend screening at an earlier age if you have risk factors for colon cancer. On a yearly basis, your health care provider may provide home test kits to check for hidden blood in the stool. A small camera at the end of a tube may be used to directly examine the colon (sigmoidoscopy or colonoscopy) to detect the earliest forms of colorectal cancer. Talk to your health care provider about this at age 62 when routine screening begins. A direct exam of the colon should be repeated every 5-10 years through age 92, unless early forms of precancerous polyps or small growths are found.  People who are at an increased risk for hepatitis B should be screened for this virus. You are considered at high risk for hepatitis B if:  You were born in a country where hepatitis B occurs often. Talk with your health care provider about which countries are considered high risk.  Your parents were  born in a high-risk country and you have not received a shot to protect against hepatitis B (hepatitis B vaccine).  You have HIV or AIDS.  You use needles to inject street drugs.  You live with, or have sex with, someone who has hepatitis B.  You are a man who has sex with other men (MSM).  You get hemodialysis treatment.  You take certain medicines for conditions like cancer, organ transplantation, and autoimmune conditions.  Hepatitis C blood testing is recommended for all people born from [birth year] through 1965 and any individual with known risk factors for hepatitis C.  Healthy men should no longer receive prostate-specific antigen (PSA) blood tests as part of routine cancer screening. Talk to your health care provider about prostate cancer screening.  Testicular cancer screening is not recommended for adolescents or adult males who  have no symptoms. Screening includes self-exam, a health care provider exam, and other screening tests. Consult with your health care provider about any symptoms you have or any concerns you have about testicular cancer.  Practice safe sex. Use condoms and avoid high-risk sexual practices to reduce the spread of sexually transmitted infections (STIs).  You should be screened for STIs, including gonorrhea and chlamydia if:  You are sexually active and are younger than 24 years.  You are older than 24 years, and your health care provider tells you that you are at risk for this type of infection.  Your sexual activity has changed since you were last screened, and you are at an increased risk for chlamydia or gonorrhea. Ask your health care provider if you are at risk.  If you are at risk of being infected with HIV, it is recommended that you take a prescription medicine daily to prevent HIV infection. This is called pre-exposure prophylaxis (PrEP). You are considered at risk if:  You are a man who has sex with other men (MSM).  You are a heterosexual man who  is sexually active with multiple partners.  You take drugs by injection.  You are sexually active with a partner who has HIV.  Talk with your health care provider about whether you are at high risk of being infected with HIV. If you choose to begin PrEP, you should first be tested for HIV. You should then be tested every 3 months for as long as you are taking PrEP.  Use sunscreen. Apply sunscreen liberally and repeatedly throughout the day. You should seek shade when your shadow is shorter than you. Protect yourself by wearing long sleeves, pants, a wide-brimmed hat, and sunglasses year round whenever you are outdoors.  Tell your health care provider of new moles or changes in moles, especially if there is a change in shape or color. Also, tell your health care provider if a mole is larger than the size of a pencil eraser.  A one-time screening for abdominal aortic aneurysm (AAA) and surgical repair of large AAAs by ultrasound is recommended for men aged 70-75 years who are current or former smokers.  Stay current with your vaccines (immunizations). This information is not intended to replace advice given to you by your health care provider. Make sure you discuss any questions you have with your health care provider. Document Released: 12/31/2007 Document Revised: 07/25/2014 Document Reviewed: 04/07/2015 Elsevier Interactive Patient Education  2017 [employer].

## 2016-09-09 NOTE — Assessment & Plan Note (Signed)
rec start vit D 1000 IU daily OTC

## 2016-09-09 NOTE — Assessment & Plan Note (Addendum)
Continue to encourage cessation. Action phase. Using nicotrol inhaler. Down to 1/2 ppd.  Will refer for lung cancer screening CT

## 2016-09-09 NOTE — Addendum Note (Signed)
Addended by: Royann Shivers A on: 09/09/2016 10:39 AM   Modules accepted: Orders, SmartSet

## 2016-09-09 NOTE — Assessment & Plan Note (Signed)
Chronic, deteriorated. Discussed healthy diet changes to lower triglycerides. Continue atorvastatin.

## 2016-09-09 NOTE — Assessment & Plan Note (Signed)
Minimal on exam, PSA stable.

## 2016-09-09 NOTE — Assessment & Plan Note (Addendum)
Encouraged decreased alcohol use in setting of elevated A1c and h/o hep C.

## 2016-09-09 NOTE — Assessment & Plan Note (Signed)
Appreciate pulm care. Continue spiriva. Encouraged weight loss. Refilled respiratory medications.

## 2016-09-09 NOTE — Progress Notes (Addendum)
BP (!) 146/84   Pulse 88   Temp 98.6 F (37 C) (Oral)   Ht 5' 6.75" (1.695 m)   Wt 249 lb 8 oz (113.2 kg)   BMI 39.37 kg/m    CC: welcome to medicare visit Subjective:    Patient ID: [name], male    DOB: 1951/03/24, 66 y.o.   MRN: [id number]  HPI: [name] is a 66 y.o. male presenting on 09/09/2016 for Annual Exam   COPD - spiriva started last year by Dr [place] Bells. Stable period on spiriva.  Lower back pain - concerned bulging disc has returned. Will return to see Dr Aris Lot hearing and vision screens Denies depression No falls in the past year  Preventative: Colonoscopy 09/23/2010 - polyps, diverticula, hemorrhoids. Rpt 5 yrs [place]). Requests iFOB this year.  Prostate - normal DRE/PSA in past. Nocturia x3-4. Strong stream. Q62yr check today.  Lung cancer screening - discussed - interested. Will refer to lung cancer screening nurse.  AAA screen - will refer.  Flu shot - 03/2015 Pneumovax - 2015, prevnar today. Tdap 2014 zostavax - 09/2013 Advanced directives: has at home. HCPOA is daughter, [name]. Will bring [place] copy Seat belt use discussed. Sunscreen use discussed. No changing moles on skin.  Smoking 1/2 ppd. Using nicotrol inhaler Alcohol - 6 beers/day  Lives with wife. Has 1 son, 2 daughters.  Occupation: Psychologist, clinical.  Activity: no regular exercise. Treadmill for christmas Diet: good water, fruits/vegetables daily  Relevant past medical, surgical, family and social history reviewed and updated as indicated. Interim medical history since our last visit reviewed. Allergies and medications reviewed and updated.  Outpatient Medications Prior to Visit  Medication Sig Dispense Refill  . aspirin EC 81 MG tablet Take 81 mg by mouth daily.    [name] atorvastatin (LIPITOR) 10 MG tablet TAKE 1 TABLET BY MOUTH ONCE A DAY 90 tablet 1  . lisinopril (PRINIVIL,ZESTRIL) 10 MG tablet TAKE 1 TABLET BY MOUTH ONCE A DAY 90 tablet 1  . OVER THE COUNTER  MEDICATION Take 1 capsule by mouth daily. [employer] B    . OVER THE COUNTER MEDICATION Take 1 capsule by mouth daily. [employer] Sulfurzyme (for healthy immune system and to boost liver function    . sildenafil (VIAGRA) 100 MG tablet Take 0.5-1 tablets (50-100 mg total) by mouth daily as needed for erectile dysfunction. 5 tablet 3  . albuterol (PROVENTIL HFA;VENTOLIN HFA) 108 (90 Base) MCG/ACT inhaler Inhale 2 puffs into the lungs every 6 (six) hours as needed for wheezing or shortness of breath. 1 Inhaler 2  . tiotropium (SPIRIVA) 18 MCG inhalation capsule Place 1 capsule (18 mcg total) into inhaler and inhale daily. 30 capsule 1  . nicotine (NICOTROL) 10 MG inhaler Inhale 1 cartridge (1 continuous puffing total) into the lungs as needed for smoking cessation. (Patient not taking: Reported on 09/09/2016) 42 each 6  . chlorpheniramine-HYDROcodone (TUSSIONEX PENNKINETIC ER) 10-8 MG/5ML SUER Take 5 mLs by mouth at bedtime as needed for cough. (Patient not taking: Reported on 09/09/2016) 140 mL 0  . doxycycline (VIBRA-TABS) 100 MG tablet Take 1 tablet (100 mg total) by mouth 2 (two) times daily. 10 tablet 0  . predniSONE (DELTASONE) 10 MG tablet Take 40mg  po daily for 3 days, then take 30mg  po daily for 3 days, then take 20mg  po daily for two days, then take 10mg  po daily for 2 days 27 tablet 0  . terbinafine (LAMISIL) 250 MG tablet Take 1  tablet (250 mg total) by mouth daily. 45 tablet 0  . chlorpheniramine-HYDROcodone (TUSSIONEX) 10-8 MG/5ML suspension 5 mL      No facility-administered medications prior to visit.      Per HPI unless specifically indicated in ROS section below Review of Systems  Constitutional: Negative for activity change, appetite change, chills, fatigue, fever and unexpected weight change.  HENT: Negative for hearing loss.   Eyes: Negative for visual disturbance.  Respiratory: Positive for wheezing. Negative for cough, chest tightness and shortness of breath.     Cardiovascular: Negative for chest pain, palpitations and leg swelling.  Gastrointestinal: Negative for abdominal distention, abdominal pain, blood in stool, constipation, diarrhea, nausea and vomiting.  Genitourinary: Negative for difficulty urinating and hematuria.  Musculoskeletal: Negative for arthralgias, myalgias and neck pain.  Skin: Negative for rash.  Neurological: Negative for dizziness, seizures, syncope and headaches.  Hematological: Negative for adenopathy. Bruises/bleeds easily.  Psychiatric/Behavioral: Negative for dysphoric mood. The patient is not nervous/anxious.       Objective:    BP (!) 146/84   Pulse 88   Temp 98.6 F (37 C) (Oral)   Ht 5' 6.75" (1.695 m)   Wt 249 lb 8 oz (113.2 kg)   BMI 39.37 kg/m   Wt Readings from Last 3 Encounters:  09/09/16 249 lb 8 oz (113.2 kg)  06/30/16 251 lb (113.9 kg)  03/07/16 245 lb (111.1 kg)    Physical Exam  Constitutional: He is oriented to person, place, and time. He appears well-developed and well-nourished. No distress.  HENT:  Head: Normocephalic and atraumatic.  Right Ear: Hearing, tympanic membrane, external ear and ear canal normal.  Left Ear: Hearing, tympanic membrane, external ear and ear canal normal.  Nose: Nose normal.  Mouth/Throat: Uvula is midline, oropharynx is clear and moist and mucous membranes are normal. No oropharyngeal exudate, posterior oropharyngeal edema or posterior oropharyngeal erythema.  Eyes: Conjunctivae and EOM are normal. Pupils are equal, round, and reactive to light. No scleral icterus.  Neck: Normal range of motion. Neck supple. Carotid bruit is not present. No thyromegaly present.  Cardiovascular: Normal rate, regular rhythm, normal heart sounds and intact distal pulses.   No murmur heard. Pulses:      Radial pulses are 2+ on the right side, and 2+ on the left side.  Pulmonary/Chest: Effort normal and breath sounds normal. No respiratory distress. He has no wheezes. He has no  rales.  Abdominal: Soft. Bowel sounds are normal. He exhibits no distension and no mass. There is no tenderness. There is no rebound and no guarding.  Genitourinary: Rectum normal and prostate normal. Rectal exam shows no external hemorrhoid, no internal hemorrhoid, no fissure, no mass, no tenderness and anal tone normal. Prostate is not enlarged (10gm) and not tender.  Musculoskeletal: Normal range of motion. He exhibits no edema.  Lymphadenopathy:    He has no cervical adenopathy.  Neurological: He is alert and oriented to person, place, and time.  CN grossly intact, station and gait intact Recall 3/3 Calculation 5/5 serial 3s  Skin: Skin is warm and dry. No rash noted.  Psychiatric: He has a normal mood and affect. His behavior is normal. Judgment and thought content normal.  Nursing note and vitals reviewed.  Results for orders placed or performed in visit on 09/07/16  Lipid panel  Result Value Ref Range   Cholesterol 176 0 - 200 mg/dL   Triglycerides 202.0 (H) 0.0 - 149.0 mg/dL   HDL 56.40 >39.00 mg/dL   VLDL 40.4 (  H) 0.0 - 40.0 mg/dL   Total CHOL/HDL Ratio 3    NonHDL 119.26   PSA, Medicare  Result Value Ref Range   PSA 1.03 0.10 - 4.00 ng/ml  Basic metabolic panel  Result Value Ref Range   Sodium 134 (L) 135 - 145 mEq/L   Potassium 5.1 3.5 - 5.1 mEq/L   Chloride 97 96 - 112 mEq/L   CO2 32 19 - 32 mEq/L   Glucose, Bld 124 (H) 70 - 99 mg/dL   BUN 9 6 - 23 mg/dL   Creatinine, Ser 0.84 0.40 - 1.50 mg/dL   Calcium 9.7 8.4 - 10.5 mg/dL   GFR 97.41 >60.00 mL/min  VITAMIN D 25 Hydroxy (Vit-D Deficiency, Fractures)  Result Value Ref Range   VITD 24.33 (L) 30.00 - 100.00 ng/mL  Hemoglobin A1c  Result Value Ref Range   Hgb A1c MFr Bld 7.2 (H) 4.6 - 6.5 %  LDL cholesterol, direct  Result Value Ref Range   Direct LDL 82.0 mg/dL   EKG - NSR rate 75, normal axis, intervals, no acute ST/T changes, good R wave progression.    Assessment & Plan:   Problem List Items Addressed  This Visit    Advanced care planning/counseling discussion    Advanced directives: has at home. HCPOA is daughter, [name]. Will bring [place] copy       Body mass index (BMI) of 35.0 to 35.9 with comorbidity    Discussed healthy diet and lifestyle changes to affect sustainable weight loss.       BPH (benign prostatic hyperplasia)    Minimal on exam, PSA stable.       COPD (chronic obstructive pulmonary disease) (HCC)    Appreciate pulm care. Continue spiriva. Encouraged weight loss. Refilled respiratory medications.      Relevant Medications   tiotropium (SPIRIVA) 18 MCG inhalation capsule   albuterol (PROVENTIL HFA;VENTOLIN HFA) 108 (90 Base) MCG/ACT inhaler   Diabetes mellitus type 2, uncontrolled (HCC)    A1c deteriorated. Reviewed with patient. Motivated for healthy diet changes to control levels without medication. RTC 6 mo f/u visit.       Essential hypertension    Chronic, stable. Continue lisinopril.       Habitual alcohol use    Encouraged decreased alcohol use in setting of elevated A1c and h/o hep C.       Health maintenance examination    Preventative protocols reviewed and updated unless pt declined. Discussed healthy diet and lifestyle.       HLD (hyperlipidemia)    Chronic, deteriorated. Discussed healthy diet changes to lower triglycerides. Continue atorvastatin.       Tobacco abuse    Continue to encourage cessation. Action phase. Using nicotrol inhaler. Down to 1/2 ppd.  Will refer for lung cancer screening CT      Relevant Orders   Ambulatory Referral for Lung Cancer Scre   Vitamin D deficiency    rec start vit D 1000 IU daily OTC       Other Visit Diagnoses    Welcome to Medicare preventive visit    -  Primary   Relevant Orders   EKG 12-Lead (Completed)   Special screening for malignant neoplasms, colon       Relevant Orders   Fecal occult blood, imunochemical   Screening for AAA (abdominal aortic aneurysm)       Relevant Orders   VAS [place]  AAA DUPLEX       Follow up plan: Return in about 6  months (around 03/09/2017) for follow up visit.  Ria Bush, MD

## 2016-09-09 NOTE — Assessment & Plan Note (Signed)
Chronic, stable. Continue lisinopril.  

## 2016-09-13 ENCOUNTER — Telehealth: Payer: Self-pay | Admitting: *Deleted

## 2016-09-13 NOTE — Telephone Encounter (Signed)
Received referral for low dose lung cancer screening CT scan. Voicemail left at phone number listed in EMR for patient to call me back to facilitate scheduling scan.  

## 2016-09-14 ENCOUNTER — Telehealth: Payer: Self-pay | Admitting: *Deleted

## 2016-09-14 DIAGNOSIS — Z87891 Personal history of nicotine dependence: Secondary | ICD-10-CM

## 2016-09-14 NOTE — Telephone Encounter (Signed)
Received referral for initial lung cancer screening scan. Contacted patient and obtained smoking history,(current, 30 pack year) as well as answering questions related to screening process. Patient denies signs of lung cancer such as weight loss or hemoptysis. Patient denies comorbidity that would prevent curative treatment if lung cancer were found. Patient is tentatively scheduled for shared decision making visit and CT scan on 09/27/16, pending insurance approval from business office.

## 2016-09-15 DIAGNOSIS — I2510 Atherosclerotic heart disease of native coronary artery without angina pectoris: Secondary | ICD-10-CM | POA: Insufficient documentation

## 2016-09-15 DIAGNOSIS — I700 Atherosclerosis of aorta: Secondary | ICD-10-CM

## 2016-09-15 HISTORY — DX: Atherosclerotic heart disease of native coronary artery without angina pectoris: I25.10

## 2016-09-15 HISTORY — DX: Atherosclerosis of aorta: I70.0

## 2016-09-16 ENCOUNTER — Other Ambulatory Visit (INDEPENDENT_AMBULATORY_CARE_PROVIDER_SITE_OTHER): Payer: Medicare Other

## 2016-09-16 DIAGNOSIS — Z1211 Encounter for screening for malignant neoplasm of colon: Secondary | ICD-10-CM

## 2016-09-16 LAB — FECAL OCCULT BLOOD, GUAIAC: Fecal Occult Blood: NEGATIVE

## 2016-09-16 LAB — FECAL OCCULT BLOOD, IMMUNOCHEMICAL: Fecal Occult Bld: NEGATIVE

## 2016-09-19 ENCOUNTER — Encounter: Payer: Self-pay | Admitting: *Deleted

## 2016-09-27 ENCOUNTER — Inpatient Hospital Stay: Payer: Medicare Other | Attending: Oncology | Admitting: Oncology

## 2016-09-27 ENCOUNTER — Encounter: Payer: Self-pay | Admitting: Oncology

## 2016-09-27 ENCOUNTER — Ambulatory Visit
Admission: RE | Admit: 2016-09-27 | Discharge: 2016-09-27 | Disposition: A | Discharge status: Home / Self Care | Payer: Medicare Other | Source: Ambulatory Visit | Attending: Oncology | Admitting: Oncology

## 2016-09-27 DIAGNOSIS — Z87891 Personal history of nicotine dependence: Secondary | ICD-10-CM

## 2016-09-27 DIAGNOSIS — F17210 Nicotine dependence, cigarettes, uncomplicated: Secondary | ICD-10-CM | POA: Diagnosis not present

## 2016-09-27 DIAGNOSIS — Z122 Encounter for screening for malignant neoplasm of respiratory organs: Secondary | ICD-10-CM | POA: Diagnosis not present

## 2016-09-27 NOTE — Progress Notes (Signed)
In accordance with CMS guidelines, patient has met eligibility criteria including age, absence of signs or symptoms of lung cancer.  Social History  Substance Use Topics  . Smoking status: Current Every Day Smoker    Packs/day: 0.75    Years: 40.00    Types: Cigarettes  . Smokeless tobacco: Never Used     Comment: anywhere from .25-1ppd   . Alcohol use 33.6 oz/week    56 Cans of beer per week     Comment: 6 beers/day     A shared decision-making session was conducted prior to the performance of CT scan. This includes one or more decision aids, includes benefits and harms of screening, follow-up diagnostic testing, over-diagnosis, false positive rate, and total radiation exposure.  Counseling on the importance of adherence to annual lung cancer LDCT screening, impact of co-morbidities, and ability or willingness to undergo diagnosis and treatment is imperative for compliance of the program.  Counseling on the importance of continued smoking cessation for former smokers; the importance of smoking cessation for current smokers, and information about tobacco cessation interventions have been given to patient including [place] and 1800 quit [place] programs.  Written order for lung cancer screening with LDCT has been given to the patient and any and all questions have been answered to the best of my abilities.   Yearly follow up will be coordinated by Burgess Estelle, Thoracic Navigator.  Faythe Casa, NP

## 2016-09-30 ENCOUNTER — Encounter: Payer: Self-pay | Admitting: Family Medicine

## 2016-09-30 ENCOUNTER — Encounter: Payer: Self-pay | Admitting: *Deleted

## 2016-10-10 ENCOUNTER — Ambulatory Visit: Payer: Medicare Other

## 2016-10-10 DIAGNOSIS — Z136 Encounter for screening for cardiovascular disorders: Secondary | ICD-10-CM

## 2016-10-13 ENCOUNTER — Encounter: Payer: Self-pay | Admitting: *Deleted

## 2016-10-17 ENCOUNTER — Other Ambulatory Visit: Payer: Self-pay | Admitting: Family Medicine

## 2016-10-17 NOTE — Telephone Encounter (Signed)
Not on med list, but spiriva is, please advise

## 2017-02-10 ENCOUNTER — Other Ambulatory Visit: Payer: Self-pay | Admitting: Family Medicine

## 2017-03-05 ENCOUNTER — Other Ambulatory Visit: Payer: Self-pay | Admitting: Family Medicine

## 2017-03-08 ENCOUNTER — Other Ambulatory Visit: Payer: Self-pay | Admitting: Family Medicine

## 2017-03-08 ENCOUNTER — Other Ambulatory Visit (INDEPENDENT_AMBULATORY_CARE_PROVIDER_SITE_OTHER): Payer: Medicare Other

## 2017-03-08 DIAGNOSIS — E785 Hyperlipidemia, unspecified: Secondary | ICD-10-CM

## 2017-03-08 DIAGNOSIS — E559 Vitamin D deficiency, unspecified: Secondary | ICD-10-CM

## 2017-03-08 DIAGNOSIS — E1165 Type 2 diabetes mellitus with hyperglycemia: Secondary | ICD-10-CM

## 2017-03-08 DIAGNOSIS — IMO0001 Reserved for inherently not codable concepts without codable children: Secondary | ICD-10-CM

## 2017-03-08 LAB — LIPID PANEL
Cholesterol: 169 mg/dL (ref 0–200)
HDL: 63.3 mg/dL (ref >39.00)
LDL Cholesterol: 93 mg/dL (ref 0–99)
NonHDL: 106.01
Total CHOL/HDL Ratio: 3
Triglycerides: 67 mg/dL (ref 0.0–149.0)
VLDL: 13.4 mg/dL (ref 0.0–40.0)

## 2017-03-08 LAB — COMPREHENSIVE METABOLIC PANEL
ALT: 18 U/L (ref 0–53)
AST: 17 U/L (ref 0–37)
Albumin: 4.2 g/dL (ref 3.5–5.2)
Alkaline Phosphatase: 88 U/L (ref 39–117)
BUN: 14 mg/dL (ref 6–23)
CO2: 33 mEq/L — ABNORMAL HIGH (ref 19–32)
Calcium: 10 mg/dL (ref 8.4–10.5)
Chloride: 96 mEq/L (ref 96–112)
Creatinine, Ser: 0.89 mg/dL (ref 0.40–1.50)
GFR: 90.98 mL/min (ref >60.00)
Glucose, Bld: 125 mg/dL — ABNORMAL HIGH (ref 70–99)
Potassium: 5 mEq/L (ref 3.5–5.1)
Sodium: 135 mEq/L (ref 135–145)
Total Bilirubin: 0.6 mg/dL (ref 0.2–1.2)
Total Protein: 7.1 g/dL (ref 6.0–8.3)

## 2017-03-08 LAB — HEMOGLOBIN A1C: Hgb A1c MFr Bld: 6.3 % (ref 4.6–6.5)

## 2017-03-08 LAB — VITAMIN D 25 HYDROXY (VIT D DEFICIENCY, FRACTURES): VITD: 30.79 ng/mL (ref 30.00–100.00)

## 2017-03-10 ENCOUNTER — Encounter: Payer: Self-pay | Admitting: Family Medicine

## 2017-03-10 ENCOUNTER — Ambulatory Visit (INDEPENDENT_AMBULATORY_CARE_PROVIDER_SITE_OTHER): Payer: Medicare Other | Admitting: Family Medicine

## 2017-03-10 VITALS — BP 156/84 | HR 77 | Temp 98.1°F | Wt 241.5 lb

## 2017-03-10 DIAGNOSIS — B351 Tinea unguium: Secondary | ICD-10-CM | POA: Diagnosis not present

## 2017-03-10 DIAGNOSIS — E559 Vitamin D deficiency, unspecified: Secondary | ICD-10-CM | POA: Diagnosis not present

## 2017-03-10 DIAGNOSIS — E785 Hyperlipidemia, unspecified: Secondary | ICD-10-CM

## 2017-03-10 DIAGNOSIS — I10 Essential (primary) hypertension: Secondary | ICD-10-CM

## 2017-03-10 DIAGNOSIS — L918 Other hypertrophic disorders of the skin: Secondary | ICD-10-CM

## 2017-03-10 DIAGNOSIS — R7303 Prediabetes: Secondary | ICD-10-CM

## 2017-03-10 MED ORDER — VITAMIN D 50 MCG (2000 UT) PO CAPS: 1.0000 | ORAL_CAPSULE | Freq: Every day | ORAL | Status: DC

## 2017-03-10 MED ORDER — TERBINAFINE HCL 250 MG PO TABS: 250.0000 mg | ORAL_TABLET | Freq: Every day | ORAL | 0 refills | Status: DC

## 2017-03-10 NOTE — Progress Notes (Signed)
BP (!) 156/84 (BP Location: Right Arm, Cuff Size: Large)   Pulse 77   Temp 98.1 F (36.7 C) (Oral)   Wt 241 lb 8 oz (109.5 kg)   SpO2 92%   BMI 38.11 kg/m    CC: 6 mo f/u visit Subjective:    Patient ID: [name], male    DOB: [birth year], 66 y.o.   MRN: [id number]  HPI: [name] is a 66 y.o. male presenting on 03/10/2017 for Follow-up and Nail Problem (fungus)   HTN - Compliant with current antihypertensive regimen of lisinopril 10mg  daily. Does not check blood pressures at home: endorses 130-140.  No low blood pressure readings or symptoms of dizziness/syncope.  Denies HA, vision changes, CP/tightness, SOB, leg swelling.   HLD - compliant with lipitor daily without myalgias.  Prediabetes - A1c improved with weight loss and increased activity over summer.   Requests skin tag removed from R neck - gets stuck on shirt.   Longstanding onycholysis of all fingernails. lamisil treatment last year was helpful but only took this for 6 wks. Requests rpt labwork.  Relevant past medical, surgical, family and social history reviewed and updated as indicated. Interim medical history since our last visit reviewed. Allergies and medications reviewed and updated. Outpatient Medications Prior to Visit  Medication Sig Dispense Refill  . albuterol (PROVENTIL HFA;VENTOLIN HFA) 108 (90 Base) MCG/ACT inhaler Inhale 2 puffs into the lungs every 6 (six) hours as needed for wheezing or shortness of breath. 1 Inhaler 3  . aspirin EC 81 MG tablet Take 81 mg by mouth daily.    [name] atorvastatin (LIPITOR) 10 MG tablet TAKE 1 TABLET BY MOUTH ONCE A DAY 90 tablet 0  . budesonide-formoterol (SYMBICORT) 80-4.5 MCG/ACT inhaler Inhale 2 puffs into the lungs 2 (two) times daily. 10.2 g 6  . lisinopril (PRINIVIL,ZESTRIL) 10 MG tablet TAKE 1 TABLET BY MOUTH ONCE A DAY 90 tablet 0  . nicotine (NICOTROL) 10 MG inhaler Inhale 1 cartridge (1 continuous puffing total) into the lungs as needed for smoking  cessation. 42 each 6  . OVER THE COUNTER MEDICATION Take 1 capsule by mouth daily. [employer] B    . OVER THE COUNTER MEDICATION Take 1 capsule by mouth daily. [employer] (for healthy immune system and to boost liver function    . sildenafil (VIAGRA) 100 MG tablet Take 0.5-1 tablets (50-100 mg total) by mouth daily as needed for erectile dysfunction. 5 tablet 3  . tiotropium (SPIRIVA) 18 MCG inhalation capsule Place 1 capsule (18 mcg total) into inhaler and inhale daily. 30 capsule 11   No facility-administered medications prior to visit.      Per HPI unless specifically indicated in ROS section below Review of Systems     Objective:    BP (!) 156/84 (BP Location: Right Arm, Cuff Size: Large)   Pulse 77   Temp 98.1 F (36.7 C) (Oral)   Wt 241 lb 8 oz (109.5 kg)   SpO2 92%   BMI 38.11 kg/m   Wt Readings from Last 3 Encounters:  03/10/17 241 lb 8 oz (109.5 kg)  09/09/16 249 lb 8 oz (113.2 kg)  06/30/16 251 lb (113.9 kg)    Physical Exam  Constitutional: He appears well-developed and well-nourished. No distress.  HENT:  Head: Normocephalic and atraumatic.  Right Ear: External ear normal.  Left Ear: External ear normal.  Nose: Nose normal.  Mouth/Throat: Oropharynx is clear and moist. No oropharyngeal exudate.  Eyes: Pupils  are equal, round, and reactive to light. Conjunctivae and EOM are normal. No scleral icterus.  Neck: Normal range of motion. Neck supple. No thyromegaly present.  Small skin tag R neck  Cardiovascular: Normal rate, regular rhythm, normal heart sounds and intact distal pulses.   No murmur heard. Pulmonary/Chest: Effort normal and breath sounds normal. No respiratory distress. He has no wheezes. He has no rales.  Musculoskeletal: He exhibits no edema.  See HPI for foot exam if done  Lymphadenopathy:    He has no cervical adenopathy.  Skin: Skin is warm and dry. No rash noted.  Thickened onychomycotic nails of all fingers along with  onycholysis present  Psychiatric: He has a normal mood and affect.  Nursing note and vitals reviewed.  Results for orders placed or performed in visit on 03/08/17  Lipid panel  Result Value Ref Range   Cholesterol 169 0 - 200 mg/dL   Triglycerides 67.0 0.0 - 149.0 mg/dL   HDL 63.30 >39.00 mg/dL   VLDL 13.4 0.0 - 40.0 mg/dL   LDL Cholesterol 93 0 - 99 mg/dL   Total CHOL/HDL Ratio 3    NonHDL 106.01   Comprehensive metabolic panel  Result Value Ref Range   Sodium 135 135 - 145 mEq/L   Potassium 5.0 3.5 - 5.1 mEq/L   Chloride 96 96 - 112 mEq/L   CO2 33 (H) 19 - 32 mEq/L   Glucose, Bld 125 (H) 70 - 99 mg/dL   BUN 14 6 - 23 mg/dL   Creatinine, Ser 0.89 0.40 - 1.50 mg/dL   Total Bilirubin 0.6 0.2 - 1.2 mg/dL   Alkaline Phosphatase 88 39 - 117 U/L   AST 17 0 - 37 U/L   ALT 18 0 - 53 U/L   Total Protein 7.1 6.0 - 8.3 g/dL   Albumin 4.2 3.5 - 5.2 g/dL   Calcium 10.0 8.4 - 10.5 mg/dL   GFR 90.98 >60.00 mL/min  Hemoglobin A1c  Result Value Ref Range   Hgb A1c MFr Bld 6.3 4.6 - 6.5 %  VITAMIN D 25 Hydroxy (Vit-D Deficiency, Fractures)  Result Value Ref Range   VITD 30.79 30.00 - 100.00 ng/mL   Skin tag x1: snipped off using alcohol for cleansing and sterile iris scissors. Local anesthesia was not used. Ethyl chloride numbing spray used. These pathognomonic lesions are not sent for pathology.    Assessment & Plan:   Problem List Items Addressed This Visit    Essential hypertension - Primary    Chronic, above goal. Pt thinks today's reading falsely elevated - will monitor BP at home and let me know if consistently >140/90 to increase ACEI dose.       HLD (hyperlipidemia)    Chronic, stable on low dose lipitor - continue.       Onychomycosis    Of all finger nails. Discussed treatment options. Will treat with lamisil 250mg  daily x 6 wks, return for LFTs. If normal and ongoing treatment needed, will rpt another 6 wk course. Pt agrees.       Relevant Medications   terbinafine  (LAMISIL) 250 MG tablet   Prediabetes    With increased activity and weight loss, A1c has improved to prediabetes range. Will continue to monitor closely.       Skin tag    Skin tag excised, pt tolerated well.       Vitamin D deficiency    Improved during summer months. Recommended 2000 IU daily during winter months.  Follow up plan: Return in about 6 months (around 09/10/2017) for annual exam, prior fasting for blood work, medicare wellness visit.  Ria Bush, MD

## 2017-03-10 NOTE — Assessment & Plan Note (Signed)
Skin tag excised, pt tolerated well.

## 2017-03-10 NOTE — Assessment & Plan Note (Signed)
Chronic, stable on low dose lipitor - continue.

## 2017-03-10 NOTE — Assessment & Plan Note (Signed)
Improved during summer months. Recommended 2000 IU daily during winter months.

## 2017-03-10 NOTE — Patient Instructions (Signed)
Skin tag removed today. Blood pressure staying high - watch at home and let me know if consistently >140/90.  Congratulations on weight loss! Continue health diet and activity even into winter months.  Restart nail medicine. Return in 6 weeks to repeat labs (after first course).  Return as needed or in 6 months for wellness visit.

## 2017-03-10 NOTE — Assessment & Plan Note (Signed)
With increased activity and weight loss, A1c has improved to prediabetes range. Will continue to monitor closely.

## 2017-03-10 NOTE — Assessment & Plan Note (Signed)
Chronic, above goal. Pt thinks today's reading falsely elevated - will monitor BP at home and let me know if consistently >140/90 to increase ACEI dose.

## 2017-03-10 NOTE — Assessment & Plan Note (Signed)
Of all finger nails. Discussed treatment options. Will treat with lamisil 250mg  daily x 6 wks, return for LFTs. If normal and ongoing treatment needed, will rpt another 6 wk course. Pt agrees.

## 2017-05-05 ENCOUNTER — Ambulatory Visit (INDEPENDENT_AMBULATORY_CARE_PROVIDER_SITE_OTHER): Payer: Medicare Other

## 2017-05-05 DIAGNOSIS — Z23 Encounter for immunization: Secondary | ICD-10-CM | POA: Diagnosis not present

## 2017-05-23 ENCOUNTER — Other Ambulatory Visit: Payer: Self-pay | Admitting: Family Medicine

## 2017-08-14 ENCOUNTER — Other Ambulatory Visit: Payer: Self-pay | Admitting: Family Medicine

## 2017-08-21 ENCOUNTER — Telehealth: Payer: Self-pay | Admitting: Family Medicine

## 2017-08-21 NOTE — Telephone Encounter (Signed)
PT dropped off ppw that he received from his ins co concerning inhaler. He would like it reviewed and a call back to discuss options for less expensive meds. I placed in Rx tower.

## 2017-08-28 NOTE — Telephone Encounter (Signed)
I never received this.  

## 2017-08-28 NOTE — Telephone Encounter (Signed)
Received letter from pt and [employer] stating Albuterol Sul HFA 90 mcg inhaler is non-formulary. Placed letter from pt in Dr. Synthia Innocent box.  I will use letter from [facility] to start PA.

## 2017-08-29 MED ORDER — ALBUTEROL SULFATE 108 (90 BASE) MCG/ACT IN AEPB: 2.0000 | INHALATION_SPRAY | Freq: Four times a day (QID) | RESPIRATORY_TRACT | 3 refills | Status: DC | PRN

## 2017-08-29 NOTE — Telephone Encounter (Signed)
Spoke with pt informing him [employer] no longer covers the albuterol inhaler he was using.  Notified him Dr. Darnell Level sent new rx for ProAir Respiclick.  Pt verbalizes understanding.

## 2017-08-29 NOTE — Addendum Note (Signed)
Addended by: Ria Bush on: 08/29/2017 07:38 AM   Modules accepted: Orders

## 2017-08-29 NOTE — Telephone Encounter (Addendum)
We need to see what alternative albuterol formulation is covered. ?ventolin, proair, preventil? Any covered albuterol is appropriate.  I have sent in proair to try.

## 2017-09-06 ENCOUNTER — Other Ambulatory Visit: Payer: Self-pay | Admitting: Family Medicine

## 2017-09-06 DIAGNOSIS — E785 Hyperlipidemia, unspecified: Secondary | ICD-10-CM

## 2017-09-06 DIAGNOSIS — R7303 Prediabetes: Secondary | ICD-10-CM

## 2017-09-06 DIAGNOSIS — N400 Enlarged prostate without lower urinary tract symptoms: Secondary | ICD-10-CM

## 2017-09-06 DIAGNOSIS — E559 Vitamin D deficiency, unspecified: Secondary | ICD-10-CM

## 2017-09-07 ENCOUNTER — Other Ambulatory Visit (INDEPENDENT_AMBULATORY_CARE_PROVIDER_SITE_OTHER): Payer: Medicare Other

## 2017-09-07 DIAGNOSIS — E785 Hyperlipidemia, unspecified: Secondary | ICD-10-CM

## 2017-09-07 DIAGNOSIS — E559 Vitamin D deficiency, unspecified: Secondary | ICD-10-CM

## 2017-09-07 DIAGNOSIS — R7303 Prediabetes: Secondary | ICD-10-CM

## 2017-09-07 DIAGNOSIS — N400 Enlarged prostate without lower urinary tract symptoms: Secondary | ICD-10-CM

## 2017-09-07 LAB — COMPREHENSIVE METABOLIC PANEL
ALT: 19 U/L (ref 0–53)
AST: 16 U/L (ref 0–37)
Albumin: 4.3 g/dL (ref 3.5–5.2)
Alkaline Phosphatase: 89 U/L (ref 39–117)
BUN: 9 mg/dL (ref 6–23)
CO2: 35 mEq/L — ABNORMAL HIGH (ref 19–32)
Calcium: 9.7 mg/dL (ref 8.4–10.5)
Chloride: 96 mEq/L (ref 96–112)
Creatinine, Ser: 0.89 mg/dL (ref 0.40–1.50)
GFR: 90.84 mL/min (ref >60.00)
Glucose, Bld: 123 mg/dL — ABNORMAL HIGH (ref 70–99)
Potassium: 5.1 mEq/L (ref 3.5–5.1)
Sodium: 134 mEq/L — ABNORMAL LOW (ref 135–145)
Total Bilirubin: 0.9 mg/dL (ref 0.2–1.2)
Total Protein: 6.9 g/dL (ref 6.0–8.3)

## 2017-09-07 LAB — LIPID PANEL
Cholesterol: 159 mg/dL (ref 0–200)
HDL: 78.8 mg/dL (ref >39.00)
LDL Cholesterol: 67 mg/dL (ref 0–99)
NonHDL: 79.93
Total CHOL/HDL Ratio: 2
Triglycerides: 63 mg/dL (ref 0.0–149.0)
VLDL: 12.6 mg/dL (ref 0.0–40.0)

## 2017-09-07 LAB — VITAMIN D 25 HYDROXY (VIT D DEFICIENCY, FRACTURES): VITD: 32.92 ng/mL (ref 30.00–100.00)

## 2017-09-07 LAB — PSA: PSA: 1.09 ng/mL (ref 0.10–4.00)

## 2017-09-07 LAB — HEMOGLOBIN A1C: Hgb A1c MFr Bld: 6.7 % — ABNORMAL HIGH (ref 4.6–6.5)

## 2017-09-11 ENCOUNTER — Encounter: Payer: Self-pay | Admitting: Family Medicine

## 2017-09-11 ENCOUNTER — Ambulatory Visit (INDEPENDENT_AMBULATORY_CARE_PROVIDER_SITE_OTHER): Payer: Medicare Other | Admitting: Family Medicine

## 2017-09-11 VITALS — BP 148/80 | HR 78 | Temp 97.9°F | Ht 65.0 in | Wt 238.0 lb

## 2017-09-11 DIAGNOSIS — E118 Type 2 diabetes mellitus with unspecified complications: Secondary | ICD-10-CM

## 2017-09-11 DIAGNOSIS — Z0000 Encounter for general adult medical examination without abnormal findings: Secondary | ICD-10-CM | POA: Diagnosis not present

## 2017-09-11 DIAGNOSIS — E559 Vitamin D deficiency, unspecified: Secondary | ICD-10-CM

## 2017-09-11 DIAGNOSIS — R0600 Dyspnea, unspecified: Secondary | ICD-10-CM

## 2017-09-11 DIAGNOSIS — Z720 Tobacco use: Secondary | ICD-10-CM

## 2017-09-11 DIAGNOSIS — J449 Chronic obstructive pulmonary disease, unspecified: Secondary | ICD-10-CM | POA: Diagnosis not present

## 2017-09-11 DIAGNOSIS — I10 Essential (primary) hypertension: Secondary | ICD-10-CM | POA: Diagnosis not present

## 2017-09-11 DIAGNOSIS — I2510 Atherosclerotic heart disease of native coronary artery without angina pectoris: Secondary | ICD-10-CM | POA: Diagnosis not present

## 2017-09-11 DIAGNOSIS — I700 Atherosclerosis of aorta: Secondary | ICD-10-CM | POA: Diagnosis not present

## 2017-09-11 DIAGNOSIS — Z7289 Other problems related to lifestyle: Secondary | ICD-10-CM | POA: Diagnosis not present

## 2017-09-11 DIAGNOSIS — Z1211 Encounter for screening for malignant neoplasm of colon: Secondary | ICD-10-CM

## 2017-09-11 DIAGNOSIS — Z7189 Other specified counseling: Secondary | ICD-10-CM

## 2017-09-11 DIAGNOSIS — E785 Hyperlipidemia, unspecified: Secondary | ICD-10-CM

## 2017-09-11 DIAGNOSIS — F1090 Alcohol use, unspecified, uncomplicated: Secondary | ICD-10-CM

## 2017-09-11 MED ORDER — NICOTINE 10 MG IN INHA: 1.0000 | RESPIRATORY_TRACT | 1 refills | Status: DC | PRN

## 2017-09-11 MED ORDER — TIOTROPIUM BROMIDE MONOHYDRATE 18 MCG IN CAPS: 18.0000 ug | ORAL_CAPSULE | Freq: Every day | RESPIRATORY_TRACT | 11 refills | Status: DC

## 2017-09-11 MED ORDER — LISINOPRIL-HYDROCHLOROTHIAZIDE 10-12.5 MG PO TABS: 1.0000 | ORAL_TABLET | Freq: Every day | ORAL | 3 refills | Status: DC

## 2017-09-11 NOTE — Assessment & Plan Note (Addendum)
A1c deteriorated again recently despite ongoing weight loss. Reviewed diet changes to maintain good glycemic control. Recheck in 6 months.

## 2017-09-11 NOTE — Assessment & Plan Note (Signed)
He is slowly cutting down - congratulated. Down to 4 beers/day. Reviewed health benefits of decreased alcohol intake. Will need to monitor LFTs.

## 2017-09-11 NOTE — Assessment & Plan Note (Signed)
Levels stable off replacement.  

## 2017-09-11 NOTE — Assessment & Plan Note (Signed)
Chronic. Has not returned to pulm recently. He is compliant with symbicort. He is using albuterol daily. He ran out of spiriva and did not refill. Advised restart this - refilled.

## 2017-09-11 NOTE — Patient Instructions (Addendum)
Pass by lab to pick up stool kit. Work on smoking cessation. I've sent in Rx for nicotrol inhaler to help you quit smoking.  If interested, check with pharmacy about new 2 shot shingles series (shingrix).  Restart spiriva inhaler.  Watch blood pressures at home. Bring bp cuff to next visit.  Blood pressure was elevated - increase medicine to lisinopril/hydrochlorothiazide.  Good to see you today, call us with questions. Return in 6 months for follow up visit, sooner if needed.  Health Maintenance, Male A healthy lifestyle and preventive care is important for your health and wellness. Ask your health care provider about what schedule of regular examinations is right for you. What should I know about weight and diet? Eat a Healthy Diet  Eat plenty of vegetables, fruits, whole grains, low-fat dairy products, and lean protein.  Do not eat a lot of foods high in solid fats, added sugars, or salt.  Maintain a Healthy Weight Regular exercise can help you achieve or maintain a healthy weight. You should:  Do at least 150 minutes of exercise each week. The exercise should increase your heart rate and make you sweat (moderate-intensity exercise).  Do strength-training exercises at least twice a week.  Watch Your Levels of Cholesterol and Blood Lipids  Have your blood tested for lipids and cholesterol every 5 years starting at 67 years of age. If you are at high risk for heart disease, you should start having your blood tested when you are 67 years old. You may need to have your cholesterol levels checked more often if: ? Your lipid or cholesterol levels are high. ? You are older than 67 years of age. ? You are at high risk for heart disease.  What should I know about cancer screening? Many types of cancers can be detected early and may often be prevented. Lung Cancer  You should be screened every year for lung cancer if: ? You are a current smoker who has smoked for at least 30  years. ? You are a former smoker who has quit within the past 15 years.  Talk to your health care provider about your screening options, when you should start screening, and how often you should be screened.  Colorectal Cancer  Routine colorectal cancer screening usually begins at 67 years of age and should be repeated every 5-10 years until you are 67 years old. You may need to be screened more often if early forms of precancerous polyps or small growths are found. Your health care provider may recommend screening at an earlier age if you have risk factors for colon cancer.  Your health care provider may recommend using home test kits to check for hidden blood in the stool.  A small camera at the end of a tube can be used to examine your colon (sigmoidoscopy or colonoscopy). This checks for the earliest forms of colorectal cancer.  Prostate and Testicular Cancer  Depending on your age and overall health, your health care provider may do certain tests to screen for prostate and testicular cancer.  Talk to your health care provider about any symptoms or concerns you have about testicular or prostate cancer.  Skin Cancer  Check your skin from head to toe regularly.  Tell your health care provider about any new moles or changes in moles, especially if: ? There is a change in a mole's size, shape, or color. ? You have a mole that is larger than a pencil eraser.  Always use sunscreen. Apply sunscreen  liberally and repeat throughout the day.  Protect yourself by wearing long sleeves, pants, a wide-brimmed hat, and sunglasses when outside.  What should I know about heart disease, diabetes, and high blood pressure?  If you are 87-38 years of age, have your blood pressure checked every 3-5 years. If you are 89 years of age or older, have your blood pressure checked every year. You should have your blood pressure measured twice-once when you are at a hospital or clinic, and once when you are  not at a hospital or clinic. Record the average of the two measurements. To check your blood pressure when you are not at a hospital or clinic, you can use: ? An automated blood pressure machine at a pharmacy. ? A home blood pressure monitor.  Talk to your health care provider about your target blood pressure.  If you are between 11-56 years old, ask your health care provider if you should take aspirin to prevent heart disease.  Have regular diabetes screenings by checking your fasting blood sugar level. ? If you are at a normal weight and have a low risk for diabetes, have this test once every three years after the age of 71. ? If you are overweight and have a high risk for diabetes, consider being tested at a younger age or more often.  A one-time screening for abdominal aortic aneurysm (AAA) by ultrasound is recommended for men aged 55-75 years who are current or former smokers. What should I know about preventing infection? Hepatitis B If you have a higher risk for hepatitis B, you should be screened for this virus. Talk with your health care provider to find out if you are at risk for hepatitis B infection. Hepatitis C Blood testing is recommended for:  Everyone born from [birth year] through 1965.  Anyone with known risk factors for hepatitis C.  Sexually Transmitted Diseases (STDs)  You should be screened each year for STDs including gonorrhea and chlamydia if: ? You are sexually active and are younger than 67 years of age. ? You are older than 67 years of age and your health care provider tells you that you are at risk for this type of infection. ? Your sexual activity has changed since you were last screened and you are at an increased risk for chlamydia or gonorrhea. Ask your health care provider if you are at risk.  Talk with your health care provider about whether you are at high risk of being infected with HIV. Your health care provider may recommend a prescription medicine to help  prevent HIV infection.  What else can I do?  Schedule regular health, dental, and eye exams.  Stay current with your vaccines (immunizations).  Do not use any tobacco products, such as cigarettes, chewing tobacco, and e-cigarettes. If you need help quitting, ask your health care provider.  Limit alcohol intake to no more than 2 drinks per day. One drink equals 12 ounces of beer, 5 ounces of wine, or 1 ounces of hard liquor.  Do not use street drugs.  Do not share needles.  Ask your health care provider for help if you need support or information about quitting drugs.  Tell your health care provider if you often feel depressed.  Tell your health care provider if you have ever been abused or do not feel safe at home. This information is not intended to replace advice given to you by your health care provider. Make sure you discuss any questions you have with your  health care provider. Document Released: 12/31/2007 Document Revised: 03/02/2016 Document Reviewed: 04/07/2015 Elsevier Interactive Patient Education  [employer].

## 2017-09-11 NOTE — Progress Notes (Signed)
BP (!) 148/80 (BP Location: Right Arm, Cuff Size: Large)   Pulse 78   Temp 97.9 F (36.6 C) (Oral)   Ht 5\' 5"  (1.651 m)   Wt 238 lb (108 kg)   SpO2 94%   BMI 39.61 kg/m    Hearing Screening   125Hz  250Hz  500Hz  1000Hz  2000Hz  3000Hz  4000Hz  6000Hz  8000Hz   Right ear:   20 25 20   0    Left ear:   20 20 20   0      Visual Acuity Screening   Right eye Left eye Both eyes  Without correction:     With correction: 20/20 20/20 20/15   bp on recheck 168/80 CC: medicare wellness visit Subjective:    Patient ID: [name], male    DOB: 08-28-1950, 67 y.o.   MRN: [id number]  HPI: [name] is a 67 y.o. male presenting on 09/11/2017 for Medicare Wellness   Increased dyspnea noted. Poor exercise. Continues smoking 1/2 ppd. Has tried chantix (HAs), nicotrol inhaler.  Passes depression screen No falls in the past year  Preventative: Colonoscopy 09/23/2010 - polyps, diverticula, hemorrhoids. Rpt 5 yrs [place]). Requests iFOB this year.  Prostate - normal DRE/PSA in past. Nocturia x3-4. Strong stream. Q40yr check today. normal 2018.  Lung cancer screening - discussed - interested. Will refer to lung cancer screening nurse.  AAA screen - poor study, no AAA noted. Flu shot - 03/2015 Pneumovax - 2015, prevnar 2018 Tdap 2014 zostavax - 09/2013 shingrix - discussed Advanced directives: has at home. HCPOA is daughter, [name]. Will bring [place] copy  Seat belt use discussed. Sunscreen use discussed. No changing moles on skin.  Smoking 1/2 ppd. Using nicotrol inhaler Alcohol - 4 beers/day  Lives with wife. Has 1 son, 2 daughters.  Occupation: Psychologist, clinical.  Activity: no regular exercise. Treadmill - not regularly using.  Diet: no water, fruits/vegetables daily  Relevant past medical, surgical, family and social history reviewed and updated as indicated. Interim medical history since our last visit reviewed. Allergies and medications reviewed and updated. Outpatient Medications  Prior to Visit  Medication Sig Dispense Refill  . Albuterol Sulfate (PROAIR RESPICLICK) 782 (90 Base) MCG/ACT AEPB Inhale 2 puffs into the lungs every 6 (six) hours as needed (cough, shortness of breath). 1 each 3  . aspirin EC 81 MG tablet Take 81 mg by mouth daily.    [name] atorvastatin (LIPITOR) 10 MG tablet TAKE 1 TABLET BY MOUTH ONCE DAILY 90 tablet 1  . budesonide-formoterol (SYMBICORT) 80-4.5 MCG/ACT inhaler Inhale 1 puff into the lungs 2 (two) times daily.    [name] OVER THE COUNTER MEDICATION Take 1 capsule by mouth daily. Young [employer] B    . OVER THE COUNTER MEDICATION Take 1 capsule by mouth daily. [employer] (for healthy immune system and to boost liver function    . lisinopril (PRINIVIL,ZESTRIL) 10 MG tablet TAKE 1 TABLET BY MOUTH ONCE DAILY 90 tablet 1  . nicotine (NICOTROL) 10 MG inhaler Inhale 1 cartridge (1 continuous puffing total) into the lungs as needed for smoking cessation. 42 each 6  . SYMBICORT 80-4.5 MCG/ACT inhaler INHALE 2 PUFFS INTO THE LUNGS DAILY AS NEEDED 10.2 g 6  . albuterol (PROVENTIL HFA;VENTOLIN HFA) 108 (90 Base) MCG/ACT inhaler Inhale 2 puffs into the lungs every 6 (six) hours as needed for wheezing or shortness of breath. 1 Inhaler 3  . Cholecalciferol (VITAMIN D) 2000 units CAPS Take 1 capsule (2,000 Units total) by mouth daily. During winter months    .  sildenafil (VIAGRA) 100 MG tablet Take 0.5-1 tablets (50-100 mg total) by mouth daily as needed for erectile dysfunction. 5 tablet 3  . terbinafine (LAMISIL) 250 MG tablet Take 1 tablet (250 mg total) by mouth daily. 45 tablet 0  . tiotropium (SPIRIVA) 18 MCG inhalation capsule Place 1 capsule (18 mcg total) into inhaler and inhale daily. 30 capsule 11   No facility-administered medications prior to visit.      Per HPI unless specifically indicated in ROS section below Review of Systems  Cardiovascular: Positive for leg swelling (noticing more frequently).       Objective:    BP (!)  148/80 (BP Location: Right Arm, Cuff Size: Large)   Pulse 78   Temp 97.9 F (36.6 C) (Oral)   Ht 5\' 5"  (1.651 m)   Wt 238 lb (108 kg)   SpO2 94%   BMI 39.61 kg/m   Wt Readings from Last 3 Encounters:  09/11/17 238 lb (108 kg)  03/10/17 241 lb 8 oz (109.5 kg)  09/09/16 249 lb 8 oz (113.2 kg)    BP Readings from Last 3 Encounters:  09/11/17 (!) 148/80  03/10/17 (!) 156/84  09/09/16 (!) 146/84    Physical Exam  Constitutional: He is oriented to person, place, and time. He appears well-developed and well-nourished. No distress.  HENT:  Head: Normocephalic and atraumatic.  Right Ear: Hearing, tympanic membrane, external ear and ear canal normal.  Left Ear: Hearing, tympanic membrane, external ear and ear canal normal.  Nose: Nose normal.  Mouth/Throat: Uvula is midline, oropharynx is clear and moist and mucous membranes are normal. No oropharyngeal exudate, posterior oropharyngeal edema or posterior oropharyngeal erythema.  Eyes: Conjunctivae and EOM are normal. Pupils are equal, round, and reactive to light. No scleral icterus.  Neck: Normal range of motion. Neck supple. Carotid bruit is not present. No thyromegaly present.  Cardiovascular: Normal rate, regular rhythm, normal heart sounds and intact distal pulses.  No murmur heard. Pulses:      Radial pulses are 2+ on the right side, and 2+ on the left side.  Pulmonary/Chest: Effort normal and breath sounds normal. No respiratory distress. He has no wheezes. He has no rales.  Abdominal: Soft. Bowel sounds are normal. He exhibits no distension and no mass. There is no tenderness. There is no rebound and no guarding.  Obese abdomen  Musculoskeletal: Normal range of motion. He exhibits no edema.  Lymphadenopathy:    He has no cervical adenopathy.  Neurological: He is alert and oriented to person, place, and time.  CN grossly intact, station and gait intact Recall 3/3 Calculation 5/5 D-[id number]  Skin: Skin is warm and dry. No rash  noted.  Psychiatric: He has a normal mood and affect. His behavior is normal. Judgment and thought content normal.  Nursing note and vitals reviewed.  Diabetic Foot Exam - Simple   Simple Foot Form Diabetic Foot exam was performed with the following findings:  Yes 09/11/2017 10:29 AM  Visual Inspection No deformities, no ulcerations, no other skin breakdown bilaterally:  Yes Sensation Testing See comments:  Yes Pulse Check See comments:  Yes Comments Slightly diminished DP bilaterally 1+ Diminished sensation to monofilament testing at dorsal feet     Results for orders placed or performed in visit on 09/07/17  VITAMIN D 25 Hydroxy (Vit-D Deficiency, Fractures)  Result Value Ref Range   VITD 32.92 30.00 - 100.00 ng/mL  PSA  Result Value Ref Range   PSA 1.09 0.10 - 4.00 ng/mL  Hemoglobin A1c  Result Value Ref Range   Hgb A1c MFr Bld 6.7 (H) 4.6 - 6.5 %  Comprehensive metabolic panel  Result Value Ref Range   Sodium 134 (L) 135 - 145 mEq/L   Potassium 5.1 3.5 - 5.1 mEq/L   Chloride 96 96 - 112 mEq/L   CO2 35 (H) 19 - 32 mEq/L   Glucose, Bld 123 (H) 70 - 99 mg/dL   BUN 9 6 - 23 mg/dL   Creatinine, Ser 0.89 0.40 - 1.50 mg/dL   Total Bilirubin 0.9 0.2 - 1.2 mg/dL   Alkaline Phosphatase 89 39 - 117 U/L   AST 16 0 - 37 U/L   ALT 19 0 - 53 U/L   Total Protein 6.9 6.0 - 8.3 g/dL   Albumin 4.3 3.5 - 5.2 g/dL   Calcium 9.7 8.4 - 10.5 mg/dL   GFR 90.84 >60.00 mL/min  Lipid panel  Result Value Ref Range   Cholesterol 159 0 - 200 mg/dL   Triglycerides 63.0 0.0 - 149.0 mg/dL   HDL 78.80 >39.00 mg/dL   VLDL 12.6 0.0 - 40.0 mg/dL   LDL Cholesterol 67 0 - 99 mg/dL   Total CHOL/HDL Ratio 2    NonHDL 79.93       Assessment & Plan:   Problem List Items Addressed This Visit    Advanced care planning/counseling discussion    Advanced directives: has at home. HCPOA is daughter, [name]. Will bring [place] copy       Aortic atherosclerosis ([place])    Continue aspirin, statin.        Relevant Medications   lisinopril-hydrochlorothiazide (PRINZIDE,ZESTORETIC) 10-12.5 MG tablet   CAD (coronary artery disease)    Continue aspirin, statin.      Relevant Medications   lisinopril-hydrochlorothiazide (PRINZIDE,ZESTORETIC) 10-12.5 MG tablet   Controlled diabetes mellitus type 2 with complications (HCC)    H6W deteriorated again recently despite ongoing weight loss. Reviewed diet changes to maintain good glycemic control. Recheck in 6 months.       Relevant Medications   lisinopril-hydrochlorothiazide (PRINZIDE,ZESTORETIC) 10-12.5 MG tablet   COPD (chronic obstructive pulmonary disease) (HCC)    Chronic. Has not returned to pulm recently. He is compliant with symbicort. He is using albuterol daily. He ran out of spiriva and did not refill. Advised restart this - refilled.       Relevant Medications   nicotine (NICOTROL) 10 MG inhaler   budesonide-formoterol (SYMBICORT) 80-4.5 MCG/ACT inhaler   tiotropium (SPIRIVA) 18 MCG inhalation capsule   Dyspnea   Essential hypertension    Chronic, deteriorated. Will start lisinopril/hctz combo pill in place of plain lisinopril.  RTC 6 mo f/u visit. I also asked him to bring BP cuff to next visit to compare.      Relevant Medications   lisinopril-hydrochlorothiazide (PRINZIDE,ZESTORETIC) 10-12.5 MG tablet   Habitual alcohol use    He is slowly cutting down - congratulated. Down to 4 beers/day. Reviewed health benefits of decreased alcohol intake. Will need to monitor LFTs.      HLD (hyperlipidemia)    Chronic, stable. Continue low dose lipitor.       Relevant Medications   lisinopril-hydrochlorothiazide (PRINZIDE,ZESTORETIC) 10-12.5 MG tablet   Initial Medicare annual wellness visit - Primary    I have personally reviewed the Medicare Annual Wellness questionnaire and have noted 1. The patient's medical and social history 2. Their use of alcohol, tobacco or illicit drugs 3. Their current medications and supplements 4. The  patient's functional ability including ADL's,  fall risks, home safety risks and hearing or visual impairment. Cognitive function has been assessed and addressed as indicated.  5. Diet and physical activity 6. Evidence for depression or mood disorders The patients weight, height, BMI have been recorded in the chart. I have made referrals, counseling and provided education to the patient based on review of the above and I have provided the pt with a written personalized care plan for preventive services. Provider list updated.. See scanned questionairre as needed for further documentation. Reviewed preventative protocols and updated unless pt declined.       Severe obesity (BMI 35.0-39.9) with comorbidity ([place])    Reviewed healthy diet and lifestyle changes to affect sustainable weight loss.       Tobacco abuse    Continue to encourage cessation. Contemplative. He has not tolerated chantix in the past. Reviewed nicotine replacement therapies - will refill nicotrol and he will continue ongoing efforts at complete cessation.  Has started low dose lung cancer screening CT program.      Vitamin D deficiency    Levels stable off replacement.        Other Visit Diagnoses    Special screening for malignant neoplasms, colon       Relevant Orders   Fecal occult blood, imunochemical       Meds ordered this encounter  Medications  . nicotine (NICOTROL) 10 MG inhaler    Sig: Inhale 1 Cartridge (1 continuous puffing total) into the lungs as needed for smoking cessation.    Dispense:  42 each    Refill:  1  . lisinopril-hydrochlorothiazide (PRINZIDE,ZESTORETIC) 10-12.5 MG tablet    Sig: Take 1 tablet by mouth daily.    Dispense:  90 tablet    Refill:  3  . tiotropium (SPIRIVA) 18 MCG inhalation capsule    Sig: Place 1 capsule (18 mcg total) into inhaler and inhale daily.    Dispense:  30 capsule    Refill:  11   Orders Placed This Encounter  Procedures  . Fecal occult blood,  imunochemical    Standing Status:   Future    Standing Expiration Date:   09/11/2018    Follow up plan: Return in about 6 months (around 03/11/2018) for follow up visit.  Ria Bush, MD

## 2017-09-11 NOTE — Assessment & Plan Note (Signed)
Reviewed healthy diet and lifestyle changes to affect sustainable weight loss.  

## 2017-09-11 NOTE — Assessment & Plan Note (Signed)

## 2017-09-11 NOTE — Assessment & Plan Note (Addendum)
Continue to encourage cessation. Contemplative. He has not tolerated chantix in the past. Reviewed nicotine replacement therapies - will refill nicotrol and he will continue ongoing efforts at complete cessation.  Has started low dose lung cancer screening CT program.

## 2017-09-11 NOTE — Assessment & Plan Note (Signed)
Advanced directives: has at home. HCPOA is daughter, Crystal. Will bring us copy.  

## 2017-09-11 NOTE — Assessment & Plan Note (Signed)
Chronic, stable. Continue low dose lipitor.  

## 2017-09-11 NOTE — Assessment & Plan Note (Signed)
Continue aspirin, statin.  

## 2017-09-11 NOTE — Assessment & Plan Note (Signed)
Chronic, deteriorated. Will start lisinopril/hctz combo pill in place of plain lisinopril.  RTC 6 mo f/u visit. I also asked him to bring BP cuff to next visit to compare.

## 2017-09-15 ENCOUNTER — Other Ambulatory Visit: Payer: Self-pay | Admitting: Family Medicine

## 2017-09-20 ENCOUNTER — Other Ambulatory Visit (INDEPENDENT_AMBULATORY_CARE_PROVIDER_SITE_OTHER): Payer: Medicare Other

## 2017-09-20 DIAGNOSIS — Z1211 Encounter for screening for malignant neoplasm of colon: Secondary | ICD-10-CM | POA: Diagnosis not present

## 2017-09-20 LAB — FECAL OCCULT BLOOD, GUAIAC: Fecal Occult Blood: NEGATIVE

## 2017-09-20 LAB — FECAL OCCULT BLOOD, IMMUNOCHEMICAL: Fecal Occult Bld: NEGATIVE

## 2017-09-21 ENCOUNTER — Encounter: Payer: Self-pay | Admitting: Family Medicine

## 2017-10-12 ENCOUNTER — Telehealth: Payer: Self-pay | Admitting: *Deleted

## 2017-10-12 NOTE — Telephone Encounter (Signed)
Left message for patient to notify them that it is time to schedule annual low dose lung cancer screening CT scan. Instructed patient to call back to verify information prior to the scan being scheduled.  

## 2017-10-13 ENCOUNTER — Telehealth: Payer: Self-pay | Admitting: *Deleted

## 2017-10-13 ENCOUNTER — Other Ambulatory Visit: Payer: Self-pay | Admitting: *Deleted

## 2017-10-13 DIAGNOSIS — Z87891 Personal history of nicotine dependence: Secondary | ICD-10-CM

## 2017-10-13 DIAGNOSIS — Z122 Encounter for screening for malignant neoplasm of respiratory organs: Secondary | ICD-10-CM

## 2017-10-13 NOTE — Telephone Encounter (Signed)
Notified patient that annual lung cancer screening low dose CT scan is due currently or will be in near future. Confirmed that patient is within the age range of 55-77, and asymptomatic, (no signs or symptoms of lung cancer). Patient denies illness that would prevent curative treatment for lung cancer if found. Verified smoking history, (current, 30.75 pack year). The shared decision making visit was done 09/27/16. Patient is agreeable for CT scan being scheduled.

## 2017-10-25 ENCOUNTER — Ambulatory Visit
Admission: RE | Admit: 2017-10-25 | Discharge: 2017-10-25 | Disposition: A | Discharge status: Home / Self Care | Payer: Medicare Other | Source: Ambulatory Visit | Attending: Oncology | Admitting: Oncology

## 2017-10-25 DIAGNOSIS — Z122 Encounter for screening for malignant neoplasm of respiratory organs: Secondary | ICD-10-CM | POA: Diagnosis not present

## 2017-10-25 DIAGNOSIS — Z87891 Personal history of nicotine dependence: Secondary | ICD-10-CM | POA: Insufficient documentation

## 2017-10-25 DIAGNOSIS — I2510 Atherosclerotic heart disease of native coronary artery without angina pectoris: Secondary | ICD-10-CM | POA: Diagnosis not present

## 2017-10-25 DIAGNOSIS — F17210 Nicotine dependence, cigarettes, uncomplicated: Secondary | ICD-10-CM | POA: Diagnosis not present

## 2017-10-25 DIAGNOSIS — I700 Atherosclerosis of aorta: Secondary | ICD-10-CM | POA: Diagnosis not present

## 2017-10-30 ENCOUNTER — Encounter: Payer: Self-pay | Admitting: *Deleted

## 2017-11-05 IMAGING — CT CT CHEST LUNG CANCER SCREENING LOW DOSE W/O CM
1 of 2 series · 15 of 40 positions shown, 19 images · non-contrast
Comparison: None

CLINICAL DATA: Lung cancer screening.Current asymptomatic smoker.
Thirty pack-year history.

EXAM:
CT CHEST WITHOUT CONTRAST LOW-DOSE FOR LUNG CANCER SCREENING
TECHNIQUE: Multidetector CT imaging of the chest was performed following the
standard protocol without IV contrast.

[Series 2: axial st · axial · 0.82mm/px · z∈[-259,-4]mm · 15 of 57 slices shown, 19 images]
[im 3/57  mediastinal]
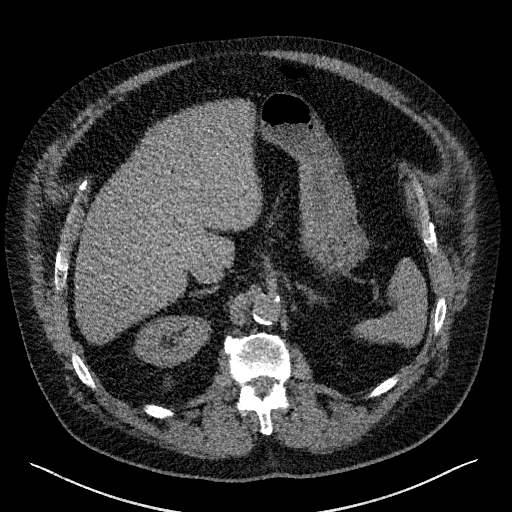
[im 3/57  lung]
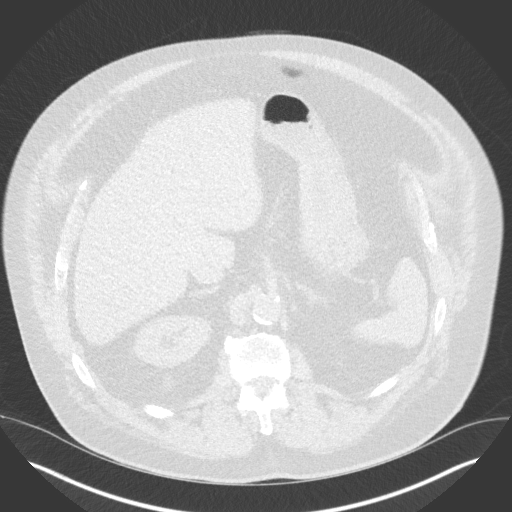
[im 7/57  lung]
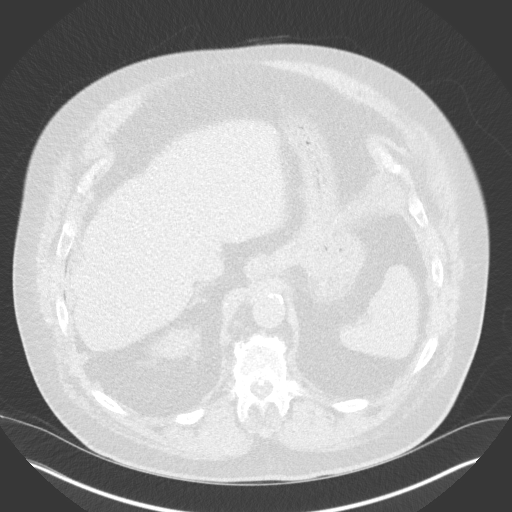
[im 11/57  lung]
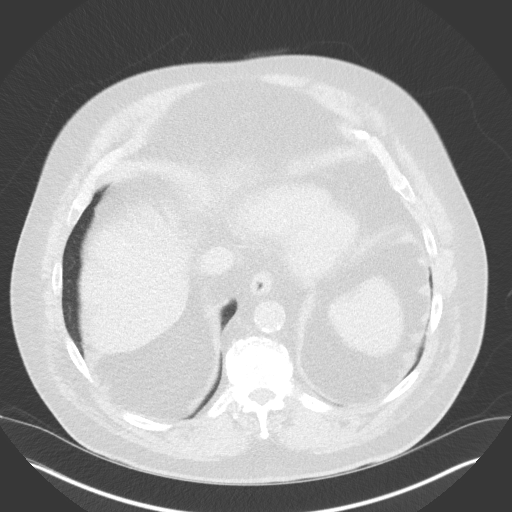
[im 15/57  lung]
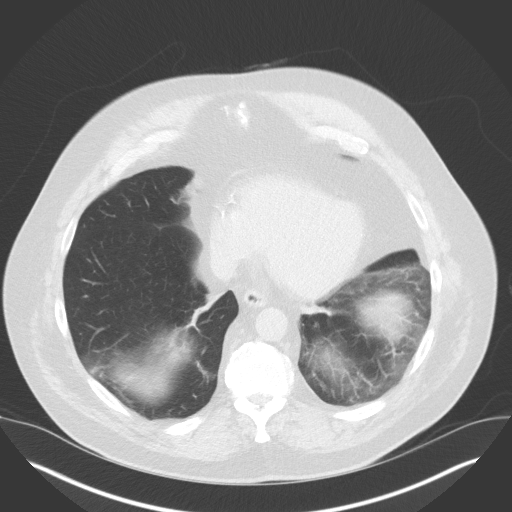
[im 18/57  mediastinal]
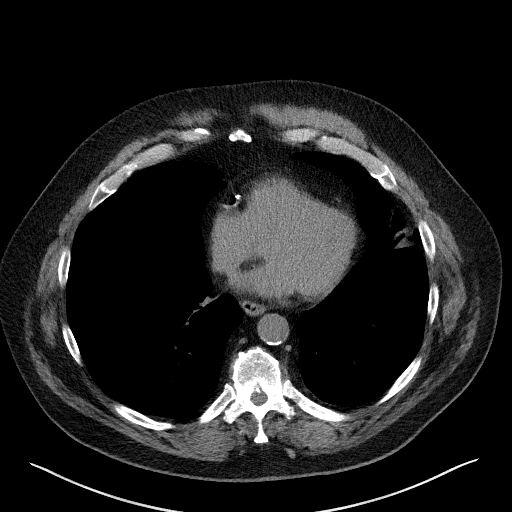
[im 18/57  lung]
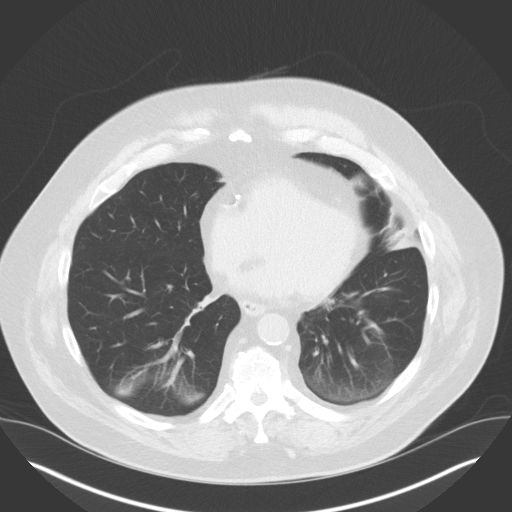
[im 22/57  lung]
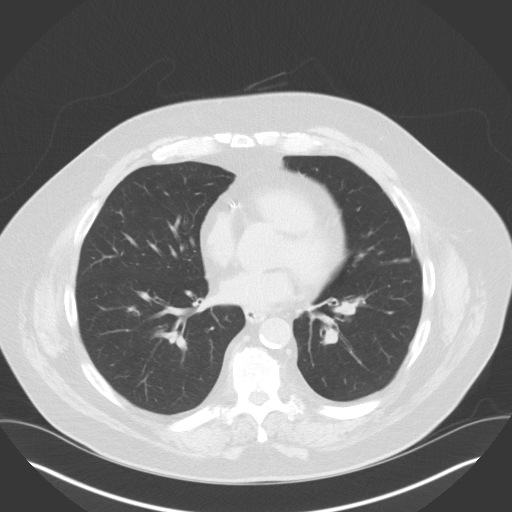
[im 26/57  lung]
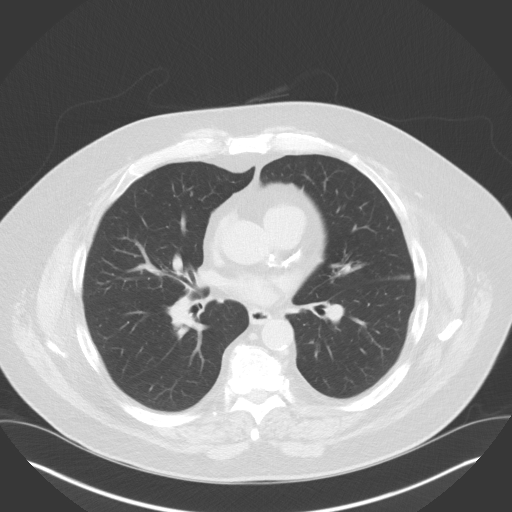
[im 29/57  lung]
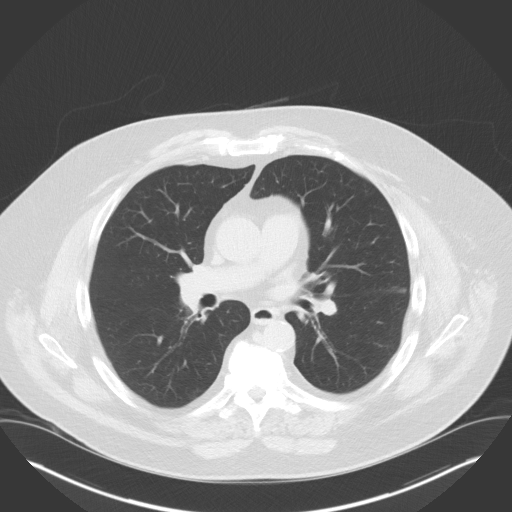
[im 31/57  mediastinal]
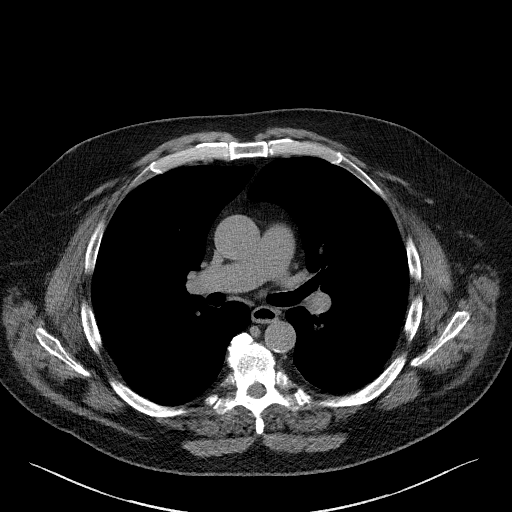
[im 31/57  lung]
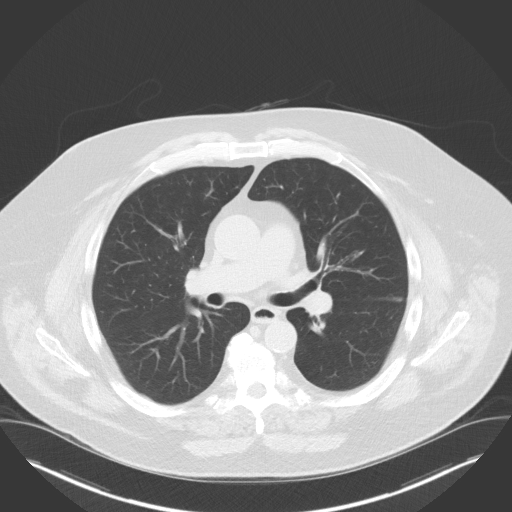
[im 35/57  lung]
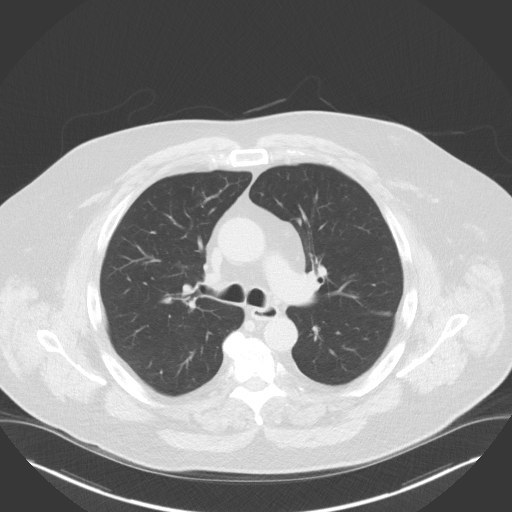
[im 39/57  lung]
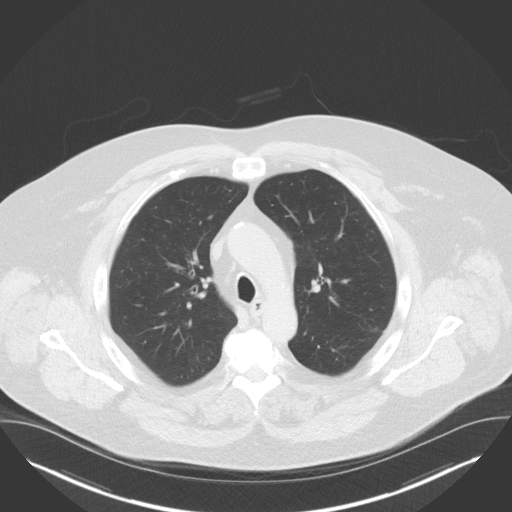
[im 43/57  lung]
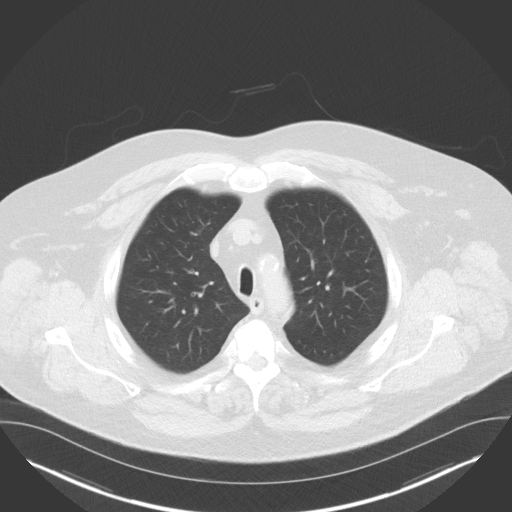
[im 46/57  mediastinal]
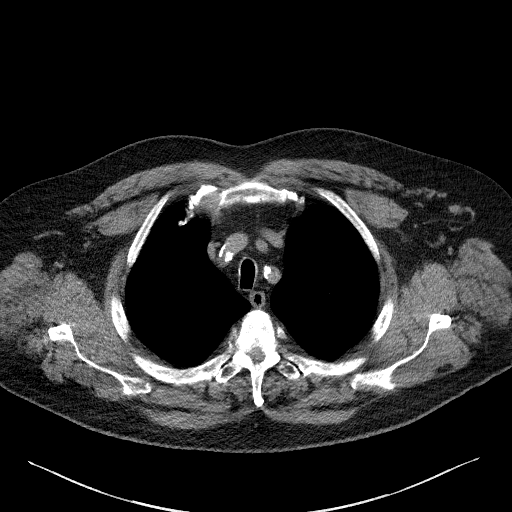
[im 46/57  lung]
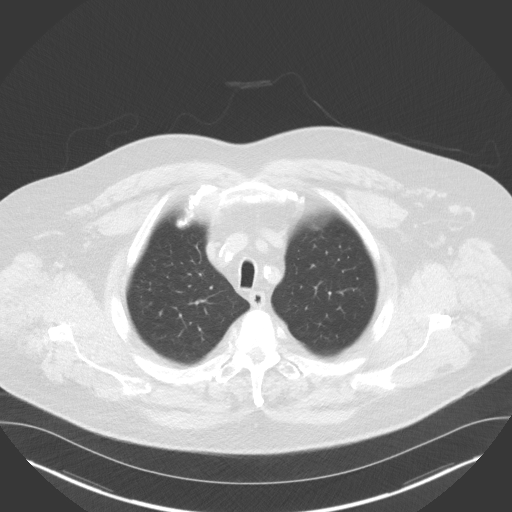
[im 50/57  lung]
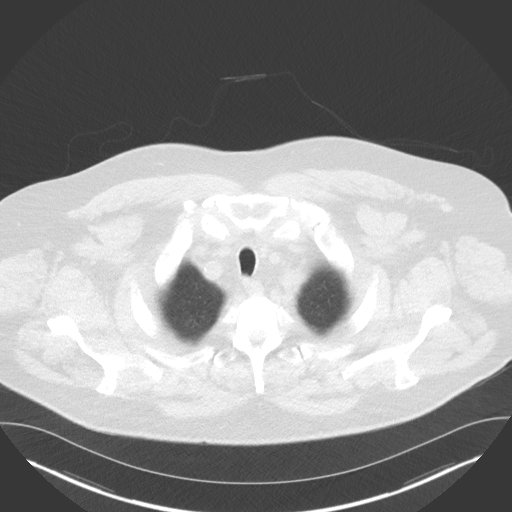
[im 54/57  lung]
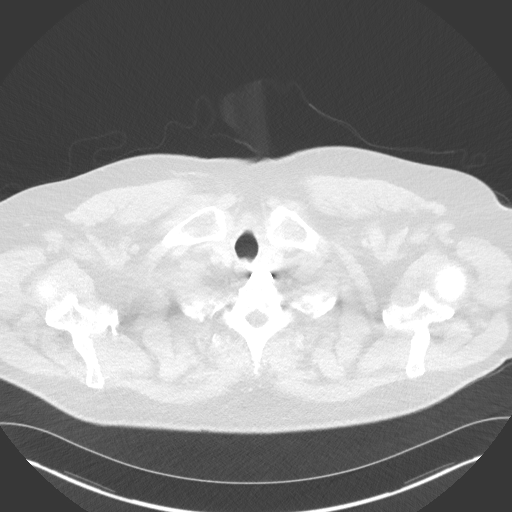

[15 of 40 positions shown; findings below may reference images not displayed]

FINDINGS: Cardiovascular: The heart size appears normal. Aortic
atherosclerosis. Calcification involving the RCA, LAD coronary
arteries noted.

Mediastinum/Nodes: No mediastinal or hilar adenopathy. The trachea
appears patent and is midline. Normal appearance of the esophagus.
No axillary or supraclavicular adenopathy.

Lungs/Pleura: No pleural effusion. Mild changes of centrilobular
emphysema. Scar like densities are noted within the lingula and both
lower lobes. Within the posterior right upper lobe there is a
subpleural nodule with an equivalent diameter of 4.7 mm, image 82 of
series 3.

Upper Abdomen: No acute abnormality.

Musculoskeletal: There is degenerative disc disease identified
within the thoracic spine.
IMPRESSION: 1. Lung-RADS Category 2, benign appearance or behavior. Continue
annual screening with low-dose chest CT without contrast in 12
months
2. Aortic atherosclerosis and coronary artery calcification
3. Emphysema.

## 2017-11-27 ENCOUNTER — Other Ambulatory Visit: Payer: Self-pay | Admitting: Family Medicine

## 2017-12-12 ENCOUNTER — Telehealth: Payer: Self-pay | Admitting: Family Medicine

## 2017-12-12 ENCOUNTER — Encounter: Payer: Self-pay | Admitting: Family Medicine

## 2017-12-12 DIAGNOSIS — N281 Cyst of kidney, acquired: Secondary | ICD-10-CM | POA: Insufficient documentation

## 2017-12-12 NOTE — Telephone Encounter (Signed)
Spoke with pt relaying message per Dr. Henriette Combs understanding.

## 2017-12-12 NOTE — Telephone Encounter (Signed)
plz notify I received CT scan report that did mention a spot on right kidney - recommend we further evaluate this with kidney ultrasound - ordered. We will be in touch to schedule this.

## 2017-12-12 NOTE — Telephone Encounter (Signed)
Renal US set up at [facility] and patient notifed

## 2017-12-28 ENCOUNTER — Ambulatory Visit
Admission: RE | Admit: 2017-12-28 | Discharge: 2017-12-28 | Disposition: A | Discharge status: Home / Self Care | Payer: Medicare Other | Source: Ambulatory Visit | Attending: Family Medicine | Admitting: Family Medicine

## 2017-12-28 DIAGNOSIS — N281 Cyst of kidney, acquired: Secondary | ICD-10-CM | POA: Insufficient documentation

## 2017-12-28 DIAGNOSIS — N2889 Other specified disorders of kidney and ureter: Secondary | ICD-10-CM | POA: Diagnosis present

## 2018-01-01 ENCOUNTER — Telehealth: Payer: Self-pay | Admitting: Family Medicine

## 2018-01-01 NOTE — Telephone Encounter (Signed)
Copied from [place] [id number]. Topic: Quick Communication - Other Results >> Jan 01, 2018  7:41 AM Brenton Grills, CMA wrote: Called patient to inform them of 12/28/17 imaging results. When patient returns call, triage nurse may disclose results. >> Jan 01, 2018  8:48 AM Boyd Kerbs wrote: Pt. Called for results  [facility] can give results

## 2018-01-01 NOTE — Telephone Encounter (Signed)
Patient notified of result. 

## 2018-01-15 LAB — HM DIABETES EYE EXAM

## 2018-03-12 ENCOUNTER — Encounter: Payer: Self-pay | Admitting: Family Medicine

## 2018-03-12 ENCOUNTER — Ambulatory Visit (INDEPENDENT_AMBULATORY_CARE_PROVIDER_SITE_OTHER): Payer: Medicare Other | Admitting: Family Medicine

## 2018-03-12 VITALS — BP 126/68 | HR 69 | Temp 97.8°F | Ht 65.0 in | Wt 232.5 lb

## 2018-03-12 DIAGNOSIS — J449 Chronic obstructive pulmonary disease, unspecified: Secondary | ICD-10-CM

## 2018-03-12 DIAGNOSIS — E118 Type 2 diabetes mellitus with unspecified complications: Secondary | ICD-10-CM

## 2018-03-12 DIAGNOSIS — Z720 Tobacco use: Secondary | ICD-10-CM

## 2018-03-12 DIAGNOSIS — I2510 Atherosclerotic heart disease of native coronary artery without angina pectoris: Secondary | ICD-10-CM

## 2018-03-12 DIAGNOSIS — R7303 Prediabetes: Secondary | ICD-10-CM | POA: Diagnosis not present

## 2018-03-12 DIAGNOSIS — I10 Essential (primary) hypertension: Secondary | ICD-10-CM

## 2018-03-12 LAB — POCT GLYCOSYLATED HEMOGLOBIN (HGB A1C): Hemoglobin A1C: 5.8 % — AB (ref 4.0–5.6)

## 2018-03-12 NOTE — Assessment & Plan Note (Signed)
Endorses recent spirometry at [facility] showing moderate lung disease with reversibility. I asked for a copy to update chart. Continue spiriva.

## 2018-03-12 NOTE — Patient Instructions (Signed)
Congratulations on weight loss and better sugar control! Continue current medicines. Bring me copy of labs and testing through [place].  Return in 6 months for physical.

## 2018-03-12 NOTE — Assessment & Plan Note (Signed)
Chronic, improved on current regimen. Continue.

## 2018-03-12 NOTE — Progress Notes (Signed)
BP 126/68 (BP Location: Left Arm, Patient Position: Sitting, Cuff Size: Large)   Pulse 69   Temp 97.8 F (36.6 C) (Oral)   Ht 5\' 5"  (1.651 m)   Wt 232 lb 8 oz (105.5 kg)   SpO2 94%   BMI 38.69 kg/m    CC: 6 mo f/u visit Subjective:    Patient ID: [name], male    DOB: [birth year], 67 y.o.   MRN: [id number]  HPI: [name] is a 67 y.o. male presenting on 03/12/2018 for 6 mo follow up   Started going to [place]. Sees PCP there.   Large sneeze last Wednesday - with residual L neck pain and L headache that is slowly improving. Positional, worse with certain neck movement.   2 R renal cysts on renal US 12/2017.   COPD in continued smoker - continued 1ppd. Has nicotrol inhaler. Recent spirometry at [facility] - moderate COPD with bronchodilator benefit.   HTN - last visit we started lisinopril /hctz combo pill. bp better controlled on this regimen. Denies HA, vision changes, CP/tightness, SOB, leg swelling. New bp cuff through [place]. Home readings 106-135/60-75  DM - does not check sugars. Compliant with antihyperglycemic regimen which includes: diet controlled (avoids bread, pasta, cakes). Denies low sugars or hypoglycemic symptoms. Denies paresthesias. Last diabetic eye exam 01/2018. Pneumovax: 2015. Prevnar: 2018. Glucometer brand: doesn't have. DSME: has not completed. Lab Results  Component Value Date   HGBA1C 5.8 (A) 03/12/2018   Diabetic Foot Exam - Simple   No data filed     Lab Results  Component Value Date   MICROALBUR <0.7 03/02/2016     Relevant past medical, surgical, family and social history reviewed and updated as indicated. Interim medical history since our last visit reviewed. Allergies and medications reviewed and updated. Outpatient Medications Prior to Visit  Medication Sig Dispense Refill  . albuterol (PROVENTIL HFA;VENTOLIN HFA) 108 (90 Base) MCG/ACT inhaler INHALE 2 PUFFS INTO THE LUNGS EVERY 6 HOURS AS NEEDED FOR WHEEZING ORSHORTNESS OF  BREATH 18 g 3  . Albuterol Sulfate (PROAIR RESPICLICK) 409 (90 Base) MCG/ACT AEPB Inhale 2 puffs into the lungs every 6 (six) hours as needed (cough, shortness of breath). 1 each 3  . aspirin EC 81 MG tablet Take 81 mg by mouth daily.    [name] atorvastatin (LIPITOR) 10 MG tablet TAKE 1 TABLET BY MOUTH ONCE DAILY 90 tablet 2  . budesonide-formoterol (SYMBICORT) 80-4.5 MCG/ACT inhaler Inhale 1 puff into the lungs 2 (two) times daily.    [name] lisinopril-hydrochlorothiazide (PRINZIDE,ZESTORETIC) 10-12.5 MG tablet Take 1 tablet by mouth daily. 90 tablet 3  . nicotine (NICOTROL) 10 MG inhaler Inhale 1 Cartridge (1 continuous puffing total) into the lungs as needed for smoking cessation. 42 each 1  . OVER THE COUNTER MEDICATION Take 1 capsule by mouth daily. Young [employer] B    . OVER THE COUNTER MEDICATION Take 1 capsule by mouth daily. Young Living Sulfurzyme (for healthy immune system and to boost liver function    . tiotropium (SPIRIVA) 18 MCG inhalation capsule Place 1 capsule (18 mcg total) into inhaler and inhale daily. 30 capsule 11   No facility-administered medications prior to visit.      Per HPI unless specifically indicated in ROS section below Review of Systems     Objective:    BP 126/68 (BP Location: Left Arm, Patient Position: Sitting, Cuff Size: Large)   Pulse 69   Temp 97.8 F (36.6 C) (Oral)  Ht 5\' 5"  (1.651 m)   Wt 232 lb 8 oz (105.5 kg)   SpO2 94%   BMI 38.69 kg/m   Wt Readings from Last 3 Encounters:  03/12/18 232 lb 8 oz (105.5 kg)  10/25/17 235 lb (106.6 kg)  09/11/17 238 lb (108 kg)    Physical Exam  Constitutional: He appears well-developed and well-nourished. No distress.  HENT:  Head: Normocephalic and atraumatic.  Right Ear: External ear normal.  Left Ear: External ear normal.  Nose: Nose normal.  Mouth/Throat: Oropharynx is clear and moist. No oropharyngeal exudate.  Eyes: Pupils are equal, round, and reactive to light. Conjunctivae and EOM are normal.  No scleral icterus.  Neck: Normal range of motion. Neck supple.  Cardiovascular: Normal rate, regular rhythm, normal heart sounds and intact distal pulses.  No murmur heard. Pulmonary/Chest: Effort normal and breath sounds normal. No respiratory distress. He has no wheezes. He has no rales.  Musculoskeletal: He exhibits no edema.  See HPI for foot exam if done  Lymphadenopathy:    He has no cervical adenopathy.  Skin: Skin is warm and dry. No rash noted.  Psychiatric: He has a normal mood and affect.  Nursing note and vitals reviewed.  Results for orders placed or performed in visit on 03/12/18  POCT glycosylated hemoglobin (Hb A1C)  Result Value Ref Range   Hemoglobin A1C 5.8 (A) 4.0 - 5.6 %   HbA1c POC (<> result, manual entry)     HbA1c, POC (prediabetic range)     HbA1c, POC (controlled diabetic range)    HM DIABETES EYE EXAM  Result Value Ref Range   HM Diabetic Eye Exam No Retinopathy No Retinopathy      Assessment & Plan:   Problem List Items Addressed This Visit    Tobacco abuse    Continue to encourage cessation. Discussed nicotrol inhaler use.       Severe obesity (BMI 35.0-39.9) with comorbidity ([place])    Congratulated on weight loss noted.       Prediabetes - Primary    Has reversed sugar levels down to prediabetes range through weight loss and healthy low carb diet changes. Congratulated! [facility]       Relevant Orders   POCT glycosylated hemoglobin (Hb A1C) (Completed)   Essential hypertension    Chronic, improved on current regimen. Continue.       COPD (chronic obstructive pulmonary disease) ([place])    Endorses recent spirometry at [facility] showing moderate lung disease with reversibility. I asked for a copy to update chart. Continue spiriva.           No orders of the defined types were placed in this encounter.  Orders Placed This Encounter  Procedures  . POCT glycosylated hemoglobin (Hb A1C)  . HM DIABETES EYE EXAM    This  external order was created through the Results Console.    Follow up plan: Return in about 6 months (around 09/12/2018) for medicare wellness visit.  Ria Bush, MD

## 2018-03-12 NOTE — Assessment & Plan Note (Addendum)
Has reversed sugar levels down to prediabetes range through weight loss and healthy low carb diet changes. Congratulated! [facility].

## 2018-03-12 NOTE — Assessment & Plan Note (Signed)
Congratulated on weight loss noted.  

## 2018-03-12 NOTE — Assessment & Plan Note (Signed)
Continue to encourage cessation. Discussed nicotrol inhaler use.

## 2018-06-27 ENCOUNTER — Other Ambulatory Visit: Payer: Self-pay | Admitting: Adult Health

## 2018-08-28 ENCOUNTER — Other Ambulatory Visit: Payer: Self-pay | Admitting: Family Medicine

## 2018-09-12 ENCOUNTER — Other Ambulatory Visit: Payer: Self-pay | Admitting: Family Medicine

## 2018-09-12 ENCOUNTER — Other Ambulatory Visit: Payer: Self-pay

## 2018-09-12 ENCOUNTER — Ambulatory Visit (INDEPENDENT_AMBULATORY_CARE_PROVIDER_SITE_OTHER): Payer: Medicare Other

## 2018-09-12 VITALS — BP 140/80 | HR 68 | Temp 98.4°F | Ht 65.75 in | Wt 227.4 lb

## 2018-09-12 DIAGNOSIS — E559 Vitamin D deficiency, unspecified: Secondary | ICD-10-CM

## 2018-09-12 DIAGNOSIS — E785 Hyperlipidemia, unspecified: Secondary | ICD-10-CM

## 2018-09-12 DIAGNOSIS — N400 Enlarged prostate without lower urinary tract symptoms: Secondary | ICD-10-CM

## 2018-09-12 DIAGNOSIS — Z0000 Encounter for general adult medical examination without abnormal findings: Secondary | ICD-10-CM

## 2018-09-12 DIAGNOSIS — Z23 Encounter for immunization: Secondary | ICD-10-CM | POA: Diagnosis not present

## 2018-09-12 DIAGNOSIS — R7303 Prediabetes: Secondary | ICD-10-CM | POA: Diagnosis not present

## 2018-09-12 DIAGNOSIS — I10 Essential (primary) hypertension: Secondary | ICD-10-CM

## 2018-09-12 LAB — LIPID PANEL
Cholesterol: 161 mg/dL (ref 0–200)
HDL: 84.6 mg/dL (ref >39.00)
LDL Cholesterol: 68 mg/dL (ref 0–99)
NonHDL: 76.14
Total CHOL/HDL Ratio: 2
Triglycerides: 41 mg/dL (ref 0.0–149.0)
VLDL: 8.2 mg/dL (ref 0.0–40.0)

## 2018-09-12 LAB — COMPREHENSIVE METABOLIC PANEL
ALT: 24 U/L (ref 0–53)
AST: 17 U/L (ref 0–37)
Albumin: 4.6 g/dL (ref 3.5–5.2)
Alkaline Phosphatase: 80 U/L (ref 39–117)
BUN: 13 mg/dL (ref 6–23)
CO2: 29 mEq/L (ref 19–32)
Calcium: 9.7 mg/dL (ref 8.4–10.5)
Chloride: 95 mEq/L — ABNORMAL LOW (ref 96–112)
Creatinine, Ser: 0.89 mg/dL (ref 0.40–1.50)
GFR: 85.21 mL/min (ref >60.00)
Glucose, Bld: 107 mg/dL — ABNORMAL HIGH (ref 70–99)
Potassium: 5.1 mEq/L (ref 3.5–5.1)
Sodium: 131 mEq/L — ABNORMAL LOW (ref 135–145)
Total Bilirubin: 1 mg/dL (ref 0.2–1.2)
Total Protein: 6.9 g/dL (ref 6.0–8.3)

## 2018-09-12 LAB — HEMOGLOBIN A1C: Hgb A1c MFr Bld: 6.3 % (ref 4.6–6.5)

## 2018-09-12 NOTE — Progress Notes (Signed)
PCP notes:   Health maintenance:  Foot exam - PCP follow-up requested Colon cancer screening - FOBT kit provided to patient PPSV23 - administered A1C - completed  Abnormal screenings:   Mini-Cog score: 19/20 MMSE - Mini Mental State Exam 09/12/2018  Orientation to time 5  Orientation to Place 5  Registration 3  Attention/ Calculation 0  Recall 3  Language- name 2 objects 0  Language- repeat 1  Language- follow 3 step command 2  Language- follow 3 step command-comments unable to follow 1 step of 3 step command  Language- read & follow direction 0  Write a sentence 0  Copy design 0  Total score 19    Patient concerns:   Medication refill request. Sent to PCP for approval.   Nurse concerns:  None  Next PCP appt:   09/17/18 @ 1030

## 2018-09-12 NOTE — Telephone Encounter (Signed)
Patient here today for AWV. Requested medication refill for Atorvastatin and Lisinopril-HCTZ. Pharmacy of choice is [facility] [phone].

## 2018-09-12 NOTE — Patient Instructions (Signed)
[name] , Thank you for taking time to come for your Medicare Wellness Visit. I appreciate your ongoing commitment to your health goals. Please review the following plan we discussed and let me know if I can assist you in the future.   These are the goals we discussed: Goals    . Patient Stated     Starting 09/12/18, I will continue to drink at least 48 oz of water daily.        This is a list of the screening recommended for you and due dates:  Health Maintenance  Topic Date Due  . Complete foot exam   09/17/2018*  . Stool Blood Test  09/21/2018  . Eye exam for diabetics  01/16/2019  . Hemoglobin A1C  03/13/2019  . DTaP/Tdap/Td vaccine (2 - Td) 08/24/2022  . Tetanus Vaccine  08/24/2022  . Flu Shot  Completed  .  Hepatitis C: One time screening is recommended by Center for Disease Control  (CDC) for  adults born from [birth year] through 1965.   Completed  . Pneumonia vaccines  Completed  . Colon Cancer Screening  Discontinued  *Topic was postponed. The date shown is not the original due date.   Preventive Care for Adults  A healthy lifestyle and preventive care can promote health and wellness. Preventive health guidelines for adults include the following key practices.  . A routine yearly physical is a good way to check with your health care provider about your health and preventive screening. It is a chance to share any concerns and updates on your health and to receive a thorough exam.  . Visit your dentist for a routine exam and preventive care every 6 months. Brush your teeth twice a day and floss once a day. Good oral hygiene prevents tooth decay and gum disease.  . The frequency of eye exams is based on your age, health, family medical history, use  of contact lenses, and other factors. Follow your health care provider's recommendations for frequency of eye exams.  . Eat a healthy diet. Foods like vegetables, fruits, whole grains, low-fat dairy products, and lean protein foods  contain the nutrients you need without too many calories. Decrease your intake of foods high in solid fats, added sugars, and salt. Eat the right amount of calories for you. Get information about a proper diet from your health care provider, if necessary.  . Regular physical exercise is one of the most important things you can do for your health. Most adults should get at least 150 minutes of moderate-intensity exercise (any activity that increases your heart rate and causes you to sweat) each week. In addition, most adults need muscle-strengthening exercises on 2 or more days a week.  Silver Sneakers may be a benefit available to you. To determine eligibility, you may visit the website: www.silversneakers.com or contact program at [phone] Mon-Fri between 8AM-8PM.   . Maintain a healthy weight. The body mass index (BMI) is a screening tool to identify possible weight problems. It provides an estimate of body fat based on height and weight. Your health care provider can find your BMI and can help you achieve or maintain a healthy weight.   For adults 20 years and older: ? A BMI below 18.5 is considered underweight. ? A BMI of 18.5 to 24.9 is normal. ? A BMI of 25 to 29.9 is considered overweight. ? A BMI of 30 and above is considered obese.   . Maintain normal blood lipids and cholesterol levels by  exercising and minimizing your intake of saturated fat. Eat a balanced diet with plenty of fruit and vegetables. Blood tests for lipids and cholesterol should begin at age 90 and be repeated every 5 years. If your lipid or cholesterol levels are high, you are over 50, or you are at high risk for heart disease, you may need your cholesterol levels checked more frequently. Ongoing high lipid and cholesterol levels should be treated with medicines if diet and exercise are not working.  . If you smoke, find out from your health care provider how to quit. If you do not use tobacco, please do not  start.  . If you choose to drink alcohol, please do not consume more than 2 drinks per day. One drink is considered to be 12 ounces (355 mL) of beer, 5 ounces (148 mL) of wine, or 1.5 ounces (44 mL) of liquor.  . If you are 38-36 years old, ask your health care provider if you should take aspirin to prevent strokes.  . Use sunscreen. Apply sunscreen liberally and repeatedly throughout the day. You should seek shade when your shadow is shorter than you. Protect yourself by wearing long sleeves, pants, a wide-brimmed hat, and sunglasses year round, whenever you are outdoors.  . Once a month, do a whole body skin exam, using a mirror to look at the skin on your back. Tell your health care provider of new moles, moles that have irregular borders, moles that are larger than a pencil eraser, or moles that have changed in shape or color.

## 2018-09-12 NOTE — Progress Notes (Signed)
Subjective:   [name] is a 68 y.o. male who presents for Medicare Annual/Subsequent preventive examination.  Review of Systems:  N/A Cardiac Risk Factors include: advanced age (>41men, >50 women);male gender;obesity (BMI >30kg/m2);dyslipidemia;hypertension;smoking/ tobacco exposure     Objective:    Vitals: BP 140/80 (BP Location: Right Arm, Patient Position: Sitting, Cuff Size: Large)   Pulse 68   Temp 98.4 F (36.9 C) (Oral)   Ht 5' 5.75" (1.67 m) Comment: shoes  Wt 227 lb 6.4 oz (103.1 kg)   SpO2 96%   BMI 36.98 kg/m   Body mass index is 36.98 kg/m.  Advanced Directives 09/12/2018 07/01/2015 06/27/2014 06/27/2014 06/19/2014 12/08/2013 03/28/2013  Does Patient Have a Medical Advance Directive? Yes Yes Yes - Yes Patient has advance directive, copy not in chart Patient has advance directive, copy not in chart  Type of Advance Directive [place];Living will [place];Living will [place];Living will [place];Living will [place];Living will Living will;Healthcare Power of Attorney Living will  Does patient want to make changes to medical advance directive? - No - Patient declined No - Patient declined - - No change requested -  Copy of [place] in Chart? No - copy requested No - copy requested No - copy requested - No - copy requested Copy requested from family -  Pre-existing out of facility DNR order (yellow form or pink MOST form) - - - - - No -    Tobacco Social History   Tobacco Use  Smoking Status Current Every Day Smoker  . Packs/day: 0.75  . Years: 40.00  . Pack years: 30.00  . Types: Cigarettes  Smokeless Tobacco Never Used  Tobacco Comment   anywhere from .25-1ppd      Ready to quit: No Counseling given: No Comment: anywhere from .25-1ppd    Clinical Intake:  Pre-visit preparation completed: Yes  Pain : No/denies pain Pain Score:  0-No pain     Nutritional Status: BMI > 30  Obese Nutritional Risks: None Diabetes: No  How often do you need to have someone help you when you read instructions, pamphlets, or other written materials from your doctor or pharmacy?: 1 - Never What is the last grade level you completed in school?: 12th grade + 2 yrs college  Interpreter Needed?: No  Comments: pt lives with spouse Information entered by :: LPinson, LPN  Past Medical History:  Diagnosis Date  . Allergic rhinitis 07/1998  . Aortic atherosclerosis ([place]) 09/2016   by CT  . Arthritis    BACK AND RIGHT KNEE  . CAD (coronary artery disease) 09/2016   RCA, LAD by CT  . COPD (chronic obstructive pulmonary disease) ([place]) 07/1998   centrilobular emphysema (09/2016) w ongoing tobacco use, spirometry (02/2013)  . Dysrhythmia    PVC'S  . ETOH abuse   . Hepatitis    pos test once not now  . HTN (hypertension) 05/1998  . Hyperlipidemia 06/1996  . Lumbar spinal stenosis 03/2013   s/p laminectomy  . 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 Shortness of breath dyspnea    WITH EXERTION  . Smoker    Past Surgical History:  Procedure Laterality Date  . BACK SURGERY  2007  . CARDIAC CATHETERIZATION N/A 07/01/2015   Procedure: Left Heart Cath and Coronary Angiography;  Surgeon:  Wellington Hampshire, MD;  Location: [facility];  Service: Cardiovascular;  Laterality: N/A;  . COLONOSCOPY  09/2010   polyps, diverticula, hemorrhoids.  Rpt 5 yrs  . ETT  2010   normal, ABIs normal  . KNEE ARTHROSCOPY Right 11   cartlage, ligaments  . KNEE ARTHROSCOPY Left 84  . LUMBAR LAMINECTOMY/DECOMPRESSION MICRODISCECTOMY Left 03/27/2013   Procedure: LUMBAR LAMINECTOMY/DECOMPRESSION MICRODISCECTOMY 2 LEVELS;  Surgeon: Ophelia Charter, MD;  Location: [facility];  Service: Neurosurgery;  Laterality: Left;  LEFT Lumbar Three Four diskectomy with  Lumbar Three-Four, Four-Five Laminectomy. L3/4 ahd L4/5 for LSS [name])   . Neck fusion  2003  . SHOULDER ARTHROSCOPY Right 11  . spriometry  02/2013   COPD with some restriction thought to be obesity related ([place])  . TOTAL KNEE ARTHROPLASTY Right 06/27/2014   Procedure: RIGHT TOTAL KNEE ARTHROPLASTY;  Surgeon: Mcarthur Rossetti, MD;  Location: [facility];  Service: Orthopedics;  Laterality: Right;  . US ECHOCARDIOGRAPHY  01/2013   WNL, mild diastolic dysfunction   Family History  Problem Relation Age of Onset  . Coronary artery disease Mother   . Hypertension Mother   . Stroke Mother   . Coronary artery disease Father   . Hypertension Father   . Diabetes Father   . Stroke Father   . Pneumonia Sister   . Irregular heart beat Brother   . Maple syrup urine disease Brother   . Cancer Neg Hx    Social History   Socioeconomic History  . Marital status: Married    Spouse name: Not on file  . Number of children: Not on file  . Years of education: Not on file  . Highest education level: Not on file  Occupational History  . Not on file  Social Needs  . Financial resource strain: Not on file  . Food insecurity:    Worry: Not on file    Inability: Not on file  . Transportation needs:    Medical: Not on file    Non-medical: Not on file  Tobacco Use  . Smoking status: Current Every Day Smoker    Packs/day: 0.75    Years: 40.00    Pack years: 30.00    Types: Cigarettes  . Smokeless tobacco: Never Used  . Tobacco comment: anywhere from .25-1ppd   Substance and Sexual Activity  . Alcohol use: Yes    Alcohol/week: 35.0 - 42.0 standard drinks    Types: 35 - 42 Cans of beer per week    Comment: 5-7 beers/days  . Drug use: No  . Sexual activity: Not Currently  Lifestyle  . Physical activity:    Days per week: Not on file    Minutes per session: Not on file  . Stress: Not on file  Relationships  . Social connections:    Talks on phone: Not on file    Gets together:  Not on file    Attends religious service: Not on file    Active member of club or organization: Not on file    Attends meetings of clubs or organizations: Not on file    Relationship status: Not on file  Other Topics Concern  . Not on file  Social History Narrative   Lives with wife. Has 1 son, 2 daughters.    Occupation: Psychologist, clinical.    Activity: no exercise   Diet: good water, fruits/vegetables daily      Screened positive for OSA at recent hospitalization (03/2013)  Outpatient Encounter Medications as of 09/12/2018  Medication Sig  . albuterol (PROVENTIL HFA;VENTOLIN HFA) 108 (90 Base) MCG/ACT inhaler INHALE 2 PUFFS INTO THE LUNGS EVERY 6 HOURS AS NEEDED FOR WHEEZING ORSHORTNESS OF BREATH  . Albuterol Sulfate (PROAIR RESPICLICK) 188 (90 Base) MCG/ACT AEPB Inhale 2 puffs into the lungs every 6 (six) hours as needed (cough, shortness of breath).  [name] aspirin EC 81 MG tablet Take 81 mg by mouth daily.  [name] atorvastatin (LIPITOR) 10 MG tablet TAKE 1 TABLET BY MOUTH ONCE DAILY  . budesonide-formoterol (SYMBICORT) 80-4.5 MCG/ACT inhaler Inhale 1 puff into the lungs 2 (two) times daily.  [name] lisinopril-hydrochlorothiazide (PRINZIDE,ZESTORETIC) 10-12.5 MG tablet Take 1 tablet by mouth daily.  . nicotine (NICOTROL) 10 MG inhaler Inhale 1 Cartridge (1 continuous puffing total) into the lungs as needed for smoking cessation.  [name] OVER THE COUNTER MEDICATION Take 1 capsule by mouth daily. [employer] B  . OVER THE COUNTER MEDICATION Take 1 capsule by mouth daily. [employer] Sulfurzyme (for healthy immune system and to boost liver function  . tiotropium (SPIRIVA) 18 MCG inhalation capsule Place 1 capsule (18 mcg total) into inhaler and inhale daily.   No facility-administered encounter medications on file as of 09/12/2018.     Activities of Daily Living In your present state of health, do you have any difficulty performing the following activities: 09/12/2018  Hearing? N  Vision? N    Difficulty concentrating or making decisions? N  Walking or climbing stairs? N  Dressing or bathing? N  Doing errands, shopping? N  Preparing Food and eating ? N  Using the Toilet? N  In the past six months, have you accidently leaked urine? N  Do you have problems with loss of bowel control? N  Managing your Medications? N  Managing your Finances? N  Housekeeping or managing your Housekeeping? N  Some recent data might be hidden    Patient Care Team: Ria Bush, MD as PCP - General (Family Medicine)   Assessment:   This is a routine wellness examination for [place].  Hearing Screening Comments: Hearing test in 2019 @ [facility] Screening Comments: Vision exam in July 2019 @ [facility]  Exercise Activities and Dietary recommendations Current Exercise Habits: The patient has a physically strenuous job, but has no regular exercise apart from work., Exercise limited by: None identified  Goals    . Patient Stated     Starting 09/12/18, I will continue to drink at least 48 oz of water daily.        Fall Risk Fall Risk  09/12/2018 09/11/2017 09/09/2016  Falls in the past year? 0 No No   Depression Screen PHQ 2/9 Scores 09/12/2018 09/11/2017 09/09/2016  PHQ - 2 Score 0 0 0  PHQ- 9 Score 0 - -    Cognitive Function MMSE - Mini Mental State Exam 09/12/2018  Orientation to time 5  Orientation to Place 5  Registration 3  Attention/ Calculation 0  Recall 3  Language- name 2 objects 0  Language- repeat 1  Language- follow 3 step command 2  Language- follow 3 step command-comments unable to follow 1 step of 3 step command  Language- read & follow direction 0  Write a sentence 0  Copy design 0  Total score 19       PLEASE NOTE: A Mini-Cog screen was completed. Maximum score is 20. A value of 0 denotes this part of [place] MMSE was not completed or the patient failed this part of the  Mini-Cog screening.   Mini-Cog Screening Orientation to Time - Max 5 pts Orientation to  Place - Max 5 pts Registration - Max 3 pts Recall - Max 3 pts Language Repeat - Max 1 pts Language Follow 3 Step Command - Max 3 pts   Immunization History  Administered Date(s) Administered  . Influenza Whole 05/18/2005  . Influenza, High Dose Seasonal PF 04/26/2018  . Influenza,inj,Quad PF,6+ Mos 04/16/2014, 04/14/2015, 04/15/2016, 05/05/2017  . Pneumococcal Conjugate-13 09/09/2016  . Pneumococcal Polysaccharide-23 08/26/2013, 09/12/2018  . Td 10/18/2002  . Tdap 08/24/2012  . Zoster 10/03/2013    Screening Tests Health Maintenance  Topic Date Due  . FOOT EXAM  09/17/2018 (Originally 09/11/2018)  . COLON CANCER SCREENING ANNUAL FOBT  09/21/2018  . OPHTHALMOLOGY EXAM  01/16/2019  . HEMOGLOBIN A1C  03/13/2019  . DTaP/Tdap/Td (2 - Td) 08/24/2022  . TETANUS/TDAP  08/24/2022  . INFLUENZA VACCINE  Completed  . Hepatitis C Screening  Completed  . PNA vac Low Risk Adult  Completed  . COLONOSCOPY  Discontinued       Plan:     I have personally reviewed, addressed, and noted the following in the patient's chart:  A. Medical and social history B. Use of alcohol, tobacco or illicit drugs  C. Current medications and supplements D. Functional ability and status E.  Nutritional status F.  Physical activity G. Advance directives H. List of other physicians I.  Hospitalizations, surgeries, and ER visits in previous 12 months J.  [place] to include hearing, vision, cognitive, depression L. Referrals and appointments - none  In addition, I have reviewed and discussed with patient certain preventive protocols, quality metrics, and best practice recommendations. A written personalized care plan for preventive services as well as general preventive health recommendations were provided to patient.  See attached scanned questionnaire for additional information.   Signed,   Lindell Noe, MHA, BS, LPN Health Coach

## 2018-09-13 LAB — PSA: PSA: 1.3 ng/mL (ref 0.10–4.00)

## 2018-09-13 LAB — VITAMIN D 25 HYDROXY (VIT D DEFICIENCY, FRACTURES): VITD: 26.87 ng/mL — ABNORMAL LOW (ref 30.00–100.00)

## 2018-09-14 MED ORDER — ATORVASTATIN CALCIUM 10 MG PO TABS: 10.0000 mg | ORAL_TABLET | Freq: Every day | ORAL | 3 refills | Status: DC

## 2018-09-14 MED ORDER — LISINOPRIL-HYDROCHLOROTHIAZIDE 10-12.5 MG PO TABS: 1.0000 | ORAL_TABLET | Freq: Every day | ORAL | 3 refills | Status: DC

## 2018-09-17 ENCOUNTER — Encounter: Payer: Self-pay | Admitting: Family Medicine

## 2018-09-17 ENCOUNTER — Ambulatory Visit (INDEPENDENT_AMBULATORY_CARE_PROVIDER_SITE_OTHER): Payer: Medicare Other | Admitting: Family Medicine

## 2018-09-17 VITALS — BP 140/82 | HR 66 | Temp 97.6°F | Ht 65.75 in | Wt 227.0 lb

## 2018-09-17 DIAGNOSIS — Z720 Tobacco use: Secondary | ICD-10-CM

## 2018-09-17 DIAGNOSIS — Z7189 Other specified counseling: Secondary | ICD-10-CM

## 2018-09-17 DIAGNOSIS — N401 Enlarged prostate with lower urinary tract symptoms: Secondary | ICD-10-CM | POA: Diagnosis not present

## 2018-09-17 DIAGNOSIS — I700 Atherosclerosis of aorta: Secondary | ICD-10-CM | POA: Diagnosis not present

## 2018-09-17 DIAGNOSIS — J301 Allergic rhinitis due to pollen: Secondary | ICD-10-CM

## 2018-09-17 DIAGNOSIS — Z7289 Other problems related to lifestyle: Secondary | ICD-10-CM | POA: Diagnosis not present

## 2018-09-17 DIAGNOSIS — J449 Chronic obstructive pulmonary disease, unspecified: Secondary | ICD-10-CM

## 2018-09-17 DIAGNOSIS — E559 Vitamin D deficiency, unspecified: Secondary | ICD-10-CM | POA: Diagnosis not present

## 2018-09-17 DIAGNOSIS — I10 Essential (primary) hypertension: Secondary | ICD-10-CM

## 2018-09-17 DIAGNOSIS — R7303 Prediabetes: Secondary | ICD-10-CM | POA: Diagnosis not present

## 2018-09-17 DIAGNOSIS — E785 Hyperlipidemia, unspecified: Secondary | ICD-10-CM

## 2018-09-17 DIAGNOSIS — R0683 Snoring: Secondary | ICD-10-CM | POA: Diagnosis not present

## 2018-09-17 DIAGNOSIS — R350 Frequency of micturition: Secondary | ICD-10-CM

## 2018-09-17 DIAGNOSIS — F1090 Alcohol use, unspecified, uncomplicated: Secondary | ICD-10-CM

## 2018-09-17 DIAGNOSIS — N281 Cyst of kidney, acquired: Secondary | ICD-10-CM

## 2018-09-17 MED ORDER — VITAMIN D3 25 MCG (1000 UT) PO CAPS: 1.0000 | ORAL_CAPSULE | Freq: Every day | ORAL | Status: DC

## 2018-09-17 NOTE — Assessment & Plan Note (Signed)
Congratulated on 5 lb weight loss noted. Pt motivated to continue healthy diet and lifestyle changes to affect sustainable weight loss.

## 2018-09-17 NOTE — Progress Notes (Signed)
BP 140/82 (BP Location: Left Arm, Patient Position: Sitting, Cuff Size: Normal)   Pulse 66   Temp 97.6 F (36.4 C) (Oral)   Ht 5' 5.75" (1.67 m)   Wt 227 lb (103 kg)   SpO2 97%   BMI 36.92 kg/m    CC: AMW f/u visit Subjective:    Patient ID: [name], male    DOB: 04/01/1951, 68 y.o.   MRN: [id number]  HPI: [name] is a 68 y.o. male presenting on 09/17/2018 for Annual Exam (Pt 2. )   Saw Katha Cabal last week for medicare wellness visit. Note reviewed.   Prediabetes - limits carbs, no added sweets.   Not interested in OSA evaluation although he has previously screened positive. S/p heart catheretization 2016 Sinus congestion worse in spring treats with OTC antihistamine. This year noted symptoms in the winter months - he has started restoring antiques.  Preventative: Colonoscopy 09/23/2010 - polyps, diverticula, hemorrhoids. Rpt 5-10 yrs [place]) - pathology requested today.iFOB normal 2019. Stool kit this year pending.  Prostate - normal DRE/PSA in past. Nocturia x3-4. Strong stream. Q62yrcheck today.normal 2018. Declines DRE.  Lung cancer screening - discussed - interested. Will refer to lung cancer screening nurse. AAA screen - poor study, no AAA noted. Flu shot - 03/2015 Pneumovax -2015, 2020, prevnar 2018 Tdap 2014 zostavax - 09/2013 shingrix - discussed Advanced directives: has at home. HCPOA is daughter, CDonella Stade Will bring [place].  Seat belt use discussed. Sunscreen use discussed. No changing moles on skin.  Smoking 1/2 ppd. sparingly uses nicotrol inhaler Alcohol - 6 beers/day  Dentist q6 mo Eye exam yearly   Lives with wife. Has 1 son, 2 daughters.  Occupation: MPsychologist, clinical  Activity: no regular exercise.Treadmill - not regularly using.  Diet: no water, fruits/vegetables daily     Relevant past medical, surgical, family and social history reviewed and updated as indicated. Interim medical history since our last visit  reviewed. Allergies and medications reviewed and updated. Outpatient Medications Prior to Visit  Medication Sig Dispense Refill  . albuterol (PROVENTIL HFA;VENTOLIN HFA) 108 (90 Base) MCG/ACT inhaler INHALE 2 PUFFS INTO THE LUNGS EVERY 6 HOURS AS NEEDED FOR WHEEZING ORSHORTNESS OF BREATH 18 g 3  . Albuterol Sulfate (PROAIR RESPICLICK) 1941(90 Base) MCG/ACT AEPB Inhale 2 puffs into the lungs every 6 (six) hours as needed (cough, shortness of breath). 1 each 3  . aspirin EC 81 MG tablet Take 81 mg by mouth daily.    .[name] Kitchenatorvastatin (LIPITOR) 10 MG tablet Take 1 tablet (10 mg total) by mouth daily. 90 tablet 3  . budesonide-formoterol (SYMBICORT) 80-4.5 MCG/ACT inhaler Inhale 1 puff into the lungs 2 (two) times daily.    .[name] Kitchenlisinopril-hydrochlorothiazide (PRINZIDE,ZESTORETIC) 10-12.5 MG tablet Take 1 tablet by mouth daily. 90 tablet 3  . nicotine (NICOTROL) 10 MG inhaler Inhale 1 Cartridge (1 continuous puffing total) into the lungs as needed for smoking cessation. 42 each 1  . OVER THE COUNTER MEDICATION Take 1 capsule by mouth daily. Young [employer]    . OVER THE COUNTER MEDICATION Take 1 capsule by mouth daily. Young Living Sulfurzyme (for healthy immune system and to boost liver function    . tiotropium (SPIRIVA) 18 MCG inhalation capsule Place 1 capsule (18 mcg total) into inhaler and inhale daily. 30 capsule 11   No facility-administered medications prior to visit.      Per HPI unless specifically indicated in ROS section below Review of Systems Objective:  BP 140/82 (BP Location: Left Arm, Patient Position: Sitting, Cuff Size: Normal)   Pulse 66   Temp 97.6 F (36.4 C) (Oral)   Ht 5' 5.75" (1.67 m)   Wt 227 lb (103 kg)   SpO2 97%   BMI 36.92 kg/m   Wt Readings from Last 3 Encounters:  09/17/18 227 lb (103 kg)  09/12/18 227 lb 6.4 oz (103.1 kg)  03/12/18 232 lb 8 oz (105.5 kg)    Physical Exam Vitals signs and nursing note reviewed.  Constitutional:      General: He is  not in acute distress.    Appearance: He is well-developed.  HENT:     Head: Normocephalic and atraumatic.     Right Ear: Hearing, tympanic membrane, ear canal and external ear normal.     Left Ear: Hearing, tympanic membrane, ear canal and external ear normal.     Nose: Nose normal.     Mouth/Throat:     Pharynx: Uvula midline. No oropharyngeal exudate or posterior oropharyngeal erythema.  Eyes:     General: No scleral icterus.    Conjunctiva/sclera: Conjunctivae normal.     Pupils: Pupils are equal, round, and reactive to light.  Neck:     Musculoskeletal: Normal range of motion and neck supple.     Vascular: No carotid bruit.  Cardiovascular:     Rate and Rhythm: Normal rate and regular rhythm.     Pulses: Normal pulses.          Radial pulses are 2+ on the right side and 2+ on the left side.     Heart sounds: Normal heart sounds. No murmur.  Pulmonary:     Effort: Pulmonary effort is normal. No respiratory distress.     Breath sounds: Normal breath sounds. No wheezing, rhonchi or rales.  Abdominal:     General: Bowel sounds are normal. There is no distension.     Palpations: Abdomen is soft. There is no mass.     Tenderness: There is no abdominal tenderness. There is no guarding or rebound.  Musculoskeletal: Normal range of motion.  Lymphadenopathy:     Cervical: No cervical adenopathy.  Skin:    General: Skin is warm and dry.     Findings: No rash.  Neurological:     Mental Status: He is alert and oriented to person, place, and time.     Comments: CN grossly intact, station and gait intact  Psychiatric:        Behavior: Behavior normal.        Thought Content: Thought content normal.        Judgment: Judgment normal.       Results for orders placed or performed in visit on 09/12/18  VITAMIN D 25 Hydroxy (Vit-D Deficiency, Fractures)  Result Value Ref Range   VITD 26.87 (L) 30.00 - 100.00 ng/mL  PSA  Result Value Ref Range   PSA 1.30 0.10 - 4.00 ng/mL   Hemoglobin A1c  Result Value Ref Range   Hgb A1c MFr Bld 6.3 4.6 - 6.5 %  Comprehensive metabolic panel  Result Value Ref Range   Sodium 131 (L) 135 - 145 mEq/L   Potassium 5.1 3.5 - 5.1 mEq/L   Chloride 95 (L) 96 - 112 mEq/L   CO2 29 19 - 32 mEq/L   Glucose, Bld 107 (H) 70 - 99 mg/dL   BUN 13 6 - 23 mg/dL   Creatinine, Ser 0.89 0.40 - 1.50 mg/dL   Total Bilirubin 1.0 0.2 -  1.2 mg/dL   Alkaline Phosphatase 80 39 - 117 U/L   AST 17 0 - 37 U/L   ALT 24 0 - 53 U/L   Total Protein 6.9 6.0 - 8.3 g/dL   Albumin 4.6 3.5 - 5.2 g/dL   Calcium 9.7 8.4 - 10.5 mg/dL   GFR 85.21 >60.00 mL/min  Lipid panel  Result Value Ref Range   Cholesterol 161 0 - 200 mg/dL   Triglycerides 41.0 0.0 - 149.0 mg/dL   HDL 84.60 >39.00 mg/dL   VLDL 8.2 0.0 - 40.0 mg/dL   LDL Cholesterol 68 0 - 99 mg/dL   Total CHOL/HDL Ratio 2    NonHDL 76.14    Assessment & Plan:   Problem List Items Addressed This Visit    Vitamin D deficiency    rec start vit D 1000 IU daily.       Tobacco abuse    Continue to encourage cessation. Reviewed benefits of quitting smoking. Precontemplative.       Snoring    Not interested in OSA evaluation although he has previously screened positive.      Severe obesity (BMI 35.0-39.9) with comorbidity ([place])    Congratulated on 5 lb weight loss noted. Pt motivated to continue healthy diet and lifestyle changes to affect sustainable weight loss.      Prediabetes    Reviewed readings with patient. Remains in prediabetes range. Motivated for healthy diet to maintain tight glycemic control.       Kidney cyst, acquired    Recent renal US stable.       HLD (hyperlipidemia) - Primary    Chronic, stable on lipitor. Continue this.  The 10-year ASCVD risk score Mikey Bussing DC Brooke Bonito., et al., 2013) is: 30.8%   Values used to calculate the score:     Age: 83 years     Sex: Male     Is Non-Hispanic African American: No     Diabetic: Yes     Tobacco smoker: Yes     Systolic Blood  Pressure: 140 mmHg     Is BP treated: Yes     HDL Cholesterol: 84.6 mg/dL     Total Cholesterol: 161 mg/dL       Habitual alcohol use    Encouraged continued titration, reviewed risks of habitual alcohol use including effect on sugar levels and liver. Not interested at this time.       Essential hypertension    Chronic, stable. Continue current regimen.       COPD (chronic obstructive pulmonary disease) (HCC)    Chronic, stable. Continue spiriva      BPH (benign prostatic hyperplasia)    PSA stable. Declines DRE      Aortic atherosclerosis (HCC)    Continue aspirin, statin.       Allergic rhinitis    Discussed allergen avoidance measures, as well as OTC antihistamine and nasal saline use.       Advanced care planning/counseling discussion    Advanced directives: has at home. HCPOA is daughter, [name]. Will bring [place] copy.           Meds ordered this encounter  Medications  . Cholecalciferol (VITAMIN D3) 25 MCG (1000 UT) CAPS    Sig: Take 1 capsule (1,000 Units total) by mouth daily.    Dispense:  30 capsule   No orders of the defined types were placed in this encounter.   Follow up plan: Return in about 9 months (around 06/19/2019) for follow up visit.  Ria Bush, MD

## 2018-09-17 NOTE — Assessment & Plan Note (Signed)
Not interested in OSA evaluation although he has previously screened positive.

## 2018-09-17 NOTE — Assessment & Plan Note (Signed)
rec start vit D 1000 IU daily.  

## 2018-09-17 NOTE — Assessment & Plan Note (Addendum)
Chronic, stable on lipitor. Continue this.  The 10-year ASCVD risk score Mikey Bussing DC Brooke Bonito., et al., 2013) is: 30.8%   Values used to calculate the score:     Age: 68 years     Sex: Male     Is Non-Hispanic African American: No     Diabetic: Yes     Tobacco smoker: Yes     Systolic Blood Pressure: 074 mmHg     Is BP treated: Yes     HDL Cholesterol: 84.6 mg/dL     Total Cholesterol: 161 mg/dL

## 2018-09-17 NOTE — Assessment & Plan Note (Signed)
Reviewed readings with patient. Remains in prediabetes range. Motivated for healthy diet to maintain tight glycemic control.

## 2018-09-17 NOTE — Assessment & Plan Note (Signed)
Advanced directives: has at home. HCPOA is daughter, [name]. Will bring [place] copy.

## 2018-09-17 NOTE — Assessment & Plan Note (Signed)
Encouraged continued titration, reviewed risks of habitual alcohol use including effect on sugar levels and liver. Not interested at this time.

## 2018-09-17 NOTE — Assessment & Plan Note (Signed)
Chronic, stable. Continue spiriva

## 2018-09-17 NOTE — Assessment & Plan Note (Signed)
Continue to encourage cessation. Reviewed benefits of quitting smoking. Precontemplative.

## 2018-09-17 NOTE — Assessment & Plan Note (Signed)
PSA stable. Declines DRE

## 2018-09-17 NOTE — Patient Instructions (Addendum)
Sign release for colonoscopy biopsy report.  Bring [place] copy of your living will.  [facility] ([facility]). If they don't have it, then check with local pharmacy.  Work on alcohol and cigarette use.  Return in 9 months for follow up visit.   Health Maintenance After Age 68 After age 64, you are at a higher risk for certain long-term diseases and infections as well as injuries from falls. Falls are a major cause of broken bones and head injuries in people who are older than age 40. Getting regular preventive care can help to keep you healthy and well. Preventive care includes getting regular testing and making lifestyle changes as recommended by your health care provider. Talk with your health care provider about:  Which screenings and tests you should have. A screening is a test that checks for a disease when you have no symptoms.  A diet and exercise plan that is right for you. What should I know about screenings and tests to prevent falls? Screening and testing are the best ways to find a health problem early. Early diagnosis and treatment give you the best chance of managing medical conditions that are common after age 33. Certain conditions and lifestyle choices may make you more likely to have a fall. Your health care provider may recommend:  Regular vision checks. Poor vision and conditions such as cataracts can make you more likely to have a fall. If you wear glasses, make sure to get your prescription updated if your vision changes.  Medicine review. Work with your health care provider to regularly review all of the medicines you are taking, including over-the-counter medicines. Ask your health care provider about any side effects that may make you more likely to have a fall. Tell your health care provider if any medicines that you take make you feel dizzy or sleepy.  Osteoporosis screening. Osteoporosis is a condition that causes the bones to get weaker. This can make the  bones weak and cause them to break more easily.  Blood pressure screening. Blood pressure changes and medicines to control blood pressure can make you feel dizzy.  Strength and balance checks. Your health care provider may recommend certain tests to check your strength and balance while standing, walking, or changing positions.  Foot health exam. Foot pain and numbness, as well as not wearing proper footwear, can make you more likely to have a fall.  Depression screening. You may be more likely to have a fall if you have a fear of falling, feel emotionally low, or feel unable to do activities that you used to do.  Alcohol use screening. Using too much alcohol can affect your balance and may make you more likely to have a fall. What actions can I take to lower my risk of falls? General instructions  Talk with your health care provider about your risks for falling. Tell your health care provider if: ? You fall. Be sure to tell your health care provider about all falls, even ones that seem minor. ? You feel dizzy, sleepy, or off-balance.  Take over-the-counter and prescription medicines only as told by your health care provider. These include any supplements.  Eat a healthy diet and maintain a healthy weight. A healthy diet includes low-fat dairy products, low-fat (lean) meats, and fiber from whole grains, beans, and lots of fruits and vegetables. Home safety  Remove any tripping hazards, such as rugs, cords, and clutter.  Install safety equipment such as grab bars in  bathrooms and safety rails on stairs.  Keep rooms and walkways well-lit. Activity   Follow a regular exercise program to stay fit. This will help you maintain your balance. Ask your health care provider what types of exercise are appropriate for you.  If you need a cane or walker, use it as recommended by your health care provider.  Wear supportive shoes that have nonskid soles. Lifestyle  Do not drink alcohol if your  health care provider tells you not to drink.  If you drink alcohol, limit how much you have: ? 0-1 drink a day for women. ? 0-2 drinks a day for men.  Be aware of how much alcohol is in your drink. In the [place] one drink equals one typical bottle of beer (12 oz), one-half glass of wine (5 oz), or one shot of hard liquor (1 oz).  Do not use any products that contain nicotine or tobacco, such as cigarettes and e-cigarettes. If you need help quitting, ask your health care provider. Summary  Having a healthy lifestyle and getting preventive care can help to protect your health and wellness after age 65.  Screening and testing are the best way to find a health problem early and help you avoid having a fall. Early diagnosis and treatment give you the best chance for managing medical conditions that are more common for people who are older than age 68.  Falls are a major cause of broken bones and head injuries in people who are older than age 63. Take precautions to prevent a fall at home.  Work with your health care provider to learn what changes you can make to improve your health and wellness and to prevent falls. This information is not intended to replace advice given to you by your health care provider. Make sure you discuss any questions you have with your health care provider. Document Released: 05/17/2017 Document Revised: 05/17/2017 Document Reviewed: 05/17/2017 Elsevier Interactive Patient Education  2019 [employer].

## 2018-09-17 NOTE — Assessment & Plan Note (Signed)
Chronic, stable. Continue current regimen. 

## 2018-09-17 NOTE — Assessment & Plan Note (Signed)
Discussed allergen avoidance measures, as well as OTC antihistamine and nasal saline use.

## 2018-09-17 NOTE — Assessment & Plan Note (Signed)
Recent renal US stable.

## 2018-09-17 NOTE — Assessment & Plan Note (Signed)
Continue aspirin, statin.  

## 2018-09-20 ENCOUNTER — Other Ambulatory Visit (INDEPENDENT_AMBULATORY_CARE_PROVIDER_SITE_OTHER): Payer: Medicare Other

## 2018-09-20 ENCOUNTER — Other Ambulatory Visit: Payer: Self-pay | Admitting: Family Medicine

## 2018-09-20 ENCOUNTER — Encounter: Payer: Self-pay | Admitting: Family Medicine

## 2018-09-20 DIAGNOSIS — Z1211 Encounter for screening for malignant neoplasm of colon: Secondary | ICD-10-CM

## 2018-09-20 LAB — FECAL OCCULT BLOOD, GUAIAC: Fecal Occult Blood: NEGATIVE

## 2018-09-20 LAB — FECAL OCCULT BLOOD, IMMUNOCHEMICAL: Fecal Occult Bld: NEGATIVE

## 2018-10-04 ENCOUNTER — Encounter: Payer: Self-pay | Admitting: *Deleted

## 2018-10-07 ENCOUNTER — Encounter: Payer: Self-pay | Admitting: *Deleted

## 2018-10-24 ENCOUNTER — Encounter: Payer: Self-pay | Admitting: Family Medicine

## 2018-10-28 NOTE — Progress Notes (Signed)
I reviewed health advisor's note, was available for consultation, and agree with documentation and plan.  

## 2018-12-19 ENCOUNTER — Telehealth: Payer: Self-pay | Admitting: *Deleted

## 2018-12-19 NOTE — Telephone Encounter (Signed)
Contacted regarding scheduling lung screening scan and patient would like to consider next month.

## 2019-01-21 ENCOUNTER — Telehealth: Payer: Self-pay | Admitting: *Deleted

## 2019-01-21 NOTE — Telephone Encounter (Signed)
Left message for patient to notify them that it is time to schedule annual low dose lung cancer screening CT scan. Instructed patient to call back to verify information prior to the scan being scheduled.  

## 2019-01-24 ENCOUNTER — Telehealth: Payer: Self-pay | Admitting: *Deleted

## 2019-01-24 NOTE — Telephone Encounter (Signed)
Left message for patient to notify them that it is time to schedule annual low dose lung cancer screening CT scan. Instructed patient to call back to verify information prior to the scan being scheduled.  

## 2019-01-28 ENCOUNTER — Telehealth: Payer: Self-pay | Admitting: *Deleted

## 2019-01-28 DIAGNOSIS — Z122 Encounter for screening for malignant neoplasm of respiratory organs: Secondary | ICD-10-CM

## 2019-01-28 DIAGNOSIS — Z87891 Personal history of nicotine dependence: Secondary | ICD-10-CM

## 2019-01-28 NOTE — Telephone Encounter (Signed)
Patient has been notified that annual lung cancer screening low dose CT scan is due currently or will be in near future. Confirmed that patient is within the age range of 55-77, and asymptomatic, (no signs or symptoms of lung cancer). Patient denies illness that would prevent curative treatment for lung cancer if found. Verified smoking history, (current, 31.75 pack year). The shared decision making visit was done 09/27/16. Patient is agreeable for CT scan being scheduled.

## 2019-01-31 ENCOUNTER — Ambulatory Visit
Admission: RE | Admit: 2019-01-31 | Discharge: 2019-01-31 | Disposition: A | Discharge status: Home / Self Care | Payer: Medicare Other | Source: Ambulatory Visit | Attending: Nurse Practitioner | Admitting: Nurse Practitioner

## 2019-01-31 ENCOUNTER — Other Ambulatory Visit: Payer: Self-pay

## 2019-01-31 DIAGNOSIS — Z122 Encounter for screening for malignant neoplasm of respiratory organs: Secondary | ICD-10-CM | POA: Insufficient documentation

## 2019-01-31 DIAGNOSIS — Z87891 Personal history of nicotine dependence: Secondary | ICD-10-CM | POA: Diagnosis not present

## 2019-02-05 ENCOUNTER — Encounter: Payer: Self-pay | Admitting: *Deleted

## 2019-04-23 ENCOUNTER — Ambulatory Visit (INDEPENDENT_AMBULATORY_CARE_PROVIDER_SITE_OTHER): Payer: Medicare Other

## 2019-04-23 DIAGNOSIS — Z23 Encounter for immunization: Secondary | ICD-10-CM | POA: Diagnosis not present

## 2019-04-24 ENCOUNTER — Ambulatory Visit (INDEPENDENT_AMBULATORY_CARE_PROVIDER_SITE_OTHER): Payer: Medicare Other | Admitting: Family Medicine

## 2019-04-24 ENCOUNTER — Ambulatory Visit (INDEPENDENT_AMBULATORY_CARE_PROVIDER_SITE_OTHER)
Admission: RE | Admit: 2019-04-24 | Discharge: 2019-04-24 | Disposition: A | Discharge status: Home / Self Care | Payer: Medicare Other | Source: Ambulatory Visit | Attending: Family Medicine | Admitting: Family Medicine

## 2019-04-24 ENCOUNTER — Other Ambulatory Visit: Payer: Self-pay

## 2019-04-24 ENCOUNTER — Encounter: Payer: Self-pay | Admitting: Family Medicine

## 2019-04-24 VITALS — BP 158/68 | HR 74 | Temp 97.9°F | Ht 66.0 in | Wt 211.3 lb

## 2019-04-24 DIAGNOSIS — S76011A Strain of muscle, fascia and tendon of right hip, initial encounter: Secondary | ICD-10-CM | POA: Diagnosis not present

## 2019-04-24 DIAGNOSIS — M5416 Radiculopathy, lumbar region: Secondary | ICD-10-CM | POA: Insufficient documentation

## 2019-04-24 DIAGNOSIS — M25551 Pain in right hip: Secondary | ICD-10-CM | POA: Diagnosis not present

## 2019-04-24 DIAGNOSIS — M79651 Pain in right thigh: Secondary | ICD-10-CM | POA: Insufficient documentation

## 2019-04-24 MED ORDER — TIZANIDINE HCL 4 MG PO TABS: 4.0000 mg | ORAL_TABLET | Freq: Two times a day (BID) | ORAL | 0 refills | Status: DC | PRN

## 2019-04-24 NOTE — Progress Notes (Signed)
This visit was conducted in person.  BP (!) 158/68 (BP Location: Right Arm, Patient Position: Sitting, Cuff Size: Large)   Pulse 74   Temp 97.9 F (36.6 C) (Temporal)   Ht 5\' 6"  (1.676 m)   Wt 211 lb 5 oz (95.9 kg)   SpO2 97%   BMI 34.11 kg/m   BP Readings from Last 3 Encounters:  04/24/19 (!) 158/68  09/17/18 140/82  09/12/18 140/80    CC: R leg pain Subjective:    Patient ID: [name], male    DOB: 1950/07/23, 68 y.o.   MRN: [id number]  HPI: [name] is a 68 y.o. male presenting on 04/24/2019 for Leg Pain (C/o constant pain in right anterior thigh, worsening. Started about 2 mos ago. Says muscle seems to be firmer than left thigh. )   2 mo h/o R anterior thigh pain without inciting trauma/injury or falls. R anterior thigh muscle feels firm and tight. Worsening over the last 2 weeks. "Feels like knife" worst 8/10. Pain localized to proximal anterior thigh. Currently 4/10. Affecting ability to go up stairs, ability to keep balance.   No leg swelling, redness, warmth of leg. No shooting pain down leg, no numbness, paresthesias. No abd pain, groin pain.   Tried heating pad without benefit Takes tylenol daily for discomfort with mild benefit.   H/o R TKR 2015  Ongoing weight loss since 2018 - has been trying - diet changes, limiting carbs, limiting portions. Peak weight 250 08/2016.   Sodium levels recently low at [facility] - so hctz was stopped. He is currently only on lisinopril 10mg  daily for HTN. Declines additional med today - wants to reassess in 1 month.      Relevant past medical, surgical, family and social history reviewed and updated as indicated. Interim medical history since our last visit reviewed. Allergies and medications reviewed and updated. Outpatient Medications Prior to Visit  Medication Sig Dispense Refill  . albuterol (PROVENTIL HFA;VENTOLIN HFA) 108 (90 Base) MCG/ACT inhaler INHALE 2 PUFFS INTO THE LUNGS EVERY 6 HOURS AS NEEDED FOR WHEEZING  ORSHORTNESS OF BREATH 18 g 3  . Albuterol Sulfate (PROAIR RESPICLICK) 123XX123 (90 Base) MCG/ACT AEPB Inhale 2 puffs into the lungs every 6 (six) hours as needed (cough, shortness of breath). 1 each 3  . aspirin EC 81 MG tablet Take 81 mg by mouth daily.    [name] atorvastatin (LIPITOR) 10 MG tablet Take 1 tablet (10 mg total) by mouth daily. 90 tablet 3  . budesonide-formoterol (SYMBICORT) 80-4.5 MCG/ACT inhaler Inhale 1 puff into the lungs 2 (two) times daily.    . Cholecalciferol (VITAMIN D3) 25 MCG (1000 UT) CAPS Take 1 capsule (1,000 Units total) by mouth daily. 30 capsule   . nicotine (NICOTROL) 10 MG inhaler Inhale 1 Cartridge (1 continuous puffing total) into the lungs as needed for smoking cessation. 42 each 1  . OVER THE COUNTER MEDICATION Take 1 capsule by mouth daily. Young [employer]    . OVER THE COUNTER MEDICATION Take 1 capsule by mouth daily. [employer] Sulfur[employer] (for healthy immune system and to boost liver function    . tiotropium (SPIRIVA) 18 MCG inhalation capsule Place 1 capsule (18 mcg total) into inhaler and inhale daily. 30 capsule 11  . lisinopril-hydrochlorothiazide (PRINZIDE,ZESTORETIC) 10-12.5 MG tablet Take 1 tablet by mouth daily. 90 tablet 3  . lisinopril (ZESTRIL) 10 MG tablet Take 1 tablet (10 mg total) by mouth daily.     No facility-administered medications  prior to visit.      Per HPI unless specifically indicated in ROS section below Review of Systems Objective:    BP (!) 158/68 (BP Location: Right Arm, Patient Position: Sitting, Cuff Size: Large)   Pulse 74   Temp 97.9 F (36.6 C) (Temporal)   Ht 5\' 6"  (1.676 m)   Wt 211 lb 5 oz (95.9 kg)   SpO2 97%   BMI 34.11 kg/m   Wt Readings from Last 3 Encounters:  04/24/19 211 lb 5 oz (95.9 kg)  01/31/19 216 lb (98 kg)  09/17/18 227 lb (103 kg)    Physical Exam Vitals signs and nursing note reviewed.  Constitutional:      General: He is not in acute distress.    Appearance: Normal appearance. He is  obese. He is not ill-appearing.  Musculoskeletal: Normal range of motion.        General: Tenderness present.     Right lower leg: No edema.     Left lower leg: No edema.     Comments:  Neg SLR bilaterally. L leg WNL  R leg: No pain with int/ext rotation at hip. Neg FABER. No mass appreciated to R anterior hip muscle mass Marked reproducible tenderness with testing hip flexors against resistance  Tender to palpation proximal anterior hip along rectus femoris  Skin:    General: Skin is warm and dry.     Findings: No erythema or rash.  Neurological:     General: No focal deficit present.     Mental Status: He is alert.  Psychiatric:        Mood and Affect: Mood normal.       Results for orders placed or performed in visit on 09/20/18  Fecal Occult Blood, Guaiac  Result Value Ref Range   Fecal Occult Blood Negative    Assessment & Plan:   Problem List Items Addressed This Visit    Hip strain, right, initial encounter - Primary    Exam most consistent with R hip flexor strain. No mass appreciated. Check hip xray to eval for baseline arthritis burden. Rx aleve, muscle relaxant, heating pad, exercises provided today. Update if not improving with treatment.  If ongoing, check CPK in statin use.       Relevant Orders   DG Hip Unilat W OR W/O Pelvis 2-3 Views Right       Meds ordered this encounter  Medications  . tiZANidine (ZANAFLEX) 4 MG tablet    Sig: Take 1 tablet (4 mg total) by mouth 2 (two) times daily as needed for muscle spasms (sedation precautions).    Dispense:  30 tablet    Refill:  0   Orders Placed This Encounter  Procedures  . DG Hip Unilat W OR W/O Pelvis 2-3 Views Right    Standing Status:   Future    Number of Occurrences:   1    Standing Expiration Date:   06/23/2020    Order Specific Question:   Reason for Exam (SYMPTOM  OR DIAGNOSIS REQUIRED)    Answer:   R anterior hip pain    Order Specific Question:   Preferred imaging location?    Answer:    [facility]    Order Specific Question:   Radiology Contrast Protocol - do NOT remove file path    Answer:   [email or web address]    Patient Instructions  I think you have a hip flexor strain.  Continue heating pad, add muscle relaxant sent to pharmacy. May do  low dose anti inflammatory as well - aleve twice daily with meals for the next 5 days.  Do exercises provided today.  Hip xray today.    Follow up plan: No follow-ups on file.  Ria Bush, MD

## 2019-04-24 NOTE — Patient Instructions (Signed)
I think you have a hip flexor strain.  Continue heating pad, add muscle relaxant sent to pharmacy. May do low dose anti inflammatory as well - aleve twice daily with meals for the next 5 days.  Do exercises provided today.  Hip xray today.

## 2019-04-24 NOTE — Assessment & Plan Note (Addendum)
Exam most consistent with R hip flexor strain. No mass appreciated. Check hip xray to eval for baseline arthritis burden. Rx aleve, muscle relaxant, heating pad, exercises provided today. Update if not improving with treatment.  If ongoing, check CPK in statin use.

## 2019-05-06 ENCOUNTER — Other Ambulatory Visit: Payer: Self-pay | Admitting: Family Medicine

## 2019-05-06 NOTE — Telephone Encounter (Signed)
Last filled 04/24/2019 #30... to take BID... please advise

## 2019-05-07 NOTE — Telephone Encounter (Signed)
Refilled.  plz call pt - how is thigh pain/tightness doing?

## 2019-05-08 NOTE — Telephone Encounter (Signed)
Spoke with pt notifying him Zanaflex refill was sent in.  Pt states he muscle relaxer helps until it wears off, then pain returns.  He's concerned about becoming dependent on med so he does not want anymore refills.

## 2019-06-06 ENCOUNTER — Telehealth: Payer: Self-pay

## 2019-06-06 NOTE — Telephone Encounter (Signed)
LVM w COVID screen, front door and back lab info 11.19.2020 TLJ

## 2019-06-12 NOTE — Telephone Encounter (Signed)
Opened in error

## 2019-06-15 ENCOUNTER — Other Ambulatory Visit: Payer: Self-pay | Admitting: Family Medicine

## 2019-06-15 DIAGNOSIS — I739 Peripheral vascular disease, unspecified: Secondary | ICD-10-CM

## 2019-06-15 DIAGNOSIS — E559 Vitamin D deficiency, unspecified: Secondary | ICD-10-CM

## 2019-06-15 DIAGNOSIS — R7303 Prediabetes: Secondary | ICD-10-CM

## 2019-06-15 DIAGNOSIS — I10 Essential (primary) hypertension: Secondary | ICD-10-CM

## 2019-06-15 DIAGNOSIS — M79604 Pain in right leg: Secondary | ICD-10-CM

## 2019-06-15 DIAGNOSIS — R350 Frequency of micturition: Secondary | ICD-10-CM

## 2019-06-15 DIAGNOSIS — N401 Enlarged prostate with lower urinary tract symptoms: Secondary | ICD-10-CM

## 2019-06-15 DIAGNOSIS — E785 Hyperlipidemia, unspecified: Secondary | ICD-10-CM

## 2019-06-17 ENCOUNTER — Other Ambulatory Visit: Payer: Self-pay

## 2019-06-17 ENCOUNTER — Other Ambulatory Visit (INDEPENDENT_AMBULATORY_CARE_PROVIDER_SITE_OTHER): Payer: Medicare Other

## 2019-06-17 DIAGNOSIS — E559 Vitamin D deficiency, unspecified: Secondary | ICD-10-CM

## 2019-06-17 DIAGNOSIS — M79604 Pain in right leg: Secondary | ICD-10-CM

## 2019-06-17 DIAGNOSIS — R350 Frequency of micturition: Secondary | ICD-10-CM | POA: Diagnosis not present

## 2019-06-17 DIAGNOSIS — N401 Enlarged prostate with lower urinary tract symptoms: Secondary | ICD-10-CM

## 2019-06-17 DIAGNOSIS — R7303 Prediabetes: Secondary | ICD-10-CM | POA: Diagnosis not present

## 2019-06-17 DIAGNOSIS — E785 Hyperlipidemia, unspecified: Secondary | ICD-10-CM

## 2019-06-17 LAB — COMPREHENSIVE METABOLIC PANEL
ALT: 41 U/L (ref 0–53)
AST: 23 U/L (ref 0–37)
Albumin: 4 g/dL (ref 3.5–5.2)
Alkaline Phosphatase: 68 U/L (ref 39–117)
BUN: 11 mg/dL (ref 6–23)
CO2: 31 mEq/L (ref 19–32)
Calcium: 9.6 mg/dL (ref 8.4–10.5)
Chloride: 99 mEq/L (ref 96–112)
Creatinine, Ser: 0.85 mg/dL (ref 0.40–1.50)
GFR: 89.65 mL/min (ref >60.00)
Glucose, Bld: 137 mg/dL — ABNORMAL HIGH (ref 70–99)
Potassium: 4.5 mEq/L (ref 3.5–5.1)
Sodium: 136 mEq/L (ref 135–145)
Total Bilirubin: 0.6 mg/dL (ref 0.2–1.2)
Total Protein: 6.2 g/dL (ref 6.0–8.3)

## 2019-06-17 LAB — CBC WITH DIFFERENTIAL/PLATELET
Basophils Absolute: 0 10*3/uL (ref 0.0–0.1)
Basophils Relative: 0.4 % (ref 0.0–3.0)
Eosinophils Absolute: 0.3 10*3/uL (ref 0.0–0.7)
Eosinophils Relative: 4.9 % (ref 0.0–5.0)
HCT: 37.7 % — ABNORMAL LOW (ref 39.0–52.0)
Hemoglobin: 12.7 g/dL — ABNORMAL LOW (ref 13.0–17.0)
Lymphocytes Relative: 32.8 % (ref 12.0–46.0)
Lymphs Abs: 1.7 10*3/uL (ref 0.7–4.0)
MCHC: 33.7 g/dL (ref 30.0–36.0)
MCV: 102.3 fl — ABNORMAL HIGH (ref 78.0–100.0)
Monocytes Absolute: 0.6 10*3/uL (ref 0.1–1.0)
Monocytes Relative: 10.5 % (ref 3.0–12.0)
Neutro Abs: 2.7 10*3/uL (ref 1.4–7.7)
Neutrophils Relative %: 51.4 % (ref 43.0–77.0)
Platelets: 235 10*3/uL (ref 150.0–400.0)
RBC: 3.68 Mil/uL — ABNORMAL LOW (ref 4.22–5.81)
RDW: 13.3 % (ref 11.5–15.5)
WBC: 5.3 10*3/uL (ref 4.0–10.5)

## 2019-06-17 LAB — LIPID PANEL
Cholesterol: 167 mg/dL (ref 0–200)
HDL: 93.4 mg/dL (ref >39.00)
LDL Cholesterol: 68 mg/dL (ref 0–99)
NonHDL: 73.51
Total CHOL/HDL Ratio: 2
Triglycerides: 26 mg/dL (ref 0.0–149.0)
VLDL: 5.2 mg/dL (ref 0.0–40.0)

## 2019-06-17 LAB — HEMOGLOBIN A1C: Hgb A1c MFr Bld: 5.7 % (ref 4.6–6.5)

## 2019-06-17 LAB — VITAMIN D 25 HYDROXY (VIT D DEFICIENCY, FRACTURES): VITD: 37.38 ng/mL (ref 30.00–100.00)

## 2019-06-17 LAB — CK: Total CK: 67 U/L (ref 7–232)

## 2019-06-17 LAB — SEDIMENTATION RATE: Sed Rate: 2 mm/hr (ref 0–20)

## 2019-06-17 LAB — PSA: PSA: 1.45 ng/mL (ref 0.10–4.00)

## 2019-06-18 ENCOUNTER — Other Ambulatory Visit (INDEPENDENT_AMBULATORY_CARE_PROVIDER_SITE_OTHER): Payer: Medicare Other

## 2019-06-18 DIAGNOSIS — D539 Nutritional anemia, unspecified: Secondary | ICD-10-CM | POA: Diagnosis not present

## 2019-06-18 LAB — VITAMIN B12: Vitamin B-12: 308 pg/mL (ref 211–911)

## 2019-06-20 ENCOUNTER — Encounter: Payer: Self-pay | Admitting: Family Medicine

## 2019-06-20 ENCOUNTER — Ambulatory Visit (INDEPENDENT_AMBULATORY_CARE_PROVIDER_SITE_OTHER): Payer: Medicare Other | Admitting: Family Medicine

## 2019-06-20 ENCOUNTER — Other Ambulatory Visit: Payer: Self-pay

## 2019-06-20 VITALS — BP 160/64 | HR 77 | Temp 97.7°F | Ht 66.0 in | Wt 209.4 lb

## 2019-06-20 DIAGNOSIS — I10 Essential (primary) hypertension: Secondary | ICD-10-CM | POA: Diagnosis not present

## 2019-06-20 DIAGNOSIS — D539 Nutritional anemia, unspecified: Secondary | ICD-10-CM

## 2019-06-20 DIAGNOSIS — M79651 Pain in right thigh: Secondary | ICD-10-CM

## 2019-06-20 MED ORDER — TIZANIDINE HCL 4 MG PO TABS: 4.0000 mg | ORAL_TABLET | Freq: Two times a day (BID) | ORAL | 0 refills | Status: DC | PRN

## 2019-06-20 NOTE — Assessment & Plan Note (Signed)
Elevated BP today, he is current uncomfortable due to thigh pain. Will monitor with low threshold to increase lisinopril.

## 2019-06-20 NOTE — Patient Instructions (Addendum)
Start vitamin b12 500-108mcg daily for low normal readings.  Trial off lipitor 10mg  for 2 weeks. Watch effect on right thigh pain. If ongoing pain, schedule appointment with Dr Lorelei Pont sports medicine.  Ok to continue tizanidine as needed.

## 2019-06-20 NOTE — Assessment & Plan Note (Signed)
Ongoing focal R thigh myalgia for last 4+ months, recent CPK and ESR normal. Unclear cause. Mild benefit with tizanidine muscle relaxant. L hip films normal last visit. ?statin related - will trial off lipitor for 2 wks and update with effect. Discussed possible advanced imaging. If ongoing, recommend sports med eval (copland) ?[place]. Pt agrees with plan.

## 2019-06-20 NOTE — Progress Notes (Signed)
This visit was conducted in person.  BP (!) 160/64 (BP Location: Right Arm, Patient Position: Sitting, Cuff Size: Large)   Pulse 77   Temp 97.7 F (36.5 C) (Temporal)   Ht _0  (1.676 m)   Wt 209 lb 7 oz (95 kg)   SpO2 95%   BMI 33.80 kg/m    CC: 9 mo f/u visit - discuss hip pain  Subjective:    Patient ID: [name], male    DOB: 1950/10/27, 68 y.o.   MRN: [id number]  HPI: [name] is a 68 y.o. male presenting on 06/20/2019 for Follow-up (Here for 9 mo f/u.  Wants to discuss tizanidine. )   See prior note for details on anterior R thigh pain. Thought hip flexor strain. Muscle relaxant tizanidine hasn't really helped. 4 mo h/o anterior R hip pain   Denies inciting trauma or falls.   H/o R TKR 2015.   DG Hip Unilat W OR W/O Pelvis 2-3 Views Right CLINICAL DATA:  Right anterior hip pain.  EXAM: DG HIP (WITH OR WITHOUT PELVIS) 2-3V RIGHT  COMPARISON:  Left hip radiographs 01/11/2011.  FINDINGS: The mineralization and alignment are normal. There is no evidence of acute fracture or dislocation. No evidence of femoral head avascular necrosis. The hip and sacroiliac joint spaces are preserved. There are mild degenerative changes within the lumbar spine. Iliofemoral atherosclerosis noted.  IMPRESSION: No acute osseous findings or significant hip arthropathic changes.  Electronically Signed   By: Richardean Sale M.D.   On: 04/24/2019 17:18        Relevant past medical, surgical, family and social history reviewed and updated as indicated. Interim medical history since our last visit reviewed. Allergies and medications reviewed and updated. Outpatient Medications Prior to Visit  Medication Sig Dispense Refill  . albuterol (PROVENTIL HFA;VENTOLIN HFA) 108 (90 Base) MCG/ACT inhaler INHALE 2 PUFFS INTO THE LUNGS EVERY 6 HOURS AS NEEDED FOR WHEEZING ORSHORTNESS OF BREATH 18 g 3  . Albuterol Sulfate (PROAIR RESPICLICK) 756 (90 Base) MCG/ACT AEPB Inhale 2  puffs into the lungs every 6 (six) hours as needed (cough, shortness of breath). 1 each 3  . aspirin EC 81 MG tablet Take 81 mg by mouth daily.    [name] atorvastatin (LIPITOR) 10 MG tablet Take 1 tablet (10 mg total) by mouth daily. 90 tablet 3  . budesonide-formoterol (SYMBICORT) 80-4.5 MCG/ACT inhaler Inhale 1 puff into the lungs 2 (two) times daily.    . Cholecalciferol (VITAMIN D3) 25 MCG (1000 UT) CAPS Take 1 capsule (1,000 Units total) by mouth daily. 30 capsule   . lisinopril (ZESTRIL) 10 MG tablet Take 1 tablet (10 mg total) by mouth daily.    . nicotine (NICOTROL) 10 MG inhaler Inhale 1 Cartridge (1 continuous puffing total) into the lungs as needed for smoking cessation. 42 each 1  . OVER THE COUNTER MEDICATION Take 1 capsule by mouth daily. [employer] B    . OVER THE COUNTER MEDICATION Take 1 capsule by mouth daily. [employer] Living Sulfurzyme (for healthy immune system and to boost liver function    . tiotropium (SPIRIVA) 18 MCG inhalation capsule Place 1 capsule (18 mcg total) into inhaler and inhale daily. 30 capsule 11  . tiZANidine (ZANAFLEX) 4 MG tablet Take 1 tablet (4 mg total) by mouth 2 (two) times daily as needed for muscle spasms. 60 tablet 0   No facility-administered medications prior to visit.      Per HPI unless specifically indicated  in ROS section below Review of Systems Objective:    BP (!) 160/64 (BP Location: Right Arm, Patient Position: Sitting, Cuff Size: Large)   Pulse 77   Temp 97.7 F (36.5 C) (Temporal)   Ht _0  (1.676 m)   Wt 209 lb 7 oz (95 kg)   SpO2 95%   BMI 33.80 kg/m   Wt Readings from Last 3 Encounters:  06/20/19 209 lb 7 oz (95 kg)  04/24/19 211 lb 5 oz (95.9 kg)  01/31/19 216 lb (98 kg)    Physical Exam Vitals signs and nursing note reviewed.  Constitutional:      General: He is not in acute distress.    Appearance: Normal appearance. He is obese. He is not ill-appearing.  Musculoskeletal: Normal range of motion.        General:  Tenderness present. No swelling.     Right lower leg: No edema.     Left lower leg: No edema.     Comments:  Neg seated SLR bilaterally. No pain with int/ext rotation at hip. No reproducible pain to palpation of R anterior thigh however he does have intermittent sharp stabbing pains anterior R thigh No palpable thigh masses  Skin:    General: Skin is warm and dry.     Findings: No rash.  Neurological:     Mental Status: He is alert.  Psychiatric:        Mood and Affect: Mood normal.        Behavior: Behavior normal.       Results for orders placed or performed in visit on 06/18/19  Vitamin B12  Result Value Ref Range   Vitamin B-12 308 211 - 911 pg/mL   Assessment & Plan:  This visit occurred during the SARS-CoV-2 public health emergency.  Safety protocols were in place, including screening questions prior to the visit, additional usage of staff PPE, and extensive cleaning of exam room while observing appropriate contact time as indicated for disinfecting solutions.   Problem List Items Addressed This Visit    Right thigh pain - Primary    Ongoing focal R thigh myalgia for last 4+ months, recent CPK and ESR normal. Unclear cause. Mild benefit with tizanidine muscle relaxant. L hip films normal last visit. ?statin related - will trial off lipitor for 2 wks and update with effect. Discussed possible advanced imaging. If ongoing, recommend sports med eval ([facility]land) ?[place]. Pt agrees with plan.       Macrocytic anemia    With low normal b12 - rec start B12 supplement 500-1076mg daily      Essential hypertension    Elevated BP today, he is current uncomfortable due to thigh pain. Will monitor with low threshold to increase lisinopril.           Meds ordered this encounter  Medications  . tiZANidine (ZANAFLEX) 4 MG tablet    Sig: Take 1 tablet (4 mg total) by mouth 2 (two) times daily as needed for muscle spasms.    Dispense:  60 tablet    Refill:  0   No orders of the  defined types were placed in this encounter.   Patient Instructions  Start vitamin b12 500-10067m daily for low normal readings.  Trial off lipitor 1060mor 2 weeks. Watch effect on right thigh pain. If ongoing pain, schedule appointment with Dr CopLorelei Pontorts medicine.  Ok to continue tizanidine as needed.    Follow up plan: Return if symptoms worsen or fail to improve.  Ria Bush, MD

## 2019-06-20 NOTE — Assessment & Plan Note (Signed)
With low normal b12 - rec start B12 supplement 500-1032mcg daily

## 2019-07-10 ENCOUNTER — Telehealth: Payer: Self-pay

## 2019-07-10 NOTE — Telephone Encounter (Addendum)
Spoke with pt relaying Dr. Synthia Innocent message.  Verbalizes understanding.  Pls schedule pt with another [employer] med doc since Dr. Lorelei Pont is not available. [see Dr. Synthia Innocent message below.]

## 2019-07-10 NOTE — Telephone Encounter (Signed)
If trial off lipitor didn't help, let's restart his lipitor.  Recommend eval with sports medicine - plz schedule him with Dr Lorelei Pont in office. If unable to see in next 1-2 weeks, please schedule with other [facility] sports med. Thank you.

## 2019-07-10 NOTE — Telephone Encounter (Signed)
Pt left v/m that he saw Dr Danise Mina on 06/20/19 about rt leg and the rt leg is no better and pt wants to know if should see specialist or what other testing needed.Please advise.

## 2019-07-11 NOTE — Telephone Encounter (Signed)
Noted  

## 2019-07-11 NOTE — Telephone Encounter (Signed)
I spoke with the patient he did not want to go to another office and said he would be okay with waiting for Dr. Lorelei Pont. Patient is scheduled for 1/6 at 11:20am.

## 2019-07-21 ENCOUNTER — Other Ambulatory Visit: Payer: Self-pay | Admitting: Family Medicine

## 2019-07-22 NOTE — Telephone Encounter (Signed)
Last filled on 06/20/2019 and LOV 06/20/2019 Next appointment with Dr Lorelei Pont on 08/01/2019

## 2019-07-24 ENCOUNTER — Ambulatory Visit: Payer: Medicare Other | Admitting: Family Medicine

## 2019-08-01 ENCOUNTER — Ambulatory Visit (INDEPENDENT_AMBULATORY_CARE_PROVIDER_SITE_OTHER): Payer: Medicare Other | Admitting: Family Medicine

## 2019-08-01 ENCOUNTER — Encounter: Payer: Self-pay | Admitting: Family Medicine

## 2019-08-01 ENCOUNTER — Other Ambulatory Visit: Payer: Self-pay

## 2019-08-01 ENCOUNTER — Ambulatory Visit (INDEPENDENT_AMBULATORY_CARE_PROVIDER_SITE_OTHER)
Admission: RE | Admit: 2019-08-01 | Discharge: 2019-08-01 | Disposition: A | Discharge status: Home / Self Care | Payer: Medicare Other | Source: Ambulatory Visit | Attending: Family Medicine | Admitting: Family Medicine

## 2019-08-01 VITALS — BP 158/80 | HR 76 | Temp 98.6°F | Ht 66.0 in | Wt 208.5 lb

## 2019-08-01 DIAGNOSIS — R200 Anesthesia of skin: Secondary | ICD-10-CM | POA: Diagnosis not present

## 2019-08-01 DIAGNOSIS — M1711 Unilateral primary osteoarthritis, right knee: Secondary | ICD-10-CM | POA: Diagnosis not present

## 2019-08-01 DIAGNOSIS — R29898 Other symptoms and signs involving the musculoskeletal system: Secondary | ICD-10-CM | POA: Diagnosis not present

## 2019-08-01 DIAGNOSIS — Z96651 Presence of right artificial knee joint: Secondary | ICD-10-CM | POA: Diagnosis not present

## 2019-08-01 DIAGNOSIS — M7989 Other specified soft tissue disorders: Secondary | ICD-10-CM | POA: Diagnosis not present

## 2019-08-01 DIAGNOSIS — M25561 Pain in right knee: Secondary | ICD-10-CM

## 2019-08-01 DIAGNOSIS — M79651 Pain in right thigh: Secondary | ICD-10-CM | POA: Diagnosis not present

## 2019-08-01 MED ORDER — PREDNISONE 20 MG PO TABS: ORAL_TABLET | ORAL | 0 refills | Status: DC

## 2019-08-01 NOTE — Progress Notes (Signed)
[name] T. Pedro Jacuinde, MD Primary Care and Sports Medicine [facility] at [facility] [place], [address] Phone: [phone]  FAX: [phone]  [name] - 69 y.o. male  MRN [id number]  Date of Birth: [birth year]  Visit Date: 08/01/2019  PCP: Pedro Bush, MD  Referred by: Pedro Bush, MD  Chief Complaint  Patient presents with  . Leg Pain    Right-No Injury    This visit occurred during the SARS-CoV-2 public health emergency.  Safety protocols were in place, including screening questions prior to the visit, additional usage of staff PPE, and extensive cleaning of exam room while observing appropriate contact time as indicated for disinfecting solutions.   Subjective:   [name] is a 69 y.o. very pleasant male patient with Body mass index is 33.65 kg/m. who presents with the following:  He has had some right anterior thigh pain for about 6 months.  He did have a total knee arthroplasty in this knee approximately 3 years ago by Dr. Ninfa [name].  He is having pain and dysfunction and some symptomatic giving way.  He feels sometimes as if his thigh has a knife sticking into it.  At this point he is losing some function.  He denies any back pain.  He is not having any distal lower extremity pain.  He also is having some anterior thigh numbness.  He denies any radicular symptoms, tingling.  Right knee pain will have pain and dysfunction and has a knife in his thigh.  Will not do what he wants it to do.  6 months.  TKA 3 years ago negative  No back pain. No distal pain.   Past Medical History, Surgical History, Social History, Family History, Problem List, Medications, and Allergies have been reviewed and updated if relevant.   GEN: No fevers, chills. Nontoxic. Primarily MSK c/o today. MSK: Detailed in the HPI GI: tolerating PO intake without difficulty Neuro: As noted in the HPI below.  Otherwise the pertinent positives  of the ROS are noted above.   Objective:   BP (!) 158/80   Pulse 76   Temp 98.6 F (37 C) (Temporal)   Ht 5\' 6"  (1.676 m)   Wt 208 lb 8 oz (94.6 kg)   SpO2 97%   BMI 33.65 kg/m    GEN: WDWN, NAD, Non-toxic, Alert & Oriented x 3 HEENT: Atraumatic, Normocephalic.  Ears and Nose: No external deformity. EXTR: No clubbing/cyanosis/edema NEURO: Normal gait.  PSYCH: Normally interactive. Conversant. Not depressed or anxious appearing.  Calm demeanor.   HIP EXAM: SIDE: R ROM: Abduction, Flexion, Internal and External range of motion: Abduction is full.  He does have some pain with flexion.  With the hip flexed to 90 degrees he has approximately 30 degrees of internal range of motion and external range of motion. Pain with terminal IROM and EROM: Minimal GTB: NT SLR: Pain with no radicular symptoms Knees: No effusion FABER: NT REVERSE FABER: Pain Piriformis: NT at direct palpation Str: flexion: 3/5 and pain abduction: 4+/5 adduction: 3/5 pain  The right anterior thigh is having some decreased sensation, and the contralateral thigh is normal.  The  The rest of the lower extremities are neurovascularly intact.    Radiology: CLINICAL DATA:  Right anterior hip pain.  EXAM: DG HIP (WITH OR WITHOUT PELVIS) 2-3V RIGHT  COMPARISON:  Left hip radiographs 01/11/2011.  FINDINGS: The mineralization and alignment are normal. There is no evidence of acute fracture or  dislocation. No evidence of femoral head avascular necrosis. The hip and sacroiliac joint spaces are preserved. There are mild degenerative changes within the lumbar spine. Iliofemoral atherosclerosis noted.  IMPRESSION: No acute osseous findings or significant hip arthropathic changes.   Electronically Signed   By: [name] M.D.   On: 04/24/2019 17:18  Assessment and Plan:     ICD-10-CM   1. Right thigh pain  M79.651 Ambulatory referral to Physical Therapy  2. Recurrent pain of right knee   M25.561 XR Knee Complete 4 Views Right  3. Status post total right knee replacement  Z96.651 XR Knee Complete 4 Views Right    Ambulatory referral to Physical Therapy  4. Thigh numbness  R20.0 Ambulatory referral to Physical Therapy  5. Weakness of right leg  R29.898 Ambulatory referral to Physical Therapy   I think that the primary issue here is that the patient is remarkably weak.  This is in the right leg.  This may relate to his prior total knee arthroplasty.  He also has some thigh pain as well as some numbness.  This certainly could represent radiation from the lumbar spine as well.  Begin basic conservative care, pulse with steroids and send him to physical therapy.  He has not improved with basic home rehab.  Follow-up: Return in about 6 weeks (around 09/12/2019).  Meds ordered this encounter  Medications  . predniSONE (DELTASONE) 20 MG tablet    Sig: 2 tabs po for 7 days, then 1 tab po for 7 days    Dispense:  21 tablet    Refill:  0   Orders Placed This Encounter  Procedures  . XR Knee Complete 4 Views Right  . Ambulatory referral to Physical Therapy    Signed,  Frederico [name] T. Pedro Raider, MD   Outpatient Encounter Medications as of 08/01/2019  Medication Sig  . albuterol (PROVENTIL HFA;VENTOLIN HFA) 108 (90 Base) MCG/ACT inhaler INHALE 2 PUFFS INTO THE LUNGS EVERY 6 HOURS AS NEEDED FOR WHEEZING ORSHORTNESS OF BREATH  . Albuterol Sulfate (PROAIR RESPICLICK) 123XX123 (90 Base) MCG/ACT AEPB Inhale 2 puffs into the lungs every 6 (six) hours as needed (cough, shortness of breath).  [name] aspirin EC 81 MG tablet Take 81 mg by mouth daily.  [name] atorvastatin (LIPITOR) 10 MG tablet Take 1 tablet (10 mg total) by mouth daily.  . budesonide-formoterol (SYMBICORT) 80-4.5 MCG/ACT inhaler Inhale 1 puff into the lungs 2 (two) times daily.  . Cholecalciferol (VITAMIN D3) 25 MCG (1000 UT) CAPS Take 1 capsule (1,000 Units total) by mouth daily.  [name] lisinopril (ZESTRIL) 10 MG tablet Take 1 tablet (10 mg  total) by mouth daily.  . nicotine (NICOTROL) 10 MG inhaler Inhale 1 Cartridge (1 continuous puffing total) into the lungs as needed for smoking cessation.  [name] OVER THE COUNTER MEDICATION Take 1 capsule by mouth daily. [employer] B  . OVER THE COUNTER MEDICATION Take 1 capsule by mouth daily. [employer] Sulfurzyme (for healthy immune system and to boost liver function  . tiotropium (SPIRIVA) 18 MCG inhalation capsule Place 1 capsule (18 mcg total) into inhaler and inhale daily.  [name] tiZANidine (ZANAFLEX) 4 MG tablet TAKE 1 TABLET(4 MG) BY MOUTH TWICE DAILY AS NEEDED FOR MUSCLE SPASMS  . predniSONE (DELTASONE) 20 MG tablet 2 tabs po for 7 days, then 1 tab po for 7 days   No facility-administered encounter medications on file as of 08/01/2019.

## 2019-08-05 ENCOUNTER — Encounter: Payer: Self-pay | Admitting: Family Medicine

## 2019-08-19 DIAGNOSIS — M79651 Pain in right thigh: Secondary | ICD-10-CM | POA: Diagnosis not present

## 2019-08-19 DIAGNOSIS — M6281 Muscle weakness (generalized): Secondary | ICD-10-CM | POA: Diagnosis not present

## 2019-08-19 DIAGNOSIS — M25661 Stiffness of right knee, not elsewhere classified: Secondary | ICD-10-CM | POA: Diagnosis not present

## 2019-08-19 DIAGNOSIS — M25651 Stiffness of right hip, not elsewhere classified: Secondary | ICD-10-CM | POA: Diagnosis not present

## 2019-08-23 DIAGNOSIS — M79651 Pain in right thigh: Secondary | ICD-10-CM | POA: Diagnosis not present

## 2019-08-23 DIAGNOSIS — M25651 Stiffness of right hip, not elsewhere classified: Secondary | ICD-10-CM | POA: Diagnosis not present

## 2019-08-23 DIAGNOSIS — M25661 Stiffness of right knee, not elsewhere classified: Secondary | ICD-10-CM | POA: Diagnosis not present

## 2019-08-23 DIAGNOSIS — M6281 Muscle weakness (generalized): Secondary | ICD-10-CM | POA: Diagnosis not present

## 2019-08-24 ENCOUNTER — Ambulatory Visit: Payer: Medicare Other | Attending: Internal Medicine

## 2019-08-24 DIAGNOSIS — Z23 Encounter for immunization: Secondary | ICD-10-CM

## 2019-08-24 NOTE — Progress Notes (Signed)
   Covid-19 Vaccination Clinic  Name:  [name]    MRN: [id number] DOB: [birth year]  08/24/2019  [name] was observed post Covid-19 immunization for 15 minutes without incidence. He was provided with Vaccine Information Sheet and instruction to access the V-Safe system.   [name] was instructed to call 911 with any severe reactions post vaccine: [name] Difficulty breathing  . Swelling of your face and throat  . A fast heartbeat  . A bad rash all over your body  . Dizziness and weakness    Immunizations Administered    Name Date Dose VIS Date Route   Pfizer COVID-19 Vaccine 08/24/2019 12:22 PM 0.3 mL 06/28/2019 Intramuscular   Manufacturer: [employer]   Lot: [id number]   [place]: [id number]

## 2019-08-27 DIAGNOSIS — M79651 Pain in right thigh: Secondary | ICD-10-CM | POA: Diagnosis not present

## 2019-08-27 DIAGNOSIS — M25661 Stiffness of right knee, not elsewhere classified: Secondary | ICD-10-CM | POA: Diagnosis not present

## 2019-08-27 DIAGNOSIS — M25651 Stiffness of right hip, not elsewhere classified: Secondary | ICD-10-CM | POA: Diagnosis not present

## 2019-08-27 DIAGNOSIS — M6281 Muscle weakness (generalized): Secondary | ICD-10-CM | POA: Diagnosis not present

## 2019-08-30 DIAGNOSIS — M79651 Pain in right thigh: Secondary | ICD-10-CM | POA: Diagnosis not present

## 2019-08-30 DIAGNOSIS — M25651 Stiffness of right hip, not elsewhere classified: Secondary | ICD-10-CM | POA: Diagnosis not present

## 2019-08-30 DIAGNOSIS — M25661 Stiffness of right knee, not elsewhere classified: Secondary | ICD-10-CM | POA: Diagnosis not present

## 2019-08-30 DIAGNOSIS — M6281 Muscle weakness (generalized): Secondary | ICD-10-CM | POA: Diagnosis not present

## 2019-09-03 DIAGNOSIS — M79651 Pain in right thigh: Secondary | ICD-10-CM | POA: Diagnosis not present

## 2019-09-03 DIAGNOSIS — M25661 Stiffness of right knee, not elsewhere classified: Secondary | ICD-10-CM | POA: Diagnosis not present

## 2019-09-03 DIAGNOSIS — M6281 Muscle weakness (generalized): Secondary | ICD-10-CM | POA: Diagnosis not present

## 2019-09-03 DIAGNOSIS — M25651 Stiffness of right hip, not elsewhere classified: Secondary | ICD-10-CM | POA: Diagnosis not present

## 2019-09-06 DIAGNOSIS — M25651 Stiffness of right hip, not elsewhere classified: Secondary | ICD-10-CM | POA: Diagnosis not present

## 2019-09-06 DIAGNOSIS — M79651 Pain in right thigh: Secondary | ICD-10-CM | POA: Diagnosis not present

## 2019-09-06 DIAGNOSIS — M25661 Stiffness of right knee, not elsewhere classified: Secondary | ICD-10-CM | POA: Diagnosis not present

## 2019-09-06 DIAGNOSIS — M6281 Muscle weakness (generalized): Secondary | ICD-10-CM | POA: Diagnosis not present

## 2019-09-09 DIAGNOSIS — M25661 Stiffness of right knee, not elsewhere classified: Secondary | ICD-10-CM | POA: Diagnosis not present

## 2019-09-09 DIAGNOSIS — M79651 Pain in right thigh: Secondary | ICD-10-CM | POA: Diagnosis not present

## 2019-09-09 DIAGNOSIS — M25651 Stiffness of right hip, not elsewhere classified: Secondary | ICD-10-CM | POA: Diagnosis not present

## 2019-09-09 DIAGNOSIS — M6281 Muscle weakness (generalized): Secondary | ICD-10-CM | POA: Diagnosis not present

## 2019-09-10 NOTE — Progress Notes (Signed)
Pedro Allinson T. Deanza Upperman, MD Primary Care and Sports Medicine [facility] at [facility] [place], [address] Phone: [phone]  FAX: [phone]  [name] - 69 y.o. male  MRN [id number]  Date of Birth: October 22, 1950  Visit Date: 09/11/2019  PCP: Ria Bush, MD  Referred by: Ria Bush, MD  Chief Complaint  Patient presents with  . Follow-up    Right Leg Pain    This visit occurred during the SARS-CoV-2 public health emergency.  Safety protocols were in place, including screening questions prior to the visit, additional usage of staff PPE, and extensive cleaning of exam room while observing appropriate contact time as indicated for disinfecting solutions.   Subjective:   [name] is a 69 y.o. very pleasant male patient with Body mass index is 32.93 kg/m. who presents with the following:  Previously saw the patient on August 01, 2019.  At that point he was having some thigh pain.  At that point he was having unremarkable weakness.  I gave him a burst of some steroids and had him do some physical therapy to work on his strength.  50% better.  He still has some notable atrophy on the right quadricep, but he does feel like his strength and balance is improving quite a bit and is pain is diminished by about 50% and is still has primarily pain distally.  Now about 6 inches from knee up  50% better  08/01/2019 Last OV with Pedro Loffler, MD  He has had some right anterior thigh pain for about 6 months.  He did have a total knee arthroplasty in this knee approximately 3 years ago by Dr. Ninfa Linden.  He is having pain and dysfunction and some symptomatic giving way.  He feels sometimes as if his thigh has a knife sticking into it.  At this point he is losing some function.  He denies any back pain.  He is not having any distal lower extremity pain.   He also is having some anterior thigh numbness.  He denies any radicular  symptoms, tingling.   Right knee pain will have pain and dysfunction and has a knife in his thigh.  Will not do what he wants it to do.  6 months.  TKA 3 years ago negative   No back pain. No distal pain.     Review of Systems is noted in the HPI, as appropriate   Objective:   BP (!) 142/78   Pulse 74   Temp 98.6 F (37 C) (Temporal)   Ht 5\' 6"  (1.676 m)   Wt 204 lb (92.5 kg)   SpO2 97%   BMI 32.93 kg/m   GEN: No acute distress; alert,appropriate. PULM: Breathing comfortably in no respiratory distress PSYCH: Normally interactive.    Atrophy of the right quadricep.  Excellent range of motion at the hip as well as a stable total knee arthroplasty on the right with appropriate stress to varus and valgus stress.  Nontender throughout palpation of the knee.  No effusion.  Dramatic difference on quadricep volume on the left compared to the right  Radiology: No results found.  Assessment and Plan:     ICD-10-CM   1. Weakness of right leg  R29.898   2. Recurrent pain of right knee  M25.561   3. Status post total right knee replacement  Z96.651   4. Thigh numbness  R20.0    Quite significant improvement compared to last exam.  Still has quadriceps atrophy on the right compared to the left, but strength is improving.  Pain is improving.  50% improvement per patient's report.  Mild numbness has recovered in part.  I encouraged him and I think that he will likely have to do some leg strengthening and balance work for the rest of his life.  Follow-up: No follow-ups on file.  No orders of the defined types were placed in this encounter.  There are no discontinued medications. No orders of the defined types were placed in this encounter.   Signed,  Maud Deed. Keishaun Hazel, MD   Outpatient Encounter Medications as of 09/11/2019  Medication Sig  . albuterol (PROVENTIL HFA;VENTOLIN HFA) 108 (90 Base) MCG/ACT inhaler INHALE 2 PUFFS INTO THE LUNGS EVERY 6 HOURS AS NEEDED FOR  WHEEZING ORSHORTNESS OF BREATH  . Albuterol Sulfate (PROAIR RESPICLICK) 123XX123 (90 Base) MCG/ACT AEPB Inhale 2 puffs into the lungs every 6 (six) hours as needed (cough, shortness of breath).  [name] aspirin EC 81 MG tablet Take 81 mg by mouth daily.  [name] atorvastatin (LIPITOR) 10 MG tablet Take 1 tablet (10 mg total) by mouth daily.  . budesonide-formoterol (SYMBICORT) 80-4.5 MCG/ACT inhaler Inhale 1 puff into the lungs 2 (two) times daily.  . Cholecalciferol (VITAMIN D3) 25 MCG (1000 UT) CAPS Take 1 capsule (1,000 Units total) by mouth daily.  [name] lisinopril (ZESTRIL) 10 MG tablet Take 1 tablet (10 mg total) by mouth daily.  . nicotine (NICOTROL) 10 MG inhaler Inhale 1 Cartridge (1 continuous puffing total) into the lungs as needed for smoking cessation.  [name] OVER THE COUNTER MEDICATION Take 1 capsule by mouth daily. [employer] B  . OVER THE COUNTER MEDICATION Take 1 capsule by mouth daily. [employer] Sulfurzyme (for healthy immune system and to boost liver function  . predniSONE (DELTASONE) 20 MG tablet 2 tabs po for 7 days, then 1 tab po for 7 days  . tiotropium (SPIRIVA) 18 MCG inhalation capsule Place 1 capsule (18 mcg total) into inhaler and inhale daily.  [name] tiZANidine (ZANAFLEX) 4 MG tablet TAKE 1 TABLET(4 MG) BY MOUTH TWICE DAILY AS NEEDED FOR MUSCLE SPASMS   No facility-administered encounter medications on file as of 09/11/2019.

## 2019-09-11 ENCOUNTER — Encounter: Payer: Self-pay | Admitting: Family Medicine

## 2019-09-11 ENCOUNTER — Other Ambulatory Visit: Payer: Self-pay

## 2019-09-11 ENCOUNTER — Ambulatory Visit (INDEPENDENT_AMBULATORY_CARE_PROVIDER_SITE_OTHER): Payer: Medicare Other | Admitting: Family Medicine

## 2019-09-11 VITALS — BP 142/78 | HR 74 | Temp 98.6°F | Ht 66.0 in | Wt 204.0 lb

## 2019-09-11 DIAGNOSIS — R200 Anesthesia of skin: Secondary | ICD-10-CM | POA: Diagnosis not present

## 2019-09-11 DIAGNOSIS — R29898 Other symptoms and signs involving the musculoskeletal system: Secondary | ICD-10-CM | POA: Diagnosis not present

## 2019-09-11 DIAGNOSIS — M25561 Pain in right knee: Secondary | ICD-10-CM

## 2019-09-11 DIAGNOSIS — Z96651 Presence of right artificial knee joint: Secondary | ICD-10-CM

## 2019-09-13 DIAGNOSIS — M79651 Pain in right thigh: Secondary | ICD-10-CM | POA: Diagnosis not present

## 2019-09-13 DIAGNOSIS — M25661 Stiffness of right knee, not elsewhere classified: Secondary | ICD-10-CM | POA: Diagnosis not present

## 2019-09-13 DIAGNOSIS — M25651 Stiffness of right hip, not elsewhere classified: Secondary | ICD-10-CM | POA: Diagnosis not present

## 2019-09-13 DIAGNOSIS — M6281 Muscle weakness (generalized): Secondary | ICD-10-CM | POA: Diagnosis not present

## 2019-09-16 DIAGNOSIS — M79651 Pain in right thigh: Secondary | ICD-10-CM | POA: Diagnosis not present

## 2019-09-16 DIAGNOSIS — M25661 Stiffness of right knee, not elsewhere classified: Secondary | ICD-10-CM | POA: Diagnosis not present

## 2019-09-16 DIAGNOSIS — M6281 Muscle weakness (generalized): Secondary | ICD-10-CM | POA: Diagnosis not present

## 2019-09-16 DIAGNOSIS — M25651 Stiffness of right hip, not elsewhere classified: Secondary | ICD-10-CM | POA: Diagnosis not present

## 2019-09-17 ENCOUNTER — Ambulatory Visit: Payer: Medicare Other | Attending: Internal Medicine

## 2019-09-17 DIAGNOSIS — Z23 Encounter for immunization: Secondary | ICD-10-CM | POA: Insufficient documentation

## 2019-09-17 NOTE — Progress Notes (Signed)
   Covid-19 Vaccination Clinic  Name:  [name]    MRN: [id number] DOB: 1951/03/30  09/17/2019  Mr. [name] was observed post Covid-19 immunization for 15 minutes without incident. He was provided with Vaccine Information Sheet and instruction to access the V-Safe system.   Mr. [name] was instructed to call 911 with any severe reactions post vaccine: [name] Difficulty breathing  . Swelling of face and throat  . A fast heartbeat  . A bad rash all over body  . Dizziness and weakness   Immunizations Administered    Name Date Dose VIS Date Route   Pfizer COVID-19 Vaccine 09/17/2019  9:23 AM 0.3 mL 06/28/2019 Intramuscular   Manufacturer: [place]   Lot: [id number]   [place]: [id number]

## 2019-09-30 ENCOUNTER — Other Ambulatory Visit: Payer: Self-pay

## 2019-09-30 ENCOUNTER — Other Ambulatory Visit (INDEPENDENT_AMBULATORY_CARE_PROVIDER_SITE_OTHER): Payer: Medicare Other

## 2019-09-30 ENCOUNTER — Other Ambulatory Visit: Payer: Self-pay | Admitting: Family Medicine

## 2019-09-30 DIAGNOSIS — D539 Nutritional anemia, unspecified: Secondary | ICD-10-CM

## 2019-09-30 DIAGNOSIS — E785 Hyperlipidemia, unspecified: Secondary | ICD-10-CM

## 2019-09-30 DIAGNOSIS — R7303 Prediabetes: Secondary | ICD-10-CM

## 2019-09-30 DIAGNOSIS — N401 Enlarged prostate with lower urinary tract symptoms: Secondary | ICD-10-CM

## 2019-09-30 DIAGNOSIS — R350 Frequency of micturition: Secondary | ICD-10-CM | POA: Diagnosis not present

## 2019-09-30 DIAGNOSIS — E559 Vitamin D deficiency, unspecified: Secondary | ICD-10-CM | POA: Diagnosis not present

## 2019-09-30 LAB — CBC WITH DIFFERENTIAL/PLATELET
Basophils Absolute: 0 10*3/uL (ref 0.0–0.1)
Basophils Relative: 0.4 % (ref 0.0–3.0)
Eosinophils Absolute: 0.1 10*3/uL (ref 0.0–0.7)
Eosinophils Relative: 2.9 % (ref 0.0–5.0)
HCT: 39.8 % (ref 39.0–52.0)
Hemoglobin: 13.7 g/dL (ref 13.0–17.0)
Lymphocytes Relative: 40.6 % (ref 12.0–46.0)
Lymphs Abs: 2.1 10*3/uL (ref 0.7–4.0)
MCHC: 34.3 g/dL (ref 30.0–36.0)
MCV: 100.2 fl — ABNORMAL HIGH (ref 78.0–100.0)
Monocytes Absolute: 0.5 10*3/uL (ref 0.1–1.0)
Monocytes Relative: 9.8 % (ref 3.0–12.0)
Neutro Abs: 2.4 10*3/uL (ref 1.4–7.7)
Neutrophils Relative %: 46.3 % (ref 43.0–77.0)
Platelets: 261 10*3/uL (ref 150.0–400.0)
RBC: 3.97 Mil/uL — ABNORMAL LOW (ref 4.22–5.81)
RDW: 13 % (ref 11.5–15.5)
WBC: 5.2 10*3/uL (ref 4.0–10.5)

## 2019-09-30 LAB — LIPID PANEL
Cholesterol: 171 mg/dL (ref 0–200)
HDL: 98.8 mg/dL (ref >39.00)
LDL Cholesterol: 65 mg/dL (ref 0–99)
NonHDL: 72.07
Total CHOL/HDL Ratio: 2
Triglycerides: 35 mg/dL (ref 0.0–149.0)
VLDL: 7 mg/dL (ref 0.0–40.0)

## 2019-09-30 LAB — COMPREHENSIVE METABOLIC PANEL
ALT: 25 U/L (ref 0–53)
AST: 19 U/L (ref 0–37)
Albumin: 4.3 g/dL (ref 3.5–5.2)
Alkaline Phosphatase: 73 U/L (ref 39–117)
BUN: 12 mg/dL (ref 6–23)
CO2: 26 mEq/L (ref 19–32)
Calcium: 9.3 mg/dL (ref 8.4–10.5)
Chloride: 98 mEq/L (ref 96–112)
Creatinine, Ser: 0.88 mg/dL (ref 0.40–1.50)
GFR: 86.05 mL/min (ref >60.00)
Glucose, Bld: 134 mg/dL — ABNORMAL HIGH (ref 70–99)
Potassium: 4.4 mEq/L (ref 3.5–5.1)
Sodium: 133 mEq/L — ABNORMAL LOW (ref 135–145)
Total Bilirubin: 0.7 mg/dL (ref 0.2–1.2)
Total Protein: 6.7 g/dL (ref 6.0–8.3)

## 2019-09-30 LAB — HEMOGLOBIN A1C: Hgb A1c MFr Bld: 5.8 % (ref 4.6–6.5)

## 2019-09-30 LAB — PSA: PSA: 1.81 ng/mL (ref 0.10–4.00)

## 2019-09-30 LAB — VITAMIN D 25 HYDROXY (VIT D DEFICIENCY, FRACTURES): VITD: 48.18 ng/mL (ref 30.00–100.00)

## 2019-10-02 ENCOUNTER — Other Ambulatory Visit: Payer: Self-pay

## 2019-10-02 ENCOUNTER — Ambulatory Visit (INDEPENDENT_AMBULATORY_CARE_PROVIDER_SITE_OTHER): Payer: Medicare Other

## 2019-10-02 VITALS — BP 158/68 | Wt 200.0 lb

## 2019-10-02 DIAGNOSIS — Z0000 Encounter for general adult medical examination without abnormal findings: Secondary | ICD-10-CM

## 2019-10-02 NOTE — Progress Notes (Signed)
PCP notes:  Health Maintenance: No gaps noted   Abnormal Screenings: Alcohol screening score- 13, drinks 5-7 beers daily    Patient concerns: none   Nurse concerns: Daily alcohol intake of 5-7 beers daily    Next PCP appt.: 10/03/2019 @ 9:30 am

## 2019-10-02 NOTE — Patient Instructions (Signed)
[name] , Thank you for taking time to come for your Medicare Wellness Visit. I appreciate your ongoing commitment to your health goals. Please review the following plan we discussed and let me know if I can assist you in the future.   Screening recommendations/referrals: Colonoscopy: completed 01/04/2011 Recommended yearly ophthalmology/optometry visit for glaucoma screening and checkup Recommended yearly dental visit for hygiene and checkup  Vaccinations: Influenza vaccine: Up to date, completed 04/23/2019 Pneumococcal vaccine: Completed series Tdap vaccine: Up to date, completed 08/24/2012 Shingles vaccine: discussed    Advanced directives: Please bring a copy of your POA (Power of [place]) and/or Living Will to your next appointment.   Conditions/risks identified: hypertension, hyperlipidemia  Next appointment: 10/03/2019 @ 9:30 am   Preventive Care 65 Years and Older, Male Preventive care refers to lifestyle choices and visits with your health care provider that can promote health and wellness. What does preventive care include?  A yearly physical exam. This is also called an annual well check.  Dental exams once or twice a year.  Routine eye exams. Ask your health care provider how often you should have your eyes checked.  Personal lifestyle choices, including:  Daily care of your teeth and gums.  Regular physical activity.  Eating a healthy diet.  Avoiding tobacco and drug use.  Limiting alcohol use.  Practicing safe sex.  Taking low doses of aspirin every day.  Taking vitamin and mineral supplements as recommended by your health care provider. What happens during an annual well check? The services and screenings done by your health care provider during your annual well check will depend on your age, overall health, lifestyle risk factors, and family history of disease. Counseling  Your health care provider may ask you questions about your:  Alcohol  use.  Tobacco use.  Drug use.  Emotional well-being.  Home and relationship well-being.  Sexual activity.  Eating habits.  History of falls.  Memory and ability to understand (cognition).  Work and work Statistician. Screening  You may have the following tests or measurements:  Height, weight, and BMI.  Blood pressure.  Lipid and cholesterol levels. These may be checked every 5 years, or more frequently if you are over 15 years old.  Skin check.  Lung cancer screening. You may have this screening every year starting at age 43 if you have a 30-pack-year history of smoking and currently smoke or have quit within the past 15 years.  Fecal occult blood test (FOBT) of the stool. You may have this test every year starting at age 16.  Flexible sigmoidoscopy or colonoscopy. You may have a sigmoidoscopy every 5 years or a colonoscopy every 10 years starting at age 84.  Prostate cancer screening. Recommendations will vary depending on your family history and other risks.  Hepatitis C blood test.  Hepatitis B blood test.  Sexually transmitted disease (STD) testing.  Diabetes screening. This is done by checking your blood sugar (glucose) after you have not eaten for a while (fasting). You may have this done every 1-3 years.  Abdominal aortic aneurysm (AAA) screening. You may need this if you are a current or former smoker.  Osteoporosis. You may be screened starting at age 37 if you are at high risk. Talk with your health care provider about your test results, treatment options, and if necessary, the need for more tests. Vaccines  Your health care provider may recommend certain vaccines, such as:  Influenza vaccine. This is recommended every year.  Tetanus, diphtheria, and  acellular pertussis (Tdap, Td) vaccine. You may need a Td booster every 10 years.  Zoster vaccine. You may need this after age 72.  Pneumococcal 13-valent conjugate (PCV13) vaccine. One dose is  recommended after age 48.  Pneumococcal polysaccharide (PPSV23) vaccine. One dose is recommended after age 17. Talk to your health care provider about which screenings and vaccines you need and how often you need them. This information is not intended to replace advice given to you by your health care provider. Make sure you discuss any questions you have with your health care provider. Document Released: 07/31/2015 Document Revised: 03/23/2016 Document Reviewed: 05/05/2015 Elsevier Interactive Patient Education  2017 [place] Prevention in the Home Falls can cause injuries. They can happen to people of all ages. There are many things you can do to make your home safe and to help prevent falls. What can I do on the outside of my home?  Regularly fix the edges of walkways and driveways and fix any cracks.  Remove anything that might make you trip as you walk through a door, such as a raised step or threshold.  Trim any bushes or trees on the path to your home.  Use bright outdoor lighting.  Clear any walking paths of anything that might make someone trip, such as rocks or tools.  Regularly check to see if handrails are loose or broken. Make sure that both sides of any steps have handrails.  Any raised decks and porches should have guardrails on the edges.  Have any leaves, snow, or ice cleared regularly.  Use sand or salt on walking paths during winter.  Clean up any spills in your garage right away. This includes oil or grease spills. What can I do in the bathroom?  Use night lights.  Install grab bars by the toilet and in the tub and shower. Do not use towel bars as grab bars.  Use non-skid mats or decals in the tub or shower.  If you need to sit down in the shower, use a plastic, non-slip stool.  Keep the floor dry. Clean up any water that spills on the floor as soon as it happens.  Remove soap buildup in the tub or shower regularly.  Attach bath mats  securely with double-sided non-slip rug tape.  Do not have throw rugs and other things on the floor that can make you trip. What can I do in the bedroom?  Use night lights.  Make sure that you have a light by your bed that is easy to reach.  Do not use any sheets or blankets that are too big for your bed. They should not hang down onto the floor.  Have a firm chair that has side arms. You can use this for support while you get dressed.  Do not have throw rugs and other things on the floor that can make you trip. What can I do in the kitchen?  Clean up any spills right away.  Avoid walking on wet floors.  Keep items that you use a lot in easy-to-reach places.  If you need to reach something above you, use a strong step stool that has a grab bar.  Keep electrical cords out of the way.  Do not use floor polish or wax that makes floors slippery. If you must use wax, use non-skid floor wax.  Do not have throw rugs and other things on the floor that can make you trip. What can I do with my stairs?  Do not leave any items on the stairs.  Make sure that there are handrails on both sides of the stairs and use them. Fix handrails that are broken or loose. Make sure that handrails are as long as the stairways.  Check any carpeting to make sure that it is firmly attached to the stairs. Fix any carpet that is loose or worn.  Avoid having throw rugs at the top or bottom of the stairs. If you do have throw rugs, attach them to the floor with carpet tape.  Make sure that you have a light switch at the top of the stairs and the bottom of the stairs. If you do not have them, ask someone to add them for you. What else can I do to help prevent falls?  Wear shoes that:  Do not have high heels.  Have rubber bottoms.  Are comfortable and fit you well.  Are closed at the toe. Do not wear sandals.  If you use a stepladder:  Make sure that it is fully opened. Do not climb a closed  stepladder.  Make sure that both sides of the stepladder are locked into place.  Ask someone to hold it for you, if possible.  Clearly mark and make sure that you can see:  Any grab bars or handrails.  First and last steps.  Where the edge of each step is.  Use tools that help you move around (mobility aids) if they are needed. These include:  Canes.  Walkers.  Scooters.  Crutches.  Turn on the lights when you go into a dark area. Replace any light bulbs as soon as they burn out.  Set up your furniture so you have a clear path. Avoid moving your furniture around.  If any of your floors are uneven, fix them.  If there are any pets around you, be aware of where they are.  Review your medicines with your doctor. Some medicines can make you feel dizzy. This can increase your chance of falling. Ask your doctor what other things that you can do to help prevent falls. This information is not intended to replace advice given to you by your health care provider. Make sure you discuss any questions you have with your health care provider. Document Released: 04/30/2009 Document Revised: 12/10/2015 Document Reviewed: 08/08/2014 Elsevier Interactive Patient Education  2017 [employer].

## 2019-10-02 NOTE — Progress Notes (Signed)
Subjective:   [name] is a 69 y.o. male who presents for Medicare Annual/Subsequent preventive examination.  Review of Systems: N/A   This visit is being conducted through telemedicine via telephone at the nurse health advisor's home address due to the COVID-19 pandemic. This patient has given me verbal consent via doximity to conduct this visit, patient states they are participating from their home address. Patient and myself are on the telephone call. There is no referral for this visit. Some vital signs may be absent or patient reported.    Patient identification: identified by name, DOB, and current address   Cardiac Risk Factors include: advanced age (>53men, >5 women);hypertension;male gender;dyslipidemia;smoking/ tobacco exposure     Objective:    Vitals: BP (!) 158/68   Wt 200 lb (90.7 kg)   BMI 32.28 kg/m   Body mass index is 32.28 kg/m.  Advanced Directives 10/02/2019 09/12/2018 07/01/2015 06/27/2014 06/27/2014 06/19/2014 12/08/2013  Does Patient Have a Medical Advance Directive? Yes Yes Yes Yes - Yes Patient has advance directive, copy not in chart  Type of Advance Directive [place];Living will [place];Living will [place];Living will [place];Living will [place];Living will [place];Living will Living will;Healthcare Power of Attorney  Does patient want to make changes to medical advance directive? - - No - Patient declined No - Patient declined - - No change requested  Copy of [place] in Chart? No - copy requested No - copy requested No - copy requested No - copy requested - No - copy requested Copy requested from family  Pre-existing out of facility DNR order (yellow form or pink MOST form) - - - - - - No    Tobacco Social History   Tobacco Use  Smoking Status Current Every Day Smoker  . Packs/day: 0.75  . Years:  40.00  . Pack years: 30.00  . Types: Cigarettes  Smokeless Tobacco Never Used  Tobacco Comment   anywhere from .25-1ppd      Ready to quit: Not Answered Counseling given: Not Answered Comment: anywhere from .25-1ppd    Clinical Intake:  Pre-visit preparation completed: Yes  Pain : 0-10 Pain Score: 3  Pain Type: Chronic pain Pain Location: (all over) Pain Descriptors / Indicators: Aching Pain Onset: More than a month ago Pain Frequency: Constant     Nutritional Risks: None Diabetes: No  What is the last grade level you completed in school?: 12th  Interpreter Needed?: No  Information entered by :: CJohnson, LPN  Past Medical History:  Diagnosis Date  . Allergic rhinitis 07/1998  . Aortic atherosclerosis ([place]) 09/2016   by CT  . Arthritis    BACK AND RIGHT KNEE  . CAD (coronary artery disease) 09/2016   RCA, LAD by CT  . COPD (chronic obstructive pulmonary disease) ([place]) 07/1998   centrilobular emphysema (09/2016) w ongoing tobacco use, spirometry (02/2013)  . Dysrhythmia    PVC'S  . ETOH abuse   . Hepatitis    pos test once not now  . HTN (hypertension) 05/1998  . Hyperlipidemia 06/1996  . Lumbar spinal stenosis 03/2013   s/p laminectomy  . 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 Shortness of breath dyspnea    WITH EXERTION  . Smoker    Past Surgical History:  Procedure Laterality  Date  . BACK SURGERY  2007  . CARDIAC CATHETERIZATION N/A 07/01/2015   Procedure: Left Heart Cath and Coronary Angiography;  Surgeon: Wellington Hampshire, MD;  Location: [facility];  Service: Cardiovascular;  Laterality: N/A;  . COLONOSCOPY  09/2010   HPx2, diverticula, hemorrhoids, rpt 10 yrs [place])  . ETT  2010   normal, ABIs normal  . KNEE ARTHROSCOPY Right 11   cartlage, ligaments  . KNEE ARTHROSCOPY Left 84  . LUMBAR LAMINECTOMY/DECOMPRESSION MICRODISCECTOMY Left  03/27/2013   Procedure: LUMBAR LAMINECTOMY/DECOMPRESSION MICRODISCECTOMY 2 LEVELS;  Surgeon: Ophelia Charter, MD;  Location: [facility];  Service: Neurosurgery;  Laterality: Left;  LEFT Lumbar Three Four diskectomy with Lumbar Three-Four, Four-Five Laminectomy. L3/4 ahd L4/5 for LSS [name])   . Neck fusion  2003  . SHOULDER ARTHROSCOPY Right 11  . spriometry  02/2013   COPD with some restriction thought to be obesity related ([place])  . TOTAL KNEE ARTHROPLASTY Right 06/27/2014   Procedure: RIGHT TOTAL KNEE ARTHROPLASTY;  Surgeon: Mcarthur Rossetti, MD;  Location: [facility];  Service: Orthopedics;  Laterality: Right;  . US ECHOCARDIOGRAPHY  01/2013   WNL, mild diastolic dysfunction   Family History  Problem Relation Age of Onset  . Coronary artery disease Mother   . Hypertension Mother   . Stroke Mother   . Coronary artery disease Father   . Hypertension Father   . Diabetes Father   . Stroke Father   . Pneumonia Sister   . Irregular heart beat Brother   . Maple syrup urine disease Brother   . Cancer Neg Hx    Social History   Socioeconomic History  . Marital status: Married    Spouse name: Not on file  . Number of children: Not on file  . Years of education: Not on file  . Highest education level: Not on file  Occupational History  . Not on file  Tobacco Use  . Smoking status: Current Every Day Smoker    Packs/day: 0.75    Years: 40.00    Pack years: 30.00    Types: Cigarettes  . Smokeless tobacco: Never Used  . Tobacco comment: anywhere from .25-1ppd   Substance and Sexual Activity  . Alcohol use: Yes    Alcohol/week: 35.0 - 42.0 standard drinks    Types: 35 - 42 Cans of beer per week    Comment: 5-7 beers/days  . Drug use: No  . Sexual activity: Not Currently  Other Topics Concern  . Not on file  Social History Narrative   Lives with wife. Has 1 son, 2 daughters.    Occupation: Psychologist, clinical.    Activity: no exercise   Diet: good water,  fruits/vegetables daily      Screened positive for OSA at recent hospitalization (03/2013)   Social Determinants of Health   Financial Resource Strain: Low Risk   . Difficulty of Paying Living Expenses: Not hard at all  Food Insecurity: No Food Insecurity  . Worried About Charity fundraiser in the Last Year: Never true  . Ran Out of Food in the Last Year: Never true  Transportation Needs: No Transportation Needs  . Lack of Transportation (Medical): No  . Lack of Transportation (Non-Medical): No  Physical Activity: Inactive  . Days of Exercise per Week: 0 days  . Minutes of Exercise per Session: 0 min  Stress: No Stress Concern Present  . Feeling of Stress : Not at all  Social Connections:   .  Frequency of Communication with Friends and Family:   . Frequency of Social Gatherings with Friends and Family:   . Attends Religious Services:   . Active Member of Clubs or Organizations:   . Attends Archivist Meetings:   [name] Marital Status:     Outpatient Encounter Medications as of 10/02/2019  Medication Sig  . albuterol (PROVENTIL HFA;VENTOLIN HFA) 108 (90 Base) MCG/ACT inhaler INHALE 2 PUFFS INTO THE LUNGS EVERY 6 HOURS AS NEEDED FOR WHEEZING ORSHORTNESS OF BREATH  . Albuterol Sulfate (PROAIR RESPICLICK) 123XX123 (90 Base) MCG/ACT AEPB Inhale 2 puffs into the lungs every 6 (six) hours as needed (cough, shortness of breath).  [name] aspirin EC 81 MG tablet Take 81 mg by mouth daily.  [name] atorvastatin (LIPITOR) 10 MG tablet Take 1 tablet (10 mg total) by mouth daily.  . budesonide-formoterol (SYMBICORT) 80-4.5 MCG/ACT inhaler Inhale 1 puff into the lungs 2 (two) times daily.  . Cholecalciferol (VITAMIN D3) 25 MCG (1000 UT) CAPS Take 1 capsule (1,000 Units total) by mouth daily.  [name] lisinopril (ZESTRIL) 10 MG tablet Take 1 tablet (10 mg total) by mouth daily.  . nicotine (NICOTROL) 10 MG inhaler Inhale 1 Cartridge (1 continuous puffing total) into the lungs as needed for smoking cessation.  [name]  OVER THE COUNTER MEDICATION Take 1 capsule by mouth daily. Young [employer] B  . OVER THE COUNTER MEDICATION Take 1 capsule by mouth daily. [place] [employer] Sulfurzyme (for healthy immune system and to boost liver function  . predniSONE (DELTASONE) 20 MG tablet 2 tabs po for 7 days, then 1 tab po for 7 days  . tiotropium (SPIRIVA) 18 MCG inhalation capsule Place 1 capsule (18 mcg total) into inhaler and inhale daily.  [name] tiZANidine (ZANAFLEX) 4 MG tablet TAKE 1 TABLET(4 MG) BY MOUTH TWICE DAILY AS NEEDED FOR MUSCLE SPASMS   No facility-administered encounter medications on file as of 10/02/2019.    Activities of Daily Living In your present state of health, do you have any difficulty performing the following activities: 10/02/2019  Hearing? N  Vision? N  Difficulty concentrating or making decisions? N  Walking or climbing stairs? N  Dressing or bathing? N  Doing errands, shopping? N  Preparing Food and eating ? N  Using the Toilet? N  In the past six months, have you accidently leaked urine? N  Do you have problems with loss of bowel control? N  Managing your Medications? N  Managing your Finances? N  Housekeeping or managing your Housekeeping? N  Some recent data might be hidden    Patient Care Team: Ria Bush, MD as PCP - General (Family Medicine)   Assessment:   This is a routine wellness examination for Samori.  Exercise Activities and Dietary recommendations Current Exercise Habits: The patient does not participate in regular exercise at present, Exercise limited by: None identified  Goals    . Patient Stated     Starting 09/12/18, I will continue to drink at least 48 oz of water daily.     . Patient Stated     10/02/2019, I will maintain and continue medications as prescribed.        Fall Risk Fall Risk  10/02/2019 09/12/2018 09/11/2017 09/09/2016  Falls in the past year? 0 0 No No  Number falls in past yr: 0 - - -  Injury with Fall? 0 - - -  Risk for fall due to  : Medication side effect - - -  Follow up Falls prevention discussed;Falls  evaluation completed - - -   Is the patient's home free of loose throw rugs in walkways, pet beds, electrical cords, etc?   yes      Grab bars in the bathroom? no      Handrails on the stairs?   yes      Adequate lighting?   yes  Timed Get Up and Go Performed: N/A  Depression Screen PHQ 2/9 Scores 10/02/2019 09/12/2018 09/11/2017 09/09/2016  PHQ - 2 Score 0 0 0 0  PHQ- 9 Score 0 0 - -    Cognitive Function MMSE - Mini Mental State Exam 10/02/2019 09/12/2018  Orientation to time 5 5  Orientation to Place 5 5  Registration 3 3  Attention/ Calculation 5 0  Recall 2 3  Language- name 2 objects - 0  Language- repeat 1 1  Language- follow 3 step command - 2  Language- follow 3 step command-comments - unable to follow 1 step of 3 step command  Language- read & follow direction - 0  Write a sentence - 0  Copy design - 0  Total score - 19  Mini Cog  Mini-Cog screen was completed. Maximum score is 22. A value of 0 denotes this part of the MMSE was not completed or the patient failed this part of the Mini-Cog screening.       Immunization History  Administered Date(s) Administered  . Fluad Quad(high Dose 65+) 04/23/2019  . Influenza Whole 05/18/2005  . Influenza, High Dose Seasonal PF 04/26/2018  . Influenza,inj,Quad PF,6+ Mos 04/16/2014, 04/14/2015, 04/15/2016, 05/05/2017  . PFIZER SARS-COV-2 Vaccination 08/24/2019, 09/17/2019  . Pneumococcal Conjugate-13 09/09/2016  . Pneumococcal Polysaccharide-23 08/26/2013, 09/12/2018  . Td 10/18/2002  . Tdap 08/24/2012  . Zoster 10/03/2013    Qualifies for Shingles Vaccine: Yes  Screening Tests Health Maintenance  Topic Date Due  . DTAP VACCINES (1) 08/30/1951  . DTaP/Tdap/Td (3 - Td) 08/24/2022  . TETANUS/TDAP  08/24/2022  . INFLUENZA VACCINE  Completed  . Hepatitis C Screening  Completed  . PNA vac Low Risk Adult  Completed  . COLONOSCOPY  Discontinued    Cancer Screenings: Lung: Low Dose CT Chest recommended if Age 35-80 years, 30 pack-year currently smoking OR have quit w/in 15 years. Patient does qualify. Completed 01/31/2019 Colorectal: completed 01/04/2011   Additional Screenings:  Hepatitis C Screening:03/07/2016      Plan:    Patient will maintain and continue medications as prescribed.   I have personally reviewed and noted the following in the patient's chart:   . Medical and social history . Use of alcohol, tobacco or illicit drugs  . Current medications and supplements . Functional ability and status . Nutritional status . Physical activity . Advanced directives . List of other physicians . Hospitalizations, surgeries, and ER visits in previous 12 months . Vitals . Screenings to include cognitive, depression, and falls . Referrals and appointments  In addition, I have reviewed and discussed with patient certain preventive protocols, quality metrics, and best practice recommendations. A written personalized care plan for preventive services as well as general preventive health recommendations were provided to patient.     Andrez Grime, LPN  [id number]

## 2019-10-03 ENCOUNTER — Other Ambulatory Visit: Payer: Self-pay

## 2019-10-03 ENCOUNTER — Ambulatory Visit: Payer: Medicare Other

## 2019-10-03 ENCOUNTER — Encounter: Payer: Self-pay | Admitting: Family Medicine

## 2019-10-03 ENCOUNTER — Ambulatory Visit (INDEPENDENT_AMBULATORY_CARE_PROVIDER_SITE_OTHER): Payer: Medicare Other | Admitting: Family Medicine

## 2019-10-03 VITALS — BP 156/70 | HR 73 | Temp 97.8°F | Ht 64.5 in | Wt 204.1 lb

## 2019-10-03 DIAGNOSIS — I10 Essential (primary) hypertension: Secondary | ICD-10-CM

## 2019-10-03 DIAGNOSIS — D539 Nutritional anemia, unspecified: Secondary | ICD-10-CM

## 2019-10-03 DIAGNOSIS — Z7189 Other specified counseling: Secondary | ICD-10-CM

## 2019-10-03 DIAGNOSIS — N400 Enlarged prostate without lower urinary tract symptoms: Secondary | ICD-10-CM | POA: Diagnosis not present

## 2019-10-03 DIAGNOSIS — Z720 Tobacco use: Secondary | ICD-10-CM | POA: Diagnosis not present

## 2019-10-03 DIAGNOSIS — I700 Atherosclerosis of aorta: Secondary | ICD-10-CM

## 2019-10-03 DIAGNOSIS — E669 Obesity, unspecified: Secondary | ICD-10-CM

## 2019-10-03 DIAGNOSIS — J449 Chronic obstructive pulmonary disease, unspecified: Secondary | ICD-10-CM | POA: Diagnosis not present

## 2019-10-03 DIAGNOSIS — R7303 Prediabetes: Secondary | ICD-10-CM | POA: Diagnosis not present

## 2019-10-03 DIAGNOSIS — F1090 Alcohol use, unspecified, uncomplicated: Secondary | ICD-10-CM

## 2019-10-03 DIAGNOSIS — M79651 Pain in right thigh: Secondary | ICD-10-CM

## 2019-10-03 DIAGNOSIS — E785 Hyperlipidemia, unspecified: Secondary | ICD-10-CM

## 2019-10-03 DIAGNOSIS — E559 Vitamin D deficiency, unspecified: Secondary | ICD-10-CM | POA: Diagnosis not present

## 2019-10-03 DIAGNOSIS — I2510 Atherosclerotic heart disease of native coronary artery without angina pectoris: Secondary | ICD-10-CM | POA: Diagnosis not present

## 2019-10-03 DIAGNOSIS — Z7289 Other problems related to lifestyle: Secondary | ICD-10-CM

## 2019-10-03 NOTE — Progress Notes (Signed)
This visit was conducted in person.  BP (!) 156/70 (BP Location: Right Arm, Patient Position: Sitting, Cuff Size: Large)   Pulse 73   Temp 97.8 F (36.6 C) (Temporal)   Ht 5' 4.5" (1.638 m)   Wt 204 lb 2 oz (92.6 kg)   SpO2 95%   BMI 34.50 kg/m   BP Readings from Last 3 Encounters:  10/03/19 (!) 156/70  10/02/19 (!) 158/68  09/11/19 (!) 142/78   CC: AMW f/u visit  Subjective:    Patient ID: [name], male    DOB: 1951-03-14, 69 y.o.   MRN: [id number]  HPI: [name] is a 69 y.o. male presenting on 10/03/2019 for Annual Exam (Prt 2. )   Saw health advisor last week for medicare wellness visit. Note reviewed.   No exam data present    Clinical Support from 10/02/2019 in [place] at [facility] Total Score  0      Fall Risk  10/02/2019 09/12/2018 09/11/2017 09/09/2016  Falls in the past year? 0 0 No No  Number falls in past yr: 0 - - -  Injury with Fall? 0 - - -  Risk for fall due to : Medication side effect - - -  Follow up Falls prevention discussed;Falls evaluation completed - - -    Chronic R thigh pain - saw Dr Lorelei Pont - treated with steroid course and PT to work on strength - improvement with this, but still with R quad atrophy. 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 rec lifelong work on strength. "About 60% better" weight loss may be helping.   Not checking BP at home.  Continues working on weight loss. 20+ lbs down this past year.   Preventative: Colonoscopy 09/23/2010 - small HPs, diverticula, hemorrhoids. Rpt 10 yrs [place]). iFOB normal 2020.  Prostate - normal DRE/PSA in past. Nocturia x3-4. Strong stream. Q33yr.  Lung cancer screening - continues yearly screening CT since 2018.  AAA screen -poor study, no AAA noted (09/2016). Flu shot yearly  Pneumovax -2015, 2020, prevnar2018  Tdap 2014 [place] completed 09/2019 zostavax - 09/2013 shingrix - discussed, interested Advanced directives: has at home. HCPOA is daughter,  [name]. Will bring [place] copy. Seat belt use discussed. Sunscreen use discussed. No changing moles on skin.  Smoking 1/2 ppd. Sparingly uses nicotrol inhaler Alcohol - 5-7 beers/day - this helps his chronic pain  Dentist q6 mo  Eye exam q2 yrs  Bowel - no diarrhea/constipation Bladder - no incontinence  Lives with wife. Has 1 son, 2 daughters.  Occupation: Psychologist, clinical.  Activity: no regular exercise.Treadmill-not regularly using.  Diet:nowater, fruits/vegetables daily      Relevant past medical, surgical, family and social history reviewed and updated as indicated. Interim medical history since our last visit reviewed. Allergies and medications reviewed and updated. Outpatient Medications Prior to Visit  Medication Sig Dispense Refill  . albuterol (PROVENTIL HFA;VENTOLIN HFA) 108 (90 Base) MCG/ACT inhaler INHALE 2 PUFFS INTO THE LUNGS EVERY 6 HOURS AS NEEDED FOR WHEEZING ORSHORTNESS OF BREATH 18 g 3  . Albuterol Sulfate (PROAIR RESPICLICK) 123XX123 (90 Base) MCG/ACT AEPB Inhale 2 puffs into the lungs every 6 (six) hours as needed (cough, shortness of breath). 1 each 3  . aspirin EC 81 MG tablet Take 81 mg by mouth daily.    [name] atorvastatin (LIPITOR) 10 MG tablet Take 1 tablet (10 mg total) by mouth daily. 90 tablet 3  . budesonide-formoterol (SYMBICORT) 80-4.5 MCG/ACT inhaler Inhale 1 puff  into the lungs 2 (two) times daily.    . Cholecalciferol (VITAMIN D3) 25 MCG (1000 UT) CAPS Take 1 capsule (1,000 Units total) by mouth daily. 30 capsule   . lisinopril (ZESTRIL) 10 MG tablet Take 1 tablet (10 mg total) by mouth daily.    . nicotine (NICOTROL) 10 MG inhaler Inhale 1 Cartridge (1 continuous puffing total) into the lungs as needed for smoking cessation. 42 each 1  . OVER THE COUNTER MEDICATION Take 1 capsule by mouth daily. Young [employer] B    . OVER THE COUNTER MEDICATION Take 1 capsule by mouth daily. Young [employer] Sulfurzyme (for healthy immune system and to boost liver  function    . tiotropium (SPIRIVA) 18 MCG inhalation capsule Place 1 capsule (18 mcg total) into inhaler and inhale daily. 30 capsule 11  . predniSONE (DELTASONE) 20 MG tablet 2 tabs po for 7 days, then 1 tab po for 7 days 21 tablet 0  . tiZANidine (ZANAFLEX) 4 MG tablet TAKE 1 TABLET(4 MG) BY MOUTH TWICE DAILY AS NEEDED FOR MUSCLE SPASMS 60 tablet 0   No facility-administered medications prior to visit.     Per HPI unless specifically indicated in ROS section below Review of Systems Objective:    BP (!) 156/70 (BP Location: Right Arm, Patient Position: Sitting, Cuff Size: Large)   Pulse 73   Temp 97.8 F (36.6 C) (Temporal)   Ht 5' 4.5" (1.638 m)   Wt 204 lb 2 oz (92.6 kg)   SpO2 95%   BMI 34.50 kg/m   Wt Readings from Last 3 Encounters:  10/03/19 204 lb 2 oz (92.6 kg)  10/02/19 200 lb (90.7 kg)  09/11/19 204 lb (92.5 kg)    Physical Exam Vitals and nursing note reviewed.  Constitutional:      General: He is not in acute distress.    Appearance: Normal appearance. He is well-developed. He is obese. He is not ill-appearing.  HENT:     Head: Normocephalic and atraumatic.     Right Ear: Hearing, tympanic membrane, ear canal and external ear normal.     Left Ear: Hearing, tympanic membrane, ear canal and external ear normal.     Mouth/Throat:     Pharynx: Uvula midline.  Eyes:     General: No scleral icterus.    Extraocular Movements: Extraocular movements intact.     Conjunctiva/sclera: Conjunctivae normal.     Pupils: Pupils are equal, round, and reactive to light.  Neck:     Thyroid: No thyromegaly or thyroid tenderness.     Vascular: No carotid bruit.  Cardiovascular:     Rate and Rhythm: Normal rate and regular rhythm.     Pulses: Normal pulses.          Radial pulses are 2+ on the right side and 2+ on the left side.     Heart sounds: Normal heart sounds. No murmur.  Pulmonary:     Effort: Pulmonary effort is normal. No respiratory distress.     Breath sounds:  Normal breath sounds. No wheezing, rhonchi or rales.  Abdominal:     General: Abdomen is flat. Bowel sounds are normal. There is no distension.     Palpations: Abdomen is soft. There is no mass.     Tenderness: There is no abdominal tenderness. There is no guarding or rebound.     Hernia: No hernia is present.  Musculoskeletal:        General: Normal range of motion.  Cervical back: Normal range of motion and neck supple.     Right lower leg: No edema.     Left lower leg: No edema.  Lymphadenopathy:     Cervical: No cervical adenopathy.  Skin:    General: Skin is warm and dry.     Findings: No rash.  Neurological:     General: No focal deficit present.     Mental Status: He is alert and oriented to person, place, and time.     Comments: CN grossly intact, station and gait intact  Psychiatric:        Mood and Affect: Mood normal.        Behavior: Behavior normal.        Thought Content: Thought content normal.        Judgment: Judgment normal.       Results for orders placed or performed in visit on 09/30/19  VITAMIN D 25 Hydroxy (Vit-D Deficiency, Fractures)  Result Value Ref Range   VITD 48.18 30.00 - 100.00 ng/mL  CBC with Differential/Platelet  Result Value Ref Range   WBC 5.2 4.0 - 10.5 K/uL   RBC 3.97 (L) 4.22 - 5.81 Mil/uL   Hemoglobin 13.7 13.0 - 17.0 g/dL   HCT 39.8 39.0 - 52.0 %   MCV 100.2 (H) 78.0 - 100.0 fl   MCHC 34.3 30.0 - 36.0 g/dL   RDW 13.0 11.5 - 15.5 %   Platelets 261.0 150.0 - 400.0 K/uL   Neutrophils Relative % 46.3 43.0 - 77.0 %   Lymphocytes Relative 40.6 12.0 - 46.0 %   Monocytes Relative 9.8 3.0 - 12.0 %   Eosinophils Relative 2.9 0.0 - 5.0 %   Basophils Relative 0.4 0.0 - 3.0 %   Neutro Abs 2.4 1.4 - 7.7 K/uL   Lymphs Abs 2.1 0.7 - 4.0 K/uL   Monocytes Absolute 0.5 0.1 - 1.0 K/uL   Eosinophils Absolute 0.1 0.0 - 0.7 K/uL   Basophils Absolute 0.0 0.0 - 0.1 K/uL  PSA  Result Value Ref Range   PSA 1.81 0.10 - 4.00 ng/mL  Hemoglobin A1c   Result Value Ref Range   Hgb A1c MFr Bld 5.8 4.6 - 6.5 %  Lipid panel  Result Value Ref Range   Cholesterol 171 0 - 200 mg/dL   Triglycerides 35.0 0.0 - 149.0 mg/dL   HDL 98.80 >39.00 mg/dL   VLDL 7.0 0.0 - 40.0 mg/dL   LDL Cholesterol 65 0 - 99 mg/dL   Total CHOL/HDL Ratio 2    NonHDL 72.07   Comprehensive metabolic panel  Result Value Ref Range   Sodium 133 (L) 135 - 145 mEq/L   Potassium 4.4 3.5 - 5.1 mEq/L   Chloride 98 96 - 112 mEq/L   CO2 26 19 - 32 mEq/L   Glucose, Bld 134 (H) 70 - 99 mg/dL   BUN 12 6 - 23 mg/dL   Creatinine, Ser 0.88 0.40 - 1.50 mg/dL   Total Bilirubin 0.7 0.2 - 1.2 mg/dL   Alkaline Phosphatase 73 39 - 117 U/L   AST 19 0 - 37 U/L   ALT 25 0 - 53 U/L   Total Protein 6.7 6.0 - 8.3 g/dL   Albumin 4.3 3.5 - 5.2 g/dL   GFR 86.05 >60.00 mL/min   Calcium 9.3 8.4 - 10.5 mg/dL   Assessment & Plan:  This visit occurred during the SARS-CoV-2 public health emergency.  Safety protocols were in place, including screening questions prior to the visit, additional  usage of staff PPE, and extensive cleaning of exam room while observing appropriate contact time as indicated for disinfecting solutions.   Problem List Items Addressed This Visit    Vitamin D deficiency    Continue 1000 IU daily. Levels well replaced.       Tobacco abuse    Continued smoker, contemplative. Has cut down from 1 ppd to 1/2 ppd. Undergoing lung cancer screening CT scans.       Right thigh pain    Appreciate Dr Copland's evaluation. Found to have R thigh weakness and muscle atrophy, now 60% better after PT. Has home exercises he continues.       Prediabetes    Continue to monitor. Encouraged limiting added sugars in diet.       Obesity, Class I, BMI 30-34.9    Congratulated on continued weight loss noted. He is motivated to continue healthy diet and lifestyle efforts for goal sustainable weight loss.      Macrocytic anemia    Improving on B12 replacement.       HLD  (hyperlipidemia)    Chronic, stable on lipitor - continue. The 10-year ASCVD risk score Mikey Bussing DC Brooke Bonito., et al., 2013) is: 36.8%   Values used to calculate the score:     Age: 53 years     Sex: Male     Is Non-Hispanic African American: No     Diabetic: Yes     Tobacco smoker: Yes     Systolic Blood Pressure: A999333 mmHg     Is BP treated: Yes     HDL Cholesterol: 98.8 mg/dL     Total Cholesterol: 171 mg/dL       Habitual alcohol use    Reviewed alcohol use, including possible long term complications of ongoing drinking. Encouraged decreasing use.       Essential hypertension    Elevated last several readings. Reviewed with patient. He will start monitoring closely at home and update me in 2 weeks - discussed increasing lisinopril dose if SBP remaining >150.       COPD (chronic obstructive pulmonary disease) ([place])    Continued smoker. Continue spiriva and symbicort with albuterol PRN. Stable period. Encourage smoking cessation.       CAD (coronary artery disease)    Continue aspirin, statin.       BPH (benign prostatic hyperplasia)    PSA stable. Denies BPH symptoms.      Aortic atherosclerosis (HCC)    Continue aspirin, statin.       Advanced care planning/counseling discussion - Primary    Advanced directives: has at home. HCPOA is daughter, Crystal. Again asked to bring [place] copy.          No orders of the defined types were placed in this encounter.  No orders of the defined types were placed in this encounter.   Patient instructions: Monitor blood pressures at home - let me know if consistently >150 to add blood pressure medicine. Update me with readings in 2 weeks.  If interested, check with pharmacy about new 2 shot shingles series (shingrix).  Bring [place] copy of your living will at your convenience.  Decrease alcohol use.  You are doing well today Return as needed or in 9 months for follow up visit.   Follow up plan: Return in about 6 months (around  04/04/2020) for follow up visit.  Ria Bush, MD

## 2019-10-03 NOTE — Patient Instructions (Addendum)
Monitor blood pressures at home - let me know if consistently >150 to add blood pressure medicine. Update me with readings in 2 weeks.  If interested, check with pharmacy about new 2 shot shingles series (shingrix).  Bring [place] copy of your living will at your convenience.  Decrease alcohol use.  You are doing well today Return as needed or in 9 months for follow up visit.   Health Maintenance After Age 69 After age 72, you are at a higher risk for certain long-term diseases and infections as well as injuries from falls. Falls are a major cause of broken bones and head injuries in people who are older than age 58. Getting regular preventive care can help to keep you healthy and well. Preventive care includes getting regular testing and making lifestyle changes as recommended by your health care provider. Talk with your health care provider about:  Which screenings and tests you should have. A screening is a test that checks for a disease when you have no symptoms.  A diet and exercise plan that is right for you. What should I know about screenings and tests to prevent falls? Screening and testing are the best ways to find a health problem early. Early diagnosis and treatment give you the best chance of managing medical conditions that are common after age 88. Certain conditions and lifestyle choices may make you more likely to have a fall. Your health care provider may recommend:  Regular vision checks. Poor vision and conditions such as cataracts can make you more likely to have a fall. If you wear glasses, make sure to get your prescription updated if your vision changes.  Medicine review. Work with your health care provider to regularly review all of the medicines you are taking, including over-the-counter medicines. Ask your health care provider about any side effects that may make you more likely to have a fall. Tell your health care provider if any medicines that you take make you feel dizzy or  sleepy.  Osteoporosis screening. Osteoporosis is a condition that causes the bones to get weaker. This can make the bones weak and cause them to break more easily.  Blood pressure screening. Blood pressure changes and medicines to control blood pressure can make you feel dizzy.  Strength and balance checks. Your health care provider may recommend certain tests to check your strength and balance while standing, walking, or changing positions.  Foot health exam. Foot pain and numbness, as well as not wearing proper footwear, can make you more likely to have a fall.  Depression screening. You may be more likely to have a fall if you have a fear of falling, feel emotionally low, or feel unable to do activities that you used to do.  Alcohol use screening. Using too much alcohol can affect your balance and may make you more likely to have a fall. What actions can I take to lower my risk of falls? General instructions  Talk with your health care provider about your risks for falling. Tell your health care provider if: ? You fall. Be sure to tell your health care provider about all falls, even ones that seem minor. ? You feel dizzy, sleepy, or off-balance.  Take over-the-counter and prescription medicines only as told by your health care provider. These include any supplements.  Eat a healthy diet and maintain a healthy weight. A healthy diet includes low-fat dairy products, low-fat (lean) meats, and fiber from whole grains, beans, and lots of fruits and vegetables. Home  safety  Remove any tripping hazards, such as rugs, cords, and clutter.  Install safety equipment such as grab bars in bathrooms and safety rails on stairs.  Keep rooms and walkways well-lit. Activity   Follow a regular exercise program to stay fit. This will help you maintain your balance. Ask your health care provider what types of exercise are appropriate for you.  If you need a cane or walker, use it as recommended by  your health care provider.  Wear supportive shoes that have nonskid soles. Lifestyle  Do not drink alcohol if your health care provider tells you not to drink.  If you drink alcohol, limit how much you have: ? 0-1 drink a day for women. ? 0-2 drinks a day for men.  Be aware of how much alcohol is in your drink. In the [place] one drink equals one typical bottle of beer (12 oz), one-half glass of wine (5 oz), or one shot of hard liquor (1 oz).  Do not use any products that contain nicotine or tobacco, such as cigarettes and e-cigarettes. If you need help quitting, ask your health care provider. Summary  Having a healthy lifestyle and getting preventive care can help to protect your health and wellness after age 17.  Screening and testing are the best way to find a health problem early and help you avoid having a fall. Early diagnosis and treatment give you the best chance for managing medical conditions that are more common for people who are older than age 20.  Falls are a major cause of broken bones and head injuries in people who are older than age 12. Take precautions to prevent a fall at home.  Work with your health care provider to learn what changes you can make to improve your health and wellness and to prevent falls. This information is not intended to replace advice given to you by your health care provider. Make sure you discuss any questions you have with your health care provider. Document Revised: 10/25/2018 Document Reviewed: 05/17/2017 Elsevier Patient Education  2020 [employer].

## 2019-10-03 NOTE — Assessment & Plan Note (Signed)
Continue aspirin, statin.  

## 2019-10-03 NOTE — Assessment & Plan Note (Signed)
Continue 1000 IU daily. Levels well replaced.

## 2019-10-03 NOTE — Assessment & Plan Note (Signed)
Elevated last several readings. Reviewed with patient. He will start monitoring closely at home and update me in 2 weeks - discussed increasing lisinopril dose if SBP remaining >150.

## 2019-10-03 NOTE — Assessment & Plan Note (Signed)
Continue to monitor. Encouraged limiting added sugars in diet.

## 2019-10-03 NOTE — Assessment & Plan Note (Signed)
Continued smoker. Continue spiriva and symbicort with albuterol PRN. Stable period. Encourage smoking cessation.

## 2019-10-03 NOTE — Assessment & Plan Note (Addendum)
Appreciate Dr Copland's evaluation. Found to have R thigh weakness and muscle atrophy, now 60% better after PT. Has home exercises he continues.

## 2019-10-03 NOTE — Assessment & Plan Note (Signed)
Improving on B12 replacement.

## 2019-10-03 NOTE — Assessment & Plan Note (Signed)
Chronic, stable on lipitor - continue. The 10-year ASCVD risk score Mikey Bussing DC Brooke Bonito., et al., 2013) is: 36.8%   Values used to calculate the score:     Age: 69 years     Sex: Male     Is Non-Hispanic African American: No     Diabetic: Yes     Tobacco smoker: Yes     Systolic Blood Pressure: [id number] mmHg     Is BP treated: Yes     HDL Cholesterol: 98.8 mg/dL     Total Cholesterol: 171 mg/dL

## 2019-10-03 NOTE — Assessment & Plan Note (Signed)
Reviewed alcohol use, including possible long term complications of ongoing drinking. Encouraged decreasing use.

## 2019-10-03 NOTE — Assessment & Plan Note (Signed)
Continued smoker, contemplative. Has cut down from 1 ppd to 1/2 ppd. Undergoing lung cancer screening CT scans.

## 2019-10-03 NOTE — Assessment & Plan Note (Addendum)
Continue aspirin, statin.  

## 2019-10-03 NOTE — Assessment & Plan Note (Addendum)
PSA stable. Denies BPH symptoms.

## 2019-10-03 NOTE — Assessment & Plan Note (Signed)
Congratulated on continued weight loss noted. He is motivated to continue healthy diet and lifestyle efforts for goal sustainable weight loss.

## 2019-10-03 NOTE — Assessment & Plan Note (Addendum)
Advanced directives: has at home. HCPOA is daughter, Crystal. Again asked to bring [place] copy.

## 2019-11-25 ENCOUNTER — Other Ambulatory Visit: Payer: Self-pay | Admitting: Family Medicine

## 2019-12-04 ENCOUNTER — Ambulatory Visit (INDEPENDENT_AMBULATORY_CARE_PROVIDER_SITE_OTHER): Payer: Medicare Other | Admitting: Orthopaedic Surgery

## 2019-12-04 ENCOUNTER — Encounter: Payer: Self-pay | Admitting: Orthopaedic Surgery

## 2019-12-04 ENCOUNTER — Other Ambulatory Visit: Payer: Self-pay

## 2019-12-04 DIAGNOSIS — M7061 Trochanteric bursitis, right hip: Secondary | ICD-10-CM | POA: Diagnosis not present

## 2019-12-04 MED ORDER — LIDOCAINE HCL 1 % IJ SOLN
3.0000 mL | INTRAMUSCULAR | Status: AC | PRN
  Administered 2019-12-04: 3 mL

## 2019-12-04 MED ORDER — METHYLPREDNISOLONE ACETATE 40 MG/ML IJ SUSP
40.0000 mg | INTRAMUSCULAR | Status: AC | PRN
  Administered 2019-12-04: 40 mg via INTRA_ARTICULAR

## 2019-12-04 NOTE — Progress Notes (Signed)
Office Visit Note   Patient: [name]           Date of Birth: 04-Jul-1951           MRN: [id number] Visit Date: 12/04/2019              Requested by: Ria Bush, MD [address] [place],  [place] [address] PCP: Ria Bush, MD   Assessment & Plan: Visit Diagnoses:  1. Trochanteric bursitis, right hip     Plan: He is shown IT band stretching exercises.  We will see him back in 2 weeks see what type of response he had to the injection and stretching.  May need May need to work up his lumbar spine if pain persist.  Questions were encouraged and answered.  He can call to cancel appointment if he is doing well.  Follow-Up Instructions: Return in about 2 weeks (around 12/18/2019).   Orders:  Orders Placed This Encounter  Procedures  . Large Joint Inj   No orders of the defined types were placed in this encounter.     Procedures: Large Joint Inj: R greater trochanter on 12/04/2019 9:09 AM Indications: pain Details: 22 G 1.5 in needle, lateral approach  Arthrogram: No  Medications: 3 mL lidocaine 1 %; 40 mg methylPREDNISolone acetate 40 MG/ML Outcome: tolerated well, no immediate complications Procedure, treatment alternatives, risks and benefits explained, specific risks discussed. Consent was given by the patient. Immediately prior to procedure a time out was called to verify the correct patient, procedure, equipment, support staff and site/side marked as required. Patient was prepped and draped in the usual sterile fashion.       Clinical Data: No additional findings.   Subjective: Chief Complaint  Patient presents with  . Right Leg - Pain    HPI [name] is known to Dr. Ninfa Linden service comes in today with new complaint of right thigh pain.  States pain is been ongoing for over a year.  No known injury.  He has no groin pain.  Denies any numbness tingling down leg.  Just tightness over the lateral aspect of the right hip.  Initially had  pain in the hip with sleeping on the right side now sleeps in a recliner.  He states that the pain was affecting his gait.  He feels like the leg overall is weak.  He did have round of steroids for 2 weeks states that this helped for about 20%.  Also went to physical therapy for 8 weeks had some dry needling and this helped about 20%.  History of lumbar surgery by Dr. [name] some 15 years ago.  Denies any back pain currently.  History of right total knee arthroplasty by Dr. Ninfa Linden 06/27/2014 no knee pain.  Radiographs are reviewed on canopy AP pelvis lateral view of the right hip shows well preserved right hip joint.  No acute fractures no bony abnormalities.  Right knee multiple views dated 1/21 are reviewed and shows well-seated right total knee arthroplasty components.  No acute fractures.  Knee is well located.  Review of Systems Negative for fevers, chills. Please see HPI.   Objective: Vital Signs: There were no vitals taken for this visit.  Physical Exam Constitutional:      Appearance: He is not ill-appearing or diaphoretic.  Pulmonary:     Effort: Pulmonary effort is normal.  Neurological:     Mental Status: He is alert and oriented to person, place, and time.  Psychiatric:  Mood and Affect: Mood normal.     Ortho Exam Lower extremities 5 and 5 strength throughout against resistance.  Negative straight leg raise but tight hamstring on the right.  He has near full forward flexion of the lumbar spine without pain good extension without pain.  No tenderness in the paraspinous region of the lumbar spine or over the spinal column lumbar spine.  Sensation intact bilateral feet.  Dorsal pedal pulses are intact bilaterally.  Good range of motion of [name] hips internal rotation of the right hip does cause pain lateral aspect of the hip.  He has tenderness over [name] trochanteric regions but down the right IT band he is also tender to palpation.  Right knee excellent range of motion without  pain.  No instability with valgus varus stressing right knee.  No abnormal warmth erythema of the right knee well-healed surgical incision right knee. Specialty Comments:  No specialty comments available.  Imaging: No results found.   PMFS History: Patient Active Problem List   Diagnosis Date Noted  . Macrocytic anemia 06/20/2019  . Right thigh pain 04/24/2019  . Kidney cyst, acquired 12/12/2017  . Personal history of tobacco use, presenting hazards to health 09/27/2016  . Aortic atherosclerosis ([place]) 09/15/2016  . CAD (coronary artery disease) 09/15/2016  . Tinea pedis of [name] feet 03/07/2016  . Tobacco abuse 09/30/2015  . Advanced care planning/counseling discussion 09/03/2014  . Primary osteoarthritis of right knee 06/27/2014  . Status post total right knee replacement 06/27/2014  . Habitual alcohol use 12/08/2013  . Initial Medicare annual wellness visit 08/25/2011  . Vitamin D deficiency 08/26/2010  . Obesity, Class I, BMI 30-34.9 08/26/2010  . BPH (benign prostatic hyperplasia) 08/26/2010  . Onychomycosis 02/08/2010  . PREMATURE VENTRICULAR CONTRACTIONS 11/17/2009  . PVD 11/06/2008  . Snoring 11/06/2008  . HLD (hyperlipidemia) 07/31/2007  . Essential hypertension 07/31/2007  . Allergic rhinitis 07/31/2007  . COPD (chronic obstructive pulmonary disease) ([place]) 07/31/2007  . 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 Prediabetes 07/31/2007  . History of hepatitis C 07/31/2007   Past Medical History:  Diagnosis Date  . Allergic rhinitis 07/1998  . Aortic atherosclerosis ([place]) 09/2016   by CT  . Arthritis    BACK AND RIGHT KNEE  . CAD (coronary artery disease) 09/2016   RCA, LAD by CT  . COPD (chronic obstructive pulmonary disease) ([place]) 07/1998   centrilobular emphysema (09/2016) w ongoing tobacco use, spirometry (02/2013)  . Dysrhythmia    PVC'S  . ETOH abuse   . Hepatitis    pos test once not now  . HTN (hypertension) 05/1998  . Hyperlipidemia  06/1996  . Lumbar spinal stenosis 03/2013   s/p laminectomy  . 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 Shortness of breath dyspnea    WITH EXERTION  . Smoker     Family History  Problem Relation Age of Onset  . Coronary artery disease Mother   . Hypertension Mother   . Stroke Mother   . Coronary artery disease Father   . Hypertension Father   . Diabetes Father   . Stroke Father   . Pneumonia Sister   . Irregular heart beat Brother   . Maple syrup urine disease Brother   . Cancer Neg Hx     Past Surgical History:  Procedure Laterality Date  . BACK SURGERY  2007  . CARDIAC  CATHETERIZATION N/A 07/01/2015   Procedure: Left Heart Cath and Coronary Angiography;  Surgeon: Wellington Hampshire, MD;  Location: [facility];  Service: Cardiovascular;  Laterality: N/A;  . COLONOSCOPY  09/2010   HPx2, diverticula, hemorrhoids, rpt 10 yrs [place])  . ETT  2010   normal, ABIs normal  . KNEE ARTHROSCOPY Right 11   cartlage, ligaments  . KNEE ARTHROSCOPY Left 84  . LUMBAR LAMINECTOMY/DECOMPRESSION MICRODISCECTOMY Left 03/27/2013   Procedure: LUMBAR LAMINECTOMY/DECOMPRESSION MICRODISCECTOMY 2 LEVELS;  Surgeon: Ophelia Charter, MD;  Location: [facility];  Service: Neurosurgery;  Laterality: Left;  LEFT Lumbar Three Four diskectomy with Lumbar Three-Four, Four-Five Laminectomy. L3/4 ahd L4/5 for LSS [name])   . Neck fusion  2003  . SHOULDER ARTHROSCOPY Right 11  . spriometry  02/2013   COPD with some restriction thought to be obesity related ([place])  . TOTAL KNEE ARTHROPLASTY Right 06/27/2014   Procedure: RIGHT TOTAL KNEE ARTHROPLASTY;  Surgeon: Mcarthur Rossetti, MD;  Location: [facility];  Service: Orthopedics;  Laterality: Right;  . US ECHOCARDIOGRAPHY  01/2013   WNL, mild diastolic dysfunction   Social History   Occupational History  . Not on file  Tobacco Use  . Smoking  status: Current Every Day Smoker    Packs/day: 0.75    Years: 40.00    Pack years: 30.00    Types: Cigarettes  . Smokeless tobacco: Never Used  . Tobacco comment: anywhere from .25-1ppd   Substance and Sexual Activity  . Alcohol use: Yes    Alcohol/week: 35.0 - 42.0 standard drinks    Types: 35 - 42 Cans of beer per week    Comment: 5-7 beers/days  . Drug use: No  . Sexual activity: Not Currently

## 2019-12-18 ENCOUNTER — Ambulatory Visit (INDEPENDENT_AMBULATORY_CARE_PROVIDER_SITE_OTHER): Payer: Medicare Other | Admitting: Orthopaedic Surgery

## 2019-12-18 ENCOUNTER — Other Ambulatory Visit: Payer: Self-pay

## 2019-12-18 ENCOUNTER — Encounter: Payer: Self-pay | Admitting: Orthopaedic Surgery

## 2019-12-18 DIAGNOSIS — M7631 Iliotibial band syndrome, right leg: Secondary | ICD-10-CM

## 2019-12-18 DIAGNOSIS — I2510 Atherosclerotic heart disease of native coronary artery without angina pectoris: Secondary | ICD-10-CM

## 2019-12-18 DIAGNOSIS — M7061 Trochanteric bursitis, right hip: Secondary | ICD-10-CM

## 2019-12-18 NOTE — Progress Notes (Signed)
The patient comes in today with continued follow-up for right trochanteric bursitis and IT band syndrome.  He had a steroid injection over the trochanteric area the last visit about 2 weeks ago.  He has been working on stretching exercises well.  He has made progress.  Now his pain is dissipated at the proximal hip area on the right side.  He has more pain in the mid and distal.  He denies any groin pain.  He has a history of a right total knee arthroplasty and that is doing well.  On exam his right hip and right knee move well.  He has some pain to palpation of the trochanteric area but most his pain is over the IT band at the mid to distal thigh area.  I did feel it was worthwhile trying a steroid injection around the IT band in this area where his most tender.  He is now diabetic and he agreed to this.  Of also recommended Voltaren gel to try several times a day.  I gave him instructions about this as well.  I did explain the risk and benefits of steroid injections.  He did tolerate it well.  All questions and concerns were answered and addressed.  At this point follow-up will be as needed.

## 2020-01-20 ENCOUNTER — Telehealth: Payer: Self-pay | Admitting: Family Medicine

## 2020-01-20 DIAGNOSIS — M5416 Radiculopathy, lumbar region: Secondary | ICD-10-CM

## 2020-01-20 DIAGNOSIS — M79651 Pain in right thigh: Secondary | ICD-10-CM

## 2020-01-20 NOTE — Telephone Encounter (Signed)
plz call - I received note from [facility] about ongoing R hip pain - has seen Sports Med, Ortho, s/p oral prednisone as well as PT and steroid injections.  How is he doing? Is it same lateral hip pain as prior or is it now more lumbar radiculopathy pain - back pain then shooting down leg?

## 2020-01-21 NOTE — Telephone Encounter (Signed)
Spoke with pt asking about right hip pain and relaying Dr. Synthia Innocent message.  Pt states he's had 3 back surgeries.  Confirms the pain in now in lower back and does shoot down his leg.

## 2020-01-23 ENCOUNTER — Telehealth: Payer: Self-pay | Admitting: Radiology

## 2020-01-23 NOTE — Telephone Encounter (Signed)
Left voicemail that we received letter from [facility] that patient is still having some significant pain. Advised Dr. Ninfa Linden would need patient to come in for an appointment for evaluation for any further recommendations or treatment.

## 2020-01-27 ENCOUNTER — Telehealth: Payer: Self-pay

## 2020-01-27 DIAGNOSIS — Z87891 Personal history of nicotine dependence: Secondary | ICD-10-CM

## 2020-01-27 DIAGNOSIS — Z122 Encounter for screening for malignant neoplasm of respiratory organs: Secondary | ICD-10-CM

## 2020-01-27 NOTE — Telephone Encounter (Signed)
Patient has been notified that the low dose lung cancer screening CT scan is due currently or will be in near future.  Confirmed that patient is within the appropriate age range and asymptomatic, (no signs or symptoms of lung cancer).  Patient denies illness that would prevent curative treatment for lung cancer if found.  Patient is agreeable for CT scan being scheduled.    Verified smoking history (current smoker, with 40 year 0.5 ppd history).   CT scheduled for 02/06/20 @ 10:15

## 2020-01-28 NOTE — Addendum Note (Signed)
Addended by: Lieutenant Diego on: 01/28/2020 11:20 AM   Modules accepted: Orders

## 2020-01-28 NOTE — Telephone Encounter (Signed)
Smoking history: current, 32.25 pack year

## 2020-01-30 NOTE — Telephone Encounter (Signed)
Lvm asking pt to call back.  Need to relay Dr. G's message.  

## 2020-01-30 NOTE — Telephone Encounter (Signed)
Has he had a steroid course recently? Would offer prednisone taper for back.  Would also offer appt with me or Dr Lorelei Pont or ortho for further evaluation of ongoing pain.

## 2020-01-30 NOTE — Telephone Encounter (Signed)
Patient contacted the office back. I advised him of Dr. Synthia Innocent comments. Patient became very upset and states he feels like he has seen every doctor and tried every medication but he is not having any relief from his pain, and he would like an MRI to try to see if that explains what is causing his pain. Dr. Darnell Level, please advise.

## 2020-01-31 NOTE — Telephone Encounter (Addendum)
Tried to call patient, no answer, will try later.

## 2020-02-03 NOTE — Telephone Encounter (Signed)
Called back, left message  

## 2020-02-03 NOTE — Telephone Encounter (Signed)
Patient returned Dr.G's call.  Patient can be reached at [phone].

## 2020-02-04 ENCOUNTER — Other Ambulatory Visit: Payer: Self-pay | Admitting: Radiology

## 2020-02-04 ENCOUNTER — Other Ambulatory Visit: Payer: Self-pay

## 2020-02-04 ENCOUNTER — Encounter: Payer: Self-pay | Admitting: Orthopaedic Surgery

## 2020-02-04 ENCOUNTER — Ambulatory Visit (INDEPENDENT_AMBULATORY_CARE_PROVIDER_SITE_OTHER): Payer: Medicare Other | Admitting: Orthopaedic Surgery

## 2020-02-04 VITALS — Ht 67.0 in | Wt 195.0 lb

## 2020-02-04 DIAGNOSIS — M7631 Iliotibial band syndrome, right leg: Secondary | ICD-10-CM

## 2020-02-04 DIAGNOSIS — M7061 Trochanteric bursitis, right hip: Secondary | ICD-10-CM | POA: Diagnosis not present

## 2020-02-04 DIAGNOSIS — I2510 Atherosclerotic heart disease of native coronary artery without angina pectoris: Secondary | ICD-10-CM | POA: Diagnosis not present

## 2020-02-04 NOTE — Progress Notes (Signed)
The patient comes in with continued right hip pain in spite of 2 injections and conservative treatment over the trochanteric area.  This includes physical therapy and anti-inflammatories.  He still has severe pain around his right hip and the proximal IT band and trochanteric area.  On exam his right hip moves fluidly with internal ex rotation.  He has severe pain of the trochanteric area and IT band.  His plain films showed normal-appearing hip.  This point given the failure of 2 injections combined with anti-inflammatories, activity modification, ice, heat and formal physical therapy, a MRI of the right hip is warranted to assess all the structures around the greater trochanter on the right side.  We will see him back once we have this MRI.  All questions and concerns were answered and addressed.

## 2020-02-04 NOTE — Telephone Encounter (Signed)
Called again, unable to reach.

## 2020-02-05 MED ORDER — PREDNISONE 20 MG PO TABS
ORAL_TABLET | ORAL | 0 refills | Status: DC
Start: 2020-02-05 — End: 2020-03-30

## 2020-02-05 NOTE — Telephone Encounter (Signed)
MRI 's have been scheduled for 02/21/20 at 2:30pm at [facility].

## 2020-02-05 NOTE — Addendum Note (Signed)
Addended by: Ria Bush on: 02/05/2020 02:28 PM   Modules accepted: Orders

## 2020-02-05 NOTE — Telephone Encounter (Addendum)
Spoke with and examined patient who walked in today.  Progressive R thigh pain - sharp knife, now with new R lower back/buttock pain and progressive weakness of right leg - needs to help raise R leg with hand to get on exam table. On exam, he has brisk 3+ R patellar reflex, only 1+ on L. He has pain with seated SLR on right. He has some drop foot weakness on right affecting gait.  I will Rx prednisone course and try to add lumbar MR to his upcoming hip MRI.  He has upcoming appt with Dr [name] next week.  Symptoms ongoing since 02/2019, progressively worsening, tried and failed conservative management with meds through sports med, chiropractor, formal PT and now ortho.

## 2020-02-05 NOTE — Telephone Encounter (Signed)
[name] - Client Nonclinical Telephone Record AccessNurse Client [name] - Client Client Site [name] Contact Type Call Who Is Calling Patient / Member / Family / Caregiver Caller Name ED [name] Phone Number [phone] Call Type Message Only Information Provided Reason for Call Returning a Call from the Office Initial Comment Returning call to office. Disp. Time Disposition Final User 02/04/2020 5:12:08 PM General Information Provided Yes Scharlene Corn Call Closed By: Scharlene Corn Transaction Date/Time: 02/04/2020 5:11:06 PM (ET)

## 2020-02-05 NOTE — Addendum Note (Signed)
Addended by: Ria Bush on: 02/05/2020 09:10 AM   Modules accepted: Orders

## 2020-02-05 NOTE — Telephone Encounter (Signed)
Called again, unable to reach.

## 2020-02-06 ENCOUNTER — Other Ambulatory Visit: Payer: Self-pay

## 2020-02-06 ENCOUNTER — Ambulatory Visit
Admission: RE | Admit: 2020-02-06 | Discharge: 2020-02-06 | Disposition: A | Discharge status: Home / Self Care | Payer: Medicare Other | Source: Ambulatory Visit | Attending: Oncology | Admitting: Oncology

## 2020-02-06 DIAGNOSIS — Z87891 Personal history of nicotine dependence: Secondary | ICD-10-CM | POA: Diagnosis not present

## 2020-02-06 DIAGNOSIS — Z122 Encounter for screening for malignant neoplasm of respiratory organs: Secondary | ICD-10-CM | POA: Diagnosis not present

## 2020-02-06 DIAGNOSIS — F17210 Nicotine dependence, cigarettes, uncomplicated: Secondary | ICD-10-CM | POA: Diagnosis not present

## 2020-02-07 ENCOUNTER — Other Ambulatory Visit (INDEPENDENT_AMBULATORY_CARE_PROVIDER_SITE_OTHER): Payer: Medicare Other

## 2020-02-07 DIAGNOSIS — M5416 Radiculopathy, lumbar region: Secondary | ICD-10-CM | POA: Diagnosis not present

## 2020-02-07 DIAGNOSIS — M79651 Pain in right thigh: Secondary | ICD-10-CM | POA: Diagnosis not present

## 2020-02-07 LAB — BASIC METABOLIC PANEL
BUN: 14 mg/dL (ref 6–23)
CO2: 28 mEq/L (ref 19–32)
Calcium: 9.6 mg/dL (ref 8.4–10.5)
Chloride: 99 mEq/L (ref 96–112)
Creatinine, Ser: 0.92 mg/dL (ref 0.40–1.50)
GFR: 81.67 mL/min (ref >60.00)
Glucose, Bld: 118 mg/dL — ABNORMAL HIGH (ref 70–99)
Potassium: 4.6 mEq/L (ref 3.5–5.1)
Sodium: 133 mEq/L — ABNORMAL LOW (ref 135–145)

## 2020-02-10 ENCOUNTER — Encounter: Payer: Self-pay | Admitting: *Deleted

## 2020-02-11 ENCOUNTER — Other Ambulatory Visit: Payer: Self-pay | Admitting: Neurosurgery

## 2020-02-11 DIAGNOSIS — M5441 Lumbago with sciatica, right side: Secondary | ICD-10-CM | POA: Diagnosis not present

## 2020-02-11 DIAGNOSIS — G8929 Other chronic pain: Secondary | ICD-10-CM | POA: Diagnosis not present

## 2020-02-11 DIAGNOSIS — M542 Cervicalgia: Secondary | ICD-10-CM

## 2020-02-18 ENCOUNTER — Ambulatory Visit: Payer: Medicare Other | Admitting: Orthopaedic Surgery

## 2020-02-21 ENCOUNTER — Ambulatory Visit: Admission: RE | Admit: 2020-02-21 | Payer: Medicare Other | Source: Ambulatory Visit

## 2020-02-21 ENCOUNTER — Other Ambulatory Visit: Payer: Self-pay

## 2020-02-21 ENCOUNTER — Ambulatory Visit
Admission: RE | Admit: 2020-02-21 | Discharge: 2020-02-21 | Disposition: A | Discharge status: Home / Self Care | Payer: Medicare Other | Source: Ambulatory Visit | Attending: Orthopaedic Surgery | Admitting: Orthopaedic Surgery

## 2020-02-21 ENCOUNTER — Ambulatory Visit
Admission: RE | Admit: 2020-02-21 | Discharge: 2020-02-21 | Disposition: A | Discharge status: Home / Self Care | Payer: Medicare Other | Source: Ambulatory Visit | Attending: Neurosurgery | Admitting: Neurosurgery

## 2020-02-21 DIAGNOSIS — M79651 Pain in right thigh: Secondary | ICD-10-CM | POA: Insufficient documentation

## 2020-02-21 DIAGNOSIS — M47812 Spondylosis without myelopathy or radiculopathy, cervical region: Secondary | ICD-10-CM | POA: Diagnosis not present

## 2020-02-21 DIAGNOSIS — M5023 Other cervical disc displacement, cervicothoracic region: Secondary | ICD-10-CM | POA: Diagnosis not present

## 2020-02-21 DIAGNOSIS — M7061 Trochanteric bursitis, right hip: Secondary | ICD-10-CM | POA: Diagnosis not present

## 2020-02-21 DIAGNOSIS — M542 Cervicalgia: Secondary | ICD-10-CM | POA: Insufficient documentation

## 2020-02-21 DIAGNOSIS — M5416 Radiculopathy, lumbar region: Secondary | ICD-10-CM | POA: Insufficient documentation

## 2020-02-21 DIAGNOSIS — M4726 Other spondylosis with radiculopathy, lumbar region: Secondary | ICD-10-CM | POA: Diagnosis not present

## 2020-02-21 DIAGNOSIS — M4802 Spinal stenosis, cervical region: Secondary | ICD-10-CM | POA: Diagnosis not present

## 2020-02-21 DIAGNOSIS — M5115 Intervertebral disc disorders with radiculopathy, thoracolumbar region: Secondary | ICD-10-CM | POA: Diagnosis not present

## 2020-02-21 DIAGNOSIS — M1611 Unilateral primary osteoarthritis, right hip: Secondary | ICD-10-CM | POA: Diagnosis not present

## 2020-02-21 DIAGNOSIS — M5117 Intervertebral disc disorders with radiculopathy, lumbosacral region: Secondary | ICD-10-CM | POA: Diagnosis not present

## 2020-02-21 DIAGNOSIS — M7631 Iliotibial band syndrome, right leg: Secondary | ICD-10-CM | POA: Diagnosis not present

## 2020-02-21 DIAGNOSIS — M5116 Intervertebral disc disorders with radiculopathy, lumbar region: Secondary | ICD-10-CM | POA: Diagnosis not present

## 2020-02-21 DIAGNOSIS — M4803 Spinal stenosis, cervicothoracic region: Secondary | ICD-10-CM | POA: Diagnosis not present

## 2020-02-21 DIAGNOSIS — M25451 Effusion, right hip: Secondary | ICD-10-CM | POA: Diagnosis not present

## 2020-02-21 DIAGNOSIS — M533 Sacrococcygeal disorders, not elsewhere classified: Secondary | ICD-10-CM | POA: Diagnosis not present

## 2020-02-21 MED ORDER — GADOBUTROL 1 MMOL/ML IV SOLN
9.0000 mL | Freq: Once | INTRAVENOUS | Status: AC | PRN
  Administered 2020-02-21: 9 mL via INTRAVENOUS

## 2020-02-24 ENCOUNTER — Ambulatory Visit (INDEPENDENT_AMBULATORY_CARE_PROVIDER_SITE_OTHER): Payer: Medicare Other | Admitting: Orthopaedic Surgery

## 2020-02-24 ENCOUNTER — Encounter: Payer: Self-pay | Admitting: Orthopaedic Surgery

## 2020-02-24 DIAGNOSIS — I2510 Atherosclerotic heart disease of native coronary artery without angina pectoris: Secondary | ICD-10-CM

## 2020-02-24 DIAGNOSIS — M7061 Trochanteric bursitis, right hip: Secondary | ICD-10-CM

## 2020-02-24 NOTE — Progress Notes (Signed)
[name] comes in today to go over an MRI of his right hip.  We had injected this area several times and it did not help in terms of trochanteric bursitis and the pain he was having.  His plain films did not show any significant disease in his hip.  He is also followed by Dr. [name] for cervical spine and his lumbar spine.  He has had recent MRIs of his right hip but also his cervical spine and his lumbar spine.  From a hip standpoint, I can put his right hip easily through internal and external rotation.  There is some deep pain in the thigh itself but no pain over the trochanteric area of the hip itself.  The MRI of his right hip does show some mild arthritic changes.  There is no fluid collections around the trochanteric area.  There is some edema in the vastus lateralis muscle suggesting a strain and this may be how he walks because when I had him walk he has such a Trendelenburg gait and alert to the side.  He does have a history of a right knee replacement.  There is also some hamstring tendinitis noted.  From my standpoint, this may be more of a spine issue than a hip issue.  I do not see any other recommendations I can have for his hip including injections or surgery on the right side given the MRI findings and his clinical exam findings.  I went over this with him in detail and he agrees.  We will follow up with Dr. [name] for his cervical and lumbar spine.

## 2020-02-25 DIAGNOSIS — M4802 Spinal stenosis, cervical region: Secondary | ICD-10-CM | POA: Diagnosis not present

## 2020-02-28 ENCOUNTER — Other Ambulatory Visit: Payer: Self-pay | Admitting: Neurosurgery

## 2020-03-15 ENCOUNTER — Encounter: Payer: Self-pay | Admitting: Family Medicine

## 2020-03-17 LAB — HEPATIC FUNCTION PANEL
ALT: 29 (ref 10–40)
AST: 13 — AB (ref 14–40)
Alkaline Phosphatase: 92 (ref 25–125)
Bilirubin, Total: 0.7

## 2020-03-17 LAB — CBC AND DIFFERENTIAL
Hemoglobin: 14.3 (ref 13.5–17.5)
Platelets: 224 (ref 150–399)
WBC: 7

## 2020-03-17 LAB — BASIC METABOLIC PANEL
Creatinine: 0.8 (ref 0.6–1.3)
Glucose: 110
Potassium: 3.7 (ref 3.4–5.3)
Sodium: 132 — AB (ref 137–147)

## 2020-03-17 LAB — LIPID PANEL
Cholesterol: 174 (ref 0–200)
HDL: 121 — AB (ref 35–70)
LDL Cholesterol: 47

## 2020-03-17 LAB — PSA: PSA: 2.26

## 2020-03-17 LAB — COMPREHENSIVE METABOLIC PANEL: Albumin: 4.4 (ref 3.5–5.0)

## 2020-03-17 LAB — HEMOGLOBIN A1C: Hemoglobin A1C: 5.8

## 2020-03-24 ENCOUNTER — Other Ambulatory Visit: Payer: Self-pay | Admitting: Neurosurgery

## 2020-03-26 ENCOUNTER — Other Ambulatory Visit: Payer: Self-pay | Admitting: Neurosurgery

## 2020-03-27 ENCOUNTER — Encounter: Payer: Self-pay | Admitting: Family Medicine

## 2020-03-27 ENCOUNTER — Ambulatory Visit (INDEPENDENT_AMBULATORY_CARE_PROVIDER_SITE_OTHER)
Admission: RE | Admit: 2020-03-27 | Discharge: 2020-03-27 | Disposition: A | Discharge status: Home / Self Care | Payer: Medicare Other | Source: Ambulatory Visit | Attending: Family Medicine | Admitting: Family Medicine

## 2020-03-27 ENCOUNTER — Ambulatory Visit (INDEPENDENT_AMBULATORY_CARE_PROVIDER_SITE_OTHER): Payer: Medicare Other | Admitting: Family Medicine

## 2020-03-27 ENCOUNTER — Encounter (HOSPITAL_COMMUNITY): Payer: Self-pay | Admitting: Neurosurgery

## 2020-03-27 ENCOUNTER — Other Ambulatory Visit
Admission: RE | Admit: 2020-03-27 | Discharge: 2020-03-27 | Disposition: A | Discharge status: Home / Self Care | Payer: Medicare Other | Source: Ambulatory Visit | Attending: Neurosurgery | Admitting: Neurosurgery

## 2020-03-27 ENCOUNTER — Other Ambulatory Visit: Payer: Self-pay

## 2020-03-27 ENCOUNTER — Encounter (HOSPITAL_COMMUNITY): Payer: Self-pay | Admitting: Physician Assistant

## 2020-03-27 VITALS — BP 138/78 | HR 73 | Temp 97.8°F | Ht 67.0 in | Wt 194.2 lb

## 2020-03-27 DIAGNOSIS — Z20822 Contact with and (suspected) exposure to covid-19: Secondary | ICD-10-CM | POA: Insufficient documentation

## 2020-03-27 DIAGNOSIS — I10 Essential (primary) hypertension: Secondary | ICD-10-CM | POA: Diagnosis not present

## 2020-03-27 DIAGNOSIS — R7303 Prediabetes: Secondary | ICD-10-CM

## 2020-03-27 DIAGNOSIS — M5030 Other cervical disc degeneration, unspecified cervical region: Secondary | ICD-10-CM | POA: Diagnosis not present

## 2020-03-27 DIAGNOSIS — Z7289 Other problems related to lifestyle: Secondary | ICD-10-CM

## 2020-03-27 DIAGNOSIS — M5416 Radiculopathy, lumbar region: Secondary | ICD-10-CM | POA: Diagnosis not present

## 2020-03-27 DIAGNOSIS — I2510 Atherosclerotic heart disease of native coronary artery without angina pectoris: Secondary | ICD-10-CM

## 2020-03-27 DIAGNOSIS — Z720 Tobacco use: Secondary | ICD-10-CM

## 2020-03-27 DIAGNOSIS — E669 Obesity, unspecified: Secondary | ICD-10-CM

## 2020-03-27 DIAGNOSIS — Z01812 Encounter for preprocedural laboratory examination: Secondary | ICD-10-CM | POA: Insufficient documentation

## 2020-03-27 DIAGNOSIS — J449 Chronic obstructive pulmonary disease, unspecified: Secondary | ICD-10-CM | POA: Diagnosis not present

## 2020-03-27 DIAGNOSIS — Z01818 Encounter for other preprocedural examination: Secondary | ICD-10-CM

## 2020-03-27 DIAGNOSIS — I4819 Other persistent atrial fibrillation: Secondary | ICD-10-CM | POA: Insufficient documentation

## 2020-03-27 DIAGNOSIS — F1090 Alcohol use, unspecified, uncomplicated: Secondary | ICD-10-CM

## 2020-03-27 DIAGNOSIS — I4891 Unspecified atrial fibrillation: Secondary | ICD-10-CM

## 2020-03-27 MED ORDER — METOPROLOL TARTRATE 25 MG PO TABS
25.0000 mg | ORAL_TABLET | Freq: Two times a day (BID) | ORAL | 3 refills | Status: DC
Start: 2020-03-27 — End: 2020-03-30

## 2020-03-27 MED ORDER — APIXABAN 5 MG PO TABS
5.0000 mg | ORAL_TABLET | Freq: Two times a day (BID) | ORAL | 3 refills | Status: DC
Start: 2020-03-27 — End: 2020-03-30

## 2020-03-27 NOTE — Assessment & Plan Note (Signed)
RCRI = 0.  However with new afib, will refer to cardiology for cardiac clearance prior to surgery.

## 2020-03-27 NOTE — Assessment & Plan Note (Signed)
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 Needs C1/2 and C2/3 laminectomy and fusion [name]). With new afib, will urgently refer to cards for further eval prior to surgery.

## 2020-03-27 NOTE — Progress Notes (Signed)
This visit was conducted in person.  BP 138/78 (BP Location: Left Arm, Patient Position: Sitting, Cuff Size: Normal)   Pulse 73   Temp 97.8 F (36.6 C) (Temporal)   Ht 5\' 7"  (1.702 m)   Wt 194 lb 4 oz (88.1 kg)   SpO2 97%   BMI 30.42 kg/m    CC: preop eval Subjective:    Patient ID: [name], male    DOB: [birth year], 69 y.o.   MRN: [id number]  HPI: [name] is a 69 y.o. male presenting on 03/27/2020 for Pre-op Exam (Cervical fusion scheduled for 03/30/20. )   Upcoming cervical fusion surgery for [name]) MRI confirmed severe spinal stenosis at C1/2 with evidence of spinal cord change (myelopathy) planned C1/2 and C2/3 laminectomy with flusion to decompress spine - needs this rather urgently. H/o prior C3/4, 4/5, 5/6, 6/7 ACDF with plating.   Has previously tolerated GETA well. No prior reaction to anesthesia, postop nausea/vomiting or difficulty awakening after surgery.  Last surgery was R knee replacement 2014 - needed postop foley temporarily.   Known CAD s/p cardiac catheterization 06/2015 [facility]).  Continues aspirin, lipitor, lisinopril.   Denies chest pain, tightness, dyspnea, palpitations, or lightheadedness.  Brings [facility] labs from last week which will be scanned.      Relevant past medical, surgical, family and social history reviewed and updated as indicated. Interim medical history since our last visit reviewed. Allergies and medications reviewed and updated. Outpatient Medications Prior to Visit  Medication Sig Dispense Refill  . albuterol (PROVENTIL HFA;VENTOLIN HFA) 108 (90 Base) MCG/ACT inhaler INHALE 2 PUFFS INTO THE LUNGS EVERY 6 HOURS AS NEEDED FOR WHEEZING ORSHORTNESS OF BREATH (Patient taking differently: Inhale 2 puffs into the lungs every 6 (six) hours as needed for wheezing or shortness of breath. ) 18 g 3  . Albuterol Sulfate (PROAIR RESPICLICK) 735 (90 Base) MCG/ACT AEPB Inhale 2 puffs into the lungs every 6 (six) hours as needed (cough,  shortness of breath). 1 each 3  . aspirin EC 81 MG tablet Take 81 mg by mouth daily.    [name] atorvastatin (LIPITOR) 10 MG tablet TAKE 1 TABLET(10 MG) BY MOUTH DAILY (Patient taking differently: Take 10 mg by mouth daily. ) 90 tablet 3  . budesonide-formoterol (SYMBICORT) 80-4.5 MCG/ACT inhaler Inhale 1 puff into the lungs 2 (two) times daily.    . Cholecalciferol (VITAMIN D3) 25 MCG (1000 UT) CAPS Take 1 capsule (1,000 Units total) by mouth daily. 30 capsule   . lisinopril (ZESTRIL) 10 MG tablet TAKE 1 TABLET BY MOUTH EVERY DAY (Patient taking differently: Take 10 mg by mouth daily. ) 90 tablet 3  . nicotine (NICOTROL) 10 MG inhaler Inhale 1 Cartridge (1 continuous puffing total) into the lungs as needed for smoking cessation. 42 each 1  . OVER THE COUNTER MEDICATION Take 1 capsule by mouth daily. [employer] B     . OVER THE COUNTER MEDICATION Take 1 capsule by mouth daily. [employer] (for healthy immune system and to boost liver function     . predniSONE (DELTASONE) 20 MG tablet Take two tablets daily for 4 days followed by one tablet daily for 4 days 12 tablet 0  . tiotropium (SPIRIVA) 18 MCG inhalation capsule Place 1 capsule (18 mcg total) into inhaler and inhale daily. 30 capsule 11   No facility-administered medications prior to visit.     Per HPI unless specifically indicated in ROS section below Review of Systems Objective:  BP 138/78 (  BP Location: Left Arm, Patient Position: Sitting, Cuff Size: Normal)   Pulse 73   Temp 97.8 F (36.6 C) (Temporal)   Ht 5\' 7"  (1.702 m)   Wt 194 lb 4 oz (88.1 kg)   SpO2 97%   BMI 30.42 kg/m   Wt Readings from Last 3 Encounters:  03/27/20 194 lb 4 oz (88.1 kg)  02/06/20 195 lb (88.5 kg)  02/04/20 195 lb (88.5 kg)      Physical Exam Vitals and nursing note reviewed.  Constitutional:      Appearance: Normal appearance. He is not ill-appearing.  Cardiovascular:     Rate and Rhythm: Tachycardia present. Rhythm irregularly  irregular.     Pulses: Normal pulses.     Heart sounds: Normal heart sounds. No murmur heard.   Pulmonary:     Effort: Pulmonary effort is normal. No respiratory distress.     Breath sounds: Normal breath sounds. No wheezing, rhonchi or rales.  Musculoskeletal:     Right lower leg: No edema.     Left lower leg: No edema.  Neurological:     Mental Status: He is alert.  Psychiatric:        Mood and Affect: Mood normal.        Behavior: Behavior normal.       Results for orders placed or performed in visit on 14/48/18  Basic metabolic panel  Result Value Ref Range   Sodium 133 (L) 135 - 145 mEq/L   Potassium 4.6 3.5 - 5.1 mEq/L   Chloride 99 96 - 112 mEq/L   CO2 28 19 - 32 mEq/L   Glucose, Bld 118 (H) 70 - 99 mg/dL   BUN 14 6 - 23 mg/dL   Creatinine, Ser 0.92 0.40 - 1.50 mg/dL   GFR 81.67 >60.00 mL/min   Calcium 9.6 8.4 - 10.5 mg/dL   EKG - atrial fibrillation with normal axis without acute ST/T changes, one PVC.  Assessment & Plan:  This visit occurred during the SARS-CoV-2 public health emergency.  Safety protocols were in place, including screening questions prior to the visit, additional usage of staff PPE, and extensive cleaning of exam room while observing appropriate contact time as indicated for disinfecting solutions.   Problem List Items Addressed This Visit    Tobacco abuse    Continued smoker. Continue to encourage full cessation.       Prediabetes    Latest A1c through the [facility] 8/31 was 5.8%.       Pre-op evaluation - Primary    RCRI = 0.  However with new afib, will refer to cardiology for cardiac clearance prior to surgery.       Relevant Orders   DG Chest 2 View   EKG 12-Lead (Completed)   Ambulatory referral to Cardiology   CBC with Differential/Platelet   Basic metabolic panel   TSH   Obesity, Class I, BMI 30-34.9   Lumbar radiculopathy, chronic    May need lumbar surgery after cervical issue resolved.       Habitual alcohol use    Caution  with upcoming surgery.  ?relation to afib.       Essential hypertension    Chronic, recently elevated. He tool 2 lisinopril 10mg  this morning. See below - will start metoprolol 25mg  bid, continue lisinopril 10mg  daily.       Relevant Medications   metoprolol tartrate (LOPRESSOR) 25 MG tablet   apixaban (ELIQUIS) 5 MG TABS tablet   DEGENERATIVE DISC DISEASE, CERVICAL SPINE  Cervical myelopathy with severe spinal stenosis C1/2. Needs C1/2 and C2/3 laminectomy and fusion [name]). With new afib, will urgently refer to cards for further eval prior to surgery.       COPD (chronic obstructive pulmonary disease) ([place])    Continued smoker.  Continues spiriva, symbicort with PRN albuterol.  Check CXR today. No COPD symptoms at this time.       CAD (coronary artery disease)    On aspirin, statin.       Relevant Medications   metoprolol tartrate (LOPRESSOR) 25 MG tablet   apixaban (ELIQUIS) 5 MG TABS tablet   Atrial fibrillation (HCC)    New. Tachycardic. Start metoprolol 25mg  bid, start eliquis 5mg  bid. Check TSH. Refer to cardiology for further eval/management in setting of upcoming neck surgery. CHADSVASC2 = 2.       Relevant Medications   metoprolol tartrate (LOPRESSOR) 25 MG tablet   apixaban (ELIQUIS) 5 MG TABS tablet   Other Relevant Orders   Ambulatory referral to Cardiology   CBC with Differential/Platelet   TSH       Meds ordered this encounter  Medications  . metoprolol tartrate (LOPRESSOR) 25 MG tablet    Sig: Take 1 tablet (25 mg total) by mouth 2 (two) times daily.    Dispense:  60 tablet    Refill:  3  . apixaban (ELIQUIS) 5 MG TABS tablet    Sig: Take 1 tablet (5 mg total) by mouth 2 (two) times daily.    Dispense:  60 tablet    Refill:  3    New onset afib   Orders Placed This Encounter  Procedures  . DG Chest 2 View    Standing Status:   Future    Number of Occurrences:   1    Standing Expiration Date:   03/27/2021    Order Specific Question:    Reason for Exam (SYMPTOM  OR DIAGNOSIS REQUIRED)    Answer:   preop eval    Order Specific Question:   Preferred imaging location?    Answer:   [facility]    Order Specific Question:   Radiology Contrast Protocol - do NOT remove file path    Answer:   [email or web address]  . CBC with Differential/Platelet  . Basic metabolic panel  . TSH  . Ambulatory referral to Cardiology    Referral Priority:   Urgent    Referral Type:   Consultation    Referral Reason:   Specialty Services Required    Requested Specialty:   Cardiology    Number of Visits Requested:   1  . EKG 12-Lead    Patient instructions: New atrial fibrillation noted today.  Labs today.  I will send report to Dr [name], and will get you in urgently with cardiology for further evaluation.  Start eliquis 5mg  twice daily. Hold aspirin for now, check with cardiology on aspirin dose.  Start metoprolol 25mg  twice daily.   Follow up plan: Return if symptoms worsen or fail to improve.  Ria Bush, MD

## 2020-03-27 NOTE — Progress Notes (Signed)
Mr. Toomey denies chest pain or shortness of breath.  Patient denies s/s of Covid for himself or any of his contacts.  Patient will have Covid test at [facility]  (arranged by his daughter - nurse at [facility]). I informed patient that after the test that he will need to be in quarantine until after surgery.  Mr. Barrack see a medical MD at [facility] an PCP Dr. Biagio Borg,, "I see a Dr. Every three months."  Mr. Grandison said that he has not seen a cardiologist since 2016- "I was told that everything was ok with my heart."  Mr. Esterline reports that he does not check Blood Pressure at home, "everythine you go to a Dr. Blood pressure goes up. Patient states he stopped Aspirin 5 days ago.

## 2020-03-27 NOTE — Assessment & Plan Note (Addendum)
New. Tachycardic. Start metoprolol 25mg  bid, start eliquis 5mg  bid. Check TSH. Refer to cardiology for further eval/management in setting of upcoming neck surgery. CHADSVASC2 = 2.

## 2020-03-27 NOTE — Assessment & Plan Note (Addendum)
Chronic, recently elevated. He tool 2 lisinopril 10mg  this morning. See below - will start metoprolol 25mg  bid, continue lisinopril 10mg  daily.

## 2020-03-27 NOTE — Assessment & Plan Note (Signed)
On aspirin, statin.  

## 2020-03-27 NOTE — Assessment & Plan Note (Addendum)
Continued smoker.  Continues spiriva, symbicort with PRN albuterol.  Check CXR today. No COPD symptoms at this time.

## 2020-03-27 NOTE — Assessment & Plan Note (Signed)
Caution with upcoming surgery.  ?relation to afib.

## 2020-03-27 NOTE — Assessment & Plan Note (Signed)
Continued smoker. Continue to encourage full cessation.

## 2020-03-27 NOTE — Assessment & Plan Note (Signed)
Latest A1c through the [facility] 8/31 was 5.8%.

## 2020-03-27 NOTE — Assessment & Plan Note (Signed)
May need lumbar surgery after cervical issue resolved.

## 2020-03-27 NOTE — Patient Instructions (Signed)
New atrial fibrillation noted today.  Labs today.  I will send report to Dr [name], and will get you in urgently with cardiology for further evaluation.  Start eliquis 5mg  twice daily. Hold aspirin for now, check with cardiology on aspirin dose.  Start metoprolol 25mg  twice daily.   Atrial Fibrillation  Atrial fibrillation is a type of irregular or rapid heartbeat (arrhythmia). In atrial fibrillation, the top part of the heart (atria) beats in an irregular pattern. This makes the heart unable to pump blood normally and effectively. The goal of treatment is to prevent blood clots from forming, control your heart rate, or restore your heartbeat to a normal rhythm. If this condition is not treated, it can cause serious problems, such as a weakened heart muscle (cardiomyopathy) or a stroke. What are the causes? This condition is often caused by medical conditions that damage the heart's electrical system. These include:  High blood pressure (hypertension). This is the most common cause.  Certain heart problems or conditions, such as heart failure, coronary artery disease, heart valve problems, or heart surgery.  Diabetes.  Overactive thyroid (hyperthyroidism).  Obesity.  Chronic kidney disease. In some cases, the cause of this condition is not known. What increases the risk? This condition is more likely to develop in:  Older people.  People who smoke.  Athletes who do endurance exercise.  People who have a family history of atrial fibrillation.  Men.  People who use drugs.  People who drink a lot of alcohol.  People who have lung conditions, such as emphysema, pneumonia, or COPD.  People who have obstructive sleep apnea. What are the signs or symptoms? Symptoms of this condition include:  A feeling that your heart is racing or beating irregularly.  Discomfort or pain in your chest.  Shortness of breath.  Sudden light-headedness or weakness.  Tiring easily during  exercise or activity.  Fatigue.  Syncope (fainting).  Sweating. In some cases, there are no symptoms. How is this diagnosed? Your health care provider may detect atrial fibrillation when taking your pulse. If detected, this condition may be diagnosed with:  An electrocardiogram (ECG) to check electrical signals of the heart.  An ambulatory cardiac monitor to record your heart's activity for a few days.  A transthoracic echocardiogram (TTE) to create pictures of your heart.  A transesophageal echocardiogram (TEE) to create even closer pictures of your heart.  A stress test to check your blood supply while you exercise.  Imaging tests, such as a CT scan or chest X-ray.  Blood tests. How is this treated? Treatment depends on underlying conditions and how you feel when you experience atrial fibrillation. This condition may be treated with:  Medicines to prevent blood clots or to treat heart rate or heart rhythm problems.  Electrical cardioversion to reset the heart's rhythm.  A pacemaker to correct abnormal heart rhythm.  Ablation to remove the heart tissue that sends abnormal signals.  Left atrial appendage closure to seal the area where blood clots can form. In some cases, underlying conditions will be treated. Follow these instructions at home: Medicines  Take over-the counter and prescription medicines only as told by your health care provider.  Do not take any new medicines without talking to your health care provider.  If you are taking blood thinners: ? Talk with your health care provider before you take any medicines that contain aspirin or NSAIDs, such as ibuprofen. These medicines increase your risk for dangerous bleeding. ? Take your medicine exactly as  told, at the same time every day. ? Avoid activities that could cause injury or bruising, and follow instructions about how to prevent falls. ? Wear a medical alert bracelet or carry a card that lists what  medicines you take. Lifestyle      Do not use any products that contain nicotine or tobacco, such as cigarettes, e-cigarettes, and chewing tobacco. If you need help quitting, ask your health care provider.  Eat heart-healthy foods. Talk with a dietitian to make an eating plan that is right for you.  Exercise regularly as told by your health care provider.  Do not drink alcohol.  Lose weight if you are overweight.  Do not use drugs, including cannabis. General instructions  If you have obstructive sleep apnea, manage your condition as told by your health care provider.  Do not use diet pills unless your health care provider approves. Diet pills can make heart problems worse.  Keep all follow-up visits as told by your health care provider. This is important. Contact a health care provider if you:  Notice a change in the rate, rhythm, or strength of your heartbeat.  Are taking a blood thinner and you notice more bruising.  Tire more easily when you exercise or do heavy work.  Have a sudden change in weight. Get help right away if you have:   Chest pain, abdominal pain, sweating, or weakness.  Trouble breathing.  Side effects of blood thinners, such as blood in your vomit, stool, or urine, or bleeding that cannot stop.  Any symptoms of a stroke. "BE FAST" is an easy way to remember the main warning signs of a stroke: ? B - Balance. Signs are dizziness, sudden trouble walking, or loss of balance. ? E - Eyes. Signs are trouble seeing or a sudden change in vision. ? F - Face. Signs are sudden weakness or numbness of the face, or the face or eyelid drooping on one side. ? A - Arms. Signs are weakness or numbness in an arm. This happens suddenly and usually on one side of the body. ? S - Speech. Signs are sudden trouble speaking, slurred speech, or trouble understanding what people say. ? T - Time. Time to call emergency services. Write down what time symptoms started.  Other  signs of a stroke, such as: ? A sudden, severe headache with no known cause. ? Nausea or vomiting. ? Seizure. These symptoms may represent a serious problem that is an emergency. Do not wait to see if the symptoms will go away. Get medical help right away. Call your local emergency services (911 in the [place] Do not drive yourself to the hospital. Summary  Atrial fibrillation is a type of irregular or rapid heartbeat (arrhythmia).  Symptoms include a feeling that your heart is beating fast or irregularly.  You may be given medicines to prevent blood clots or to treat heart rate or heart rhythm problems.  Get help right away if you have signs or symptoms of a stroke.  Get help right away if you cannot catch your breath or have chest pain or pressure. This information is not intended to replace advice given to you by your health care provider. Make sure you discuss any questions you have with your health care provider. Document Revised: 12/26/2018 Document Reviewed: 12/26/2018 Elsevier Patient Education  [place]

## 2020-03-28 LAB — BASIC METABOLIC PANEL
BUN: 15 mg/dL (ref 7–25)
CO2: 24 mmol/L (ref 20–32)
Calcium: 9.6 mg/dL (ref 8.6–10.3)
Chloride: 101 mmol/L (ref 98–110)
Creat: 0.99 mg/dL (ref 0.70–1.25)
Glucose, Bld: 100 mg/dL — ABNORMAL HIGH (ref 65–99)
Potassium: 4.4 mmol/L (ref 3.5–5.3)
Sodium: 137 mmol/L (ref 135–146)

## 2020-03-28 LAB — CBC WITH DIFFERENTIAL/PLATELET
Absolute Monocytes: 814 cells/uL (ref 200–950)
Basophils Absolute: 30 cells/uL (ref 0–200)
Basophils Relative: 0.4 %
Eosinophils Absolute: 52 cells/uL (ref 15–500)
Eosinophils Relative: 0.7 %
HCT: 41 % (ref 38.5–50.0)
Hemoglobin: 14.1 g/dL (ref 13.2–17.1)
Lymphs Abs: 2109 cells/uL (ref 850–3900)
MCH: 33.8 pg — ABNORMAL HIGH (ref 27.0–33.0)
MCHC: 34.4 g/dL (ref 32.0–36.0)
MCV: 98.3 fL (ref 80.0–100.0)
MPV: 9.7 fL (ref 7.5–12.5)
Monocytes Relative: 11 %
Neutro Abs: 4396 cells/uL (ref 1500–7800)
Neutrophils Relative %: 59.4 %
Platelets: 298 10*3/uL (ref 140–400)
RBC: 4.17 10*6/uL — ABNORMAL LOW (ref 4.20–5.80)
RDW: 11.7 % (ref 11.0–15.0)
Total Lymphocyte: 28.5 %
WBC: 7.4 10*3/uL (ref 3.8–10.8)

## 2020-03-28 LAB — TSH: TSH: 3.42 mIU/L (ref 0.40–4.50)

## 2020-03-28 LAB — SARS CORONAVIRUS 2 (TAT 6-24 HRS): SARS Coronavirus 2: NEGATIVE

## 2020-03-30 ENCOUNTER — Other Ambulatory Visit: Payer: Medicare Other

## 2020-03-30 ENCOUNTER — Ambulatory Visit (INDEPENDENT_AMBULATORY_CARE_PROVIDER_SITE_OTHER): Payer: Medicare Other | Admitting: Cardiology

## 2020-03-30 ENCOUNTER — Telehealth: Payer: Self-pay

## 2020-03-30 ENCOUNTER — Inpatient Hospital Stay (HOSPITAL_COMMUNITY): Admission: RE | Admit: 2020-03-30 | Payer: Medicare Other | Source: Home / Self Care | Admitting: Neurosurgery

## 2020-03-30 ENCOUNTER — Other Ambulatory Visit: Payer: Self-pay

## 2020-03-30 ENCOUNTER — Encounter: Payer: Self-pay | Admitting: Cardiology

## 2020-03-30 ENCOUNTER — Telehealth: Payer: Self-pay | Admitting: Cardiology

## 2020-03-30 VITALS — BP 128/78 | HR 109 | Ht 67.0 in | Wt 193.5 lb

## 2020-03-30 DIAGNOSIS — I2510 Atherosclerotic heart disease of native coronary artery without angina pectoris: Secondary | ICD-10-CM | POA: Diagnosis not present

## 2020-03-30 DIAGNOSIS — Z01818 Encounter for other preprocedural examination: Secondary | ICD-10-CM

## 2020-03-30 DIAGNOSIS — I10 Essential (primary) hypertension: Secondary | ICD-10-CM

## 2020-03-30 DIAGNOSIS — I4891 Unspecified atrial fibrillation: Secondary | ICD-10-CM

## 2020-03-30 DIAGNOSIS — F17200 Nicotine dependence, unspecified, uncomplicated: Secondary | ICD-10-CM | POA: Diagnosis not present

## 2020-03-30 SURGERY — POSTERIOR CERVICAL FUSION/FORAMINOTOMY LEVEL 2
Anesthesia: General

## 2020-03-30 MED ORDER — LISINOPRIL 10 MG PO TABS
5.0000 mg | ORAL_TABLET | Freq: Every day | ORAL | 3 refills | Status: DC
Start: 2020-03-30 — End: 2020-06-30

## 2020-03-30 MED ORDER — APIXABAN 5 MG PO TABS: 5.0000 mg | ORAL_TABLET | Freq: Two times a day (BID) | ORAL | 5 refills | Status: DC

## 2020-03-30 MED ORDER — METOPROLOL TARTRATE 50 MG PO TABS: 50.0000 mg | ORAL_TABLET | Freq: Two times a day (BID) | ORAL | 5 refills | Status: DC

## 2020-03-30 NOTE — Progress Notes (Signed)
Cardiology Office Note:    Date:  03/30/2020   ID:  [name], DOB Jul 26, 1950, MRN [id number]  PCP:  Pedro Bush, MD  [facility] Cardiologist:  Pedro Sable, MD  [place] Electrophysiologist:  None   Referring MD: Pedro Bush, MD   Chief Complaint  Patient presents with  . OTHER    Afib/Cardiac clearance. Meds reviewed verbally with pt.    History of Present Illness:    [name] is a 69 y.o. male with a hx of nonobstructive CAD ([place] 2016 LAD 30%, RCA 20%), hypertension, hyperlipidemia, COPD, current smoker x40+ years who presents presents for preop risk assessment and also for atrial fibrillation.  Patient with history of neck pains, back pains with radiculopathy.  Diagnosed with cervical spinal stenosis.  Laminectomy is being planned.  Saw primary care provider 3 days ago for preop exam, EKG noted to be in atrial fibrillation rapid ventricular response.  He was started on Eliquis and Lopressor.  He states his Eliquis was not approved by his insurance.  He denies any history of palpitations or chest pain.  Endorses shortness of breath with exertion for years which he attributes to COPD.  He is also a current smoker.   Past Medical History:  Diagnosis Date  . Allergic rhinitis 07/1998  . Aortic atherosclerosis ([place]) 09/2016   by CT  . Arthritis    BACK AND RIGHT KNEE  . CAD (coronary artery disease) 09/2016   RCA, LAD by CT  . COPD (chronic obstructive pulmonary disease) ([place]) 07/1998   centrilobular emphysema (09/2016) w ongoing tobacco use, spirometry (02/2013)  . Dysrhythmia    PVC'S  . ETOH abuse   . Hepatitis    pos test once not now,  Positive   . HTN (hypertension) 05/1998  . Hyperlipidemia 06/1996  . Lumbar spinal stenosis 03/2013   s/p laminectomy  . 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 Shortness of breath dyspnea    WITH  EXERTION  . Smoker     Past Surgical History:  Procedure Laterality Date  . ANTERIOR CERVICAL DECOMP/DISCECTOMY FUSION  2003   C3-7  . BACK SURGERY  2007  . CARDIAC CATHETERIZATION N/A 07/01/2015   Procedure: Left Heart Cath and Coronary Angiography;  Surgeon: Wellington Hampshire, MD;  Location: [place] [facility];  Service: Cardiovascular;  Laterality: N/A;  . COLONOSCOPY  09/2010   HPx2, diverticula, hemorrhoids, rpt 10 yrs [place])  . ETT  2010   normal, ABIs normal  . KNEE ARTHROSCOPY Right 11   cartlage, ligaments  . KNEE ARTHROSCOPY Left 84  . LUMBAR LAMINECTOMY/DECOMPRESSION MICRODISCECTOMY Left 03/27/2013   Procedure: LUMBAR LAMINECTOMY/DECOMPRESSION MICRODISCECTOMY 2 LEVELS;  Surgeon: Ophelia Charter, MD;  Location: [facility];  Service: Neurosurgery;  Laterality: Left;  LEFT Lumbar Three Four diskectomy with Lumbar Three-Four, Four-Five Laminectomy. L3/4 ahd L4/5 for LSS [name])   . SHOULDER ARTHROSCOPY Right 11  . spriometry  02/2013   COPD with some restriction thought to be obesity related ([place])  . TOTAL KNEE ARTHROPLASTY Right 06/27/2014   Procedure: RIGHT TOTAL KNEE ARTHROPLASTY;  Surgeon: Mcarthur Rossetti, MD;  Location: [facility];  Service: Orthopedics;  Laterality: Right;  . US ECHOCARDIOGRAPHY  01/2013   WNL, mild diastolic dysfunction    Current Medications: Current Meds  Medication Sig  . Albuterol Sulfate (PROAIR RESPICLICK) 160 (90 Base) MCG/ACT  AEPB Inhale 2 puffs into the lungs every 6 (six) hours as needed (cough, shortness of breath).  [name] aspirin EC 81 MG tablet Take 81 mg by mouth daily.  [name] atorvastatin (LIPITOR) 10 MG tablet TAKE 1 TABLET(10 MG) BY MOUTH DAILY (Patient taking differently: Take 10 mg by mouth daily. )  . budesonide-formoterol (SYMBICORT) 80-4.5 MCG/ACT inhaler Inhale 1 puff into the lungs 2 (two) times daily.  [name] lisinopril (ZESTRIL) 10 MG tablet Take 0.5 tablets (5 mg total) by mouth daily.  . metoprolol tartrate (LOPRESSOR) 50 MG  tablet Take 1 tablet (50 mg total) by mouth 2 (two) times daily.  [name] OVER THE COUNTER MEDICATION Take 1 capsule by mouth daily. [place] Sulfurzyme (for healthy immune system and to boost liver function   . tiotropium (SPIRIVA) 18 MCG inhalation capsule Place 1 capsule (18 mcg total) into inhaler and inhale daily.  . [DISCONTINUED] lisinopril (ZESTRIL) 10 MG tablet TAKE 1 TABLET BY MOUTH EVERY DAY (Patient taking differently: Take 10 mg by mouth daily. )  . [DISCONTINUED] metoprolol tartrate (LOPRESSOR) 25 MG tablet Take 1 tablet (25 mg total) by mouth 2 (two) times daily.     Allergies:   Chantix [varenicline]   Social History   Socioeconomic History  . Marital status: Married    Spouse name: Not on file  . Number of children: Not on file  . Years of education: Not on file  . Highest education level: Not on file  Occupational History  . Not on file  Tobacco Use  . Smoking status: Current Every Day Smoker    Packs/day: 0.50    Years: 40.00    Pack years: 20.00    Types: Cigarettes  . Smokeless tobacco: Never Used  Vaping Use  . Vaping Use: Never used  Substance and Sexual Activity  . Alcohol use: Yes    Alcohol/week: 35.0 standard drinks    Types: 35 Cans of beer per week    Comment: 4-5 beers a day  . Drug use: No  . Sexual activity: Not Currently  Other Topics Concern  . Not on file  Social History Narrative   Lives with wife. Has 1 son, 2 daughters.    Occupation: Psychologist, clinical.    Activity: no exercise   Diet: good water, fruits/vegetables daily      Screened positive for OSA at recent hospitalization (03/2013)   Social Determinants of Health   Financial Resource Strain: Low Risk   . Difficulty of Paying Living Expenses: Not hard at all  Food Insecurity: No Food Insecurity  . Worried About Charity fundraiser in the Last Year: Never true  . Ran Out of Food in the Last Year: Never true  Transportation Needs: No Transportation Needs  . Lack of  Transportation (Medical): No  . Lack of Transportation (Non-Medical): No  Physical Activity: Inactive  . Days of Exercise per Week: 0 days  . Minutes of Exercise per Session: 0 min  Stress: No Stress Concern Present  . Feeling of Stress : Not at all  Social Connections:   . Frequency of Communication with Friends and Family: Not on file  . Frequency of Social Gatherings with Friends and Family: Not on file  . Attends Religious Services: Not on file  . Active Member of Clubs or Organizations: Not on file  . Attends Archivist Meetings: Not on file  . Marital Status: Not on file     Family History: The patient's family history includes Coronary  artery disease in his father and mother; Diabetes in his father; Hypertension in his father and mother; Irregular heart beat in his brother; Maple syrup urine disease in his brother; Pneumonia in his sister; Stroke in his father and mother. There is no history of Cancer.  ROS:   Please see the history of present illness.     All other systems reviewed and are negative.  EKGs/Labs/Other Studies Reviewed:    The following studies were reviewed today:   EKG:  EKG is  ordered today.  The ekg ordered today demonstrates atrial fibrillation heart rate 106  Recent Labs: 03/17/2020: ALT 29 03/27/2020: BUN 15; Creat 0.99; Hemoglobin 14.1; Platelets 298; Potassium 4.4; Sodium 137; TSH 3.42  Recent Lipid Panel    Component Value Date/Time   CHOL 174 03/17/2020 0000   TRIG 35.0 09/30/2019 0804   HDL 121 (A) 03/17/2020 0000   CHOLHDL 2 09/30/2019 0804   VLDL 7.0 09/30/2019 0804   LDLCALC 47 03/17/2020 0000   LDLDIRECT 82.0 09/07/2016 0900    Physical Exam:    VS:  BP 128/78 (BP Location: Right Arm, Patient Position: Sitting, Cuff Size: Normal)   Pulse (!) 109   Ht 5\' 7"  (1.702 m)   Wt 193 lb 8 oz (87.8 kg)   SpO2 98%   BMI 30.31 kg/m     Wt Readings from Last 3 Encounters:  03/30/20 193 lb 8 oz (87.8 kg)  03/27/20 194 lb 4 oz  (88.1 kg)  02/06/20 195 lb (88.5 kg)     GEN:  Well nourished, well developed in no acute distress HEENT: Normal NECK: No JVD; No carotid bruits LYMPHATICS: No lymphadenopathy CARDIAC: Irregular irregular, no murmurs, rubs, gallops RESPIRATORY: Decreased breath sounds, no wheezing ABDOMEN: Soft, non-tender, non-distended MUSCULOSKELETAL:  No edema; No deformity  SKIN: Warm and dry NEUROLOGIC:  Alert and oriented x 3 PSYCHIATRIC:  Normal affect   ASSESSMENT:    1. Coronary artery disease involving native coronary artery of native heart without angina pectoris   2. Atrial fibrillation, unspecified type ([place])   3. Essential hypertension   4. Pre-op evaluation   5. Smoking    PLAN:    In order of problems listed above:  1. Patient with new onset persistent atrial fibrillation.  CHA2DS2-VASc score 3(age, htn, vasc).  Increase Lopressor to 50 mg twice daily.  Start Eliquis 5 mg twice daily.  Get echocardiogram.  Will give patient samples of Eliquis and also drug assistance information.  Will consider cardioversion in the future after uninterrupted anticoagulation for at least 3 weeks. 2. History of nonobstructive CAD.  Asymptomatic with no chest pain.  Continue aspirin, statin.  May stop aspirin in the future if Eliquis is approved from patient has no side effects. 3. History of hypertension, BP controlled.  Reduce lisinopril to 5 mg daily, increase Lopressor to 50 mg twice daily. 4. Cervical spinal stenosis, laminectomy being planned.  Low risk procedure.  RCRI equals 0.  Will evaluate patient with echocardiogram as above due to new onset A. fib.  If echocardiogram is normal, patient can undergo procedure without any additional cardiac testing. 5. Patient is a current smoker, smoking cessation advised.  Follow-up after echocardiogram.  Total encounter time 60 minutes  Greater than 50% was spent in counseling and coordination of care with the patient   Medication Adjustments/Labs  and Tests Ordered: Current medicines are reviewed at length with the patient today.  Concerns regarding medicines are outlined above.  Orders Placed This Encounter  Procedures  .  EKG 12-Lead  . ECHOCARDIOGRAM COMPLETE   Meds ordered this encounter  Medications  . apixaban (ELIQUIS) 5 MG TABS tablet    Sig: Take 1 tablet (5 mg total) by mouth 2 (two) times daily.    Dispense:  60 tablet    Refill:  5  . metoprolol tartrate (LOPRESSOR) 50 MG tablet    Sig: Take 1 tablet (50 mg total) by mouth 2 (two) times daily.    Dispense:  60 tablet    Refill:  5  . lisinopril (ZESTRIL) 10 MG tablet    Sig: Take 0.5 tablets (5 mg total) by mouth daily.    Dispense:  30 tablet    Refill:  3    Patient Instructions  Medication Instructions:   Your physician has recommended you make the following change in your medication:   1)  START apixaban (ELIQUIS) 5 MG TABS tablet: Take 1 tablet (5 mg total) by mouth 2 (two) times daily.  2)  INCREASE your metoprolol tartrate (LOPRESSOR) to 50 MG tablet: Take 1 tablet (50 mg total) by mouth 2 (two)       times daily.  3)  DECREASE your lisinopril (ZESTRIL) 10 MG tablet: Take 0.5 tablets (5 mg total) by mouth daily.   *If you need a refill on your cardiac medications before your next appointment, please call your pharmacy*   Lab Work: None Ordered If you have labs (blood work) drawn today and your tests are completely normal, you will receive your results only by: [name] MyChart Message (if you have MyChart) OR . A paper copy in the mail If you have any lab test that is abnormal or we need to change your treatment, we will call you to review the results.   Testing/Procedures:  1.  Your physician has requested that you have an echocardiogram. Echocardiography is a painless test that uses sound waves to create images of your heart. It provides your doctor with information about the size and shape of your heart and how well your heart's chambers and valves  are working. This procedure takes approximately one hour. There are no restrictions for this procedure.     Follow-Up: At [facility], you and your health needs are our priority.  As part of our continuing mission to provide you with exceptional heart care, we have created designated Provider Care Teams.  These Care Teams include your primary Cardiologist (physician) and Advanced Practice Providers (APPs -  Physician Assistants and Nurse Practitioners) who all work together to provide you with the care you need, when you need it.  We recommend signing up for the patient portal called "MyChart".  Sign up information is provided on this After Visit Summary.  MyChart is used to connect with patients for Virtual Visits (Telemedicine).  Patients are able to view lab/test results, encounter notes, upcoming appointments, etc.  Non-urgent messages can be sent to your provider as well.   To learn more about what you can do with MyChart, go to [email or web address].    Your next appointment:   Follow up after Echo   The format for your next appointment:   In Person  Provider:   Kate Sable, MD   Other Instructions      Signed, Pedro Sable, MD  03/30/2020 5:25 PM    [place]

## 2020-03-30 NOTE — Telephone Encounter (Signed)
Susie calling in with Eliquis Lot numbers on behalf of patient #[id number]  Call back [phone] if needed

## 2020-03-30 NOTE — Addendum Note (Signed)
Addended by: Ria Bush on: 03/30/2020 11:28 AM   Modules accepted: Orders

## 2020-03-30 NOTE — Telephone Encounter (Signed)
Documented in Sample book. Closing Encounter.

## 2020-03-30 NOTE — Telephone Encounter (Signed)
Started on Eliquis at 03/30/20 OV, patient has Medicare and is unable to get 30 day free supply. gave 2 weeks worth of medicine and PAF forms for patient to fill out and bring back into the office for [place] to fax.

## 2020-03-30 NOTE — Telephone Encounter (Addendum)
Would likely in that case recommend pricing out xarelto a different blood thinner.  However as he has cards appt this afternoon recommend just waiting for that appt to see what cardiology recommends regarding blood thinner.

## 2020-03-30 NOTE — Telephone Encounter (Signed)
Lvm asking pt to call back.  Need to relay Dr. G's message.  

## 2020-03-30 NOTE — Telephone Encounter (Signed)
Pt aware of dr g comments 

## 2020-03-30 NOTE — Telephone Encounter (Signed)
Patient contacted the office and states that his insurance will not cover the blood thinner that was prescribed. He states he was advised to call Dr. Darnell Level if this happens.   Please advise.

## 2020-03-30 NOTE — Patient Instructions (Signed)
Medication Instructions:   Your physician has recommended you make the following change in your medication:   1)  START apixaban (ELIQUIS) 5 MG TABS tablet: Take 1 tablet (5 mg total) by mouth 2 (two) times daily.  2)  INCREASE your metoprolol tartrate (LOPRESSOR) to 50 MG tablet: Take 1 tablet (50 mg total) by mouth 2 (two)       times daily.  3)  DECREASE your lisinopril (ZESTRIL) 10 MG tablet: Take 0.5 tablets (5 mg total) by mouth daily.   *If you need a refill on your cardiac medications before your next appointment, please call your pharmacy*   Lab Work: None Ordered If you have labs (blood work) drawn today and your tests are completely normal, you will receive your results only by: [name] MyChart Message (if you have MyChart) OR . A paper copy in the mail If you have any lab test that is abnormal or we need to change your treatment, we will call you to review the results.   Testing/Procedures:  1.  Your physician has requested that you have an echocardiogram. Echocardiography is a painless test that uses sound waves to create images of your heart. It provides your doctor with information about the size and shape of your heart and how well your heart's chambers and valves are working. This procedure takes approximately one hour. There are no restrictions for this procedure.     Follow-Up: At [facility], you and your health needs are our priority.  As part of our continuing mission to provide you with exceptional heart care, we have created designated Provider Care Teams.  These Care Teams include your primary Cardiologist (physician) and Advanced Practice Providers (APPs -  Physician Assistants and Nurse Practitioners) who all work together to provide you with the care you need, when you need it.  We recommend signing up for the patient portal called "MyChart".  Sign up information is provided on this After Visit Summary.  MyChart is used to connect with patients for Virtual  Visits (Telemedicine).  Patients are able to view lab/test results, encounter notes, upcoming appointments, etc.  Non-urgent messages can be sent to your provider as well.   To learn more about what you can do with MyChart, go to [email or web address].    Your next appointment:   Follow up after Echo   The format for your next appointment:   In Person  Provider:   Kate Sable, MD   Other Instructions

## 2020-03-30 NOTE — Telephone Encounter (Signed)
Noted  

## 2020-03-31 ENCOUNTER — Ambulatory Visit (INDEPENDENT_AMBULATORY_CARE_PROVIDER_SITE_OTHER): Payer: Medicare Other

## 2020-03-31 DIAGNOSIS — I4891 Unspecified atrial fibrillation: Secondary | ICD-10-CM

## 2020-03-31 LAB — ECHOCARDIOGRAM COMPLETE
Area-P 1/2: 4.31 cm2
S' Lateral: 3.4 cm
Single Plane A4C EF: 58.3 %

## 2020-04-02 ENCOUNTER — Inpatient Hospital Stay (HOSPITAL_COMMUNITY): Admission: RE | Admit: 2020-04-02 | Payer: Medicare Other | Source: Ambulatory Visit

## 2020-04-06 ENCOUNTER — Other Ambulatory Visit (HOSPITAL_COMMUNITY): Payer: Medicare Other

## 2020-04-06 ENCOUNTER — Telehealth: Payer: Self-pay | Admitting: Family Medicine

## 2020-04-06 NOTE — Telephone Encounter (Signed)
I spoke w/patient and he says he cannot make it at 4:30.  He will see Dr. Damita Dunnings in the morning.  Thank you!

## 2020-04-06 NOTE — Telephone Encounter (Signed)
I'm just seeing this message now.  Please have him come in at 4:30pm for appt.

## 2020-04-06 NOTE — Telephone Encounter (Signed)
Pt says he has suffered w/burning, pain, pressure, and trouble emptying bladder all weekend.  There are no available appts today and he does not want to go to UC or ED. I did schedule an appt for him w/Dr. Sharlene Motts in the morning at 9:30 but he asked that I send you a note to make you aware and see if there is any help/relief you could suggest until then.  Pt requests c/b [phone]  Thank you!

## 2020-04-07 ENCOUNTER — Other Ambulatory Visit: Payer: Self-pay

## 2020-04-07 ENCOUNTER — Encounter: Payer: Self-pay | Admitting: Family Medicine

## 2020-04-07 ENCOUNTER — Ambulatory Visit (INDEPENDENT_AMBULATORY_CARE_PROVIDER_SITE_OTHER): Payer: Medicare Other | Admitting: Family Medicine

## 2020-04-07 VITALS — BP 132/92 | HR 88 | Temp 96.5°F | Ht 67.0 in | Wt 198.6 lb

## 2020-04-07 DIAGNOSIS — R300 Dysuria: Secondary | ICD-10-CM | POA: Diagnosis not present

## 2020-04-07 DIAGNOSIS — I2510 Atherosclerotic heart disease of native coronary artery without angina pectoris: Secondary | ICD-10-CM

## 2020-04-07 LAB — POC URINALSYSI DIPSTICK (AUTOMATED)
Bilirubin, UA: NEGATIVE
Glucose, UA: NEGATIVE
Ketones, UA: NEGATIVE
Nitrite, UA: NEGATIVE
Protein, UA: POSITIVE — AB
Spec Grav, UA: 1.02 (ref 1.010–1.025)
Urobilinogen, UA: 0.2 E.U./dL
pH, UA: 6 (ref 5.0–8.0)

## 2020-04-07 MED ORDER — SULFAMETHOXAZOLE-TRIMETHOPRIM 800-160 MG PO TABS: 1.0000 | ORAL_TABLET | Freq: Two times a day (BID) | ORAL | 0 refills | Status: DC

## 2020-04-07 NOTE — Progress Notes (Signed)
This visit occurred during the SARS-CoV-2 public health emergency.  Safety protocols were in place, including screening questions prior to the visit, additional usage of staff PPE, and extensive cleaning of exam room while observing appropriate contact time as indicated for disinfecting solutions.  Dribbling with urination.  Slow stream.  Sx going for on weeks.  Now with burning in the last few days.  No no FCNAVD.  No abd pain.  No h/o similar prev.  No bowel sx.  No pain sitting.  No penile changes.  No rash.    Meds, vitals, and allergies reviewed.   ROS: Per HPI unless specifically indicated in ROS section   nad ncat Neck supple, no LA ctab IRR, not tachy.   Abd not ttp. Normal BS Skin well perfused.

## 2020-04-07 NOTE — Patient Instructions (Signed)
Drink plenty of water and start the antibiotics today.  We'll contact you with your lab report.   Let us know if you aren't improving or if you are getting worse.  Take care.  Glad to see you.

## 2020-04-08 DIAGNOSIS — R339 Retention of urine, unspecified: Secondary | ICD-10-CM | POA: Insufficient documentation

## 2020-04-08 NOTE — Assessment & Plan Note (Signed)
Nontoxic.  Okay for outpatient follow-up.  Presumed cystitis.  Start Septra.  See notes on urine culture.  Urinalysis discussed with patient at office visit.  He agrees with plan.

## 2020-04-09 ENCOUNTER — Telehealth: Payer: Self-pay | Admitting: Family Medicine

## 2020-04-09 LAB — URINE CULTURE
MICRO NUMBER:: 10976511
SPECIMEN QUALITY:: ADEQUATE

## 2020-04-09 MED ORDER — CEPHALEXIN 500 MG PO CAPS: 500.0000 mg | ORAL_CAPSULE | Freq: Two times a day (BID) | ORAL | 0 refills | Status: DC

## 2020-04-09 NOTE — Telephone Encounter (Signed)
Change to keflex.  rx sent.  Stop septra.  See ucx results.  Please update patient.  If not better with keflex, then let me know.  Thanks.

## 2020-04-09 NOTE — Telephone Encounter (Signed)
Patient advised.

## 2020-04-09 NOTE — Telephone Encounter (Signed)
Pt called stating he is not any better from 9/21 visit  He still dribbling going to bathroom  It takes 15 min sitting on toilet to urinate  He is wanting to know what he needs to do  [place]

## 2020-04-10 NOTE — Telephone Encounter (Signed)
Incoming fax from [employer].   Patient is approved for patient assistance for Eliquis.   Approval dates 04/10/2020 through 07/17/2020

## 2020-04-20 NOTE — Telephone Encounter (Signed)
Please schedule in office tomorrow at 12:30pm. Thanks.

## 2020-04-20 NOTE — Telephone Encounter (Signed)
Pt says he is not having dysuria but is still having urine stream issues. Only has dribbling. Can take 15 minutes to go and not empty bladder. Asking what he can do. Wanted to see Dr Darnell Level tomorrow if possible.

## 2020-04-21 ENCOUNTER — Encounter: Payer: Self-pay | Admitting: Family Medicine

## 2020-04-21 ENCOUNTER — Other Ambulatory Visit: Payer: Self-pay

## 2020-04-21 ENCOUNTER — Ambulatory Visit (INDEPENDENT_AMBULATORY_CARE_PROVIDER_SITE_OTHER): Payer: Medicare Other | Admitting: Family Medicine

## 2020-04-21 VITALS — BP 118/72 | HR 96 | Temp 98.6°F | Ht 67.0 in | Wt 190.0 lb

## 2020-04-21 DIAGNOSIS — Z23 Encounter for immunization: Secondary | ICD-10-CM | POA: Diagnosis not present

## 2020-04-21 DIAGNOSIS — Z720 Tobacco use: Secondary | ICD-10-CM | POA: Diagnosis not present

## 2020-04-21 DIAGNOSIS — M4800 Spinal stenosis, site unspecified: Secondary | ICD-10-CM

## 2020-04-21 DIAGNOSIS — M48062 Spinal stenosis, lumbar region with neurogenic claudication: Secondary | ICD-10-CM | POA: Insufficient documentation

## 2020-04-21 DIAGNOSIS — R339 Retention of urine, unspecified: Secondary | ICD-10-CM

## 2020-04-21 DIAGNOSIS — M5416 Radiculopathy, lumbar region: Secondary | ICD-10-CM

## 2020-04-21 DIAGNOSIS — J449 Chronic obstructive pulmonary disease, unspecified: Secondary | ICD-10-CM

## 2020-04-21 DIAGNOSIS — I2510 Atherosclerotic heart disease of native coronary artery without angina pectoris: Secondary | ICD-10-CM | POA: Diagnosis not present

## 2020-04-21 LAB — POC URINALSYSI DIPSTICK (AUTOMATED)
Bilirubin, UA: NEGATIVE
Blood, UA: NEGATIVE
Glucose, UA: NEGATIVE
Ketones, UA: NEGATIVE
Leukocytes, UA: NEGATIVE
Nitrite, UA: NEGATIVE
Protein, UA: NEGATIVE
Spec Grav, UA: 1.015 (ref 1.010–1.025)
Urobilinogen, UA: 0.2 E.U./dL
pH, UA: 6 (ref 5.0–8.0)

## 2020-04-21 MED ORDER — TAMSULOSIN HCL 0.4 MG PO CAPS: 0.4000 mg | ORAL_CAPSULE | Freq: Every day | ORAL | 3 refills | Status: DC

## 2020-04-21 NOTE — Progress Notes (Signed)
04/22/2020 11:26 AM   [name] 10-07-1950 [id number]  Referring provider: Ria Bush, MD [address] [place],  [place] [address] Chief Complaint  Patient presents with   Urinary Retention    HPI: [name] is a 69 y.o. male who presents today for evaluation and management of urinary retention.   PSA was 2.26 on 03/17/2020.   The patient saw Dr. Damita Dunnings on 04/07/2020. He reported dribbling with urination and slow stream for weeks. He had an onset of burning with urination. No fevers, chills, nausea or vomiting. No abdominal, flank or pelvic pain. UA noted 3+ blood, 3+ leukocytes. Urine culture grew streptococcus agalactiae. Patient was treated with Septra for cystitis.   Follow up UA dipstick per PCP was negative. There was no microscopic UA.   Bladder scan by [facility] today is 386 mL.   He notes going into urinary retention year ago post op knee surgery and he had a catheter placed. He reports symptoms started 1 month ago. He noted some painful dribbling. Septra resolved burning sensation. His most bothersome symptoms is dribbling and frequency. Patient reports 6 months ago he had a slow stream.   The patient is not on any BPH medications. His PCP prescribed Flomax 0.4 mg yesterday. The patient did not pick up medication until he was seen today.   He has lower back pain and was supposed to have surgery on L2 and L4. Surgery was postponed secondary irregular heart beat. He notes back pain radiates down his leg.   He has no feeling in his hands. He would like to have a procedure due for this issue.   PMH: Past Medical History:  Diagnosis Date   Allergic rhinitis 07/1998   Aortic atherosclerosis ([place]) 09/2016   by CT   Arthritis    BACK AND RIGHT KNEE   CAD (coronary artery disease) 09/2016   RCA, LAD by CT   COPD (chronic obstructive pulmonary disease) ([place]) 07/1998   centrilobular emphysema (09/2016) w ongoing tobacco use, spirometry (02/2013)    Dysrhythmia    PVC'S   ETOH abuse    Hepatitis    pos test once not now,  Positive    HTN (hypertension) 05/1998   Hyperlipidemia 06/1996   Lumbar spinal stenosis 03/2013   s/p laminectomy   Positive hepatitis C antibody test 08/2015   neg viral load - likely cleared infection   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   Shortness of breath dyspnea    WITH EXERTION   Smoker     Surgical History: Past Surgical History:  Procedure Laterality Date   ANTERIOR CERVICAL DECOMP/DISCECTOMY FUSION  2003   C3-7   BACK SURGERY  2007   CARDIAC CATHETERIZATION N/A 07/01/2015   Procedure: Left Heart Cath and Coronary Angiography;  Surgeon: Wellington Hampshire, MD;  Location: [facility];  Service: Cardiovascular;  Laterality: N/A;   COLONOSCOPY  09/2010   HPx2, diverticula, hemorrhoids, rpt 10 yrs [place])   ETT  2010   normal, ABIs normal   KNEE ARTHROSCOPY Right 11   cartlage, ligaments   KNEE ARTHROSCOPY Left 84   LUMBAR LAMINECTOMY/DECOMPRESSION MICRODISCECTOMY Left 03/27/2013   Procedure: LUMBAR LAMINECTOMY/DECOMPRESSION MICRODISCECTOMY 2 LEVELS;  Surgeon: Ophelia Charter, MD;  Location: [facility];  Service: Neurosurgery;  Laterality: Left;  LEFT Lumbar Three Four diskectomy with Lumbar Three-Four, Four-Five Laminectomy. L3/4 ahd L4/5 for LSS [name])    SHOULDER ARTHROSCOPY Right 11   spriometry  02/2013   COPD with some restriction thought to be obesity related ([place])   TOTAL KNEE ARTHROPLASTY Right 06/27/2014   Procedure: RIGHT TOTAL KNEE ARTHROPLASTY;  Surgeon: Mcarthur Rossetti, MD;  Location: [facility];  Service: Orthopedics;  Laterality: Right;   US ECHOCARDIOGRAPHY  01/2013   WNL, mild diastolic dysfunction    Home Medications:  Allergies as of 04/22/2020      Reactions   Chantix [varenicline] Other (See Comments)   headaches      Medication List       Accurate as of April 22, 2020 11:59 PM. If you have any questions, ask your  nurse or doctor.        Albuterol Sulfate 108 (90 Base) MCG/ACT Aepb Commonly known as: ProAir RespiClick Inhale 2 puffs into the lungs every 6 (six) hours as needed (cough, shortness of breath).   apixaban 5 MG Tabs tablet Commonly known as: ELIQUIS Take 1 tablet (5 mg total) by mouth 2 (two) times daily.   atorvastatin 10 MG tablet Commonly known as: LIPITOR TAKE 1 TABLET(10 MG) BY MOUTH DAILY   lisinopril 10 MG tablet Commonly known as: ZESTRIL Take 0.5 tablets (5 mg total) by mouth daily.   metoprolol tartrate 50 MG tablet Commonly known as: LOPRESSOR Take 1 tablet (50 mg total) by mouth 2 (two) times daily.   OVER THE COUNTER MEDICATION Take 1 capsule by mouth daily. Young Living Sulfurzyme (for healthy immune system and to boost liver function   Symbicort 80-4.5 MCG/ACT inhaler Generic drug: budesonide-formoterol Inhale 1 puff into the lungs 2 (two) times daily.   tamsulosin 0.4 MG Caps capsule Commonly known as: FLOMAX Take 1 capsule (0.4 mg total) by mouth daily.   tiotropium 18 MCG inhalation capsule Commonly known as: SPIRIVA Place 1 capsule (18 mcg total) into inhaler and inhale daily.       Allergies:  Allergies  Allergen Reactions   Chantix [Varenicline] Other (See Comments)    headaches    Family History: Family History  Problem Relation Age of Onset   Coronary artery disease Mother    Hypertension Mother    Stroke Mother    Coronary artery disease Father    Hypertension Father    Diabetes Father    Stroke Father    Pneumonia Sister    Irregular heart beat Brother    Maple syrup urine disease Brother    Cancer Neg Hx     Social History:  reports that he has been smoking cigarettes. He has a 20.00 pack-year smoking history. He has never used smokeless tobacco. He reports current alcohol use of about 35.0 standard drinks of alcohol per week. He reports that he does not use drugs.   Physical Exam: BP 135/80    Pulse (!) 108     Ht 5\' 7"  (1.702 m)    Wt 193 lb (87.5 kg)    BMI 30.23 kg/m   Constitutional:  Alert and oriented, No acute distress. HEENT: [place] AT, moist mucus membranes.  Trachea midline, no masses. Cardiovascular: No clubbing, cyanosis, or edema. Respiratory: Normal respiratory effort, no increased work of breathing. GI: Abdomen is soft, nontender, nondistended, no abdominal masses Rectal: Normal sphincter tone, 50 g prostate, smooth no nodules/non tender Skin: No rashes, bruises or suspicious lesions. Neurologic: Grossly intact, no focal deficits, moving all 4 extremities. Psychiatric: Normal mood and affect.  Laboratory Data:  Lab Results  Component Value Date   CREATININE 0.84 04/24/2020    Lab Results  Component Value  Date   PSA 2.260 03/17/2020   PSA 1.81 09/30/2019   PSA 1.45 06/17/2019    Lab Results  Component Value Date   HGBA1C 5.8 03/17/2020    Urinalysis Pertinent Imaging: Results for orders placed or performed in visit on 04/22/20  Microscopic Examination   Urine  Result Value Ref Range   WBC, UA 0-5 0 - 5 /hpf   RBC 0-2 0 - 2 /hpf   Epithelial Cells (non renal) 0-10 0 - 10 /hpf   Casts Present (A) None seen /lpf   Cast Type Hyaline casts N/A   Bacteria, UA None seen None seen/Few  Urinalysis, Complete  Result Value Ref Range   Specific Gravity, UA 1.015 1.005 - 1.030   pH, UA 6.0 5.0 - 7.5   Color, UA Yellow Yellow   Appearance Ur Hazy (A) Clear   Leukocytes,UA Negative Negative   Protein,UA Negative Negative/Trace   Glucose, UA Negative Negative   Ketones, UA Negative Negative   RBC, UA Negative Negative   Bilirubin, UA Negative Negative   Urobilinogen, Ur 0.2 0.2 - 1.0 mg/dL   Nitrite, UA Negative Negative   Microscopic Examination See below:   Bladder Scan (Post Void Residual) in office  Result Value Ref Range   Scan Result 386      Assessment & Plan:    1. BPH with incomplete bladder emptying  Bladder scan by PVR 386 mL, likely subacute/  chronic low grade urinary retention  Likely related to prostate and this is acute and worsening.   I agree that patient should start Flomax 0.4 mg per PCP   We discussed alternatives including TURP, PVP and holmium laser enucleation of the prostate for large glands.  Minimally invasive options of UroLift and water vapor ablation were discussed. Differences between the surgical procedures were discussed as well as the risks and benefits of each.  He is most interested in Urolift.   Will schedule cystoscopy and TRUS prostate to see if he is a Urolift candidate.  All questions were answered and he desires to proceed  Warning signs for florid retention were reviewed in detail  2. PSA screening  PSA was 2.26 on 03/17/2020.  DRE is reassuring    [facility] [address] [place], [place] [address] [phone]  I, Selena Batten, am acting as a scribe for Dr. Hollice Espy.  I have reviewed the above documentation for accuracy and completeness, and I agree with the above.   Hollice Espy, MD  I spent 45 total minutes on the day of the encounter including pre-visit review of the medical record, face-to-face time with the patient, and post visit ordering of labs/imaging/tests.  Per patient's request, his case was discussed with his daughter, Talmage Coin.

## 2020-04-21 NOTE — Assessment & Plan Note (Signed)
Continued smoker. Only uses symbicort regularly, doesn't regularly take spiriva.

## 2020-04-21 NOTE — Progress Notes (Signed)
This visit was conducted in person.  BP 118/72   Pulse 96   Temp 98.6 F (37 C) (Temporal)   Ht 5\' 7"  (1.702 m)   Wt 190 lb (86.2 kg)   SpO2 98%   BMI 29.76 kg/m    CC: f/u urinary symtpoms Subjective:    Patient ID: [name], male    DOB: [birth year], 69 y.o.   MRN: [id number]  HPI: [name] is a 69 y.o. male presenting on 04/21/2020 for Polyuria (does not feel like he can empty bladder )   See prior note for details. 2 wks ago developed dysuria, pressure, incomplete emptying of bladder. Saw Dr [name] treated with Septra - UA showed large LE and blood, UCx returned growing strep agalactiae. After no significant improvement 2 days into abx course - ongoing dribbling and incomplete emptying - med changed to keflex 7d course. Dysuria now fully resolved, dribbling and incomplete emptying have continued. Ongoing frequency, urgency.   Denies fevers/chills, nausea/vomiting, flank pain, gross hematuria.  No constipation/diarrhea. Last BM yesterday afternoon, regular BMs daily.  He has urology appt scheduled for tomorrow [name]).   Recent commencement of eliquis for new onset afib found on preop eval prior to upcoming cervical fusion surgery planned for severe spinal stenosis at C1/2 with evidence of myelopathy. Also has severe lumbar spinal stenosis from L2/3 HNP. Metoprolol dose was also increased. No other new medications.   Worsening cervical spinal stenosis symptoms - progressive numbness to hands. Awaiting cardiac clearance prior to surgery - appt later this week.   COPD - on symbicort daily, spiriva and albuterol inh PRN. Continued smoker.      Relevant past medical, surgical, family and social history reviewed and updated as indicated. Interim medical history since our last visit reviewed. Allergies and medications reviewed and updated. Outpatient Medications Prior to Visit  Medication Sig Dispense Refill  . Albuterol Sulfate (PROAIR RESPICLICK) 416 (90 Base)  MCG/ACT AEPB Inhale 2 puffs into the lungs every 6 (six) hours as needed (cough, shortness of breath). 1 each 3  . apixaban (ELIQUIS) 5 MG TABS tablet Take 1 tablet (5 mg total) by mouth 2 (two) times daily. 60 tablet 5  . atorvastatin (LIPITOR) 10 MG tablet TAKE 1 TABLET(10 MG) BY MOUTH DAILY 90 tablet 3  . budesonide-formoterol (SYMBICORT) 80-4.5 MCG/ACT inhaler Inhale 1 puff into the lungs 2 (two) times daily.    [name] lisinopril (ZESTRIL) 10 MG tablet Take 0.5 tablets (5 mg total) by mouth daily. 30 tablet 3  . metoprolol tartrate (LOPRESSOR) 50 MG tablet Take 1 tablet (50 mg total) by mouth 2 (two) times daily. 60 tablet 5  . OVER THE COUNTER MEDICATION Take 1 capsule by mouth daily. Young Living Sulfurzyme (for healthy immune system and to boost liver function     . tiotropium (SPIRIVA) 18 MCG inhalation capsule Place 1 capsule (18 mcg total) into inhaler and inhale daily. 30 capsule 11  . cephALEXin (KEFLEX) 500 MG capsule Take 1 capsule (500 mg total) by mouth 2 (two) times daily. 14 capsule 0   No facility-administered medications prior to visit.     Per HPI unless specifically indicated in ROS section below Review of Systems Objective:  BP 118/72   Pulse 96   Temp 98.6 F (37 C) (Temporal)   Ht 5\' 7"  (1.702 m)   Wt 190 lb (86.2 kg)   SpO2 98%   BMI 29.76 kg/m   Wt Readings from Last 3 Encounters:  04/21/20 190  lb (86.2 kg)  04/07/20 198 lb 9 oz (90.1 kg)  03/30/20 193 lb 8 oz (87.8 kg)      Physical Exam Vitals and nursing note reviewed.  Constitutional:      Appearance: Normal appearance. He is obese. He is not ill-appearing.  Abdominal:     General: Bowel sounds are normal. There is no distension.     Palpations: Abdomen is soft. There is no mass.     Tenderness: There is abdominal tenderness (mild-mod) in the suprapubic area. There is no right CVA tenderness, left CVA tenderness, guarding or rebound. Negative signs include Murphy's sign.     Hernia: No hernia is  present. There is no hernia in the umbilical area.  Musculoskeletal:     Right lower leg: No edema.     Left lower leg: No edema.  Skin:    General: Skin is warm and dry.     Findings: No rash.  Neurological:     Mental Status: He is alert.  Psychiatric:        Mood and Affect: Mood normal.        Behavior: Behavior normal.       Results for orders placed or performed in visit on 04/21/20  POCT Urinalysis Dipstick (Automated)  Result Value Ref Range   Color, UA clear    Clarity, UA     Glucose, UA Negative Negative   Bilirubin, UA neg    Ketones, UA neg    Spec Grav, UA 1.015 1.010 - 1.025   Blood, UA neg    pH, UA 6.0 5.0 - 8.0   Protein, UA Negative Negative   Urobilinogen, UA 0.2 0.2 or 1.0 E.U./dL   Nitrite, UA neg    Leukocytes, UA Negative Negative   Lab Results  Component Value Date   PSA 2.260 03/17/2020   PSA 1.81 09/30/2019   PSA 1.45 06/17/2019   Assessment & Plan:  This visit occurred during the SARS-CoV-2 public health emergency.  Safety protocols were in place, including screening questions prior to the visit, additional usage of staff PPE, and extensive cleaning of exam room while observing appropriate contact time as indicated for disinfecting solutions.   Problem List Items Addressed This Visit    Tobacco abuse   Spinal stenosis    ?contributing to bladder symptoms.       Lumbar radiculopathy, chronic   Incomplete emptying of bladder - Primary    Dysuria has resolved after keflex course (UCx grew strep agalactiae) however notes persistent incomplete emptying, dribbling, frequency, urgency, and suprapubic pressure.  Fortunately has normal UA today with resolution of hematuria - pointing against ongoing infective cystitis, bladder cancer, etc.  ?BPH related LUTS - PSAs have been normal recently. ?symptoms related to known severe cervical and lumbar spinal stenosis. Will trial flomax today. Fortunately does have uro f/u already scheduled for tomorrow.  Will await their evaluation. DRE deferred today.       Relevant Orders   POCT Urinalysis Dipstick (Automated) (Completed)   COPD (chronic obstructive pulmonary disease) ([place])    Continued smoker. Only uses symbicort regularly, doesn't regularly take spiriva.        Other Visit Diagnoses    Need for immunization against influenza       Relevant Orders   Flu Vaccine QUAD High Dose(Fluad) (Completed)       Meds ordered this encounter  Medications  . tamsulosin (FLOMAX) 0.4 MG CAPS capsule    Sig: Take 1 capsule (0.4 mg total)  by mouth daily.    Dispense:  30 capsule    Refill:  3   Orders Placed This Encounter  Procedures  . Flu Vaccine QUAD High Dose(Fluad)  . POCT Urinalysis Dipstick (Automated)     Patient instructions: Flu shot today  Urinalysis today overall normal.  I'm glad you have urology appointment tomorrow for further evaluation of ongoing symptoms.  ?related to benign prostate enlargement - I'd like you to try flomax in evenings to help you void easier.   Follow up plan: No follow-ups on file.  Ria Bush, MD

## 2020-04-21 NOTE — Patient Instructions (Addendum)
Flu shot today  Urinalysis today overall normal.  I'm glad you have urology appointment tomorrow for further evaluation of ongoing symptoms.  ?related to benign prostate enlargement - I'd like you to try flomax in evenings to help you void easier.   Benign Prostatic Hyperplasia  Benign prostatic hyperplasia (BPH) is an enlarged prostate gland that is caused by the normal aging process and not by cancer. The prostate is a walnut-sized gland that is involved in the production of semen. It is located in front of the rectum and below the bladder. The bladder stores urine and the urethra is the tube that carries the urine out of the body. The prostate may get bigger as a man gets older. An enlarged prostate can press on the urethra. This can make it harder to pass urine. The build-up of urine in the bladder can cause infection. Back pressure and infection may progress to bladder damage and kidney (renal) failure. What are the causes? This condition is part of a normal aging process. However, not all men develop problems from this condition. If the prostate enlarges away from the urethra, urine flow will not be blocked. If it enlarges toward the urethra and compresses it, there will be problems passing urine. What increases the risk? This condition is more likely to develop in men over the age of 38 years. What are the signs or symptoms? Symptoms of this condition include:  Getting up often during the night to urinate.  Needing to urinate frequently during the day.  Difficulty starting urine flow.  Decrease in size and strength of your urine stream.  Leaking (dribbling) after urinating.  Inability to pass urine. This needs immediate treatment.  Inability to completely empty your bladder.  Pain when you pass urine. This is more common if there is also an infection.  Urinary tract infection (UTI). How is this diagnosed? This condition is diagnosed based on your medical history, a physical  exam, and your symptoms. Tests will also be done, such as:  A post-void bladder scan. This measures any amount of urine that may remain in your bladder after you finish urinating.  A digital rectal exam. In a rectal exam, your health care provider checks your prostate by putting a lubricated, gloved finger into your rectum to feel the back of your prostate gland. This exam detects the size of your gland and any abnormal lumps or growths.  An exam of your urine (urinalysis).  A prostate specific antigen (PSA) screening. This is a blood test used to screen for prostate cancer.  An ultrasound. This test uses sound waves to electronically produce a picture of your prostate gland. Your health care provider may refer you to a specialist in kidney and prostate diseases (urologist). How is this treated? Once symptoms begin, your health care provider will monitor your condition (active surveillance or watchful waiting). Treatment for this condition will depend on the severity of your condition. Treatment may include:  Observation and yearly exams. This may be the only treatment needed if your condition and symptoms are mild.  Medicines to relieve your symptoms, including: ? Medicines to shrink the prostate. ? Medicines to relax the muscle of the prostate.  Surgery in severe cases. Surgery may include: ? Prostatectomy. In this procedure, the prostate tissue is removed completely through an open incision or with a laparoscope or robotics. ? Transurethral resection of the prostate (TURP). In this procedure, a tool is inserted through the opening at the tip of the penis (urethra). It  is used to cut away tissue of the inner core of the prostate. The pieces are removed through the same opening of the penis. This removes the blockage. ? Transurethral incision (TUIP). In this procedure, small cuts are made in the prostate. This lessens the prostate's pressure on the urethra. ? Transurethral microwave  thermotherapy (TUMT). This procedure uses microwaves to create heat. The heat destroys and removes a small amount of prostate tissue. ? Transurethral needle ablation (TUNA). This procedure uses radio frequencies to destroy and remove a small amount of prostate tissue. ? Interstitial laser coagulation ([place]). This procedure uses a laser to destroy and remove a small amount of prostate tissue. ? Transurethral electrovaporization (TUVP). This procedure uses electrodes to destroy and remove a small amount of prostate tissue. ? Prostatic urethral lift. This procedure inserts an implant to push the lobes of the prostate away from the urethra. Follow these instructions at home:  Take over-the-counter and prescription medicines only as told by your health care provider.  Monitor your symptoms for any changes. Contact your health care provider with any changes.  Avoid drinking large amounts of liquid before going to bed or out in public.  Avoid or reduce how much caffeine or alcohol you drink.  Give yourself time when you urinate.  Keep all follow-up visits as told by your health care provider. This is important. Contact a health care provider if:  You have unexplained back pain.  Your symptoms do not get better with treatment.  You develop side effects from the medicine you are taking.  Your urine becomes very dark or has a bad smell.  Your lower abdomen becomes distended and you have trouble passing your urine. Get help right away if:  You have a fever or chills.  You suddenly cannot urinate.  You feel lightheaded, or very dizzy, or you faint.  There are large amounts of blood or clots in the urine.  Your urinary problems become hard to manage.  You develop moderate to severe low back or flank pain. The flank is the side of your body between the ribs and the hip. These symptoms may represent a serious problem that is an emergency. Do not wait to see if the symptoms will go away. Get  medical help right away. Call your local emergency services (911 in the [place] Do not drive yourself to the hospital. Summary  Benign prostatic hyperplasia (BPH) is an enlarged prostate that is caused by the normal aging process and not by cancer.  An enlarged prostate can press on the urethra. This can make it hard to pass urine.  This condition is part of a normal aging process and is more likely to develop in men over the age of 13 years.  Get help right away if you suddenly cannot urinate. This information is not intended to replace advice given to you by your health care provider. Make sure you discuss any questions you have with your health care provider. Document Revised: 05/29/2018 Document Reviewed: 08/08/2016 Elsevier Patient Education  [place].   Influenza (Flu) Vaccine (Inactivated or Recombinant): What You Need to Know 1. Why get vaccinated? Influenza vaccine can prevent influenza (flu). Flu is a contagious disease that spreads around the [place] every year, usually between October and May. Anyone can get the flu, but it is more dangerous for some people. Infants and young children, people 35 years of age and older, pregnant women, and people with certain health conditions or a weakened immune system are  at greatest risk of flu complications. Pneumonia, bronchitis, sinus infections and ear infections are examples of flu-related complications. If you have a medical condition, such as heart disease, cancer or diabetes, flu can make it worse. Flu can cause fever and chills, sore throat, muscle aches, fatigue, cough, headache, and runny or stuffy nose. Some people may have vomiting and diarrhea, though this is more common in children than adults. Each year thousands of people in the [place] die from flu, and many more are hospitalized. Flu vaccine prevents millions of illnesses and flu-related visits to the doctor each year. 2. Influenza vaccine CDC recommends  everyone 27 months of age and older get vaccinated every flu season. Children 6 months through 86 years of age may need 2 doses during a single flu season. Everyone else needs only 1 dose each flu season. It takes about 2 weeks for protection to develop after vaccination. There are many flu viruses, and they are always changing. Each year a new flu vaccine is made to protect against three or four viruses that are likely to cause disease in the upcoming flu season. Even when the vaccine doesn't exactly match these viruses, it may still provide some protection. Influenza vaccine does not cause flu. Influenza vaccine may be given at the same time as other vaccines. 3. Talk with your health care provider Tell your vaccine provider if the person getting the vaccine:  Has had an allergic reaction after a previous dose of influenza vaccine, or has any severe, life-threatening allergies.  Has ever had Guillain-Barr Syndrome (also called GBS). In some cases, your health care provider may decide to postpone influenza vaccination to a future visit. People with minor illnesses, such as a cold, may be vaccinated. People who are moderately or severely ill should usually wait until they recover before getting influenza vaccine. Your health care provider can give you more information. 4. Risks of a vaccine reaction  Soreness, redness, and swelling where shot is given, fever, muscle aches, and headache can happen after influenza vaccine.  There may be a very small increased risk of Guillain-Barr Syndrome (GBS) after inactivated influenza vaccine (the flu shot). Young children who get the flu shot along with pneumococcal vaccine (PCV13), and/or DTaP vaccine at the same time might be slightly more likely to have a seizure caused by fever. Tell your health care provider if a child who is getting flu vaccine has ever had a seizure. People sometimes faint after medical procedures, including vaccination. Tell your  provider if you feel dizzy or have vision changes or ringing in the ears. As with any medicine, there is a very remote chance of a vaccine causing a severe allergic reaction, other serious injury, or death. 5. What if there is a serious problem? An allergic reaction could occur after the vaccinated person leaves the clinic. If you see signs of a severe allergic reaction (hives, swelling of the face and throat, difficulty breathing, a fast heartbeat, dizziness, or weakness), call 9-1-1 and get the person to the nearest hospital. For other signs that concern you, call your health care provider. Adverse reactions should be reported to the Vaccine Adverse Event Reporting System (VAERS). Your health care provider will usually file this report, or you can do it yourself. Visit the VAERS website at www.vaers.SamedayNews.es or call 225-064-6871.VAERS is only for reporting reactions, and VAERS staff do not give medical advice. 6. The National Vaccine Injury Compensation Program The [employer] Vaccine Injury Compensation Program (VICP) is a [employer] that  was created to compensate people who may have been injured by certain vaccines. Visit the VICP website at GoldCloset.com.ee or call 804-403-7765 to learn about the program and about filing a claim. There is a time limit to file a claim for compensation. 7. How can I learn more?  Ask your healthcare provider.  Call your local or state health department.  Contact the Centers for Disease Control and Prevention (CDC): ? Call 804-671-0250 (1-800-CDC-INFO) or ? Visit CDC's https://gibson.com/ Vaccine Information Statement (Interim) Inactivated Influenza Vaccine (03/01/2018) This information is not intended to replace advice given to you by your health care provider. Make sure you discuss any questions you have with your health care provider. Document Revised: 10/23/2018 Document Reviewed: 03/05/2018 Elsevier Patient Education  [place].

## 2020-04-21 NOTE — Assessment & Plan Note (Signed)
?  contributing to bladder symptoms.

## 2020-04-21 NOTE — Assessment & Plan Note (Addendum)
Dysuria has resolved after keflex course (UCx grew strep agalactiae) however notes persistent incomplete emptying, dribbling, frequency, urgency, and suprapubic pressure.  Fortunately has normal UA today with resolution of hematuria - pointing against ongoing infective cystitis, bladder cancer, etc.  ?BPH related LUTS - PSAs have been normal recently. ?symptoms related to known severe cervical and lumbar spinal stenosis. Will trial flomax today. Fortunately does have uro f/u already scheduled for tomorrow. Will await their evaluation. DRE deferred today.

## 2020-04-22 ENCOUNTER — Ambulatory Visit (INDEPENDENT_AMBULATORY_CARE_PROVIDER_SITE_OTHER): Payer: Medicare Other | Admitting: Urology

## 2020-04-22 VITALS — BP 135/80 | HR 108 | Ht 67.0 in | Wt 193.0 lb

## 2020-04-22 DIAGNOSIS — N401 Enlarged prostate with lower urinary tract symptoms: Secondary | ICD-10-CM | POA: Diagnosis not present

## 2020-04-22 DIAGNOSIS — I2510 Atherosclerotic heart disease of native coronary artery without angina pectoris: Secondary | ICD-10-CM | POA: Diagnosis not present

## 2020-04-22 DIAGNOSIS — R339 Retention of urine, unspecified: Secondary | ICD-10-CM | POA: Diagnosis not present

## 2020-04-22 DIAGNOSIS — R3914 Feeling of incomplete bladder emptying: Secondary | ICD-10-CM | POA: Diagnosis not present

## 2020-04-22 LAB — URINALYSIS, COMPLETE
Bilirubin, UA: NEGATIVE
Glucose, UA: NEGATIVE
Ketones, UA: NEGATIVE
Leukocytes,UA: NEGATIVE
Nitrite, UA: NEGATIVE
Protein,UA: NEGATIVE
RBC, UA: NEGATIVE
Specific Gravity, UA: 1.015 (ref 1.005–1.030)
Urobilinogen, Ur: 0.2 mg/dL (ref 0.2–1.0)
pH, UA: 6 (ref 5.0–7.5)

## 2020-04-22 LAB — BLADDER SCAN AMB NON-IMAGING: Scan Result: 386

## 2020-04-22 LAB — MICROSCOPIC EXAMINATION: Bacteria, UA: NONE SEEN

## 2020-04-24 ENCOUNTER — Other Ambulatory Visit: Payer: Self-pay | Admitting: Neurosurgery

## 2020-04-24 ENCOUNTER — Other Ambulatory Visit: Payer: Self-pay

## 2020-04-24 ENCOUNTER — Encounter: Payer: Self-pay | Admitting: Cardiology

## 2020-04-24 ENCOUNTER — Ambulatory Visit (INDEPENDENT_AMBULATORY_CARE_PROVIDER_SITE_OTHER): Payer: Medicare Other | Admitting: Cardiology

## 2020-04-24 VITALS — BP 112/78 | HR 88 | Ht 67.0 in | Wt 192.0 lb

## 2020-04-24 DIAGNOSIS — Z01818 Encounter for other preprocedural examination: Secondary | ICD-10-CM | POA: Diagnosis not present

## 2020-04-24 DIAGNOSIS — I2510 Atherosclerotic heart disease of native coronary artery without angina pectoris: Secondary | ICD-10-CM

## 2020-04-24 DIAGNOSIS — F17200 Nicotine dependence, unspecified, uncomplicated: Secondary | ICD-10-CM | POA: Diagnosis not present

## 2020-04-24 DIAGNOSIS — I10 Essential (primary) hypertension: Secondary | ICD-10-CM | POA: Diagnosis not present

## 2020-04-24 DIAGNOSIS — I4891 Unspecified atrial fibrillation: Secondary | ICD-10-CM | POA: Diagnosis not present

## 2020-04-24 MED ORDER — AMIODARONE HCL 200 MG PO TABS: ORAL_TABLET | ORAL | 0 refills | Status: DC

## 2020-04-24 NOTE — Progress Notes (Signed)
Cardiology Office Note:    Date:  04/24/2020   ID:  [name], DOB [birth year], MRN [id number]  PCP:  Ria Bush, MD  [facility] Cardiologist:  Kate Sable, MD  [place] Electrophysiologist:  None   Referring MD: Ria Bush, MD   Chief Complaint  Patient presents with  . Follow-up    Follow up, review Echo results. Medications verbally reviewed with patient.     History of Present Illness:    [name] is a 69 y.o. male with a hx of A. fib on Eliquis, nonobstructive CAD (LHC 2016 LAD 30%, RCA 20%), hypertension, hyperlipidemia, , COPD, current smoker x40+ years who presents presents for follow-up.  Patient was previously seen in for new onset atrial fibrillation.  He was started on Eliquis, Lopressor was increased to 50 mg twice daily.  Echocardiogram was ordered.  He denies any symptoms of palpitations, chest pain, shortness of breath.  His heart rates are usually in the 80s to 90s.  Scheduled to see urology for BPH management.  Has cervical spinal stenosis, laminectomy being planned.   Prior notes  patient with history of neck pains, back pains with radiculopathy.  Diagnosed with cervical spinal stenosis.  Laminectomy is being planned.  Saw primary care provider for preop exam, EKG noted to be in atrial fibrillation rapid ventricular response.  He was started on Eliquis and Lopressor.  He states his Eliquis was not approved by his insurance.  He denies any history of palpitations or chest pain.  .   Past Medical History:  Diagnosis Date  . Allergic rhinitis 07/1998  . Aortic atherosclerosis ([place]) 09/2016   by CT  . Arthritis    BACK AND RIGHT KNEE  . CAD (coronary artery disease) 09/2016   RCA, LAD by CT  . COPD (chronic obstructive pulmonary disease) ([place]) 07/1998   centrilobular emphysema (09/2016) w ongoing tobacco use, spirometry (02/2013)  . Dysrhythmia    PVC'S  . ETOH abuse   . Hepatitis    pos test once not now,  Positive   .  HTN (hypertension) 05/1998  . Hyperlipidemia 06/1996  . Lumbar spinal stenosis 03/2013   s/p laminectomy  . 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 Shortness of breath dyspnea    WITH EXERTION  . Smoker     Past Surgical History:  Procedure Laterality Date  . ANTERIOR CERVICAL DECOMP/DISCECTOMY FUSION  2003   C3-7  . BACK SURGERY  2007  . CARDIAC CATHETERIZATION N/A 07/01/2015   Procedure: Left Heart Cath and Coronary Angiography;  Surgeon: Wellington Hampshire, MD;  Location: [facility];  Service: Cardiovascular;  Laterality: N/A;  . COLONOSCOPY  09/2010   HPx2, diverticula, hemorrhoids, rpt 10 yrs [place])  . ETT  2010   normal, ABIs normal  . KNEE ARTHROSCOPY Right 11   cartlage, ligaments  . KNEE ARTHROSCOPY Left 84  . LUMBAR LAMINECTOMY/DECOMPRESSION MICRODISCECTOMY Left 03/27/2013   Procedure: LUMBAR LAMINECTOMY/DECOMPRESSION MICRODISCECTOMY 2 LEVELS;  Surgeon: Ophelia Charter, MD;  Location: [facility];  Service: Neurosurgery;  Laterality: Left;  LEFT Lumbar Three Four diskectomy with Lumbar Three-Four, Four-Five Laminectomy. L3/4 ahd L4/5 for LSS [name])   . SHOULDER ARTHROSCOPY Right 11  . spriometry  02/2013   COPD with some restriction thought to be obesity related ([place])  . TOTAL KNEE ARTHROPLASTY Right 06/27/2014   Procedure:  RIGHT TOTAL KNEE ARTHROPLASTY;  Surgeon: Mcarthur Rossetti, MD;  Location: [facility];  Service: Orthopedics;  Laterality: Right;  . US ECHOCARDIOGRAPHY  01/2013   WNL, mild diastolic dysfunction    Current Medications: Current Meds  Medication Sig  . Albuterol Sulfate (PROAIR RESPICLICK) 347 (90 Base) MCG/ACT AEPB Inhale 2 puffs into the lungs every 6 (six) hours as needed (cough, shortness of breath).  [name] apixaban (ELIQUIS) 5 MG TABS tablet Take 1 tablet (5 mg total) by mouth 2 (two) times daily.  [name] atorvastatin (LIPITOR) 10 MG  tablet TAKE 1 TABLET(10 MG) BY MOUTH DAILY  . budesonide-formoterol (SYMBICORT) 80-4.5 MCG/ACT inhaler Inhale 1 puff into the lungs 2 (two) times daily.  [name] lisinopril (ZESTRIL) 10 MG tablet Take 0.5 tablets (5 mg total) by mouth daily.  . metoprolol tartrate (LOPRESSOR) 50 MG tablet Take 1 tablet (50 mg total) by mouth 2 (two) times daily.  [name] OVER THE COUNTER MEDICATION Take 1 capsule by mouth daily. [place] Sulfurzyme (for healthy immune system and to boost liver function   . tamsulosin (FLOMAX) 0.4 MG CAPS capsule Take 1 capsule (0.4 mg total) by mouth daily.  [name] tiotropium (SPIRIVA) 18 MCG inhalation capsule Place 1 capsule (18 mcg total) into inhaler and inhale daily.     Allergies:   Chantix [varenicline]   Social History   Socioeconomic History  . Marital status: Married    Spouse name: Not on file  . Number of children: Not on file  . Years of education: Not on file  . Highest education level: Not on file  Occupational History  . Not on file  Tobacco Use  . Smoking status: Current Every Day Smoker    Packs/day: 0.50    Years: 40.00    Pack years: 20.00    Types: Cigarettes  . Smokeless tobacco: Never Used  Vaping Use  . Vaping Use: Never used  Substance and Sexual Activity  . Alcohol use: Yes    Alcohol/week: 35.0 standard drinks    Types: 35 Cans of beer per week    Comment: 4-5 beers a day  . Drug use: No  . Sexual activity: Not Currently  Other Topics Concern  . Not on file  Social History Narrative   Lives with wife. Has 1 son, 2 daughters.    Occupation: Psychologist, clinical.    Activity: no exercise   Diet: good water, fruits/vegetables daily      Screened positive for OSA at recent hospitalization (03/2013)   Social Determinants of Health   Financial Resource Strain: Low Risk   . Difficulty of Paying Living Expenses: Not hard at all  Food Insecurity: No Food Insecurity  . Worried About Charity fundraiser in the Last Year: Never true  . Ran Out of  Food in the Last Year: Never true  Transportation Needs: No Transportation Needs  . Lack of Transportation (Medical): No  . Lack of Transportation (Non-Medical): No  Physical Activity: Inactive  . Days of Exercise per Week: 0 days  . Minutes of Exercise per Session: 0 min  Stress: No Stress Concern Present  . Feeling of Stress : Not at all  Social Connections:   . Frequency of Communication with Friends and Family: Not on file  . Frequency of Social Gatherings with Friends and Family: Not on file  . Attends Religious Services: Not on file  . Active Member of Clubs or Organizations: Not on file  . Attends Archivist Meetings: Not on  file  . Marital Status: Not on file     Family History: The patient's family history includes Coronary artery disease in his father and mother; Diabetes in his father; Hypertension in his father and mother; Irregular heart beat in his brother; Maple syrup urine disease in his brother; Pneumonia in his sister; Stroke in his father and mother. There is no history of Cancer.  ROS:   Please see the history of present illness.     All other systems reviewed and are negative.  EKGs/Labs/Other Studies Reviewed:    The following studies were reviewed today:   EKG:  EKG is  ordered today.  The ekg ordered today demonstrates atrial fibrillation heart rate 84  Recent Labs: 03/17/2020: ALT 29 03/27/2020: BUN 15; Creat 0.99; Hemoglobin 14.1; Platelets 298; Potassium 4.4; Sodium 137; TSH 3.42  Recent Lipid Panel    Component Value Date/Time   CHOL 174 03/17/2020 0000   TRIG 35.0 09/30/2019 0804   HDL 121 (A) 03/17/2020 0000   CHOLHDL 2 09/30/2019 0804   VLDL 7.0 09/30/2019 0804   LDLCALC 47 03/17/2020 0000   LDLDIRECT 82.0 09/07/2016 0900    Physical Exam:    VS:  BP 112/78 (BP Location: Left Arm, Patient Position: Sitting, Cuff Size: Normal)   Pulse 88   Ht 5\' 7"  (1.702 m)   Wt 192 lb (87.1 kg)   SpO2 96%   BMI 30.07 kg/m     Wt Readings  from Last 3 Encounters:  04/24/20 192 lb (87.1 kg)  04/22/20 193 lb (87.5 kg)  04/21/20 190 lb (86.2 kg)     GEN:  Well nourished, well developed in no acute distress HEENT: Normal NECK: No JVD; No carotid bruits LYMPHATICS: No lymphadenopathy CARDIAC: Irregular irregular, no murmurs, rubs, gallops RESPIRATORY: Decreased breath sounds, no wheezing ABDOMEN: Soft, non-tender, non-distended MUSCULOSKELETAL:  No edema; No deformity  SKIN: Warm and dry NEUROLOGIC:  Alert and oriented x 3 PSYCHIATRIC:  Normal affect   ASSESSMENT:    1. Atrial fibrillation, unspecified type ([place])   2. Coronary artery disease involving native coronary artery of native heart without angina pectoris   3. Essential hypertension   4. Smoking   5. Pre-op evaluation    PLAN:    In order of problems listed above:  1. Patient with persistent atrial fibrillation.  CHA2DS2-VASc score 3(age, htn, vasc).  Echo with low normal EF of about 50%.  Continue Lopressor to 50 mg twice daily, Eliquis 5 mg twice daily.  Plan for DC cardioversion in the hospital.  He has been on uninterrupted Eliquis for at least 4 weeks.  Cardioversion is successful, will start patient on amiodarone for antiarrhythmic benefit. 2. History of nonobstructive CAD.  Asymptomatic with no chest pain.  Continue Eliquis, statin. 3. History of hypertension, BP controlled.  Continue lisinopril to 5 mg daily, Lopressor to 50 mg twice daily. 4. Cervical spinal stenosis, laminectomy being planned.  Low risk procedure.  Can undergo procedure from a cardiac perspective.  EF preserved at 50%.  Continue Lopressor, Eliquis as prescribed.  If Eliquis is held for procedure, restart as soon as possible after. 5. Patient is a current smoker, smoking cessation advised.  Follow-up after cardioversion.  Total encounter time 40 minutes  Greater than 50% was spent in counseling and coordination of care with the patient   Medication Adjustments/Labs and Tests  Ordered: Current medicines are reviewed at length with the patient today.  Concerns regarding medicines are outlined above.  Orders Placed This Encounter  Procedures  . Basic metabolic panel  . CBC  . EKG 12-Lead   Meds ordered this encounter  Medications  . amiodarone (PACERONE) 200 MG tablet    Sig: Take 2 tablets by mouth twice a day for 1 week, then take 1 tablet by mouth twice a day.    Dispense:  70 tablet    Refill:  0    Patient Instructions  Medication Instructions:  Your physician has recommended you make the following change in your medication:   START taking amiodarone (PACERONE) 200 MG tablet:  Take 2 tablets by mouth twice a day for 1 week, then take 1 tablet by mouth twice a day.  *If you need a refill on your cardiac medications before your next appointment, please call your pharmacy*   Lab Work: Drawn today If you have labs (blood work) drawn today and your tests are completely normal, you will receive your results only by: [name] MyChart Message (if you have MyChart) OR . A paper copy in the mail If you have any lab test that is abnormal or we need to change your treatment, we will call you to review the results.   Testing/Procedures:  You are scheduled for a Cardioversion on _______Wed 10/20/21_________ with Dr.___Agbor-Etang________ Please arrive at the [place] [facility] at ____0700_____ a.m. on the day of your procedure.  DIET INSTRUCTIONS:  Nothing to eat or drink after midnight except your medications with a small sip of water.         1) Labs: ____BMP, CBC_____drawn 10/8_________  2) Medications:  YOU MAY TAKE ALL of your remaining medications with a small amount of water.  3) Must have a responsible person to drive you home.  4) Bring a current list of your medications and current insurance cards.    If you have any questions after you get home, please call the office at [phone]   Follow-Up: At [facility], you and your health needs  are our priority.  As part of our continuing mission to provide you with exceptional heart care, we have created designated Provider Care Teams.  These Care Teams include your primary Cardiologist (physician) and Advanced Practice Providers (APPs -  Physician Assistants and Nurse Practitioners) who all work together to provide you with the care you need, when you need it.  We recommend signing up for the patient portal called "MyChart".  Sign up information is provided on this After Visit Summary.  MyChart is used to connect with patients for Virtual Visits (Telemedicine).  Patients are able to view lab/test results, encounter notes, upcoming appointments, etc.  Non-urgent messages can be sent to your provider as well.   To learn more about what you can do with MyChart, go to [email or web address].    Your next appointment:   Week of Nov. 3rd   The format for your next appointment:   In Person  Provider:   Kate Sable, MD   Other Instructions      Signed, Kate Sable, MD  04/24/2020 1:03 PM    [place]

## 2020-04-24 NOTE — H&P (View-Only) (Signed)
Cardiology Office Note:    Date:  04/24/2020   ID:  [name], DOB [birth year], MRN [id number]  PCP:  Ria Bush, MD  [facility] Cardiologist:  Kate Sable, MD  [place] Electrophysiologist:  None   Referring MD: Ria Bush, MD   Chief Complaint  Patient presents with  . Follow-up    Follow up, review Echo results. Medications verbally reviewed with patient.     History of Present Illness:    [name] is a 69 y.o. male with a hx of A. fib on Eliquis, nonobstructive CAD (LHC 2016 LAD 30%, RCA 20%), hypertension, hyperlipidemia, , COPD, current smoker x40+ years who presents presents for follow-up.  Patient was previously seen in for new onset atrial fibrillation.  He was started on Eliquis, Lopressor was increased to 50 mg twice daily.  Echocardiogram was ordered.  He denies any symptoms of palpitations, chest pain, shortness of breath.  His heart rates are usually in the 80s to 90s.  Scheduled to see urology for BPH management.  Has cervical spinal stenosis, laminectomy being planned.   Prior notes  patient with history of neck pains, back pains with radiculopathy.  Diagnosed with cervical spinal stenosis.  Laminectomy is being planned.  Saw primary care provider for preop exam, EKG noted to be in atrial fibrillation rapid ventricular response.  He was started on Eliquis and Lopressor.  He states his Eliquis was not approved by his insurance.  He denies any history of palpitations or chest pain.  .   Past Medical History:  Diagnosis Date  . Allergic rhinitis 07/1998  . Aortic atherosclerosis ([place]) 09/2016   by CT  . Arthritis    BACK AND RIGHT KNEE  . CAD (coronary artery disease) 09/2016   RCA, LAD by CT  . COPD (chronic obstructive pulmonary disease) ([place]) 07/1998   centrilobular emphysema (09/2016) w ongoing tobacco use, spirometry (02/2013)  . Dysrhythmia    PVC'S  . ETOH abuse   . Hepatitis    pos test once not now,  Positive   .  HTN (hypertension) 05/1998  . Hyperlipidemia 06/1996  . Lumbar spinal stenosis 03/2013   s/p laminectomy  . 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 Shortness of breath dyspnea    WITH EXERTION  . Smoker     Past Surgical History:  Procedure Laterality Date  . ANTERIOR CERVICAL DECOMP/DISCECTOMY FUSION  2003   C3-7  . BACK SURGERY  2007  . CARDIAC CATHETERIZATION N/A 07/01/2015   Procedure: Left Heart Cath and Coronary Angiography;  Surgeon: Wellington Hampshire, MD;  Location: [place] [facility];  Service: Cardiovascular;  Laterality: N/A;  . COLONOSCOPY  09/2010   HPx2, diverticula, hemorrhoids, rpt 10 yrs [place])  . ETT  2010   normal, ABIs normal  . KNEE ARTHROSCOPY Right 11   cartlage, ligaments  . KNEE ARTHROSCOPY Left 84  . LUMBAR LAMINECTOMY/DECOMPRESSION MICRODISCECTOMY Left 03/27/2013   Procedure: LUMBAR LAMINECTOMY/DECOMPRESSION MICRODISCECTOMY 2 LEVELS;  Surgeon: Ophelia Charter, MD;  Location: [place] [facility];  Service: Neurosurgery;  Laterality: Left;  LEFT Lumbar Three Four diskectomy with Lumbar Three-Four, Four-Five Laminectomy. L3/4 ahd L4/5 for LSS [name])   . SHOULDER ARTHROSCOPY Right 11  . spriometry  02/2013   COPD with some restriction thought to be obesity related ([place])  . TOTAL KNEE ARTHROPLASTY Right 06/27/2014   Procedure:  RIGHT TOTAL KNEE ARTHROPLASTY;  Surgeon: Mcarthur Rossetti, MD;  Location: [facility];  Service: Orthopedics;  Laterality: Right;  . US ECHOCARDIOGRAPHY  01/2013   WNL, mild diastolic dysfunction    Current Medications: Current Meds  Medication Sig  . Albuterol Sulfate (PROAIR RESPICLICK) 834 (90 Base) MCG/ACT AEPB Inhale 2 puffs into the lungs every 6 (six) hours as needed (cough, shortness of breath).  [name] apixaban (ELIQUIS) 5 MG TABS tablet Take 1 tablet (5 mg total) by mouth 2 (two) times daily.  [name] atorvastatin (LIPITOR) 10 MG  tablet TAKE 1 TABLET(10 MG) BY MOUTH DAILY  . budesonide-formoterol (SYMBICORT) 80-4.5 MCG/ACT inhaler Inhale 1 puff into the lungs 2 (two) times daily.  [name] lisinopril (ZESTRIL) 10 MG tablet Take 0.5 tablets (5 mg total) by mouth daily.  . metoprolol tartrate (LOPRESSOR) 50 MG tablet Take 1 tablet (50 mg total) by mouth 2 (two) times daily.  [name] OVER THE COUNTER MEDICATION Take 1 capsule by mouth daily. [place] Sulfurzyme (for healthy immune system and to boost liver function   . tamsulosin (FLOMAX) 0.4 MG CAPS capsule Take 1 capsule (0.4 mg total) by mouth daily.  [name] tiotropium (SPIRIVA) 18 MCG inhalation capsule Place 1 capsule (18 mcg total) into inhaler and inhale daily.     Allergies:   Chantix [varenicline]   Social History   Socioeconomic History  . Marital status: Married    Spouse name: Not on file  . Number of children: Not on file  . Years of education: Not on file  . Highest education level: Not on file  Occupational History  . Not on file  Tobacco Use  . Smoking status: Current Every Day Smoker    Packs/day: 0.50    Years: 40.00    Pack years: 20.00    Types: Cigarettes  . Smokeless tobacco: Never Used  Vaping Use  . Vaping Use: Never used  Substance and Sexual Activity  . Alcohol use: Yes    Alcohol/week: 35.0 standard drinks    Types: 35 Cans of beer per week    Comment: 4-5 beers a day  . Drug use: No  . Sexual activity: Not Currently  Other Topics Concern  . Not on file  Social History Narrative   Lives with wife. Has 1 son, 2 daughters.    Occupation: Psychologist, clinical.    Activity: no exercise   Diet: good water, fruits/vegetables daily      Screened positive for OSA at recent hospitalization (03/2013)   Social Determinants of Health   Financial Resource Strain: Low Risk   . Difficulty of Paying Living Expenses: Not hard at all  Food Insecurity: No Food Insecurity  . Worried About Charity fundraiser in the Last Year: Never true  . Ran Out of  Food in the Last Year: Never true  Transportation Needs: No Transportation Needs  . Lack of Transportation (Medical): No  . Lack of Transportation (Non-Medical): No  Physical Activity: Inactive  . Days of Exercise per Week: 0 days  . Minutes of Exercise per Session: 0 min  Stress: No Stress Concern Present  . Feeling of Stress : Not at all  Social Connections:   . Frequency of Communication with Friends and Family: Not on file  . Frequency of Social Gatherings with Friends and Family: Not on file  . Attends Religious Services: Not on file  . Active Member of Clubs or Organizations: Not on file  . Attends Archivist Meetings: Not on  file  . Marital Status: Not on file     Family History: The patient's family history includes Coronary artery disease in his father and mother; Diabetes in his father; Hypertension in his father and mother; Irregular heart beat in his brother; Maple syrup urine disease in his brother; Pneumonia in his sister; Stroke in his father and mother. There is no history of Cancer.  ROS:   Please see the history of present illness.     All other systems reviewed and are negative.  EKGs/Labs/Other Studies Reviewed:    The following studies were reviewed today:   EKG:  EKG is  ordered today.  The ekg ordered today demonstrates atrial fibrillation heart rate 84  Recent Labs: 03/17/2020: ALT 29 03/27/2020: BUN 15; Creat 0.99; Hemoglobin 14.1; Platelets 298; Potassium 4.4; Sodium 137; TSH 3.42  Recent Lipid Panel    Component Value Date/Time   CHOL 174 03/17/2020 0000   TRIG 35.0 09/30/2019 0804   HDL 121 (A) 03/17/2020 0000   CHOLHDL 2 09/30/2019 0804   VLDL 7.0 09/30/2019 0804   LDLCALC 47 03/17/2020 0000   LDLDIRECT 82.0 09/07/2016 0900    Physical Exam:    VS:  BP 112/78 (BP Location: Left Arm, Patient Position: Sitting, Cuff Size: Normal)   Pulse 88   Ht 5\' 7"  (1.702 m)   Wt 192 lb (87.1 kg)   SpO2 96%   BMI 30.07 kg/m     Wt Readings  from Last 3 Encounters:  04/24/20 192 lb (87.1 kg)  04/22/20 193 lb (87.5 kg)  04/21/20 190 lb (86.2 kg)     GEN:  Well nourished, well developed in no acute distress HEENT: Normal NECK: No JVD; No carotid bruits LYMPHATICS: No lymphadenopathy CARDIAC: Irregular irregular, no murmurs, rubs, gallops RESPIRATORY: Decreased breath sounds, no wheezing ABDOMEN: Soft, non-tender, non-distended MUSCULOSKELETAL:  No edema; No deformity  SKIN: Warm and dry NEUROLOGIC:  Alert and oriented x 3 PSYCHIATRIC:  Normal affect   ASSESSMENT:    1. Atrial fibrillation, unspecified type ([place])   2. Coronary artery disease involving native coronary artery of native heart without angina pectoris   3. Essential hypertension   4. Smoking   5. Pre-op evaluation    PLAN:    In order of problems listed above:  1. Patient with persistent atrial fibrillation.  CHA2DS2-VASc score 3(age, htn, vasc).  Echo with low normal EF of about 50%.  Continue Lopressor to 50 mg twice daily, Eliquis 5 mg twice daily.  Plan for DC cardioversion in the hospital.  He has been on uninterrupted Eliquis for at least 4 weeks.  Cardioversion is successful, will start patient on amiodarone for antiarrhythmic benefit. 2. History of nonobstructive CAD.  Asymptomatic with no chest pain.  Continue Eliquis, statin. 3. History of hypertension, BP controlled.  Continue lisinopril to 5 mg daily, Lopressor to 50 mg twice daily. 4. Cervical spinal stenosis, laminectomy being planned.  Low risk procedure.  Can undergo procedure from a cardiac perspective.  EF preserved at 50%.  Continue Lopressor, Eliquis as prescribed.  If Eliquis is held for procedure, restart as soon as possible after. 5. Patient is a current smoker, smoking cessation advised.  Follow-up after cardioversion.  Total encounter time 40 minutes  Greater than 50% was spent in counseling and coordination of care with the patient   Medication Adjustments/Labs and Tests  Ordered: Current medicines are reviewed at length with the patient today.  Concerns regarding medicines are outlined above.  Orders Placed This Encounter  Procedures  . Basic metabolic panel  . CBC  . EKG 12-Lead   Meds ordered this encounter  Medications  . amiodarone (PACERONE) 200 MG tablet    Sig: Take 2 tablets by mouth twice a day for 1 week, then take 1 tablet by mouth twice a day.    Dispense:  70 tablet    Refill:  0    Patient Instructions  Medication Instructions:  Your physician has recommended you make the following change in your medication:   START taking amiodarone (PACERONE) 200 MG tablet:  Take 2 tablets by mouth twice a day for 1 week, then take 1 tablet by mouth twice a day.  *If you need a refill on your cardiac medications before your next appointment, please call your pharmacy*   Lab Work: Drawn today If you have labs (blood work) drawn today and your tests are completely normal, you will receive your results only by: [name] MyChart Message (if you have MyChart) OR . A paper copy in the mail If you have any lab test that is abnormal or we need to change your treatment, we will call you to review the results.   Testing/Procedures:  You are scheduled for a Cardioversion on _______Wed 10/20/21_________ with Dr.___Agbor-Etang________ Please arrive at the [place] [facility] at ____0700_____ a.m. on the day of your procedure.  DIET INSTRUCTIONS:  Nothing to eat or drink after midnight except your medications with a small sip of water.         1) Labs: ____BMP, CBC_____drawn 10/8_________  2) Medications:  YOU MAY TAKE ALL of your remaining medications with a small amount of water.  3) Must have a responsible person to drive you home.  4) Bring a current list of your medications and current insurance cards.    If you have any questions after you get home, please call the office at [phone]   Follow-Up: At [facility], you and your health needs  are our priority.  As part of our continuing mission to provide you with exceptional heart care, we have created designated Provider Care Teams.  These Care Teams include your primary Cardiologist (physician) and Advanced Practice Providers (APPs -  Physician Assistants and Nurse Practitioners) who all work together to provide you with the care you need, when you need it.  We recommend signing up for the patient portal called "MyChart".  Sign up information is provided on this After Visit Summary.  MyChart is used to connect with patients for Virtual Visits (Telemedicine).  Patients are able to view lab/test results, encounter notes, upcoming appointments, etc.  Non-urgent messages can be sent to your provider as well.   To learn more about what you can do with MyChart, go to [email or web address].    Your next appointment:   Week of Nov. 3rd   The format for your next appointment:   In Person  Provider:   Kate Sable, MD   Other Instructions      Signed, Kate Sable, MD  04/24/2020 1:03 PM    [place]

## 2020-04-24 NOTE — Patient Instructions (Signed)
Medication Instructions:  Your physician has recommended you make the following change in your medication:   START taking amiodarone (PACERONE) 200 MG tablet:  Take 2 tablets by mouth twice a day for 1 week, then take 1 tablet by mouth twice a day.  *If you need a refill on your cardiac medications before your next appointment, please call your pharmacy*   Lab Work: Drawn today If you have labs (blood work) drawn today and your tests are completely normal, you will receive your results only by: [name] MyChart Message (if you have MyChart) OR . A paper copy in the mail If you have any lab test that is abnormal or we need to change your treatment, we will call you to review the results.   Testing/Procedures:  You are scheduled for a Cardioversion on _______Wed 10/20/21_________ with Dr.___Agbor-Etang________ Please arrive at the [place] [facility] at ____0700_____ a.m. on the day of your procedure.  DIET INSTRUCTIONS:  Nothing to eat or drink after midnight except your medications with a small sip of water.         1) Labs: ____BMP, CBC_____drawn 10/8_________  2) Medications:  YOU MAY TAKE ALL of your remaining medications with a small amount of water.  3) Must have a responsible person to drive you home.  4) Bring a current list of your medications and current insurance cards.    If you have any questions after you get home, please call the office at [phone]   Follow-Up: At [facility], you and your health needs are our priority.  As part of our continuing mission to provide you with exceptional heart care, we have created designated Provider Care Teams.  These Care Teams include your primary Cardiologist (physician) and Advanced Practice Providers (APPs -  Physician Assistants and Nurse Practitioners) who all work together to provide you with the care you need, when you need it.  We recommend signing up for the patient portal called "MyChart".  Sign up information is  provided on this After Visit Summary.  MyChart is used to connect with patients for Virtual Visits (Telemedicine).  Patients are able to view lab/test results, encounter notes, upcoming appointments, etc.  Non-urgent messages can be sent to your provider as well.   To learn more about what you can do with MyChart, go to [email or web address].    Your next appointment:   Week of Nov. 3rd   The format for your next appointment:   In Person  Provider:   Kate Sable, MD   Other Instructions

## 2020-04-25 LAB — CBC
Hematocrit: 37.5 % (ref 37.5–51.0)
Hemoglobin: 13.4 g/dL (ref 13.0–17.7)
MCH: 34.5 pg — ABNORMAL HIGH (ref 26.6–33.0)
MCHC: 35.7 g/dL (ref 31.5–35.7)
MCV: 97 fL (ref 79–97)
Platelets: 235 10*3/uL (ref 150–450)
RBC: 3.88 x10E6/uL — ABNORMAL LOW (ref 4.14–5.80)
RDW: 12 % (ref 11.6–15.4)
WBC: 4.4 10*3/uL (ref 3.4–10.8)

## 2020-04-25 LAB — BASIC METABOLIC PANEL
BUN/Creatinine Ratio: 11 (ref 10–24)
BUN: 9 mg/dL (ref 8–27)
CO2: 22 mmol/L (ref 20–29)
Calcium: 9.5 mg/dL (ref 8.6–10.2)
Chloride: 98 mmol/L (ref 96–106)
Creatinine, Ser: 0.84 mg/dL (ref 0.76–1.27)
GFR calc Af Amer: 104 mL/min/{1.73_m2} (ref >59)
GFR calc non Af Amer: 90 mL/min/{1.73_m2} (ref >59)
Glucose: 111 mg/dL — ABNORMAL HIGH (ref 65–99)
Potassium: 5.1 mmol/L (ref 3.5–5.2)
Sodium: 134 mmol/L (ref 134–144)

## 2020-04-27 ENCOUNTER — Other Ambulatory Visit: Payer: Self-pay | Admitting: Neurosurgery

## 2020-04-28 NOTE — Progress Notes (Signed)
04/29/2020  Chief Complaint  Patient presents with   Cysto    TRUS     EUM:PNTIRW C Difrancesco is a 69 y.o. male who returns today for a cystoscopy and TRUS.   Please see previous notes for details.    Flomax has significantly improved his symptoms. He is urinating a lot better. He does not like the retrograde ejaculation.   Scheduled for cardioversion next week for A. fib.  He has upcoming back surgery with Dr. [name] in [place], [place].   Blood pressure (!) 155/95, pulse 85. NED. A&Ox3.   No respiratory distress   Abd soft, NT, ND Normal phallus with bilateral descended testicles    Cystoscopy Procedure Note  Patient identification was confirmed, informed consent was obtained, and patient was prepped using Betadine solution.  Lidocaine jelly was administered per urethral meatus.    Preoperative abx where received prior to procedure.     Pre-Procedure: - Inspection reveals a normal caliber ureteral meatus.  Procedure: The flexible cystoscope was introduced without difficulty - No urethral strictures/lesions are present. - Mildly enlarged prostate bilobar coaptation  - Normal bladder neck - Bilateral ureteral orifices identified - Bladder mucosa  reveals no ulcers, tumors, or lesions - No bladder stones - No trabeculation  Retroflexion shows no abnormalities.    Post-Procedure: - Patient tolerated the procedure well    Prostate transrectal ultrasound sizing   Informed consent was obtained after discussing risks/benefits of the procedure.  A time out was performed to ensure correct patient identity.   Pre-Procedure: -Transrectal probe was placed without difficulty -Transrectal Ultrasound performed revealing a 33.58 gm prostate measuring 3.13 x 4.26 x 4.81 cm (length) -No significant hypoechoic or median lobe noted    Pertinent image See media  Results for orders placed or performed in visit on 04/29/20  Microscopic Examination   Urine  Result Value Ref  Range   WBC, UA 0-5 0 - 5 /hpf   RBC 0-2 0 - 2 /hpf   Epithelial Cells (non renal) None seen 0 - 10 /hpf   Bacteria, UA None seen None seen/Few  Urinalysis, Complete  Result Value Ref Range   Specific Gravity, UA 1.015 1.005 - 1.030   pH, UA 7.0 5.0 - 7.5   Color, UA Yellow Yellow   Appearance Ur Clear Clear   Leukocytes,UA Negative Negative   Protein,UA Negative Negative/Trace   Glucose, UA Negative Negative   Ketones, UA Negative Negative   RBC, UA Trace (A) Negative   Bilirubin, UA Negative Negative   Urobilinogen, Ur 0.2 0.2 - 1.0 mg/dL   Nitrite, UA Negative Negative   Microscopic Examination See below:   BLADDER SCAN AMB NON-IMAGING  Result Value Ref Range   Scan Result 240 ML       Assessment/ Plan:  1.  BPH with incomplete bladder emptying Continue Flomax  Symptoms improving on Flomax but he continues to emptying albeit slightly improved from last visit Strongly recommend consideration of outlet procedure- high risk for acute retention The patient is a good candidate for Urolift. Patient provided with literature.  Will hold off on Urolift for now given tricky timing. He is need of a cardiac evaluation for his a. Fib and upcoming cervical spine surgery.  RTC in 06/2020 for MD assessment.    Fransico Him, am acting as a scribe for Dr. Hollice Espy.  I have reviewed the above documentation for accuracy and completeness, and I agree with the above.   Hollice Espy, MD

## 2020-04-29 ENCOUNTER — Other Ambulatory Visit: Payer: Self-pay

## 2020-04-29 ENCOUNTER — Ambulatory Visit (INDEPENDENT_AMBULATORY_CARE_PROVIDER_SITE_OTHER): Payer: Medicare Other | Admitting: Urology

## 2020-04-29 ENCOUNTER — Other Ambulatory Visit: Payer: Self-pay | Admitting: Radiology

## 2020-04-29 VITALS — BP 155/95 | HR 85

## 2020-04-29 DIAGNOSIS — R339 Retention of urine, unspecified: Secondary | ICD-10-CM | POA: Diagnosis not present

## 2020-04-29 LAB — BLADDER SCAN AMB NON-IMAGING: Scan Result: 240

## 2020-04-30 LAB — URINALYSIS, COMPLETE
Bilirubin, UA: NEGATIVE
Glucose, UA: NEGATIVE
Ketones, UA: NEGATIVE
Leukocytes,UA: NEGATIVE
Nitrite, UA: NEGATIVE
Protein,UA: NEGATIVE
Specific Gravity, UA: 1.015 (ref 1.005–1.030)
Urobilinogen, Ur: 0.2 mg/dL (ref 0.2–1.0)
pH, UA: 7 (ref 5.0–7.5)

## 2020-04-30 LAB — MICROSCOPIC EXAMINATION
Bacteria, UA: NONE SEEN
Epithelial Cells (non renal): NONE SEEN /hpf (ref 0–10)

## 2020-05-04 ENCOUNTER — Other Ambulatory Visit
Admission: RE | Admit: 2020-05-04 | Discharge: 2020-05-04 | Disposition: A | Discharge status: Home / Self Care | Payer: Medicare Other | Source: Ambulatory Visit | Attending: Cardiology | Admitting: Cardiology

## 2020-05-04 ENCOUNTER — Other Ambulatory Visit: Payer: Self-pay

## 2020-05-04 DIAGNOSIS — Z20822 Contact with and (suspected) exposure to covid-19: Secondary | ICD-10-CM | POA: Diagnosis not present

## 2020-05-04 DIAGNOSIS — Z01818 Encounter for other preprocedural examination: Secondary | ICD-10-CM | POA: Insufficient documentation

## 2020-05-04 LAB — SARS CORONAVIRUS 2 (TAT 6-24 HRS): SARS Coronavirus 2: NEGATIVE

## 2020-05-05 MED ORDER — CEFAZOLIN SODIUM-DEXTROSE 2-4 GM/100ML-% IV SOLN: 2.0000 g | INTRAVENOUS | Status: DC

## 2020-05-06 ENCOUNTER — Other Ambulatory Visit: Payer: Self-pay

## 2020-05-06 ENCOUNTER — Other Ambulatory Visit: Payer: Self-pay | Admitting: *Deleted

## 2020-05-06 ENCOUNTER — Encounter: Payer: Self-pay | Admitting: Cardiology

## 2020-05-06 ENCOUNTER — Encounter
Admission: RE | Disposition: A | Discharge status: Home / Self Care | Payer: Self-pay | Source: Home / Self Care | Attending: Cardiology

## 2020-05-06 ENCOUNTER — Ambulatory Visit
Admission: RE | Admit: 2020-05-06 | Discharge: 2020-05-06 | Disposition: A | Discharge status: Home / Self Care | Payer: Medicare Other | Attending: Cardiology | Admitting: Cardiology

## 2020-05-06 ENCOUNTER — Ambulatory Visit: Payer: Medicare Other | Admitting: Anesthesiology

## 2020-05-06 DIAGNOSIS — I700 Atherosclerosis of aorta: Secondary | ICD-10-CM | POA: Diagnosis not present

## 2020-05-06 DIAGNOSIS — I739 Peripheral vascular disease, unspecified: Secondary | ICD-10-CM | POA: Insufficient documentation

## 2020-05-06 DIAGNOSIS — I2510 Atherosclerotic heart disease of native coronary artery without angina pectoris: Secondary | ICD-10-CM | POA: Insufficient documentation

## 2020-05-06 DIAGNOSIS — F17210 Nicotine dependence, cigarettes, uncomplicated: Secondary | ICD-10-CM | POA: Diagnosis not present

## 2020-05-06 DIAGNOSIS — G4730 Sleep apnea, unspecified: Secondary | ICD-10-CM | POA: Insufficient documentation

## 2020-05-06 DIAGNOSIS — E785 Hyperlipidemia, unspecified: Secondary | ICD-10-CM | POA: Diagnosis not present

## 2020-05-06 DIAGNOSIS — J432 Centrilobular emphysema: Secondary | ICD-10-CM | POA: Diagnosis not present

## 2020-05-06 DIAGNOSIS — Z96651 Presence of right artificial knee joint: Secondary | ICD-10-CM | POA: Diagnosis not present

## 2020-05-06 DIAGNOSIS — Z79899 Other long term (current) drug therapy: Secondary | ICD-10-CM | POA: Insufficient documentation

## 2020-05-06 DIAGNOSIS — Z7951 Long term (current) use of inhaled steroids: Secondary | ICD-10-CM | POA: Diagnosis not present

## 2020-05-06 DIAGNOSIS — Z7901 Long term (current) use of anticoagulants: Secondary | ICD-10-CM | POA: Diagnosis not present

## 2020-05-06 DIAGNOSIS — J449 Chronic obstructive pulmonary disease, unspecified: Secondary | ICD-10-CM | POA: Diagnosis not present

## 2020-05-06 DIAGNOSIS — I4891 Unspecified atrial fibrillation: Secondary | ICD-10-CM

## 2020-05-06 DIAGNOSIS — I4819 Other persistent atrial fibrillation: Secondary | ICD-10-CM | POA: Diagnosis not present

## 2020-05-06 DIAGNOSIS — I10 Essential (primary) hypertension: Secondary | ICD-10-CM | POA: Diagnosis not present

## 2020-05-06 DIAGNOSIS — M4802 Spinal stenosis, cervical region: Secondary | ICD-10-CM | POA: Diagnosis not present

## 2020-05-06 HISTORY — PX: CARDIOVERSION: SHX1299

## 2020-05-06 SURGERY — CARDIOVERSION
Anesthesia: General

## 2020-05-06 MED ORDER — PROPOFOL 10 MG/ML IV BOLUS
INTRAVENOUS | Status: DC | PRN
  Administered 2020-05-06: 60 mg via INTRAVENOUS
  Administered 2020-05-06 (×2): 20 mg via INTRAVENOUS

## 2020-05-06 MED ORDER — SODIUM CHLORIDE 0.9 % IV SOLN: INTRAVENOUS | Status: DC | PRN

## 2020-05-06 NOTE — Procedures (Signed)
Cardioversion procedure note For atrial fibrillation.  Procedure Details:  Consent: Risks of procedure as well as the alternatives and risks of each were explained to the (patient/caregiver).  Consent for procedure obtained.  Time Out: Verified patient identification, verified procedure, site/side was marked, verified correct patient position, special equipment/implants available, medications/allergies/relevent history reviewed, required imaging and test results available.  Performed  Patient placed on cardiac monitor, pulse oximetry, supplemental oxygen as necessary.   Sedation given: per anesethesia team Pacer pads placed anterior and posterior chest.   Cardioverted 2 time(s).   Cardioverted at  [place]. Synchronized biphasic Unsuccessful cardioversion attempt   Evaluation: Findings: Post procedure EKG shows: atrial fibrillation HR 74 Complications: None Patient did tolerate procedure well.  Time Spent Directly with the Patient:  35 minutes   Kate Sable, M.D.

## 2020-05-06 NOTE — Transfer of Care (Signed)
Immediate Anesthesia Transfer of Care Note  Patient: ZEV BLUE  Procedure(s) Performed: CARDIOVERSION (N/A )  Patient Location: Cath Lab  Anesthesia Type:General  Level of Consciousness: awake, alert , oriented and patient cooperative  Airway & Oxygen Therapy: Patient Spontanous Breathing  Post-op Assessment: Report given to RN and Post -op Vital signs reviewed and stable  Post vital signs: Reviewed and stable  Last Vitals:  Vitals Value Taken Time  BP 123/85 05/06/20 0808  Temp    Pulse 66 05/06/20 0810  Resp 25 05/06/20 0810  SpO2 94 % 05/06/20 0810    Last Pain:  Vitals:   05/06/20 0740  PainSc: 0-No pain         Complications: No complications documented.

## 2020-05-06 NOTE — Discharge Instructions (Signed)
Electrical Cardioversion Electrical cardioversion is the delivery of a jolt of electricity to restore a normal rhythm to the heart. A rhythm that is too fast or is not regular keeps the heart from pumping well. In this procedure, sticky patches or metal paddles are placed on the chest to deliver electricity to the heart from a device. This procedure may be done in an emergency if:  There is low or no blood pressure as a result of the heart rhythm.  Normal rhythm must be restored as fast as possible to protect the brain and heart from further damage.  It may save a life. This may also be a scheduled procedure for irregular or fast heart rhythms that are not immediately life-threatening. Tell a health care provider about:  Any allergies you have.  All medicines you are taking, including vitamins, herbs, eye drops, creams, and over-the-counter medicines.  Any problems you or family members have had with anesthetic medicines.  Any blood disorders you have.  Any surgeries you have had.  Any medical conditions you have.  Whether you are pregnant or may be pregnant. What are the risks? Generally, this is a safe procedure. However, problems may occur, including:  Allergic reactions to medicines.  A blood clot that breaks free and travels to other parts of your body.  The possible return of an abnormal heart rhythm within hours or days after the procedure.  Your heart stopping (cardiac arrest). This is rare. What happens before the procedure? Medicines  Your health care provider may have you start taking: ? Blood-thinning medicines (anticoagulants) so your blood does not clot as easily. ? Medicines to help stabilize your heart rate and rhythm.  Ask your health care provider about: ? Changing or stopping your regular medicines. This is especially important if you are taking diabetes medicines or blood thinners. ? Taking medicines such as aspirin and ibuprofen. These medicines can  thin your blood. Do not take these medicines unless your health care provider tells you to take them. ? Taking over-the-counter medicines, vitamins, herbs, and supplements. General instructions  Follow instructions from your health care provider about eating or drinking restrictions.  Plan to have someone take you home from the hospital or clinic.  If you will be going home right after the procedure, plan to have someone with you for 24 hours.  Ask your health care provider what steps will be taken to help prevent infection. These may include washing your skin with a germ-killing soap. What happens during the procedure?   An IV will be inserted into one of your veins.  Sticky patches (electrodes) or metal paddles may be placed on your chest.  You will be given a medicine to help you relax (sedative).  An electrical shock will be delivered. The procedure may vary among health care providers and hospitals. What can I expect after the procedure?  Your blood pressure, heart rate, breathing rate, and blood oxygen level will be monitored until you leave the hospital or clinic.  Your heart rhythm will be watched to make sure it does not change.  You may have some redness on the skin where the shocks were given. Follow these instructions at home:  Do not drive for 24 hours if you were given a sedative during your procedure.  Take over-the-counter and prescription medicines only as told by your health care provider.  Ask your health care provider how to check your pulse. Check it often.  Rest for 48 hours after the procedure or   as told by your health care provider.  Avoid or limit your caffeine use as told by your health care provider.  Keep all follow-up visits as told by your health care provider. This is important. Contact a health care provider if:  You feel like your heart is beating too quickly or your pulse is not regular.  You have a serious muscle cramp that does not go  away. Get help right away if:  You have discomfort in your chest.  You are dizzy or you feel faint.  You have trouble breathing or you are short of breath.  Your speech is slurred.  You have trouble moving an arm or leg on one side of your body.  Your fingers or toes turn cold or blue. Summary  Electrical cardioversion is the delivery of a jolt of electricity to restore a normal rhythm to the heart.  This procedure may be done right away in an emergency or may be a scheduled procedure if the condition is not an emergency.  Generally, this is a safe procedure.  After the procedure, check your pulse often as told by your health care provider. This information is not intended to replace advice given to you by your health care provider. Make sure you discuss any questions you have with your health care provider. Document Revised: 02/04/2019 Document Reviewed: 02/04/2019 Elsevier Patient Education  2020 [employer]  

## 2020-05-06 NOTE — Anesthesia Postprocedure Evaluation (Signed)
Anesthesia Post Note  Patient: [name]  Procedure(s) Performed: CARDIOVERSION (N/A )  Patient location during evaluation: Cath Lab Anesthesia Type: General Level of consciousness: awake and alert Pain management: pain level controlled Vital Signs Assessment: post-procedure vital signs reviewed and stable Respiratory status: spontaneous breathing, nonlabored ventilation, respiratory function stable and patient connected to nasal cannula oxygen Cardiovascular status: blood pressure returned to baseline and stable Postop Assessment: no apparent nausea or vomiting Anesthetic complications: no   No complications documented.   Last Vitals:  Vitals:   05/06/20 0810 05/06/20 0824  BP:  123/77  Pulse: 66 80  Resp: (!) 25 (!) 21  Temp:  37.1 C  SpO2: 94%     Last Pain:  Vitals:   05/06/20 0824  PainSc: 0-No pain                 Precious Haws Philicia Heyne

## 2020-05-06 NOTE — Anesthesia Preprocedure Evaluation (Signed)
Anesthesia Evaluation  Patient identified by MRN, date of birth, ID band Patient awake    Reviewed: Allergy & Precautions, H&P , NPO status , Patient's Chart, lab work & pertinent test results  History of Anesthesia Complications Negative for: history of anesthetic complications  Airway Mallampati: III  TM Distance: <3 FB Neck ROM: limited    Dental  (+) Chipped, Poor Dentition, Missing   Pulmonary shortness of breath and with exertion, sleep apnea , COPD, Current Smoker,    Pulmonary exam normal        Cardiovascular Exercise Tolerance: Good hypertension, (-) angina+ CAD and + Peripheral Vascular Disease  + dysrhythmias Atrial Fibrillation  Rhythm:irregular Rate:Normal     Neuro/Psych PSYCHIATRIC DISORDERS  Neuromuscular disease negative neurological ROS     GI/Hepatic negative GI ROS, (+) Hepatitis -  Endo/Other  negative endocrine ROS  Renal/GU Renal disease  negative genitourinary   Musculoskeletal  (+) Arthritis ,   Abdominal   Peds  Hematology negative hematology ROS (+)   Anesthesia Other Findings Past Medical History: 07/1998: Allergic rhinitis 09/2016: Aortic atherosclerosis ([place])     Comment:  by CT No date: Arthritis     Comment:  BACK AND RIGHT KNEE 09/2016: CAD (coronary artery disease)     Comment:  RCA, LAD by CT 07/1998: COPD (chronic obstructive pulmonary disease) ([place])     Comment:  centrilobular emphysema (09/2016) w ongoing tobacco use,               spirometry (02/2013) No date: Dysrhythmia     Comment:  PVC'S No date: ETOH abuse No date: Hepatitis     Comment:  pos test once not now,  Positive  05/1998: HTN (hypertension) 06/1996: Hyperlipidemia 03/2013: Lumbar spinal stenosis     Comment:  s/p laminectomy 08/2015: Positive hepatitis C antibody test     Comment:  neg viral load - likely cleared infection No date: Sciatic nerve injury     Comment:  RESOLVED AFTER SURGERY No date:  Shortness of breath dyspnea     Comment:  WITH EXERTION No date: Smoker  Past Surgical History: 2003: ANTERIOR CERVICAL DECOMP/DISCECTOMY FUSION     Comment:  C3-7 2007: BACK SURGERY 07/01/2015: CARDIAC CATHETERIZATION; N/A     Comment:  Procedure: Left Heart Cath and Coronary Angiography;                Surgeon: Wellington Hampshire, MD;  Location: [facility]               [facility];  Service: Cardiovascular;  Laterality: N/A; 09/2010: COLONOSCOPY     Comment:  HPx2, diverticula, hemorrhoids, rpt 10 yrs [place]) 2010: ETT     Comment:  normal, ABIs normal 11: KNEE ARTHROSCOPY; Right     Comment:  cartlage, ligaments 84: KNEE ARTHROSCOPY; Left 03/27/2013: LUMBAR LAMINECTOMY/DECOMPRESSION MICRODISCECTOMY; Left     Comment:  Procedure: LUMBAR LAMINECTOMY/DECOMPRESSION               MICRODISCECTOMY 2 LEVELS;  Surgeon: Ophelia Charter,               MD;  Location: [facility];  Service: Neurosurgery;                Laterality: Left;  LEFT Lumbar Three Four diskectomy with              Lumbar Three-Four, Four-Five Laminectomy. L3/4 ahd L4/5               for  LSS [name])  11: SHOULDER ARTHROSCOPY; Right 02/2013: spriometry     Comment:  COPD with some restriction thought to be obesity related              ([place]) 06/27/2014: TOTAL KNEE ARTHROPLASTY; Right     Comment:  Procedure: RIGHT TOTAL KNEE ARTHROPLASTY;  Surgeon:               Mcarthur Rossetti, MD;  Location: [facility];  Service:               Orthopedics;  Laterality: Right; 01/2013: US ECHOCARDIOGRAPHY     Comment:  WNL, mild diastolic dysfunction     Reproductive/Obstetrics negative OB ROS                             Anesthesia Physical Anesthesia Plan  ASA: III  Anesthesia Plan: General   Post-op Pain Management:    Induction: Intravenous  PONV Risk Score and Plan: Propofol infusion and TIVA  Airway Management Planned: Natural Airway and Nasal Cannula  Additional Equipment:    Intra-op Plan:   Post-operative Plan:   Informed Consent: I have reviewed the patients History and Physical, chart, labs and discussed the procedure including the risks, benefits and alternatives for the proposed anesthesia with the patient or authorized representative who has indicated his/her understanding and acceptance.     Dental Advisory Given  Plan Discussed with: Anesthesiologist, CRNA and Surgeon  Anesthesia Plan Comments: (Patient consented for risks of anesthesia including but not limited to:  - adverse reactions to medications - risk of intubation if required - damage to eyes, teeth, lips or other oral mucosa - nerve damage due to positioning  - sore throat or hoarseness - Damage to heart, brain, nerves, lungs, other parts of body or loss of life  Patient voiced understanding.)        Anesthesia Quick Evaluation

## 2020-05-06 NOTE — Interval H&P Note (Signed)
History and Physical Interval Note:  05/06/2020 8:08 AM  [name]  has presented today for surgery, with the diagnosis of atrial fibrillation.  The various methods of treatment have been discussed with the patient and family. After consideration of risks, benefits and other options for treatment, the patient has consented to  Procedure(s): CARDIOVERSION (N/A) as a surgical intervention.  The patient's history has been reviewed, patient examined, no change in status, stable for surgery.  I have reviewed the patient's chart and labs.  Questions were answered to the patient's satisfaction.     Aaron Edelman [place]

## 2020-05-07 ENCOUNTER — Encounter: Payer: Self-pay | Admitting: Cardiology

## 2020-05-11 ENCOUNTER — Encounter (HOSPITAL_COMMUNITY): Payer: Self-pay

## 2020-05-11 ENCOUNTER — Other Ambulatory Visit: Payer: Self-pay

## 2020-05-11 ENCOUNTER — Encounter (HOSPITAL_COMMUNITY)
Admission: RE | Admit: 2020-05-11 | Discharge: 2020-05-11 | Disposition: A | Discharge status: Home / Self Care | Payer: Medicare Other | Source: Ambulatory Visit | Attending: Neurosurgery | Admitting: Neurosurgery

## 2020-05-11 DIAGNOSIS — M4802 Spinal stenosis, cervical region: Secondary | ICD-10-CM | POA: Diagnosis not present

## 2020-05-11 DIAGNOSIS — Z01812 Encounter for preprocedural laboratory examination: Secondary | ICD-10-CM | POA: Diagnosis not present

## 2020-05-11 DIAGNOSIS — G959 Disease of spinal cord, unspecified: Secondary | ICD-10-CM | POA: Insufficient documentation

## 2020-05-11 DIAGNOSIS — F17210 Nicotine dependence, cigarettes, uncomplicated: Secondary | ICD-10-CM | POA: Diagnosis not present

## 2020-05-11 HISTORY — DX: Headache, unspecified: R51.9

## 2020-05-11 LAB — COMPREHENSIVE METABOLIC PANEL
ALT: 20 U/L (ref 0–44)
AST: 18 U/L (ref 15–41)
Albumin: 4.2 g/dL (ref 3.5–5.0)
Alkaline Phosphatase: 58 U/L (ref 38–126)
Anion gap: 8 (ref 5–15)
BUN: 11 mg/dL (ref 8–23)
CO2: 25 mmol/L (ref 22–32)
Calcium: 9.4 mg/dL (ref 8.9–10.3)
Chloride: 102 mmol/L (ref 98–111)
Creatinine, Ser: 0.86 mg/dL (ref 0.61–1.24)
GFR, Estimated: >60 mL/min (ref >60)
Glucose, Bld: 120 mg/dL — ABNORMAL HIGH (ref 70–99)
Potassium: 4.6 mmol/L (ref 3.5–5.1)
Sodium: 135 mmol/L (ref 135–145)
Total Bilirubin: 1.4 mg/dL — ABNORMAL HIGH (ref 0.3–1.2)
Total Protein: 6.5 g/dL (ref 6.5–8.1)

## 2020-05-11 LAB — CBC
HCT: 38.1 % — ABNORMAL LOW (ref 39.0–52.0)
Hemoglobin: 12.7 g/dL — ABNORMAL LOW (ref 13.0–17.0)
MCH: 33.1 pg (ref 26.0–34.0)
MCHC: 33.3 g/dL (ref 30.0–36.0)
MCV: 99.2 fL (ref 80.0–100.0)
Platelets: 204 10*3/uL (ref 150–400)
RBC: 3.84 MIL/uL — ABNORMAL LOW (ref 4.22–5.81)
RDW: 12.8 % (ref 11.5–15.5)
WBC: 5.8 10*3/uL (ref 4.0–10.5)
nRBC: 0 % (ref 0.0–0.2)

## 2020-05-11 LAB — TYPE AND SCREEN
ABO/RH(D): A POS
Antibody Screen: NEGATIVE

## 2020-05-11 LAB — SURGICAL PCR SCREEN
MRSA, PCR: NEGATIVE
Staphylococcus aureus: NEGATIVE

## 2020-05-11 NOTE — Progress Notes (Addendum)
PCP -  Dr. Biagio Borg  Cardiologist - Dr. Mylo Red- Phine[facility] Real  Chest x-ray - nja  EKG - 05/06/20  Stress Test - 01/2013  ECHO - 03/31/20  Cardiac Cath - 06/2015  Sleep Study - no CPAP - no  LABS-CMP, T/S, CBC CMP drawn - patient drinks 28 beers a week. ASA-NA  Eliquis - last dose will be this evening  ERAS-no  HA1C-na Fasting Blood Sugar - na Checks Blood Sugar __0___ times a day  Anesthesia-  Pt denies having chest pain, sob, or fever at this time. All instructions explained to the pt, with a verbal understanding of the material. Pt agrees to go over the instructions while at home for a better understanding. Pt also instructed to self quarantine after being tested for COVID-19. The opportunity to ask questions was provided.

## 2020-05-11 NOTE — Progress Notes (Signed)
[id number] - [name], [place] [facility] [place] [address] [place] [address] Phone: [phone] Fax: [phone]      Your procedure is scheduled on Thursday, October 28th.  Report to [facility] Main Entrance "A" at 11:00 A.M., and check in at the Admitting office.  Call this number if you have problems the morning of surgery:  [phone]  Call [phone] if you have any questions prior to your surgery date Monday-Friday 8am-4pm    Remember:  Do not eat or drink after midnight the night before your surgery     Take these medicines the morning of surgery with A SIP OF WATER   Albuterol inhaler - if needed (bring with you on day of surgery)  Atorvastatin (Lipitor)  Symbicort inhaler - if needed  Metoprolol   Follow your surgeon's instructions on when to stop Eliquis.  If no instructions were given by your surgeon then you will need to call the office to get those instructions.    As of today, STOP taking any Aspirin (unless otherwise instructed by your surgeon) Aleve, Naproxen, Ibuprofen, Motrin, Advil, Goody's, BC's, all herbal medications, fish oil, and all vitamins.                      Do not wear jewelry.            Do not wear lotions, powders, colognes, or deodorant.            Men may shave face and neck.            Do not bring valuables to the hospital.            [facility] is not responsible for any belongings or valuables.  Do NOT Smoke (Tobacco/Vaping) or drink Alcohol 24 hours prior to your procedure If you use a CPAP at night, you may bring all equipment for your overnight stay.   Contacts, glasses, dentures or bridgework may not be worn into surgery.      For patients admitted to the hospital, discharge time will be determined by your treatment team.   Patients discharged the day of surgery will not be allowed to drive home, and someone needs to stay with them for 24  hours.    Special instructions:   [place]- Preparing For Surgery  Before surgery, you can play an important role. Because skin is not sterile, your skin needs to be as free of germs as possible. You can reduce the number of germs on your skin by washing with CHG (chlorahexidine gluconate) Soap before surgery.  CHG is an antiseptic cleaner which kills germs and bonds with the skin to continue killing germs even after washing.    Oral Hygiene is also important to reduce your risk of infection.  Remember - BRUSH YOUR TEETH THE MORNING OF SURGERY WITH YOUR REGULAR TOOTHPASTE  Please do not use if you have an allergy to CHG or antibacterial soaps. If your skin becomes reddened/irritated stop using the CHG.  Do not shave (including legs and underarms) for at least 48 hours prior to first CHG shower. It is OK to shave your face.  Please follow these instructions carefully.   1. Shower the NIGHT BEFORE SURGERY and the MORNING OF SURGERY with CHG Soap.   2. If you chose to wash your hair, wash your hair first as usual with your normal shampoo.  3. After  you shampoo, rinse your hair and body thoroughly to remove the shampoo.  4. Use CHG as you would any other liquid soap. You can apply CHG directly to the skin and wash gently with a scrungie or a clean washcloth.   5. Apply the CHG Soap to your body ONLY FROM THE NECK DOWN.  Do not use on open wounds or open sores. Avoid contact with your eyes, ears, mouth and genitals (private parts). Wash Face and genitals (private parts)  with your normal soap.   6. Wash thoroughly, paying special attention to the area where your surgery will be performed.  7. Thoroughly rinse your body with warm water from the neck down.  8. DO NOT shower/wash with your normal soap after using and rinsing off the CHG Soap.  9. Pat yourself dry with a CLEAN TOWEL.  10. Wear CLEAN PAJAMAS to bed the night before surgery  11. Place CLEAN SHEETS on your bed the night of  your first shower and DO NOT SLEEP WITH PETS.   Day of Surgery: Wear Clean/Comfortable clothing the morning of surgery Do not apply any deodorants/lotions.   Remember to brush your teeth WITH YOUR REGULAR TOOTHPASTE.   Please read over the following fact sheets that you were given.

## 2020-05-12 ENCOUNTER — Other Ambulatory Visit
Admission: RE | Admit: 2020-05-12 | Discharge: 2020-05-12 | Disposition: A | Discharge status: Home / Self Care | Payer: Medicare Other | Source: Ambulatory Visit | Attending: Neurosurgery | Admitting: Neurosurgery

## 2020-05-12 DIAGNOSIS — Z20822 Contact with and (suspected) exposure to covid-19: Secondary | ICD-10-CM | POA: Diagnosis not present

## 2020-05-12 DIAGNOSIS — Z01812 Encounter for preprocedural laboratory examination: Secondary | ICD-10-CM | POA: Insufficient documentation

## 2020-05-12 LAB — SARS CORONAVIRUS 2 (TAT 6-24 HRS): SARS Coronavirus 2: NEGATIVE

## 2020-05-12 NOTE — Progress Notes (Signed)
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  History includes smoking, COPD, HTN, HLD, afib (diagnosed 03/2020; unsuccessful DCCV 05/06/20), PVCs, mild CAD (2016 LHC), exertional dyspnea, ETOH abuse, migraines, C3-7 ACDF (06/26/02), back surgery (left L3-4 laminectomy 02/14/06, redo 03/27/13), likely cleared Hep C (based on 09/05/15 labs), right TKA (06/27/14). BMI is consistent with mild obesity.   Patient was evaluated on 04/24/20 for follow-up afib and for preoperative evaluation. Patient had been on Eliquis and Lopressor for at least 4 weeks, so cardioversion attempted on 05/06/20 but was unsuccessful. In regards to preoperative evaluation, he wrote, "Cervical spinal stenosis, laminectomy being planned.  Low risk procedure.  Can undergo procedure from a cardiac perspective.  EF preserved at 50%.  Continue Lopressor, Eliquis as prescribed.  If Eliquis is held for procedure, restart as soon as possible after." Smoking cessation advised.  Last Eliquis scheduled for 05/11/20 PM dose.   He reported drinking 28 beers per week. Labs showed normal PLT count, AST, ALT. Total bilirubin 1.4. Cr 0.86, H/H 12.7/38.1.   Presurgical COVID-19 test is scheduled for 05/12/2020.  Anesthesia team to evaluate on the day of surgery.   VS: BP (!) 142/93   Pulse 76   Temp 36.6 C   Resp 17   Ht 5\' 7"  (1.702 m)   Wt 89 kg   SpO2 100%   BMI 30.71 kg/m    PROVIDERS: Ria Bush, MD is PCP  Kate Sable, MD is cardiologist   LABS: Labs reviewed: Acceptable for surgery. A1c 5.8% on 03/17/20.  (all labs ordered are  listed, but only abnormal results are displayed)  Labs Reviewed  CBC - Abnormal; Notable for the following components:      Result Value   RBC 3.84 (*)    Hemoglobin 12.7 (*)    HCT 38.1 (*)    All other components within normal limits  COMPREHENSIVE METABOLIC PANEL - Abnormal; Notable for the following components:   Glucose, Bld 120 (*)    Total Bilirubin 1.4 (*)    All other components within normal limits  SURGICAL PCR SCREEN  TYPE AND SCREEN    IMAGES: CXR 03/27/20: FINDINGS: The heart size and mediastinal contours are within normal limits. Aortic atherosclerosis noted. Pulmonary hyperinflation again seen, consistent with COPD. Both lungs are clear. Cervical spine fusion hardware again noted. IMPRESSION: COPD. No active cardiopulmonary disease.  MRI C-spine 02/21/20: IMPRESSION: - At C1-C2, prominent ligamentous hypertrophy/pannus formation posterior to the dens contributes to severe spinal canal stenosis with moderate to moderately severe spinal cord impingement. Vague T2 hyperintensity is questioned within the spinal cord at this level which may reflect myelomalacia or mild focal edema. - Sequela of prior C3-C7 ACDF. No significant canal stenosis at these levels. - Moderate/severe left neural foraminal narrowing at C6-C7. Additional sites of mild and moderate neural foraminal narrowing as described. - At T1-T2, there is multifactorial mild relative spinal canal narrowing and severe bilateral neural foraminal narrowing.  MRI L-spine 02/21/20: IMPRESSION: 1. Multilevel spondylosis, progressed since prior exam. 2. New inferiorly oriented right paracentral L2-3 protrusion with severe spinal canal and bilateral neural foraminal narrowing.  3. Moderate right L3-5 and severe left L3-4 neural foraminal narrowing. 4. Mild degenerative related periarticular inflammation involving the right L1-3 and left L2-3 facet joints.   EKG: 05/06/20: Atrial fibrillation at 80  bpm Ventricular premature complex Anteroseptal infarct, age indeterminate Baseline wander in lead(s) V4 V6   CV: Echo 03/31/20: IMPRESSIONS  1. Left ventricular ejection fraction, by estimation, is 45 to 50%. The  left ventricle has mildly decreased function. The left ventricle  demonstrates global hypokinesis. There is mild left ventricular  hypertrophy. Left ventricular diastolic parameters  are indeterminate.  2. Right ventricular systolic function is mildly reduced. The right  ventricular size is mildly enlarged. Tricuspid regurgitation signal is  inadequate for assessing PA pressure.  3. Right atrial size was mildly dilated.  4. The mitral valve is normal in structure. Trivial mitral valve  regurgitation.  5. The aortic valve is tricuspid. Aortic valve regurgitation is not  visualized. No aortic stenosis is present.  6. The inferior vena cava is normal in size with greater than 50%  respiratory variability, suggesting right atrial pressure of 3 mmHg.    Cardiac cath 07/01/15:  The left ventricular systolic function is normal.  Prox LAD to Mid LAD lesion, 30% stenosed.  Prox RCA to Mid RCA lesion, 20% stenosed. 1. Mildly calcified coronary arteries with mild two-vessel coronary artery disease involving the proximal to mid LAD and proximal to midright coronary artery. No evidence of obstructive disease. 2. Normal LV systolic function. 3. Moderately elevated left ventricular end-diastolic pressure at 24 mmHg.    Past Medical History:  Diagnosis Date  . Allergic rhinitis 07/1998  . Aortic atherosclerosis ([place]) 09/2016   by CT  . Arthritis    BACK AND RIGHT KNEE  . CAD (coronary artery disease) 09/2016   RCA, LAD by CT  . COPD (chronic obstructive pulmonary disease) ([place]) 07/1998   centrilobular emphysema (09/2016) w ongoing tobacco use, spirometry (02/2013)  . Dysrhythmia    PVC'S  . ETOH abuse   . Headache    migraine- x1  . Hepatitis    pos test once not  now,  Positive   . HTN (hypertension) 05/1998  . Hyperlipidemia 06/1996  . Lumbar spinal stenosis 03/2013   s/p laminectomy  . 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 Shortness of breath dyspnea    WITH EXERTION  . Smoker     Past Surgical History:  Procedure Laterality Date  . ANTERIOR CERVICAL DECOMP/DISCECTOMY FUSION  2003   C3-7  . BACK SURGERY  2007  . CARDIAC CATHETERIZATION N/A 07/01/2015   Procedure: Left Heart Cath and Coronary Angiography;  Surgeon: Wellington Hampshire, MD;  Location: [place] [facility];  Service: Cardiovascular;  Laterality: N/A;  . CARDIOVERSION N/A 05/06/2020   Procedure: CARDIOVERSION;  Surgeon: Kate Sable, MD;  Location: [facility] ORS;  Service: Cardiovascular;  Laterality: N/A;  . COLONOSCOPY  09/2010   HPx2, diverticula, hemorrhoids, rpt 10 yrs Benson [place])  . ETT  2010   normal, ABIs normal  . KNEE ARTHROSCOPY Right 11   cartlage, ligaments  . KNEE ARTHROSCOPY Left 84  . LUMBAR LAMINECTOMY/DECOMPRESSION MICRODISCECTOMY Left 03/27/2013   Procedure: LUMBAR LAMINECTOMY/DECOMPRESSION MICRODISCECTOMY 2 LEVELS;  Surgeon: Ophelia Charter, MD;  Location: [place] [facility];  Service: Neurosurgery;  Laterality: Left;  LEFT Lumbar Three Four diskectomy with Lumbar Three-Four, Four-Five Laminectomy. L3/4 ahd L4/5 for LSS [name])   .  SHOULDER ARTHROSCOPY Right 11  . spriometry  02/2013   COPD with some restriction thought to be obesity related ([place])  . TOTAL KNEE ARTHROPLASTY Right 06/27/2014   Procedure: RIGHT TOTAL KNEE ARTHROPLASTY;  Surgeon: Mcarthur Rossetti, MD;  Location: [facility];  Service: Orthopedics;  Laterality: Right;  . US ECHOCARDIOGRAPHY  01/2013   WNL, mild diastolic dysfunction    MEDICATIONS: . Albuterol Sulfate (PROAIR RESPICLICK) 072 (90 Base) MCG/ACT AEPB  . apixaban (ELIQUIS) 5 MG TABS tablet  . atorvastatin (LIPITOR) 10 MG tablet  .  budesonide-formoterol (SYMBICORT) 80-4.5 MCG/ACT inhaler  . lisinopril (ZESTRIL) 10 MG tablet  . metoprolol tartrate (LOPRESSOR) 50 MG tablet  . tamsulosin (FLOMAX) 0.4 MG CAPS capsule   No current facility-administered medications for this encounter.    Myra Gianotti, PA-C Surgical Short Stay/Anesthesiology [facility] Phone [phone] [facility] Phone [phone] 05/12/2020 10:50 AM

## 2020-05-12 NOTE — Anesthesia Preprocedure Evaluation (Addendum)
Anesthesia Evaluation  Patient identified by MRN, date of birth, ID band Patient awake    Reviewed: Allergy & Precautions, NPO status , Patient's Chart, lab work & pertinent test results  Airway Mallampati: II  TM Distance: >3 FB Neck ROM: Full    Dental  (+) Dental Advisory Given, Teeth Intact   Pulmonary shortness of breath, COPD, Current Smoker,    Pulmonary exam normal breath sounds clear to auscultation       Cardiovascular hypertension, Pt. on medications and Pt. on home beta blockers + CAD and + Peripheral Vascular Disease  + dysrhythmias  Rhythm:Irregular Rate:Normal     Neuro/Psych  Headaches, PSYCHIATRIC DISORDERS  Neuromuscular disease    GI/Hepatic negative GI ROS, (+) Hepatitis -  Endo/Other  negative endocrine ROS  Renal/GU Renal disease     Musculoskeletal  (+) Arthritis ,   Abdominal (+) + obese,   Peds  Hematology  (+) Blood dyscrasia, anemia ,   Anesthesia Other Findings   Reproductive/Obstetrics                                                            Anesthesia Evaluation  Patient identified by MRN, date of birth, ID band Patient awake    Reviewed: Allergy & Precautions, H&P , NPO status , Patient's Chart, lab work & pertinent test results  History of Anesthesia Complications Negative for: history of anesthetic complications  Airway Mallampati: III  TM Distance: <3 FB Neck ROM: limited    Dental  (+) Chipped, Poor Dentition, Missing   Pulmonary shortness of breath and with exertion, sleep apnea , COPD, Current Smoker,    Pulmonary exam normal        Cardiovascular Exercise Tolerance: Good hypertension, (-) angina+ CAD and + Peripheral Vascular Disease  + dysrhythmias Atrial Fibrillation  Rhythm:irregular Rate:Normal     Neuro/Psych PSYCHIATRIC DISORDERS  Neuromuscular disease negative neurological ROS     GI/Hepatic negative GI ROS, (+)  Hepatitis -  Endo/Other  negative endocrine ROS  Renal/GU Renal disease  negative genitourinary   Musculoskeletal  (+) Arthritis ,   Abdominal   Peds  Hematology negative hematology ROS (+)   Anesthesia Other Findings Past Medical History: 07/1998: Allergic rhinitis 09/2016: Aortic atherosclerosis ([place])     Comment:  by CT No date: Arthritis     Comment:  BACK AND RIGHT KNEE 09/2016: CAD (coronary artery disease)     Comment:  RCA, LAD by CT 07/1998: COPD (chronic obstructive pulmonary disease) ([place])     Comment:  centrilobular emphysema (09/2016) w ongoing tobacco use,               spirometry (02/2013) No date: Dysrhythmia     Comment:  PVC'S No date: ETOH abuse No date: Hepatitis     Comment:  pos test once not now,  Positive  05/1998: HTN (hypertension) 06/1996: Hyperlipidemia 03/2013: Lumbar spinal stenosis     Comment:  s/p laminectomy 08/2015: Positive hepatitis C antibody test     Comment:  neg viral load - likely cleared infection No date: Sciatic nerve injury     Comment:  RESOLVED AFTER SURGERY No date: Shortness of breath dyspnea     Comment:  WITH EXERTION No date: Smoker  Past Surgical History: 2003: ANTERIOR CERVICAL DECOMP/DISCECTOMY FUSION  Comment:  C3-7 2007: BACK SURGERY 07/01/2015: CARDIAC CATHETERIZATION; N/A     Comment:  Procedure: Left Heart Cath and Coronary Angiography;                Surgeon: Wellington Hampshire, MD;  Location: [place] [facility]               LAB;  Service: Cardiovascular;  Laterality: N/A; 09/2010: COLONOSCOPY     Comment:  HPx2, diverticula, hemorrhoids, rpt 10 yrs Benson [place]) 2010: ETT     Comment:  normal, ABIs normal 11: KNEE ARTHROSCOPY; Right     Comment:  cartlage, ligaments 84: KNEE ARTHROSCOPY; Left 03/27/2013: LUMBAR LAMINECTOMY/DECOMPRESSION MICRODISCECTOMY; Left     Comment:  Procedure: LUMBAR LAMINECTOMY/DECOMPRESSION               MICRODISCECTOMY 2 LEVELS;  Surgeon: Ophelia Charter,               MD;   Location: [place] [facility] ORS;  Service: Neurosurgery;                Laterality: Left;  LEFT Lumbar Three Four diskectomy with              Lumbar Three-Four, Four-Five Laminectomy. L3/4 ahd L4/5               for LSS [name])  11: SHOULDER ARTHROSCOPY; Right 02/2013: spriometry     Comment:  COPD with some restriction thought to be obesity related              ([place]) 06/27/2014: TOTAL KNEE ARTHROPLASTY; Right     Comment:  Procedure: RIGHT TOTAL KNEE ARTHROPLASTY;  Surgeon:               Mcarthur Rossetti, MD;  Location: [facility];  Service:               Orthopedics;  Laterality: Right; 01/2013: US ECHOCARDIOGRAPHY     Comment:  WNL, mild diastolic dysfunction     Reproductive/Obstetrics negative OB ROS                             Anesthesia Physical Anesthesia Plan  ASA: III  Anesthesia Plan: General   Post-op Pain Management:    Induction: Intravenous  PONV Risk Score and Plan: Propofol infusion and TIVA  Airway Management Planned: Natural Airway and Nasal Cannula  Additional Equipment:   Intra-op Plan:   Post-operative Plan:   Informed Consent: I have reviewed the patients History and Physical, chart, labs and discussed the procedure including the risks, benefits and alternatives for the proposed anesthesia with the patient or authorized representative who has indicated his/her understanding and acceptance.     Dental Advisory Given  Plan Discussed with: Anesthesiologist, CRNA and Surgeon  Anesthesia Plan Comments: (Patient consented for risks of anesthesia including but not limited to:  - adverse reactions to medications - risk of intubation if required - damage to eyes, teeth, lips or other oral mucosa - nerve damage due to positioning  - sore throat or hoarseness - Damage to heart, brain, nerves, lungs, other parts of body or loss of life  Patient voiced understanding.)        Anesthesia Quick Evaluation  Anesthesia  Physical Anesthesia Plan  ASA: III  Anesthesia Plan: General   Post-op Pain Management:    Induction: Intravenous  PONV Risk Score and Plan:   Airway Management Planned: Oral ETT  Additional  Equipment: None  Intra-op Plan:   Post-operative Plan: Extubation in OR  Informed Consent: I have reviewed the patients History and Physical, chart, labs and discussed the procedure including the risks, benefits and alternatives for the proposed anesthesia with the patient or authorized representative who has indicated his/her understanding and acceptance.     Dental advisory given  Plan Discussed with: CRNA  Anesthesia Plan Comments: (See PAT note written 05/12/2020 by Myra Gianotti, PA-C. Unsuccessful DCCV for afib on 05/06/20. Drinks 28 beers/week. Has cardiology preoperative input.  )      Anesthesia Quick Evaluation

## 2020-05-14 ENCOUNTER — Observation Stay (HOSPITAL_COMMUNITY)
Admission: RE | Admit: 2020-05-14 | Discharge: 2020-05-15 | Disposition: A | Discharge status: Home / Self Care | Payer: Medicare Other | Attending: Neurosurgery | Admitting: Neurosurgery

## 2020-05-14 ENCOUNTER — Inpatient Hospital Stay (HOSPITAL_COMMUNITY): Payer: Medicare Other | Admitting: Certified Registered"

## 2020-05-14 ENCOUNTER — Other Ambulatory Visit: Payer: Self-pay

## 2020-05-14 ENCOUNTER — Encounter (HOSPITAL_COMMUNITY): Payer: Self-pay | Admitting: Neurosurgery

## 2020-05-14 ENCOUNTER — Inpatient Hospital Stay (HOSPITAL_COMMUNITY): Payer: Medicare Other | Admitting: Vascular Surgery

## 2020-05-14 ENCOUNTER — Encounter (HOSPITAL_COMMUNITY)
Admission: RE | Disposition: A | Discharge status: Home / Self Care | Payer: Self-pay | Source: Home / Self Care | Attending: Neurosurgery

## 2020-05-14 ENCOUNTER — Inpatient Hospital Stay (HOSPITAL_COMMUNITY): Payer: Medicare Other

## 2020-05-14 DIAGNOSIS — Z7901 Long term (current) use of anticoagulants: Secondary | ICD-10-CM | POA: Diagnosis not present

## 2020-05-14 DIAGNOSIS — Z96651 Presence of right artificial knee joint: Secondary | ICD-10-CM | POA: Diagnosis not present

## 2020-05-14 DIAGNOSIS — Z419 Encounter for procedure for purposes other than remedying health state, unspecified: Secondary | ICD-10-CM

## 2020-05-14 DIAGNOSIS — Z981 Arthrodesis status: Secondary | ICD-10-CM | POA: Diagnosis not present

## 2020-05-14 DIAGNOSIS — M5000 Cervical disc disorder with myelopathy, unspecified cervical region: Secondary | ICD-10-CM | POA: Diagnosis not present

## 2020-05-14 DIAGNOSIS — G992 Myelopathy in diseases classified elsewhere: Secondary | ICD-10-CM | POA: Diagnosis present

## 2020-05-14 DIAGNOSIS — Z79899 Other long term (current) drug therapy: Secondary | ICD-10-CM | POA: Insufficient documentation

## 2020-05-14 DIAGNOSIS — I2510 Atherosclerotic heart disease of native coronary artery without angina pectoris: Secondary | ICD-10-CM | POA: Insufficient documentation

## 2020-05-14 DIAGNOSIS — J449 Chronic obstructive pulmonary disease, unspecified: Secondary | ICD-10-CM | POA: Diagnosis not present

## 2020-05-14 DIAGNOSIS — M5001 Cervical disc disorder with myelopathy,  high cervical region: Secondary | ICD-10-CM | POA: Diagnosis not present

## 2020-05-14 DIAGNOSIS — M4802 Spinal stenosis, cervical region: Secondary | ICD-10-CM | POA: Diagnosis not present

## 2020-05-14 DIAGNOSIS — M542 Cervicalgia: Principal | ICD-10-CM | POA: Insufficient documentation

## 2020-05-14 DIAGNOSIS — F17210 Nicotine dependence, cigarettes, uncomplicated: Secondary | ICD-10-CM | POA: Diagnosis not present

## 2020-05-14 DIAGNOSIS — I119 Hypertensive heart disease without heart failure: Secondary | ICD-10-CM | POA: Diagnosis not present

## 2020-05-14 DIAGNOSIS — E785 Hyperlipidemia, unspecified: Secondary | ICD-10-CM | POA: Diagnosis not present

## 2020-05-14 HISTORY — PX: POSTERIOR CERVICAL FUSION/FORAMINOTOMY: SHX5038

## 2020-05-14 SURGERY — POSTERIOR CERVICAL FUSION/FORAMINOTOMY LEVEL 2
Anesthesia: General | Site: Spine Cervical

## 2020-05-14 MED ORDER — MIDAZOLAM HCL 2 MG/2ML IJ SOLN
INTRAMUSCULAR | Status: AC
  Filled 2020-05-14: qty 2

## 2020-05-14 MED ORDER — 0.9 % SODIUM CHLORIDE (POUR BTL) OPTIME
TOPICAL | Status: DC | PRN
  Administered 2020-05-14: 1000 mL

## 2020-05-14 MED ORDER — DEXAMETHASONE SODIUM PHOSPHATE 10 MG/ML IJ SOLN
INTRAMUSCULAR | Status: AC
  Filled 2020-05-14: qty 1

## 2020-05-14 MED ORDER — PHENYLEPHRINE 40 MCG/ML (10ML) SYRINGE FOR IV PUSH (FOR BLOOD PRESSURE SUPPORT)
PREFILLED_SYRINGE | INTRAVENOUS | Status: DC | PRN
  Administered 2020-05-14 (×3): 80 ug via INTRAVENOUS
  Administered 2020-05-14: 160 ug via INTRAVENOUS
  Administered 2020-05-14: 80 ug via INTRAVENOUS
  Administered 2020-05-14: 160 ug via INTRAVENOUS
  Administered 2020-05-14 (×2): 120 ug via INTRAVENOUS

## 2020-05-14 MED ORDER — ONDANSETRON HCL 4 MG/2ML IJ SOLN
4.0000 mg | Freq: Four times a day (QID) | INTRAMUSCULAR | Status: DC | PRN
  Administered 2020-05-14: 4 mg via INTRAVENOUS
  Filled 2020-05-14: qty 2

## 2020-05-14 MED ORDER — ONDANSETRON HCL 4 MG/2ML IJ SOLN
INTRAMUSCULAR | Status: DC | PRN
  Administered 2020-05-14: 4 mg via INTRAVENOUS

## 2020-05-14 MED ORDER — LACTATED RINGERS IV SOLN: INTRAVENOUS | Status: DC

## 2020-05-14 MED ORDER — PHENYLEPHRINE 40 MCG/ML (10ML) SYRINGE FOR IV PUSH (FOR BLOOD PRESSURE SUPPORT)
PREFILLED_SYRINGE | INTRAVENOUS | Status: AC
  Filled 2020-05-14: qty 10

## 2020-05-14 MED ORDER — ACETAMINOPHEN 650 MG RE SUPP: 650.0000 mg | RECTAL | Status: DC | PRN

## 2020-05-14 MED ORDER — BUPIVACAINE-EPINEPHRINE (PF) 0.5% -1:200000 IJ SOLN
INTRAMUSCULAR | Status: DC | PRN
  Administered 2020-05-14: 10 mL

## 2020-05-14 MED ORDER — ROCURONIUM BROMIDE 10 MG/ML (PF) SYRINGE
PREFILLED_SYRINGE | INTRAVENOUS | Status: AC
  Filled 2020-05-14: qty 10

## 2020-05-14 MED ORDER — MORPHINE SULFATE (PF) 4 MG/ML IV SOLN: 4.0000 mg | INTRAVENOUS | Status: DC | PRN

## 2020-05-14 MED ORDER — BACITRACIN ZINC 500 UNIT/GM EX OINT
TOPICAL_OINTMENT | CUTANEOUS | Status: DC | PRN
  Administered 2020-05-14 (×2): 1 via TOPICAL

## 2020-05-14 MED ORDER — LISINOPRIL 10 MG PO TABS
5.0000 mg | ORAL_TABLET | Freq: Every day | ORAL | Status: DC
  Filled 2020-05-14: qty 1

## 2020-05-14 MED ORDER — THROMBIN 5000 UNITS EX SOLR
OROMUCOSAL | Status: DC | PRN
  Administered 2020-05-14: 5 mL via TOPICAL

## 2020-05-14 MED ORDER — PANTOPRAZOLE SODIUM 40 MG IV SOLR
40.0000 mg | Freq: Every day | INTRAVENOUS | Status: DC
  Administered 2020-05-14: 40 mg via INTRAVENOUS
  Filled 2020-05-14: qty 40

## 2020-05-14 MED ORDER — THROMBIN 20000 UNITS EX SOLR
CUTANEOUS | Status: AC
  Filled 2020-05-14: qty 20000

## 2020-05-14 MED ORDER — LIDOCAINE 2% (20 MG/ML) 5 ML SYRINGE
INTRAMUSCULAR | Status: AC
  Filled 2020-05-14: qty 5

## 2020-05-14 MED ORDER — CYCLOBENZAPRINE HCL 10 MG PO TABS
10.0000 mg | ORAL_TABLET | Freq: Three times a day (TID) | ORAL | Status: DC | PRN
  Administered 2020-05-14 – 2020-05-15 (×2): 10 mg via ORAL
  Filled 2020-05-14 (×2): qty 1

## 2020-05-14 MED ORDER — PROMETHAZINE HCL 25 MG/ML IJ SOLN: 6.2500 mg | INTRAMUSCULAR | Status: DC | PRN

## 2020-05-14 MED ORDER — ROCURONIUM BROMIDE 10 MG/ML (PF) SYRINGE
PREFILLED_SYRINGE | INTRAVENOUS | Status: DC | PRN
  Administered 2020-05-14 (×3): 20 mg via INTRAVENOUS
  Administered 2020-05-14: 50 mg via INTRAVENOUS
  Administered 2020-05-14: 30 mg via INTRAVENOUS

## 2020-05-14 MED ORDER — ONDANSETRON HCL 4 MG PO TABS: 4.0000 mg | ORAL_TABLET | Freq: Four times a day (QID) | ORAL | Status: DC | PRN

## 2020-05-14 MED ORDER — MENTHOL 3 MG MT LOZG: 1.0000 | LOZENGE | OROMUCOSAL | Status: DC | PRN

## 2020-05-14 MED ORDER — MEPERIDINE HCL 25 MG/ML IJ SOLN: 6.2500 mg | INTRAMUSCULAR | Status: DC | PRN

## 2020-05-14 MED ORDER — PHENYLEPHRINE 40 MCG/ML (10ML) SYRINGE FOR IV PUSH (FOR BLOOD PRESSURE SUPPORT)
PREFILLED_SYRINGE | INTRAVENOUS | Status: AC
  Filled 2020-05-14: qty 20

## 2020-05-14 MED ORDER — HEMOSTATIC AGENTS (NO CHARGE) OPTIME
TOPICAL | Status: DC | PRN
  Administered 2020-05-14: 1 via TOPICAL

## 2020-05-14 MED ORDER — ATORVASTATIN CALCIUM 10 MG PO TABS
10.0000 mg | ORAL_TABLET | Freq: Every day | ORAL | Status: DC
  Administered 2020-05-15: 10 mg via ORAL
  Filled 2020-05-14: qty 1

## 2020-05-14 MED ORDER — OXYCODONE HCL 5 MG PO TABS: 5.0000 mg | ORAL_TABLET | ORAL | Status: DC | PRN

## 2020-05-14 MED ORDER — BUPIVACAINE LIPOSOME 1.3 % IJ SUSP
20.0000 mL | Freq: Once | INTRAMUSCULAR | Status: AC
  Administered 2020-05-14: 20 mL
  Filled 2020-05-14: qty 20

## 2020-05-14 MED ORDER — PROPOFOL 10 MG/ML IV BOLUS
INTRAVENOUS | Status: DC | PRN
  Administered 2020-05-14: 150 mg via INTRAVENOUS
  Administered 2020-05-14: 30 mg via INTRAVENOUS
  Administered 2020-05-14: 50 mg via INTRAVENOUS

## 2020-05-14 MED ORDER — DEXMEDETOMIDINE (PRECEDEX) IN NS 20 MCG/5ML (4 MCG/ML) IV SYRINGE
PREFILLED_SYRINGE | INTRAVENOUS | Status: AC
  Filled 2020-05-14: qty 5

## 2020-05-14 MED ORDER — HYDROMORPHONE HCL 1 MG/ML IJ SOLN
0.2500 mg | INTRAMUSCULAR | Status: DC | PRN
  Administered 2020-05-14 (×3): 0.5 mg via INTRAVENOUS

## 2020-05-14 MED ORDER — THROMBIN 5000 UNITS EX SOLR
CUTANEOUS | Status: AC
  Filled 2020-05-14: qty 5000

## 2020-05-14 MED ORDER — ACETAMINOPHEN 325 MG PO TABS: 650.0000 mg | ORAL_TABLET | ORAL | Status: DC | PRN

## 2020-05-14 MED ORDER — DOCUSATE SODIUM 100 MG PO CAPS
100.0000 mg | ORAL_CAPSULE | Freq: Two times a day (BID) | ORAL | Status: DC
  Administered 2020-05-14 – 2020-05-15 (×2): 100 mg via ORAL
  Filled 2020-05-14 (×2): qty 1

## 2020-05-14 MED ORDER — CEFAZOLIN SODIUM-DEXTROSE 2-3 GM-%(50ML) IV SOLR
INTRAVENOUS | Status: DC | PRN
  Administered 2020-05-14: 2 g via INTRAVENOUS

## 2020-05-14 MED ORDER — THROMBIN 20000 UNITS EX SOLR
CUTANEOUS | Status: DC | PRN
  Administered 2020-05-14: 20 mL via TOPICAL

## 2020-05-14 MED ORDER — BUPIVACAINE-EPINEPHRINE 0.5% -1:200000 IJ SOLN
INTRAMUSCULAR | Status: AC
  Filled 2020-05-14: qty 1

## 2020-05-14 MED ORDER — CHLORHEXIDINE GLUCONATE CLOTH 2 % EX PADS: 6.0000 | MEDICATED_PAD | Freq: Once | CUTANEOUS | Status: DC

## 2020-05-14 MED ORDER — ONDANSETRON HCL 4 MG/2ML IJ SOLN
INTRAMUSCULAR | Status: AC
  Filled 2020-05-14: qty 2

## 2020-05-14 MED ORDER — PROPOFOL 10 MG/ML IV BOLUS
INTRAVENOUS | Status: AC
  Filled 2020-05-14: qty 20

## 2020-05-14 MED ORDER — FENTANYL CITRATE (PF) 250 MCG/5ML IJ SOLN
INTRAMUSCULAR | Status: AC
  Filled 2020-05-14: qty 5

## 2020-05-14 MED ORDER — CEFAZOLIN SODIUM-DEXTROSE 2-4 GM/100ML-% IV SOLN
2.0000 g | Freq: Three times a day (TID) | INTRAVENOUS | Status: AC
  Administered 2020-05-14 – 2020-05-15 (×2): 2 g via INTRAVENOUS
  Filled 2020-05-14 (×2): qty 100

## 2020-05-14 MED ORDER — MOMETASONE FURO-FORMOTEROL FUM 100-5 MCG/ACT IN AERO
2.0000 | INHALATION_SPRAY | Freq: Two times a day (BID) | RESPIRATORY_TRACT | Status: DC
  Filled 2020-05-14: qty 8.8

## 2020-05-14 MED ORDER — ORAL CARE MOUTH RINSE: 15.0000 mL | Freq: Once | OROMUCOSAL | Status: AC

## 2020-05-14 MED ORDER — EPHEDRINE SULFATE-NACL 50-0.9 MG/10ML-% IV SOSY
PREFILLED_SYRINGE | INTRAVENOUS | Status: DC | PRN
  Administered 2020-05-14: 15 mg via INTRAVENOUS

## 2020-05-14 MED ORDER — PHENYLEPHRINE HCL-NACL 10-0.9 MG/250ML-% IV SOLN
INTRAVENOUS | Status: DC | PRN
  Administered 2020-05-14: 25 ug/min via INTRAVENOUS

## 2020-05-14 MED ORDER — DEXAMETHASONE SODIUM PHOSPHATE 10 MG/ML IJ SOLN
INTRAMUSCULAR | Status: DC | PRN
  Administered 2020-05-14: 10 mg via INTRAVENOUS

## 2020-05-14 MED ORDER — LIDOCAINE 2% (20 MG/ML) 5 ML SYRINGE
INTRAMUSCULAR | Status: DC | PRN
  Administered 2020-05-14: 100 mg via INTRAVENOUS

## 2020-05-14 MED ORDER — FENTANYL CITRATE (PF) 100 MCG/2ML IJ SOLN
INTRAMUSCULAR | Status: DC | PRN
  Administered 2020-05-14: 50 ug via INTRAVENOUS
  Administered 2020-05-14 (×2): 100 ug via INTRAVENOUS

## 2020-05-14 MED ORDER — SUGAMMADEX SODIUM 200 MG/2ML IV SOLN
INTRAVENOUS | Status: DC | PRN
  Administered 2020-05-14: 200 mg via INTRAVENOUS

## 2020-05-14 MED ORDER — ALUM & MAG HYDROXIDE-SIMETH 200-200-20 MG/5ML PO SUSP: 30.0000 mL | Freq: Four times a day (QID) | ORAL | Status: DC | PRN

## 2020-05-14 MED ORDER — ACETAMINOPHEN 500 MG PO TABS
1000.0000 mg | ORAL_TABLET | Freq: Four times a day (QID) | ORAL | Status: DC
  Administered 2020-05-14 – 2020-05-15 (×3): 1000 mg via ORAL
  Filled 2020-05-14 (×3): qty 2

## 2020-05-14 MED ORDER — CHLORHEXIDINE GLUCONATE 0.12 % MT SOLN
15.0000 mL | Freq: Once | OROMUCOSAL | Status: AC
  Administered 2020-05-14: 15 mL via OROMUCOSAL
  Filled 2020-05-14: qty 15

## 2020-05-14 MED ORDER — TAMSULOSIN HCL 0.4 MG PO CAPS
0.4000 mg | ORAL_CAPSULE | Freq: Every day | ORAL | Status: DC
  Administered 2020-05-15: 0.4 mg via ORAL
  Filled 2020-05-14: qty 1

## 2020-05-14 MED ORDER — BISACODYL 10 MG RE SUPP: 10.0000 mg | Freq: Every day | RECTAL | Status: DC | PRN

## 2020-05-14 MED ORDER — OXYCODONE HCL 5 MG PO TABS
10.0000 mg | ORAL_TABLET | ORAL | Status: DC | PRN
  Administered 2020-05-14 – 2020-05-15 (×5): 10 mg via ORAL
  Filled 2020-05-14 (×5): qty 2

## 2020-05-14 MED ORDER — PHENOL 1.4 % MT LIQD: 1.0000 | OROMUCOSAL | Status: DC | PRN

## 2020-05-14 MED ORDER — METOPROLOL TARTRATE 25 MG PO TABS
50.0000 mg | ORAL_TABLET | Freq: Two times a day (BID) | ORAL | Status: DC
  Administered 2020-05-14 – 2020-05-15 (×2): 50 mg via ORAL
  Filled 2020-05-14 (×2): qty 2

## 2020-05-14 MED ORDER — ALBUTEROL SULFATE HFA 108 (90 BASE) MCG/ACT IN AERS
2.0000 | INHALATION_SPRAY | Freq: Four times a day (QID) | RESPIRATORY_TRACT | Status: DC | PRN
  Filled 2020-05-14: qty 6.7

## 2020-05-14 MED ORDER — BACITRACIN ZINC 500 UNIT/GM EX OINT
TOPICAL_OINTMENT | CUTANEOUS | Status: AC
  Filled 2020-05-14: qty 28.35

## 2020-05-14 MED ORDER — DEXMEDETOMIDINE (PRECEDEX) IN NS 20 MCG/5ML (4 MCG/ML) IV SYRINGE
PREFILLED_SYRINGE | INTRAVENOUS | Status: DC | PRN
  Administered 2020-05-14: 8 ug via INTRAVENOUS
  Administered 2020-05-14: 4 ug via INTRAVENOUS

## 2020-05-14 MED ORDER — MIDAZOLAM HCL 2 MG/2ML IJ SOLN
INTRAMUSCULAR | Status: DC | PRN
  Administered 2020-05-14: 2 mg via INTRAVENOUS

## 2020-05-14 SURGICAL SUPPLY — 76 items
BAND RUBBER #18 3X1/16 STRL (MISCELLANEOUS) IMPLANT
BASKET BONE COLLECTION (BASKET) ×2 IMPLANT
BENZOIN TINCTURE PRP APPL 2/3 (GAUZE/BANDAGES/DRESSINGS) ×2 IMPLANT
BIT DRILL NEURO 2X3.1 SFT TUCH (MISCELLANEOUS) ×1 IMPLANT
BLADE CLIPPER SURG (BLADE) ×2 IMPLANT
BLADE ULTRA TIP 2M (BLADE) ×2 IMPLANT
BUR MATCHSTICK NEURO 3.0 LAGG (BURR) ×2 IMPLANT
BUR PRECISION FLUTE 6.0 (BURR) ×2 IMPLANT
CANISTER SUCT 3000ML PPV (MISCELLANEOUS) ×2 IMPLANT
CAP CLSR POST CERV (Cap) ×16 IMPLANT
CARTRIDGE OIL MAESTRO DRILL (MISCELLANEOUS) ×1 IMPLANT
COVER WAND RF STERILE (DRAPES) ×2 IMPLANT
DIFFUSER DRILL AIR PNEUMATIC (MISCELLANEOUS) ×2 IMPLANT
DRAPE C-ARM 42X72 X-RAY (DRAPES) ×4 IMPLANT
DRAPE LAPAROTOMY 100X72 PEDS (DRAPES) ×2 IMPLANT
DRAPE MICROSCOPE LEICA (MISCELLANEOUS) IMPLANT
DRAPE SURG 17X23 STRL (DRAPES) ×6 IMPLANT
DRILL BIT 2.3 ZIMMER ×2 IMPLANT
DRILL NEURO 2X3.1 SOFT TOUCH (MISCELLANEOUS) ×2
DRSG OPSITE POSTOP 4X6 (GAUZE/BANDAGES/DRESSINGS) ×2 IMPLANT
ELECT BLADE 4.0 EZ CLEAN MEGAD (MISCELLANEOUS) ×2
ELECT REM PT RETURN 9FT ADLT (ELECTROSURGICAL) ×2
ELECTRODE BLDE 4.0 EZ CLN MEGD (MISCELLANEOUS) ×1 IMPLANT
ELECTRODE REM PT RTRN 9FT ADLT (ELECTROSURGICAL) ×1 IMPLANT
EVACUATOR 1/8 PVC DRAIN (DRAIN) ×2 IMPLANT
GAUZE 4X4 16PLY RFD (DISPOSABLE) IMPLANT
GAUZE SPONGE 4X4 12PLY STRL (GAUZE/BANDAGES/DRESSINGS) IMPLANT
GLOVE BIO SURGEON STRL SZ 6.5 (GLOVE) ×10 IMPLANT
GLOVE BIO SURGEON STRL SZ8 (GLOVE) ×2 IMPLANT
GLOVE BIO SURGEON STRL SZ8.5 (GLOVE) ×2 IMPLANT
GLOVE BIOGEL PI IND STRL 6.5 (GLOVE) ×3 IMPLANT
GLOVE BIOGEL PI IND STRL 7.5 (GLOVE) ×1 IMPLANT
GLOVE BIOGEL PI INDICATOR 6.5 (GLOVE) ×3
GLOVE BIOGEL PI INDICATOR 7.5 (GLOVE) ×1
GLOVE EXAM NITRILE XL STR (GLOVE) IMPLANT
GOWN STRL REUS W/ TWL LRG LVL3 (GOWN DISPOSABLE) ×3 IMPLANT
GOWN STRL REUS W/ TWL XL LVL3 (GOWN DISPOSABLE) ×1 IMPLANT
GOWN STRL REUS W/TWL 2XL LVL3 (GOWN DISPOSABLE) IMPLANT
GOWN STRL REUS W/TWL LRG LVL3 (GOWN DISPOSABLE) ×6
GOWN STRL REUS W/TWL XL LVL3 (GOWN DISPOSABLE) ×2
GRAFT BONE PROTEIOS SM 1CC (Orthopedic Implant) ×2 IMPLANT
HEMOSTAT POWDER KIT SURGIFOAM (HEMOSTASIS) ×2 IMPLANT
KIT BASIN OR (CUSTOM PROCEDURE TRAY) ×2 IMPLANT
KIT TURNOVER KIT B (KITS) ×2 IMPLANT
MARKER SKIN DUAL TIP RULER LAB (MISCELLANEOUS) ×2 IMPLANT
NEEDLE HYPO 21X1.5 SAFETY (NEEDLE) ×4 IMPLANT
NEEDLE HYPO 22GX1.5 SAFETY (NEEDLE) ×2 IMPLANT
NEEDLE SPNL 18GX3.5 QUINCKE PK (NEEDLE) ×2 IMPLANT
NS IRRIG 1000ML POUR BTL (IV SOLUTION) ×2 IMPLANT
OIL CARTRIDGE MAESTRO DRILL (MISCELLANEOUS) ×2
PACK LAMINECTOMY NEURO (CUSTOM PROCEDURE TRAY) ×2 IMPLANT
PAD ARMBOARD 7.5X6 YLW CONV (MISCELLANEOUS) ×6 IMPLANT
PATTIES SURGICAL .5 X.5 (GAUZE/BANDAGES/DRESSINGS) ×2 IMPLANT
PIN MAYFIELD SKULL DISP (PIN) ×2 IMPLANT
PUTTY DBM 5CC CALC GRAN ×2 IMPLANT
ROD SPINAL 3.5X400MM TI (Rod) ×2 IMPLANT
SCREW VIRAGE 3.5X14 (Screw) ×8 IMPLANT
SCREW VIRAGE 3.5X30 CANN (Screw) ×4 IMPLANT
SCREW VIRAGE 3.5X30 SM SHANK (Cage) ×4 IMPLANT
SPONGE LAP 4X18 RFD (DISPOSABLE) IMPLANT
SPONGE SURGIFOAM ABS GEL 100 (HEMOSTASIS) ×2 IMPLANT
SPONGE SURGIFOAM ABS GEL SZ50 (HEMOSTASIS) ×2 IMPLANT
STAPLER SKIN PROX WIDE 3.9 (STAPLE) ×2 IMPLANT
STRIP CLOSURE SKIN 1/2X4 (GAUZE/BANDAGES/DRESSINGS) ×2 IMPLANT
SUT ETHILON 2 0 FS 18 (SUTURE) IMPLANT
SUT VIC AB 0 CT1 18XCR BRD8 (SUTURE) ×1 IMPLANT
SUT VIC AB 0 CT1 8-18 (SUTURE) ×2
SUT VIC AB 2-0 CP2 18 (SUTURE) ×4 IMPLANT
SYR 20ML LL LF (SYRINGE) ×2 IMPLANT
SYR 30ML LL (SYRINGE) ×2 IMPLANT
SYR 3ML LL SCALE MARK (SYRINGE) ×4 IMPLANT
TOWEL GREEN STERILE (TOWEL DISPOSABLE) ×2 IMPLANT
TOWEL GREEN STERILE FF (TOWEL DISPOSABLE) ×2 IMPLANT
TRAY FOLEY MTR SLVR 16FR STAT (SET/KITS/TRAYS/PACK) IMPLANT
UNDERPAD 30X36 HEAVY ABSORB (UNDERPADS AND DIAPERS) IMPLANT
WATER STERILE IRR 1000ML POUR (IV SOLUTION) ×2 IMPLANT

## 2020-05-14 NOTE — Anesthesia Procedure Notes (Signed)
Procedure Name: Intubation Date/Time: 05/14/2020 1:11 PM Performed by: Barrington Ellison, CRNA Pre-anesthesia Checklist: Patient identified, Emergency Drugs available, Suction available and Patient being monitored Patient Re-evaluated:Patient Re-evaluated prior to induction Oxygen Delivery Method: Circle System Utilized Preoxygenation: Pre-oxygenation with 100% oxygen Induction Type: IV induction Ventilation: Mask ventilation without difficulty Laryngoscope Size: Mac and 4 Grade View: Grade I Tube type: Oral Tube size: 7.5 mm Number of attempts: 1 Airway Equipment and Method: Stylet and Oral airway Placement Confirmation: ETT inserted through vocal cords under direct vision,  positive ETCO2 and breath sounds checked- equal and bilateral Secured at: 22 cm Tube secured with: Tape Dental Injury: Teeth and Oropharynx as per pre-operative assessment  Comments: Head remained in neutral position for intubation, per surgeon request.

## 2020-05-14 NOTE — Progress Notes (Signed)
Subjective: The patient is alert and pleasant.  His neck is appropriately sore.  Objective: Vital signs in last 24 hours: Temp:  [97.7 F (36.5 C)] 97.7 F (36.5 C) (10/28 1045) Pulse Rate:  [84] 84 (10/28 1045) Resp:  [18] 18 (10/28 1045) BP: (171-173)/(86-97) 171/86 (10/28 1116) SpO2:  [99 %] 99 % (10/28 1045) Weight:  [89 kg] 89 kg (10/28 1045) Estimated body mass index is 30.73 kg/m as calculated from the following:   Height as of this encounter: 5\' 7"  (1.702 m).   Weight as of this encounter: 89 kg.   Intake/Output from previous day: No intake/output data recorded. Intake/Output this shift: Total I/O In: 1500 [I.V.:1500] Out: 100 [Blood:100]  Physical exam the patient is alert.  He is moving all 4 extremities well.  Lab Results: No results for input(s): WBC, HGB, HCT, PLT in the last 72 hours. BMET No results for input(s): NA, K, CL, CO2, GLUCOSE, BUN, CREATININE, CALCIUM in the last 72 hours.  Studies/Results: No results found.  Assessment/Plan: The patient is doing well.  LOS: 0 days     Ophelia Charter 05/14/2020, 5:03 PM

## 2020-05-14 NOTE — Op Note (Signed)
Brief history: The patient is a 69 year old white male on whom I performed a C3-4, C4-5, C5-6 and C6-7 anterior cervical discectomy, fusion and plating years ago for a cervical myelopathy. He has done well until recently when he developed neck pain, bilateral arm and hand numbness, tingling, weakness, gait ataxia, etc. He was worked up with a cervical MRI which demonstrated severe stenosis at C1-2. I discussed the various treatment options with him. He has weighed the risks, benefits and alternatives and decided proceed with a posterior cervical decompression, instrumentation and fusion.  Preop diagnosis: Cervical stenosis, cervical myelopathy, cervicalgia  Postop diagnosis: The same  Procedure: C1-2 laminectomy; C1-2, C2-3, and C3-4 posterior lateral arthrodesis with local morselized autograft bone, ProteiOS, and Zimmer bone graft extender; posterior cervical instrumentation C1-C4 with Zimmer titanium screws and rods  Surgeon: Dr. Earle Gell  Assistant: Arnetha Massy, NP  Anesthesia: General endotracheal  Estimated blood loss: 150 cc  Specimens: None  Drains: One medium Hemovac  Complications: None  Description of procedure: The patient was brought to the operating room by the anesthesia team. General endotracheal anesthesia was induced. I then applied the [place] three-point headrest to the patient's calvarium. He was then carefully turned to the prone position on the chest rolls keeping his neck in a neutral position.  I then shaved his suboccipital region and this region in his posterior cervical and posterior thoracic region was then prepared with Betadine scrub and Betadine solution. Sterile drapes were applied. I then injected the area to be incised with Marcaine with epinephrine solution. I did a scalpel to make a midline incision over the C1-2, C2-3 and C3-4 interspaces. I used electrocautery to perform a bilateral subperiosteal dissection exposing the spinous process lamina and  facets at C2-3 and C3-4 as well as the C1 ring. We inserted the [facility] retractor for exposure.  I then used a high-speed drill with the suction trap to drill away the cephalad aspect of the C2 lamina and the C1 ring. We saved this bone for local autograft bone during the fusion. I then used the 2 mm Kerrison punch to complete the C1 laminectomy and the removal of the cephalad C2 lamina decompressing the thecal sac. We remove the ligamentum flavum at C1-2 decompressing the thecal sac.  Having completed the decompression we now turned our attention to the instrumentation. I dissected laterally at C1-2 exposing the C1 lateral mass. Under fluoroscopic guidance we drilled a pilot hole in the bilateral C1 lateral masses. We probed inside this pilot hole with the ball probe and noted there were no cortical breaches. We then  inserted a 3.5 x 30 mm partially-threaded screws into the lateral masses at C1 bilaterally. We got good bony purchase.  I then fluoroscopic guidance placed the millimeter fully threaded screws into the C2 pars bilaterally in a similar fashion. We placed lateral mass screws at C3 and C4 in a similar fashion. We got good bone purchase at every level. I then connected the unilateral screws with a rod and we secured the rod in place with the caps which we tightened appropriately completing the instrumentation bilaterally from C1-C4.  We now turned our attention to the posterior lateral arthrodesis at C1-2, C2-3 and C3-4. I used a high-speed drill to decorticate the lateral lamina facets at C1, C2, C3 and C4. We then laid a combination of ProteiOS soaked Gelfoam over these decorticated lateral masses, local autograft bone dust we obtained during the drilling, and Zimmer bone graft extender over these decorticated posterior lateral  structures at C1-2, C2-3 and C3-4 completing the posterior lateral arthrodesis at these levels.  We then obtained hemostasis using bipolar cautery. We injected  Exparel into the soft tissues. We placed a Hemovac drain in the epidural space and tunneled it out through a separate stab wound. We then remove the retractors and reapproximated the patient's cervical fascia with interrupted 0 Vicryl suture. We were approximate the subcutaneous tissue with 2-0 Vicryl suture. We reapproximated the skin with Steri-Strips and benzoin. The wound was then coated with bacitracin ointment. A sterile dressing was applied. The drapes were removed. The patient was returned to the supine position. I then remove the [place] three-point headrest from his calvarium. By report all sponge, instrument, and needle counts were correct at the end of this case.

## 2020-05-14 NOTE — Transfer of Care (Signed)
Immediate Anesthesia Transfer of Care Note  Patient: [name]  Procedure(s) Performed: LAMINECTOMY, POSTERIOR INSTRUMENTATION AND FUSION, CERVICAL ONE- CERVICAL TWO, CERVICAL TWO- CERVICAL THREE (N/A Spine Cervical)  Patient Location: PACU  Anesthesia Type:General  Level of Consciousness: awake, alert  and oriented  Airway & Oxygen Therapy: Patient Spontanous Breathing  Post-op Assessment: Report given to RN and Patient moving all extremities X 4  Post vital signs: Reviewed and stable  Last Vitals:  Vitals Value Taken Time  BP 103/81 05/14/20 1650  Temp    Pulse 95 05/14/20 1652  Resp 15 05/14/20 1651  SpO2 91 % 05/14/20 1652  Vitals shown include unvalidated device data.  Last Pain:  Vitals:   05/14/20 1106  TempSrc:   PainSc: 0-No pain      Patients Stated Pain Goal: 3 (01/75/10 2585)  Complications: No complications documented.

## 2020-05-14 NOTE — Progress Notes (Signed)
Made Dr. Lissa Hoard aware of bp 171/86. No new orders received.

## 2020-05-14 NOTE — H&P (Signed)
Subjective: The patient is a 69 year old white male on whom I previously performed anterior cervical discectomy, fusion and plating for cervical myelopathy.  He has done well for years but has developed recurrent neck pain, back pain, hand numbness tingling and weakness.  Has some urinary retention.  He was worked up with a cervical MRI which demonstrated severe stenosis at C1-2.  I discussed the various treatment options and recommended surgery.  He has decided proceed with surgery after weighing the risk, benefits and alternatives.  Past Medical History:  Diagnosis Date  . Allergic rhinitis 07/1998  . Aortic atherosclerosis ([place]) 09/2016   by CT  . Arthritis    BACK AND RIGHT KNEE  . CAD (coronary artery disease) 09/2016   RCA, LAD by CT  . COPD (chronic obstructive pulmonary disease) ([place]) 07/1998   centrilobular emphysema (09/2016) w ongoing tobacco use, spirometry (02/2013)  . Dysrhythmia    PVC'S  . ETOH abuse   . Headache    migraine- x1  . Hepatitis    pos test once not now,  Positive   . HTN (hypertension) 05/1998  . Hyperlipidemia 06/1996  . Lumbar spinal stenosis 03/2013   s/p laminectomy  . 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 Shortness of breath dyspnea    WITH EXERTION  . Smoker     Past Surgical History:  Procedure Laterality Date  . ANTERIOR CERVICAL DECOMP/DISCECTOMY FUSION  2003   C3-7  . BACK SURGERY  2007  . CARDIAC CATHETERIZATION N/A 07/01/2015   Procedure: Left Heart Cath and Coronary Angiography;  Surgeon: Wellington Hampshire, MD;  Location: [facility];  Service: Cardiovascular;  Laterality: N/A;  . CARDIOVERSION N/A 05/06/2020   Procedure: CARDIOVERSION;  Surgeon: Kate Sable, MD;  Location: [facility];  Service: Cardiovascular;  Laterality: N/A;  . COLONOSCOPY  09/2010   HPx2, diverticula, hemorrhoids, rpt 10 yrs [place])  . ETT  2010    normal, ABIs normal  . KNEE ARTHROSCOPY Right 11   cartlage, ligaments  . KNEE ARTHROSCOPY Left 84  . LUMBAR LAMINECTOMY/DECOMPRESSION MICRODISCECTOMY Left 03/27/2013   Procedure: LUMBAR LAMINECTOMY/DECOMPRESSION MICRODISCECTOMY 2 LEVELS;  Surgeon: Ophelia Charter, MD;  Location: [facility];  Service: Neurosurgery;  Laterality: Left;  LEFT Lumbar Three Four diskectomy with Lumbar Three-Four, Four-Five Laminectomy. L3/4 ahd L4/5 for LSS [name])   . SHOULDER ARTHROSCOPY Right 11  . spriometry  02/2013   COPD with some restriction thought to be obesity related ([place])  . TOTAL KNEE ARTHROPLASTY Right 06/27/2014   Procedure: RIGHT TOTAL KNEE ARTHROPLASTY;  Surgeon: Mcarthur Rossetti, MD;  Location: [facility];  Service: Orthopedics;  Laterality: Right;  . US ECHOCARDIOGRAPHY  01/2013   WNL, mild diastolic dysfunction    Allergies  Allergen Reactions  . Chantix [Varenicline] Other (See Comments)    headaches    Social History   Tobacco Use  . Smoking status: Current Every Day Smoker    Packs/day: 0.50    Years: 40.00    Pack years: 20.00    Types: Cigarettes  . Smokeless tobacco: Never Used  Substance Use Topics  . Alcohol use: Yes    Alcohol/week: 28.0 standard drinks    Types: 28 Cans of beer per week    Family History  Problem Relation Age of Onset  . Coronary artery disease Mother   . Hypertension Mother   .  Stroke Mother   . Coronary artery disease Father   . Hypertension Father   . Diabetes Father   . Stroke Father   . Pneumonia Sister   . Irregular heart beat Brother   . Maple syrup urine disease Brother   . Cancer Neg Hx    Prior to Admission medications   Medication Sig Start Date End Date Taking? Authorizing Provider  Albuterol Sulfate (PROAIR RESPICLICK) 161 (90 Base) MCG/ACT AEPB Inhale 2 puffs into the lungs every 6 (six) hours as needed (cough, shortness of breath). 08/29/17  Yes Ria Bush, MD  apixaban (ELIQUIS) 5 MG TABS tablet Take 1 tablet  (5 mg total) by mouth 2 (two) times daily. 03/30/20  Yes Agbor-Etang, Aaron Edelman, MD  atorvastatin (LIPITOR) 10 MG tablet TAKE 1 TABLET(10 MG) BY MOUTH DAILY Patient taking differently: Take 10 mg by mouth daily.  11/25/19  Yes Ria Bush, MD  budesonide-formoterol [facility]) 80-4.5 MCG/ACT inhaler Inhale 1 puff into the lungs 2 (two) times daily. 09/11/17  Yes Ria Bush, MD  lisinopril (ZESTRIL) 10 MG tablet Take 0.5 tablets (5 mg total) by mouth daily. 03/30/20  Yes Kate Sable, MD  metoprolol tartrate (LOPRESSOR) 50 MG tablet Take 1 tablet (50 mg total) by mouth 2 (two) times daily. 03/30/20  Yes Agbor-Etang, Aaron Edelman, MD  tamsulosin (FLOMAX) 0.4 MG CAPS capsule Take 1 capsule (0.4 mg total) by mouth daily. 04/21/20  Yes Ria Bush, MD     Review of Systems  Positive ROS: As above  All other systems have been reviewed and were otherwise negative with the exception of those mentioned in the HPI and as above.  Objective: Vital signs in last 24 hours: Temp:  [97.7 F (36.5 C)] 97.7 F (36.5 C) (10/28 1045) Pulse Rate:  [84] 84 (10/28 1045) Resp:  [18] 18 (10/28 1045) BP: (171-173)/(86-97) 171/86 (10/28 1116) SpO2:  [99 %] 99 % (10/28 1045) Weight:  [89 kg] 89 kg (10/28 1045) Estimated body mass index is 30.73 kg/m as calculated from the following:   Height as of this encounter: 5\' 7"  (1.702 m).   Weight as of this encounter: 89 kg.   General Appearance: Alert Head: Normocephalic, without obvious abnormality, atraumatic Eyes: PERRL, conjunctiva/corneas clear, EOM's intact,    Ears: Normal  Throat: Normal  Neck: The patient's anterior cervical incision is well-healed.  He has a very limited cervical range of motion. Back: unremarkable Lungs: Clear to auscultation bilaterally, respirations unlabored Heart: Regular rate and rhythm, no murmur, rub or gallop Abdomen: Soft, non-tender Extremities: Extremities normal, atraumatic, no cyanosis or edema Skin:  unremarkable  NEUROLOGIC:   Mental status: alert and oriented,Motor Exam -weakness in his bilateral hands. Sensory Exam -has numbness in his bilateral hands.  Reflexes:  Coordination - grossly normal Gait -unsteady Balance - grossly normal Cranial Nerves: I: smell Not tested  II: visual acuity  OS: Normal  OD: Normal   II: visual fields Full to confrontation  II: pupils Equal, round, reactive to light  III,VII: ptosis None  III,IV,VI: extraocular muscles  Full ROM  V: mastication Normal  V: facial light touch sensation  Normal  V,VII: corneal reflex  Present  VII: facial muscle function - upper  Normal  VII: facial muscle function - lower Normal  VIII: hearing Not tested  IX: soft palate elevation  Normal  IX,X: gag reflex Present  XI: trapezius strength  5/5  XI: sternocleidomastoid strength 5/5  XI: neck flexion strength  5/5  XII: tongue strength  Normal  Data Review Lab Results  Component Value Date   WBC 5.8 05/11/2020   HGB 12.7 (L) 05/11/2020   HCT 38.1 (L) 05/11/2020   MCV 99.2 05/11/2020   PLT 204 05/11/2020   Lab Results  Component Value Date   NA 135 05/11/2020   K 4.6 05/11/2020   CL 102 05/11/2020   CO2 25 05/11/2020   BUN 11 05/11/2020   CREATININE 0.86 05/11/2020   GLUCOSE 120 (H) 05/11/2020   Lab Results  Component Value Date   INR 0.94 06/29/2015    Assessment/Plan: C1-2 severe stenosis, cervical myelopathy, cervicalgia, cervical radiculopathy: I have discussed the situation with the patient.  I have reviewed his imaging studies with him and pointed out the abnormalities.  We have discussed the various treatment options including surgery.  I have described the surgical treatment option of a C1-2 posterior laminectomy instrumentation and fusion.  I have shown him surgical models.  We have discussed the risk, benefits, alternatives, expected postop course, and likelihood of achieving our goals with surgery.  I have answered all his questions.   He has decided to proceed with surgery.   Ophelia Charter 05/14/2020 12:20 PM

## 2020-05-15 ENCOUNTER — Encounter (HOSPITAL_COMMUNITY): Payer: Self-pay | Admitting: Neurosurgery

## 2020-05-15 DIAGNOSIS — M542 Cervicalgia: Secondary | ICD-10-CM | POA: Diagnosis not present

## 2020-05-15 DIAGNOSIS — M5000 Cervical disc disorder with myelopathy, unspecified cervical region: Secondary | ICD-10-CM | POA: Diagnosis not present

## 2020-05-15 DIAGNOSIS — I119 Hypertensive heart disease without heart failure: Secondary | ICD-10-CM | POA: Diagnosis not present

## 2020-05-15 DIAGNOSIS — J449 Chronic obstructive pulmonary disease, unspecified: Secondary | ICD-10-CM | POA: Diagnosis not present

## 2020-05-15 DIAGNOSIS — M4802 Spinal stenosis, cervical region: Secondary | ICD-10-CM | POA: Diagnosis not present

## 2020-05-15 DIAGNOSIS — I2510 Atherosclerotic heart disease of native coronary artery without angina pectoris: Secondary | ICD-10-CM | POA: Diagnosis not present

## 2020-05-15 MED ORDER — OXYCODONE-ACETAMINOPHEN 5-325 MG PO TABS: 1.0000 | ORAL_TABLET | ORAL | Status: DC | PRN

## 2020-05-15 MED ORDER — OXYCODONE-ACETAMINOPHEN 5-325 MG PO TABS
1.0000 | ORAL_TABLET | ORAL | 0 refills | Status: DC | PRN
Start: 2020-05-15 — End: 2020-06-30

## 2020-05-15 MED ORDER — CYCLOBENZAPRINE HCL 10 MG PO TABS: 10.0000 mg | ORAL_TABLET | Freq: Three times a day (TID) | ORAL | 0 refills | Status: DC | PRN

## 2020-05-15 MED ORDER — DOCUSATE SODIUM 100 MG PO CAPS
100.0000 mg | ORAL_CAPSULE | Freq: Two times a day (BID) | ORAL | 0 refills | Status: DC
Start: 2020-05-15 — End: 2020-06-30

## 2020-05-15 NOTE — Anesthesia Postprocedure Evaluation (Signed)
Anesthesia Post Note  Patient: [name]  Procedure(s) Performed: LAMINECTOMY, POSTERIOR INSTRUMENTATION AND FUSION, CERVICAL ONE- CERVICAL TWO, CERVICAL TWO- CERVICAL THREE (N/A Spine Cervical)     Patient location during evaluation: PACU Anesthesia Type: General Level of consciousness: sedated and patient cooperative Pain management: pain level controlled Vital Signs Assessment: post-procedure vital signs reviewed and stable Respiratory status: spontaneous breathing Cardiovascular status: stable Anesthetic complications: no   No complications documented.  Last Vitals:  Vitals:   05/14/20 2310 05/15/20 0513  BP: (!) 144/96 104/65  Pulse: 99 81  Resp: 18 20  Temp: 36.6 C 36.7 C  SpO2: 99% 97%    Last Pain:  Vitals:   05/15/20 0622  TempSrc:   PainSc: [place]

## 2020-05-15 NOTE — Care Management CC44 (Signed)
Condition Code 44 Documentation Completed  Patient Details  Name: [name] MRN: [id number] Date of Birth: [birth year]   Condition Code 44 given:  Yes Patient signature on Condition Code 44 notice:  Yes Documentation of 2 MD's agreement:  Yes Code 44 added to claim:  Yes    Bethann Berkshire, [place] 05/15/2020, 8:54 AM

## 2020-05-15 NOTE — Discharge Instructions (Signed)
Wound Care Leave incision open to air. You may shower. Do not scrub directly on incision.  Do not put any creams, lotions, or ointments on incision. Activity Walk each and every day, increasing distance each day. No lifting greater than 5 lbs.  Avoid excessive neck motion. No driving for 2 weeks; may ride as a passenger locally. Wear neck brace at all times except when showering.   Diet Resume your normal diet.  Return to Work Will be discussed at you follow up appointment. Call Your Doctor If Any of These Occur Redness, drainage, or swelling at the wound.  Temperature greater than 101 degrees. Severe pain not relieved by pain medication. Increased difficulty swallowing. Incision starts to come apart. Follow Up Appt Call today for appointment in 2-4 weeks ([phone]) or for problems.  If you have any hardware placed in your spine, you will need an x-ray before your appointment.   

## 2020-05-15 NOTE — Progress Notes (Signed)
Patient is discharged from room 3C04 at this time. Alert and in stable condition. IV site d/c'd and instructions read to patient, spouse and daughter with understanding verbalized and all questions answered. Left unit via wheelchair with all belongings at side.

## 2020-05-15 NOTE — Discharge Summary (Signed)
Physician Discharge Summary  Patient ID: BRIAR WITHERSPOON MRN: [id number] DOB/AGE: January 03, 1951 69 y.o.  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  The surgery went well.  The patient's postoperative course was unremarkable.  On postoperative day #1 he requested discharge to home.  He was given written and oral discharge instructions.  All his questions were answered.  He was instructed to resume his Eliquis on 05/19/2020.  Consults: PT, OT Significant Diagnostic Studies: None Treatments: C1-2 laminectomy, posterior instrumentation fusion C1-C4 Discharge Exam: Blood pressure 104/65, pulse 81, temperature 98.1 F (36.7 C), temperature source Oral, resp. rate 20, height 5\' 7"  (1.702 m), weight 89 kg, SpO2 97 %. The patient is alert and pleasant.  He looks well.  His strength is normal.  Disposition: Home  Discharge Instructions    Call MD for:  difficulty breathing, headache or visual disturbances   Complete by: As directed    Call MD for:  extreme fatigue   Complete by: As directed    Call MD for:  hives   Complete by: As directed    Call MD for:  persistant dizziness or light-headedness   Complete by: As directed    Call MD for:  persistant nausea and vomiting   Complete by: As directed    Call MD for:  redness, tenderness, or signs of infection (pain, swelling, redness, odor or green/yellow discharge around incision site)   Complete by: As directed    Call MD for:  severe uncontrolled pain   Complete by: As directed    Call MD for:  temperature >100.4   Complete by: As directed    Diet - low sodium heart  healthy   Complete by: As directed    Discharge instructions   Complete by: As directed    Call [phone] for a followup appointment. Take a stool softener while you are using pain medications.   Driving Restrictions   Complete by: As directed    Do not drive for 2 weeks.   Increase activity slowly   Complete by: As directed    Lifting restrictions   Complete by: As directed    Do not lift more than 5 pounds. No excessive bending or twisting.   May shower / Bathe   Complete by: As directed    Remove the dressing for 3 days after surgery.  You may shower, but leave the incision alone.   Remove dressing in 48 hours   Complete by: As directed      Allergies as of 05/15/2020      Reactions   Chantix [varenicline] Other (See Comments)   headaches      Medication List    TAKE these medications   Albuterol Sulfate 108 (90 Base) MCG/ACT Aepb Commonly known as: ProAir RespiClick Inhale 2 puffs into the lungs every 6 (six) hours as needed (cough, shortness of breath).   apixaban 5 MG Tabs tablet Commonly known as: ELIQUIS Take 1 tablet (5 mg total) by mouth 2 (two) times daily.   atorvastatin 10 MG tablet Commonly known as: LIPITOR TAKE 1 TABLET(10 MG) BY MOUTH DAILY What changed: See the new instructions.   cyclobenzaprine 10 MG tablet Commonly known as:  FLEXERIL Take 1 tablet (10 mg total) by mouth 3 (three) times daily as needed for muscle spasms.   docusate sodium 100 MG capsule Commonly known as: COLACE Take 1 capsule (100 mg total) by mouth 2 (two) times daily.   lisinopril 10 MG tablet Commonly known as: ZESTRIL Take 0.5 tablets (5 mg total) by mouth daily.   metoprolol tartrate 50 MG tablet Commonly known as: LOPRESSOR Take 1 tablet (50 mg total) by mouth 2 (two) times daily.   oxyCODONE-acetaminophen 5-325 MG tablet Commonly known as: PERCOCET/ROXICET Take 1-2 tablets by mouth every 4 (four) hours as needed for moderate pain.   Symbicort 80-4.5 MCG/ACT  inhaler Generic drug: budesonide-formoterol Inhale 1 puff into the lungs 2 (two) times daily.   tamsulosin 0.4 MG Caps capsule Commonly known as: FLOMAX Take 1 capsule (0.4 mg total) by mouth daily.        Signed: Ophelia Charter 05/15/2020, 6:46 AM

## 2020-05-15 NOTE — Evaluation (Signed)
Occupational Therapy Evaluation Patient Details Name: [name] MRN: [id number] DOB: June 22, 1951 Today's Date: 05/15/2020    History of Present Illness 69 y.o. male underwent C1-2 laminectomy with posterior instrumentation fusion from C1-C4 on the patient on 05/14/2020.   Clinical Impression   Patient admitted for the above diagnosis and procedure.  Presenting with mild unsteadiness with mobility, but no LOB and expected surgical discomfort.  Overall, he is doing well functionally, and is at or near baseline for ADL, toilet, and functional mobility.  ACDF Handout issued, reviewed and all questions answered.  No further OT needed in acute, or at home.      Follow Up Recommendations  No OT follow up    Equipment Recommendations  None recommended by OT    Recommendations for Other Services       Precautions / Restrictions Precautions Precautions: Fall Required Braces or Orthoses: Cervical Brace Cervical Brace: Hard collar Restrictions Weight Bearing Restrictions: No      Mobility Bed Mobility Overal bed mobility: Independent                  Transfers Overall transfer level: Modified independent Equipment used: Rolling walker (2 wheeled)                  Balance Overall balance assessment: Mild deficits observed, not formally tested                                         ADL either performed or assessed with clinical judgement   ADL Overall ADL's : Modified independent                                       General ADL Comments: Able to complete all self care from sit/stand position.  Mobility with RW at this point.     Vision Baseline Vision/History: Wears glasses Wears Glasses: At all times Patient Visual Report: No change from baseline       Perception     Praxis      Pertinent Vitals/Pain Pain Assessment: Faces Faces Pain Scale: Hurts a little bit Pain Location: surgical site Pain Descriptors /  Indicators: Aching Pain Intervention(s): Monitored during session     Hand Dominance     Extremity/Trunk Assessment             Communication Communication Communication: No difficulties   Cognition Arousal/Alertness: Awake/alert Behavior During Therapy: WFL for tasks assessed/performed Overall Cognitive Status: Within Functional Limits for tasks assessed                                     General Comments   Describes improved sensation to B hands.      Exercises     Shoulder Instructions      Home Living Family/patient expects to be discharged to:: Private residence Living Arrangements: Spouse/significant other Available Help at Discharge: Family Type of Home: House Home Access: Stairs to enter Technical brewer of Steps: 2 Entrance Stairs-Rails: Right Home Layout: One level     Bathroom Shower/Tub: Teacher, early years/pre: Standard Bathroom Accessibility: Yes How Accessible: Accessible via walker Home Equipment: Coats - 2 wheels;Cane - single point;Shower seat;Grab bars - tub/shower;Hand held shower head  Prior Functioning/Environment Level of Independence: Independent                 OT Problem List: Impaired sensation      OT Treatment/Interventions:      OT Goals(Current goals can be found in the care plan section) Acute Rehab OT Goals Patient Stated Goal: Get back to my routine slowly. OT Goal Formulation: With patient Time For Goal Achievement: 05/29/20 Potential to Achieve Goals: Good  OT Frequency:     Barriers to D/C:  none noted          Co-evaluation              AM-PAC OT "6 Clicks" Daily Activity     Outcome Measure Help from another person eating meals?: None Help from another person taking care of personal grooming?: None Help from another person toileting, which includes using toliet, bedpan, or urinal?: None Help from another person bathing (including washing, rinsing,  drying)?: None Help from another person to put on and taking off regular upper body clothing?: None Help from another person to put on and taking off regular lower body clothing?: None 6 Click Score: 24   End of Session Equipment Utilized During Treatment: Rolling walker Nurse Communication: Mobility status  Activity Tolerance: Patient tolerated treatment well Patient left: in bed;with call bell/phone within reach  OT Visit Diagnosis: Unsteadiness on feet (R26.81)                Time: 1287-8676 OT Time Calculation (min): 27 min Charges:  OT General Charges $OT Visit: 1 Visit OT Evaluation $OT Eval Moderate Complexity: 1 Mod  05/15/2020  Rich, OTR/L  Acute Rehabilitation Services  Office:  [phone]   Metta Clines 05/15/2020, 8:36 AM

## 2020-05-20 ENCOUNTER — Encounter (HOSPITAL_COMMUNITY): Payer: Self-pay | Admitting: Neurosurgery

## 2020-05-22 ENCOUNTER — Ambulatory Visit (INDEPENDENT_AMBULATORY_CARE_PROVIDER_SITE_OTHER): Payer: Medicare Other | Admitting: Cardiology

## 2020-05-22 ENCOUNTER — Other Ambulatory Visit: Payer: Self-pay

## 2020-05-22 ENCOUNTER — Encounter: Payer: Self-pay | Admitting: Cardiology

## 2020-05-22 VITALS — BP 118/72 | HR 84 | Ht 67.0 in | Wt 192.0 lb

## 2020-05-22 DIAGNOSIS — I10 Essential (primary) hypertension: Secondary | ICD-10-CM

## 2020-05-22 DIAGNOSIS — I2510 Atherosclerotic heart disease of native coronary artery without angina pectoris: Secondary | ICD-10-CM

## 2020-05-22 DIAGNOSIS — I4891 Unspecified atrial fibrillation: Secondary | ICD-10-CM | POA: Diagnosis not present

## 2020-05-22 NOTE — Progress Notes (Signed)
Cardiology Office Note:    Date:  05/22/2020   ID:  [name], DOB 1951-02-19, MRN [id number]  PCP:  Pedro Bush, MD  [facility] Cardiologist:  Pedro Sable, MD  [place] Electrophysiologist:  None   Referring MD: Pedro Bush, MD   Chief Complaint  Patient presents with  . Follow-up    Follow up. Medications verbally reviewed with patient.    History of Present Illness:    [name] is a 69 y.o. male with a hx of persistent A. fib on Eliquis, nonobstructive CAD (LHC 2016 LAD 30%, RCA 20%), hypertension, hyperlipidemia, , COPD, current smoker x40+ years who presents presents for follow-up.  DC cardioversion was attempted on 05/06/2020 unsuccessfully.  He underwent laminectomy for cervical spinal stenosis.  Has a cervical collar in place right now.  He feels well, has no concerns at this time.  Denies symptoms of chest pain, palpitations, shortness of breath.  Tolerating Eliquis without any adverse effects.  His insurance did not approve Eliquis, had to fill out a form to get 82-month supply.  He is worried as to what might happen in the future in terms of cost for medication.  Prior notes  patient with history of neck pains, back pains with radiculopathy.  Diagnosed with cervical spinal stenosis.  Laminectomy is being planned.  Saw primary care provider for preop exam, EKG noted to be in atrial fibrillation rapid ventricular response.  He was started on Eliquis and Lopressor.  He states his Eliquis was not approved by his insurance.  He denies any history of palpitations or chest pain. Echo 03/2020 showed low normal systolic function, EF 84%.  Past Medical History:  Diagnosis Date  . Allergic rhinitis 07/1998  . Aortic atherosclerosis ([place]) 09/2016   by CT  . Arthritis    BACK AND RIGHT KNEE  . CAD (coronary artery disease) 09/2016   RCA, LAD by CT  . COPD (chronic obstructive pulmonary disease) ([place]) 07/1998   centrilobular emphysema (09/2016) w  ongoing tobacco use, spirometry (02/2013)  . Dysrhythmia    PVC'S  . ETOH abuse   . Headache    migraine- x1  . Hepatitis    pos test once not now,  Positive   . HTN (hypertension) 05/1998  . Hyperlipidemia 06/1996  . Lumbar spinal stenosis 03/2013   s/p laminectomy  . 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 Shortness of breath dyspnea    WITH EXERTION  . Smoker     Past Surgical History:  Procedure Laterality Date  . ANTERIOR CERVICAL DECOMP/DISCECTOMY FUSION  2003   C3-7  . BACK SURGERY  2007  . CARDIAC CATHETERIZATION N/A 07/01/2015   Procedure: Left Heart Cath and Coronary Angiography;  Surgeon: Pedro Hampshire, MD;  Location: [facility];  Service: Cardiovascular;  Laterality: N/A;  . CARDIOVERSION N/A 05/06/2020   Procedure: CARDIOVERSION;  Surgeon: Pedro Sable, MD;  Location: [facility];  Service: Cardiovascular;  Laterality: N/A;  . COLONOSCOPY  09/2010   HPx2, diverticula, hemorrhoids, rpt 10 yrs [name])  . ETT  2010   normal, ABIs normal  . KNEE ARTHROSCOPY Right 11   cartlage, ligaments  . KNEE ARTHROSCOPY Left 84  . LUMBAR LAMINECTOMY/DECOMPRESSION MICRODISCECTOMY Left 03/27/2013   Procedure: LUMBAR LAMINECTOMY/DECOMPRESSION MICRODISCECTOMY 2 LEVELS;  Surgeon: Pedro Charter, MD;  Location: [place] [facility];  Service: Neurosurgery;  Laterality: Left;  LEFT Lumbar Three Four diskectomy with Lumbar Three-Four, Four-Five Laminectomy. L3/4 ahd L4/5 for LSS [name])   . POSTERIOR CERVICAL FUSION/FORAMINOTOMY N/A 05/14/2020   Procedure: LAMINECTOMY, POSTERIOR INSTRUMENTATION AND FUSION, CERVICAL ONE- CERVICAL TWO, CERVICAL TWO- CERVICAL THREE;  Surgeon: Pedro Pies, MD;  Location: [facility];  Service: Neurosurgery;  Laterality: N/A;  . SHOULDER ARTHROSCOPY Right 11  . spriometry  02/2013   COPD with some restriction thought to be obesity related ([name])  .  TOTAL KNEE ARTHROPLASTY Right 06/27/2014   Procedure: RIGHT TOTAL KNEE ARTHROPLASTY;  Surgeon: Pedro Rossetti, MD;  Location: [facility];  Service: Orthopedics;  Laterality: Right;  . US ECHOCARDIOGRAPHY  01/2013   WNL, mild diastolic dysfunction    Current Medications: Current Meds  Medication Sig  . Albuterol Sulfate (PROAIR RESPICLICK) 518 (90 Base) MCG/ACT AEPB Inhale 2 puffs into the lungs every 6 (six) hours as needed (cough, shortness of breath).  [name] apixaban (ELIQUIS) 5 MG TABS tablet Take 1 tablet (5 mg total) by mouth 2 (two) times daily.  [name] atorvastatin (LIPITOR) 10 MG tablet TAKE 1 TABLET(10 MG) BY MOUTH DAILY (Patient taking differently: Take 10 mg by mouth daily. )  . budesonide-formoterol (SYMBICORT) 80-4.5 MCG/ACT inhaler Inhale 1 puff into the lungs 2 (two) times daily.  . cyclobenzaprine (FLEXERIL) 10 MG tablet Take 1 tablet (10 mg total) by mouth 3 (three) times daily as needed for muscle spasms.  [name] docusate sodium (COLACE) 100 MG capsule Take 1 capsule (100 mg total) by mouth 2 (two) times daily.  [name] lisinopril (ZESTRIL) 10 MG tablet Take 0.5 tablets (5 mg total) by mouth daily.  . metoprolol tartrate (LOPRESSOR) 50 MG tablet Take 1 tablet (50 mg total) by mouth 2 (two) times daily.  [name] oxyCODONE-acetaminophen (PERCOCET/ROXICET) 5-325 MG tablet Take 1-2 tablets by mouth every 4 (four) hours as needed for moderate pain.  . tamsulosin (FLOMAX) 0.4 MG CAPS capsule Take 1 capsule (0.4 mg total) by mouth daily.     Allergies:   Chantix [varenicline]   Social History   Socioeconomic History  . Marital status: Married    Spouse name: Not on file  . Number of children: Not on file  . Years of education: Not on file  . Highest education level: Not on file  Occupational History  . Not on file  Tobacco Use  . Smoking status: Current Every Day Smoker    Packs/day: 0.50    Years: 40.00    Pack years: 20.00    Types: Cigarettes  . Smokeless tobacco: Never Used  Vaping  Use  . Vaping Use: Never used  Substance and Sexual Activity  . Alcohol use: Yes    Alcohol/week: 28.0 standard drinks    Types: 28 Cans of beer per week  . Drug use: No  . Sexual activity: Not Currently  Other Topics Concern  . Not on file  Social History Narrative   Lives with wife. Has 1 son, 2 daughters.    Occupation: Psychologist, clinical.    Activity: no exercise   Diet: good water, fruits/vegetables daily      Screened positive for OSA at recent hospitalization (03/2013)   Social Determinants of Health   Financial Resource Strain: Low Risk   . Difficulty of Paying Living Expenses: Not hard at all  Food Insecurity: No Food Insecurity  . Worried About Charity fundraiser in the Last Year: Never true  . Ran Out of Food in the Last Year: Never true  Transportation Needs: No Transportation Needs  . Lack of Transportation (Medical): No  . Lack of Transportation (Non-Medical): No  Physical Activity: Inactive  . Days of Exercise per Week: 0 days  . Minutes of Exercise per Session: 0 min  Stress: No Stress Concern Present  . Feeling of Stress : Not at all  Social Connections:   . Frequency of Communication with Friends and Family: Not on file  . Frequency of Social Gatherings with Friends and Family: Not on file  . Attends Religious Services: Not on file  . Active Member of Clubs or Organizations: Not on file  . Attends Archivist Meetings: Not on file  . Marital Status: Not on file     Family History: The patient's family history includes Coronary artery disease in his father and mother; Diabetes in his father; Hypertension in his father and mother; Irregular heart beat in his brother; Maple syrup urine disease in his brother; Pneumonia in his sister; Stroke in his father and mother. There is no history of Cancer.  ROS:   Please see the history of present illness.     All other systems reviewed and are negative.  EKGs/Labs/Other Studies Reviewed:    The  following studies were reviewed today:   EKG:  EKG not ordered today.    Recent Labs: 03/27/2020: TSH 3.42 05/11/2020: ALT 20; BUN 11; Creatinine, Ser 0.86; Hemoglobin 12.7; Platelets 204; Potassium 4.6; Sodium 135  Recent Lipid Panel    Component Value Date/Time   CHOL 174 03/17/2020 0000   TRIG 35.0 09/30/2019 0804   HDL 121 (A) 03/17/2020 0000   CHOLHDL 2 09/30/2019 0804   VLDL 7.0 09/30/2019 0804   LDLCALC 47 03/17/2020 0000   LDLDIRECT 82.0 09/07/2016 0900    Physical Exam:    VS:  BP 118/72 (BP Location: Left Arm, Patient Position: Sitting, Cuff Size: Normal)   Pulse 84   Ht 5\' 7"  (1.702 m)   Wt 192 lb (87.1 kg)   SpO2 96%   BMI 30.07 kg/m     Wt Readings from Last 3 Encounters:  05/22/20 192 lb (87.1 kg)  05/14/20 196 lb 3.4 oz (89 kg)  05/11/20 196 lb 1.6 oz (89 kg)     GEN:  Well nourished, well developed in no acute distress HEENT: Normal NECK: No JVD; No carotid bruits LYMPHATICS: No lymphadenopathy CARDIAC: Irregular irregular, no murmurs, rubs, gallops RESPIRATORY: Decreased breath sounds, no wheezing ABDOMEN: Soft, non-tender, non-distended MUSCULOSKELETAL:  No edema; No deformity  SKIN: Warm and dry NEUROLOGIC:  Alert and oriented x 3 PSYCHIATRIC:  Normal affect   ASSESSMENT:    1. Atrial fibrillation, unspecified type ([place])   2. Coronary artery disease involving native coronary artery of native heart without angina pectoris   3. Essential hypertension    PLAN:    In order of problems listed above:  1. Patient with persistent atrial fibrillation.  CHA2DS2-VASc score 3(age, htn, vasc).  Echo with low normal EF of about 50%.  Continue Lopressor to 50 mg twice daily, Eliquis 5 mg twice daily.  Patient is asymptomatic, feel DC cardioversion on 05/06/2020.  We will not reattempt reversion at this time.  If insurance not able to approve Eliquis, information for Xarelto given to patient.  We will see if his insurance covers Xarelto.  If this is the  case, we will switch patient from Eliquis to Xarelto 20 mg daily. 2. History of nonobstructive CAD.  Asymptomatic. Continue Eliquis, statin. 3. History of hypertension, BP  controlled.  Lisinopril, Lopressor  Follow-up in 3 months  Total encounter time 40 minutes  Greater than 50% was spent in counseling and coordination of care with the patient   Medication Adjustments/Labs and Tests Ordered: Current medicines are reviewed at length with the patient today.  Concerns regarding medicines are outlined above.  No orders of the defined types were placed in this encounter.  No orders of the defined types were placed in this encounter.   Patient Instructions  Medication Instructions:  Your physician recommends that you continue on your current medications as directed. Please refer to the Current Medication list given to you today.  *If you need a refill on your cardiac medications before your next appointment, please call your pharmacy*   Lab Work: None Ordered If you have labs (blood work) drawn today and your tests are completely normal, you will receive your results only by: [name] MyChart Message (if you have MyChart) OR . A paper copy in the mail If you have any lab test that is abnormal or we need to change your treatment, we will call you to review the results.   Testing/Procedures: None Ordered   Follow-Up: At [facility], you and your health needs are our priority.  As part of our continuing mission to provide you with exceptional heart care, we have created designated Provider Care Teams.  These Care Teams include your primary Cardiologist (physician) and Advanced Practice Providers (APPs -  Physician Assistants and Nurse Practitioners) who all work together to provide you with the care you need, when you need it.  We recommend signing up for the patient portal called "MyChart".  Sign up information is provided on this After Visit Summary.  MyChart is used to connect with  patients for Virtual Visits (Telemedicine).  Patients are able to view lab/test results, encounter notes, upcoming appointments, etc.  Non-urgent messages can be sent to your provider as well.   To learn more about what you can do with MyChart, go to [email or web address].    Your next appointment:   3 month(s)  The format for your next appointment:   In Person  Provider:   Kate Sable, MD   Other Instructions      Signed, Pedro Sable, MD  05/22/2020 1:00 PM    [place]

## 2020-05-22 NOTE — Patient Instructions (Signed)
Medication Instructions:  Your physician recommends that you continue on your current medications as directed. Please refer to the Current Medication list given to you today.  *If you need a refill on your cardiac medications before your next appointment, please call your pharmacy*   Lab Work: None Ordered If you have labs (blood work) drawn today and your tests are completely normal, you will receive your results only by: . MyChart Message (if you have MyChart) OR . A paper copy in the mail If you have any lab test that is abnormal or we need to change your treatment, we will call you to review the results.   Testing/Procedures: None Ordered   Follow-Up: At [facility], you and your health needs are our priority.  As part of our continuing mission to provide you with exceptional heart care, we have created designated Provider Care Teams.  These Care Teams include your primary Cardiologist (physician) and Advanced Practice Providers (APPs -  Physician Assistants and Nurse Practitioners) who all work together to provide you with the care you need, when you need it.  We recommend signing up for the patient portal called "MyChart".  Sign up information is provided on this After Visit Summary.  MyChart is used to connect with patients for Virtual Visits (Telemedicine).  Patients are able to view lab/test results, encounter notes, upcoming appointments, etc.  Non-urgent messages can be sent to your provider as well.   To learn more about what you can do with MyChart, go to [email or web address].    Your next appointment:   3 month(s)  The format for your next appointment:   In Person  Provider:   Brian Agbor-Etang, MD   Other Instructions   

## 2020-06-15 ENCOUNTER — Telehealth: Payer: Self-pay

## 2020-06-15 NOTE — Telephone Encounter (Signed)
Patient came into the office and dropped off PAF forms for Eliquis.

## 2020-06-16 DIAGNOSIS — M48061 Spinal stenosis, lumbar region without neurogenic claudication: Secondary | ICD-10-CM | POA: Diagnosis not present

## 2020-06-16 DIAGNOSIS — M5416 Radiculopathy, lumbar region: Secondary | ICD-10-CM | POA: Diagnosis not present

## 2020-06-16 DIAGNOSIS — M5441 Lumbago with sciatica, right side: Secondary | ICD-10-CM | POA: Diagnosis not present

## 2020-06-16 DIAGNOSIS — M4802 Spinal stenosis, cervical region: Secondary | ICD-10-CM | POA: Diagnosis not present

## 2020-06-17 ENCOUNTER — Other Ambulatory Visit: Payer: Self-pay | Admitting: Neurosurgery

## 2020-06-17 ENCOUNTER — Telehealth: Payer: Self-pay | Admitting: Cardiology

## 2020-06-17 HISTORY — PX: LUMBAR LAMINECTOMY: SHX95

## 2020-06-17 NOTE — Telephone Encounter (Signed)
   [facility] Pre-operative Risk Assessment    HEARTCARE STAFF: - Please ensure there is not already an duplicate clearance open for this procedure. - Under Visit Info/Reason for Call, type in Other and utilize the format Clearance MM/DD/YY or Clearance TBD. Do not use dashes or single digits. - If request is for dental extraction, please clarify the # of teeth to be extracted.  Request for surgical clearance:  1. What type of surgery is being performed? Lumbar laminectomy   2. When is this surgery scheduled? 07/15/20  3. What type of clearance is required (medical clearance vs. Pharmacy clearance to hold med vs. Both)? both  4. Are there any medications that need to be held prior to surgery and how long? Not listed, please advise if needed  5. Practice name and name of physician performing surgery? [facility]   6. What is the office phone number? [phone] ext [phone]   7.   What is the office fax number? [phone]  8.   Anesthesia type (None, local, MAC, general) ? General   [name] 06/17/2020, 2:44 PM  _________________________________________________________________   (provider comments below)

## 2020-06-18 ENCOUNTER — Other Ambulatory Visit: Payer: Self-pay | Admitting: Neurosurgery

## 2020-06-18 NOTE — Telephone Encounter (Signed)
Patient with diagnosis of afib on Eliquis for anticoagulation.    Procedure: lumbar laminectomy Date of procedure: 07/15/20  CHA2DS2-VASc Score = 3  This indicates a 3.2% annual risk of stroke. The patient's score is based upon: CHF History: 0 HTN History: 1 Diabetes History: 0 Stroke History: 0 Vascular Disease History: 1 Age Score: 1 Gender Score: 0   CrCl >125mL/min Platelet count 204K  Pt underwent DCCV on 05/06/20, now outside 30 day window and can interrupt anticoag.  Per office protocol, patient can hold Eliquis for 3 days prior to procedure.

## 2020-06-18 NOTE — Telephone Encounter (Signed)
Left VM

## 2020-06-18 NOTE — Telephone Encounter (Signed)
   Primary Cardiologist: Kate Sable, MD  Chart reviewed as part of pre-operative protocol coverage. Patient was contacted 06/18/2020 in reference to pre-operative risk assessment for pending surgery as outlined below.  YUVAL RUBENS was last seen on 05/22/20 by Dr. Garen Lah.  Since that day, REVANTH NEIDIG has done well.    Patient can complete more than 4.0 METS without angina. He states that he is scheduled to see Dr. Garen Lah two weeks prior to his surgery and he would like to complete preop clearance at that visit. I will forward to primary cardiologist to complete clearance and to fax letter.    Ledora Bottcher, PA 06/18/2020, 3:38 PM

## 2020-06-18 NOTE — Telephone Encounter (Signed)
Patient returning call.

## 2020-06-19 NOTE — Telephone Encounter (Signed)
Application Faxed.

## 2020-06-24 ENCOUNTER — Ambulatory Visit: Payer: Self-pay | Admitting: Urology

## 2020-06-30 ENCOUNTER — Other Ambulatory Visit: Payer: Self-pay

## 2020-06-30 ENCOUNTER — Ambulatory Visit (INDEPENDENT_AMBULATORY_CARE_PROVIDER_SITE_OTHER): Payer: Medicare Other | Admitting: Cardiology

## 2020-06-30 ENCOUNTER — Encounter: Payer: Self-pay | Admitting: Cardiology

## 2020-06-30 ENCOUNTER — Encounter: Payer: Self-pay | Admitting: Family Medicine

## 2020-06-30 VITALS — BP 146/90 | HR 68 | Wt 192.0 lb

## 2020-06-30 DIAGNOSIS — I4819 Other persistent atrial fibrillation: Secondary | ICD-10-CM | POA: Diagnosis not present

## 2020-06-30 DIAGNOSIS — I10 Essential (primary) hypertension: Secondary | ICD-10-CM | POA: Diagnosis not present

## 2020-06-30 MED ORDER — LISINOPRIL 10 MG PO TABS
10.0000 mg | ORAL_TABLET | Freq: Every day | ORAL | 3 refills | Status: DC
Start: 2020-06-30 — End: 2020-08-24

## 2020-06-30 NOTE — Progress Notes (Signed)
Electrophysiology Office Note:    Date:  06/30/2020   ID:  [name], DOB [birth year], MRN [id number]  PCP:  Ria Bush, MD  [facility] Cardiologist:  Kate Sable, MD  [place] Electrophysiologist:  None   Referring MD: Kate Sable, MD   Chief Complaint: Atrial fibrillation  History of Present Illness:    [name] is a 69 y.o. male who presents for an evaluation of atrial fibrillation at the request of Dr. Garen Lah. Their medical history includes COPD, ongoing tobacco use, coronary artery disease, hypertension, hyperlipidemia.  Patient tells me that he presented for preop clearance and was noted to be in atrial fibrillation.  He was completely asymptomatic.  Cardioversion was attempted on May 06, 2020 but he quickly went back into atrial fibrillation.  He is maintained on Eliquis for stroke prevention and is tolerating this medication well.  Left ventricular function is low normal, around 50%.  Past Medical History:  Diagnosis Date  . Allergic rhinitis 07/1998  . Aortic atherosclerosis ([place]) 09/2016   by CT  . Arthritis    BACK AND RIGHT KNEE  . CAD (coronary artery disease) 09/2016   RCA, LAD by CT  . COPD (chronic obstructive pulmonary disease) ([place]) 07/1998   centrilobular emphysema (09/2016) w ongoing tobacco use, spirometry (02/2013)  . Dysrhythmia    PVC'S  . ETOH abuse   . Headache    migraine- x1  . Hepatitis    pos test once not now,  Positive   . HTN (hypertension) 05/1998  . Hyperlipidemia 06/1996  . Lumbar spinal stenosis 03/2013   s/p laminectomy  . 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 Shortness of breath dyspnea    WITH EXERTION  . Smoker     Past Surgical History:  Procedure Laterality Date  . ANTERIOR CERVICAL DECOMP/DISCECTOMY FUSION  2003   C3-7  . BACK SURGERY  2007  . CARDIAC CATHETERIZATION N/A  07/01/2015   Procedure: Left Heart Cath and Coronary Angiography;  Surgeon: Wellington Hampshire, MD;  Location: [place] [facility];  Service: Cardiovascular;  Laterality: N/A;  . CARDIOVERSION N/A 05/06/2020   Procedure: CARDIOVERSION;  Surgeon: Kate Sable, MD;  Location: [facility] OR[facility];  Service: Cardiovascular;  Laterality: N/A;  . COLONOSCOPY  09/2010   HPx2, diverticula, hemorrhoids, rpt 10 yrs [place])  . ETT  2010   normal, ABIs normal  . KNEE ARTHROSCOPY Right 11   cartlage, ligaments  . KNEE ARTHROSCOPY Left 84  . LUMBAR LAMINECTOMY/DECOMPRESSION MICRODISCECTOMY Left 03/27/2013   Procedure: LUMBAR LAMINECTOMY/DECOMPRESSION MICRODISCECTOMY 2 LEVELS;  Surgeon: Ophelia Charter, MD;  Location: [place] [facility];  Service: Neurosurgery;  Laterality: Left;  LEFT Lumbar Three Four diskectomy with Lumbar Three-Four, Four-Five Laminectomy. L3/4 ahd L4/5 for LSS [name])   . POSTERIOR CERVICAL FUSION/FORAMINOTOMY N/A 05/14/2020   Procedure: LAMINECTOMY, POSTERIOR INSTRUMENTATION AND FUSION, CERVICAL ONE- CERVICAL TWO, CERVICAL TWO- CERVICAL THREE;  Surgeon: Newman Pies, MD;  Location: [place];  Service: Neurosurgery;  Laterality: N/A;  . SHOULDER ARTHROSCOPY Right 11  . spriometry  02/2013   COPD with some restriction thought to be obesity related ([place])  . TOTAL KNEE ARTHROPLASTY Right 06/27/2014   Procedure: RIGHT TOTAL KNEE ARTHROPLASTY;  Surgeon: Mcarthur Rossetti, MD;  Location: [facility];  Service: Orthopedics;  Laterality: Right;  . US ECHOCARDIOGRAPHY  01/2013   WNL, mild diastolic dysfunction  Current Medications: Current Meds  Medication Sig  . Albuterol Sulfate (PROAIR RESPICLICK) 185 (90 Base) MCG/ACT AEPB Inhale 2 puffs into the lungs every 6 (six) hours as needed (cough, shortness of breath).  [name] apixaban (ELIQUIS) 5 MG TABS tablet Take 1 tablet (5 mg total) by mouth 2 (two) times daily.  [name] atorvastatin (LIPITOR) 10 MG tablet TAKE 1 TABLET(10 MG) BY MOUTH DAILY  .  budesonide-formoterol (SYMBICORT) 80-4.5 MCG/ACT inhaler Inhale 1 puff into the lungs 2 (two) times daily.  . metoprolol tartrate (LOPRESSOR) 50 MG tablet Take 1 tablet (50 mg total) by mouth 2 (two) times daily.  . tamsulosin (FLOMAX) 0.4 MG CAPS capsule Take 1 capsule (0.4 mg total) by mouth daily.  . [DISCONTINUED] lisinopril (ZESTRIL) 10 MG tablet Take 0.5 tablets (5 mg total) by mouth daily.     Allergies:   Chantix [varenicline]   Social History   Socioeconomic History  . Marital status: Married    Spouse name: Not on file  . Number of children: Not on file  . Years of education: Not on file  . Highest education level: Not on file  Occupational History  . Not on file  Tobacco Use  . Smoking status: Current Every Day Smoker    Packs/day: 0.50    Years: 40.00    Pack years: 20.00    Types: Cigarettes  . Smokeless tobacco: Never Used  Vaping Use  . Vaping Use: Never used  Substance and Sexual Activity  . Alcohol use: Yes    Alcohol/week: 28.0 standard drinks    Types: 28 Cans of beer per week  . Drug use: No  . Sexual activity: Not Currently  Other Topics Concern  . Not on file  Social History Narrative   Lives with wife. Has 1 son, 2 daughters.    Occupation: Psychologist, clinical.    Activity: no exercise   Diet: good water, fruits/vegetables daily      Screened positive for OSA at recent hospitalization (03/2013)   Social Determinants of Health   Financial Resource Strain: Low Risk   . Difficulty of Paying Living Expenses: Not hard at all  Food Insecurity: No Food Insecurity  . Worried About Charity fundraiser in the Last Year: Never true  . Ran Out of Food in the Last Year: Never true  Transportation Needs: No Transportation Needs  . Lack of Transportation (Medical): No  . Lack of Transportation (Non-Medical): No  Physical Activity: Inactive  . Days of Exercise per Week: 0 days  . Minutes of Exercise per Session: 0 min  Stress: No Stress Concern Present   . Feeling of Stress : Not at all  Social Connections: Not on file     Family History: The patient's family history includes Coronary artery disease in his father and mother; Diabetes in his father; Hypertension in his father and mother; Irregular heart beat in his brother; Maple syrup urine disease in his brother; Pneumonia in his sister; Stroke in his father and mother. There is no history of Cancer.  ROS:   Please see the history of present illness.    All other systems reviewed and are negative.  EKGs/Labs/Other Studies Reviewed:    The following studies were reviewed today: Prior notes, echo, ECG  March 31, 2020 echo personally reviewed Left ventricular function mildly decreased, 45 to 50% Right ventricular function mildly reduced No significant valvular abnormalities  EKG:  The ekg ordered today demonstrates atrial fibrillation with a ventricular rate of 88 bpm.  Recent Labs: 03/27/2020: TSH 3.42 05/11/2020: ALT 20; BUN 11; Creatinine, Ser 0.86; Hemoglobin 12.7; Platelets 204; Potassium 4.6; Sodium 135  Recent Lipid Panel    Component Value Date/Time   CHOL 174 03/17/2020 0000   TRIG 35.0 09/30/2019 0804   HDL 121 (A) 03/17/2020 0000   CHOLHDL 2 09/30/2019 0804   VLDL 7.0 09/30/2019 0804   LDLCALC 47 03/17/2020 0000   LDLDIRECT 82.0 09/07/2016 0900    Physical Exam:    VS:  BP (!) 146/90   Pulse 68   Wt 192 lb (87.1 kg)   SpO2 98%   BMI 30.07 kg/m     Wt Readings from Last 3 Encounters:  06/30/20 192 lb (87.1 kg)  05/22/20 192 lb (87.1 kg)  05/14/20 196 lb 3.4 oz (89 kg)     GEN:  Well nourished, well developed in no acute distress.  Obese HEENT: Normal NECK: No JVD; No carotid bruits LYMPHATICS: No lymphadenopathy CARDIAC: Irregularly irregular, no murmurs, rubs, gallops RESPIRATORY:  Clear to auscultation without rales, wheezing or rhonchi  ABDOMEN: Soft, non-tender, non-distended MUSCULOSKELETAL:  No edema; No deformity  SKIN: Warm and  dry NEUROLOGIC:  Alert and oriented x 3 PSYCHIATRIC:  Normal affect   ASSESSMENT:    1. Atrial fibrillation, unspecified type ([place])    PLAN:    In order of problems listed above:  1. Persistent atrial fibrillation Unclear exact duration of his atrial fibrillation could be longstanding persistent.  He is completely asymptomatic.  His left ventricular function is low normal in the 45 to 50% range.  He is tolerating Eliquis twice daily without any bleeding issues.  We discussed the treatment options for his atrial fibrillation including continued conservative management with stroke prophylaxis, attempt at rhythm control using antiarrhythmic medications or ablation.  I do not think he is a good ablation candidate given his COPD and ongoing tobacco abuse.  I think rhythm control using an antiarrhythmic is an option but given that he is asymptomatic we mutually decided to pursue a conservative strategy using Eliquis for stroke prophylaxis.  If the patient develops symptoms in the future or if his left ventricular function worsens and is thought to be related to his atrial fibrillation, could consider initiation of of an antiarrhythmic for rhythm control.  2.  Hypertension Not controlled during today's visit.  We will increase his lisinopril back to 10 mg daily.   Medication Adjustments/Labs and Tests Ordered: Current medicines are reviewed at length with the patient today.  Concerns regarding medicines are outlined above.  No orders of the defined types were placed in this encounter.  Meds ordered this encounter  Medications  . lisinopril (ZESTRIL) 10 MG tablet    Sig: Take 1 tablet (10 mg total) by mouth daily.    Dispense:  30 tablet    Refill:  3     Signed, Lars Mage, MD, [facility]  06/30/2020 2:07 PM    Electrophysiology [facility]

## 2020-06-30 NOTE — Patient Instructions (Addendum)
Medication Instructions:  Your physician has recommended you make the following change in your medication:   Increase your Lisinopril to 10mg  - 1 tablet daily by mouth.  *If you need a refill on your cardiac medications before your next appointment, please call your pharmacy*   Lab Work: None ordered.  If you have labs (blood work) drawn today and your tests are completely normal, you will receive your results only by: [name] MyChart Message (if you have MyChart) OR . A paper copy in the mail If you have any lab test that is abnormal or we need to change your treatment, we will call you to review the results.   Testing/Procedures: None ordered.    Follow-Up: At [facility], you and your health needs are our priority.  As part of our continuing mission to provide you with exceptional heart care, we have created designated Provider Care Teams.  These Care Teams include your primary Cardiologist (physician) and Advanced Practice Providers (APPs -  Physician Assistants and Nurse Practitioners) who all work together to provide you with the care you need, when you need it.  We recommend signing up for the patient portal called "MyChart".  Sign up information is provided on this After Visit Summary.  MyChart is used to connect with patients for Virtual Visits (Telemedicine).  Patients are able to view lab/test results, encounter notes, upcoming appointments, etc.  Non-urgent messages can be sent to your provider as well.   To learn more about what you can do with MyChart, go to [email or web address].    Your next appointment:   6 month(s)  The format for your next appointment:   In Person  Provider:   Lars Mage, MD

## 2020-07-01 ENCOUNTER — Other Ambulatory Visit: Payer: Self-pay | Admitting: Family Medicine

## 2020-07-01 DIAGNOSIS — I4819 Other persistent atrial fibrillation: Secondary | ICD-10-CM

## 2020-07-01 DIAGNOSIS — R7303 Prediabetes: Secondary | ICD-10-CM

## 2020-07-01 DIAGNOSIS — N400 Enlarged prostate without lower urinary tract symptoms: Secondary | ICD-10-CM

## 2020-07-01 DIAGNOSIS — E559 Vitamin D deficiency, unspecified: Secondary | ICD-10-CM

## 2020-07-01 DIAGNOSIS — E785 Hyperlipidemia, unspecified: Secondary | ICD-10-CM

## 2020-07-01 NOTE — Addendum Note (Signed)
Addended by: Georgiann Cocker on: 07/01/2020 02:05 PM   Modules accepted: Orders

## 2020-07-02 ENCOUNTER — Other Ambulatory Visit (INDEPENDENT_AMBULATORY_CARE_PROVIDER_SITE_OTHER): Payer: Medicare Other

## 2020-07-02 ENCOUNTER — Other Ambulatory Visit: Payer: Self-pay

## 2020-07-02 DIAGNOSIS — I4819 Other persistent atrial fibrillation: Secondary | ICD-10-CM | POA: Diagnosis not present

## 2020-07-02 DIAGNOSIS — E785 Hyperlipidemia, unspecified: Secondary | ICD-10-CM | POA: Diagnosis not present

## 2020-07-02 DIAGNOSIS — R7303 Prediabetes: Secondary | ICD-10-CM | POA: Diagnosis not present

## 2020-07-02 DIAGNOSIS — N400 Enlarged prostate without lower urinary tract symptoms: Secondary | ICD-10-CM | POA: Diagnosis not present

## 2020-07-02 DIAGNOSIS — E559 Vitamin D deficiency, unspecified: Secondary | ICD-10-CM | POA: Diagnosis not present

## 2020-07-02 LAB — COMPREHENSIVE METABOLIC PANEL
ALT: 17 U/L (ref 0–53)
AST: 14 U/L (ref 0–37)
Albumin: 4.4 g/dL (ref 3.5–5.2)
Alkaline Phosphatase: 78 U/L (ref 39–117)
BUN: 9 mg/dL (ref 6–23)
CO2: 27 mEq/L (ref 19–32)
Calcium: 9.7 mg/dL (ref 8.4–10.5)
Chloride: 100 mEq/L (ref 96–112)
Creatinine, Ser: 0.91 mg/dL (ref 0.40–1.50)
GFR: 86.3 mL/min (ref >60.00)
Glucose, Bld: 130 mg/dL — ABNORMAL HIGH (ref 70–99)
Potassium: 4.7 mEq/L (ref 3.5–5.1)
Sodium: 135 mEq/L (ref 135–145)
Total Bilirubin: 1 mg/dL (ref 0.2–1.2)
Total Protein: 6.7 g/dL (ref 6.0–8.3)

## 2020-07-02 LAB — CBC WITH DIFFERENTIAL/PLATELET
Basophils Absolute: 0 10*3/uL (ref 0.0–0.1)
Basophils Relative: 0.6 % (ref 0.0–3.0)
Eosinophils Absolute: 0.1 10*3/uL (ref 0.0–0.7)
Eosinophils Relative: 2.8 % (ref 0.0–5.0)
HCT: 38.7 % — ABNORMAL LOW (ref 39.0–52.0)
Hemoglobin: 13.1 g/dL (ref 13.0–17.0)
Lymphocytes Relative: 47.8 % — ABNORMAL HIGH (ref 12.0–46.0)
Lymphs Abs: 2.2 10*3/uL (ref 0.7–4.0)
MCHC: 34 g/dL (ref 30.0–36.0)
MCV: 99.9 fl (ref 78.0–100.0)
Monocytes Absolute: 0.4 10*3/uL (ref 0.1–1.0)
Monocytes Relative: 9.4 % (ref 3.0–12.0)
Neutro Abs: 1.9 10*3/uL (ref 1.4–7.7)
Neutrophils Relative %: 39.4 % — ABNORMAL LOW (ref 43.0–77.0)
Platelets: 232 10*3/uL (ref 150.0–400.0)
RBC: 3.87 Mil/uL — ABNORMAL LOW (ref 4.22–5.81)
RDW: 14.8 % (ref 11.5–15.5)
WBC: 4.7 10*3/uL (ref 4.0–10.5)

## 2020-07-02 LAB — LIPID PANEL
Cholesterol: 165 mg/dL (ref 0–200)
HDL: 89 mg/dL (ref >39.00)
LDL Cholesterol: 66 mg/dL (ref 0–99)
NonHDL: 75.91
Total CHOL/HDL Ratio: 2
Triglycerides: 50 mg/dL (ref 0.0–149.0)
VLDL: 10 mg/dL (ref 0.0–40.0)

## 2020-07-02 LAB — PSA: PSA: 1.12 ng/mL (ref 0.10–4.00)

## 2020-07-02 LAB — VITAMIN D 25 HYDROXY (VIT D DEFICIENCY, FRACTURES): VITD: 23.94 ng/mL — ABNORMAL LOW (ref 30.00–100.00)

## 2020-07-02 LAB — HEMOGLOBIN A1C: Hgb A1c MFr Bld: 5.6 % (ref 4.6–6.5)

## 2020-07-06 ENCOUNTER — Other Ambulatory Visit: Payer: Self-pay

## 2020-07-06 ENCOUNTER — Ambulatory Visit (INDEPENDENT_AMBULATORY_CARE_PROVIDER_SITE_OTHER): Payer: Medicare Other | Admitting: Family Medicine

## 2020-07-06 ENCOUNTER — Encounter: Payer: Self-pay | Admitting: Family Medicine

## 2020-07-06 ENCOUNTER — Encounter (HOSPITAL_COMMUNITY): Payer: Self-pay

## 2020-07-06 VITALS — BP 134/80 | HR 85 | Temp 97.9°F | Ht 67.0 in | Wt 189.4 lb

## 2020-07-06 DIAGNOSIS — N400 Enlarged prostate without lower urinary tract symptoms: Secondary | ICD-10-CM | POA: Diagnosis not present

## 2020-07-06 DIAGNOSIS — I2510 Atherosclerotic heart disease of native coronary artery without angina pectoris: Secondary | ICD-10-CM

## 2020-07-06 DIAGNOSIS — R7303 Prediabetes: Secondary | ICD-10-CM

## 2020-07-06 DIAGNOSIS — M4800 Spinal stenosis, site unspecified: Secondary | ICD-10-CM

## 2020-07-06 DIAGNOSIS — E663 Overweight: Secondary | ICD-10-CM | POA: Diagnosis not present

## 2020-07-06 DIAGNOSIS — E559 Vitamin D deficiency, unspecified: Secondary | ICD-10-CM

## 2020-07-06 DIAGNOSIS — G992 Myelopathy in diseases classified elsewhere: Secondary | ICD-10-CM

## 2020-07-06 DIAGNOSIS — M4802 Spinal stenosis, cervical region: Secondary | ICD-10-CM

## 2020-07-06 DIAGNOSIS — M5416 Radiculopathy, lumbar region: Secondary | ICD-10-CM | POA: Diagnosis not present

## 2020-07-06 DIAGNOSIS — I4819 Other persistent atrial fibrillation: Secondary | ICD-10-CM

## 2020-07-06 DIAGNOSIS — E7800 Pure hypercholesterolemia, unspecified: Secondary | ICD-10-CM

## 2020-07-06 NOTE — Assessment & Plan Note (Signed)
Congratulated on ongoing weight loss. 

## 2020-07-06 NOTE — Progress Notes (Signed)
Patient ID: [name], male    DOB: 1950-07-24, 69 y.o.   MRN: [id number]  This visit was conducted in person.  BP 134/80 (BP Location: Left Arm, Patient Position: Sitting, Cuff Size: Normal)   Pulse 85   Temp 97.9 F (36.6 C) (Temporal)   Ht 5\' 7"  (1.702 m)   Wt 189 lb 6 oz (85.9 kg)   SpO2 98%   BMI 29.66 kg/m    CC:  9 mo f/u visit  Subjective:   HPI: [name] is a 69 y.o. male presenting on 07/06/2020 for Follow-up (Here for 9 mo f/u.)   Established with cardiology for persistent afib on eliquis bid - decided to pursue rate control. Lisinopril increased to 10mg  daily. Seeing Dr Quentin Ore. Has had failed cardioversion.   Known cervical and lumbar SS s/p recent decompressive C1/2 laminectomy with posterior instrumentation fusion from C1-4 [name]). Notes progressive symptom improvement. Upcoming lumbar spine surgery for chronic R leg pain/weakness.   Twisted R shoulder blade muscle last week while turning to look behind him in car. Declines muscle relaxant.   BPH with incomplete bladder emptying followed by urology pending surgery.      Relevant past medical, surgical, family and social history reviewed and updated as indicated. Interim medical history since our last visit reviewed. Allergies and medications reviewed and updated. Outpatient Medications Prior to Visit  Medication Sig Dispense Refill  . Albuterol Sulfate (PROAIR RESPICLICK) 094 (90 Base) MCG/ACT AEPB Inhale 2 puffs into the lungs every 6 (six) hours as needed (cough, shortness of breath). 1 each 3  . apixaban (ELIQUIS) 5 MG TABS tablet Take 1 tablet (5 mg total) by mouth 2 (two) times daily. 60 tablet 5  . atorvastatin (LIPITOR) 10 MG tablet TAKE 1 TABLET(10 MG) BY MOUTH DAILY (Patient taking differently: Take 10 mg by mouth daily.) 90 tablet 3  . budesonide-formoterol (SYMBICORT) 80-4.5 MCG/ACT inhaler Inhale 2 puffs into the lungs 2 (two) times daily as needed (shortness of breath).    [name]  lisinopril (ZESTRIL) 10 MG tablet Take 1 tablet (10 mg total) by mouth daily. 30 tablet 3  . metoprolol tartrate (LOPRESSOR) 50 MG tablet Take 1 tablet (50 mg total) by mouth 2 (two) times daily. 60 tablet 5  . naproxen sodium (ALEVE) 220 MG tablet Take 440 mg by mouth daily as needed (pain).    . tamsulosin (FLOMAX) 0.4 MG CAPS capsule Take 1 capsule (0.4 mg total) by mouth daily. 30 capsule 3   No facility-administered medications prior to visit.     Per HPI unless specifically indicated in ROS section below Review of Systems Objective:  BP 134/80 (BP Location: Left Arm, Patient Position: Sitting, Cuff Size: Normal)   Pulse 85   Temp 97.9 F (36.6 C) (Temporal)   Ht 5\' 7"  (1.702 m)   Wt 189 lb 6 oz (85.9 kg)   SpO2 98%   BMI 29.66 kg/m   Wt Readings from Last 3 Encounters:  07/06/20 189 lb 6 oz (85.9 kg)  06/30/20 192 lb (87.1 kg)  05/22/20 192 lb (87.1 kg)      Physical Exam Vitals and nursing note reviewed.  Constitutional:      Appearance: Normal appearance. He is not ill-appearing.  Eyes:     Extraocular Movements: Extraocular movements intact.     Pupils: Pupils are equal, round, and reactive to light.  Cardiovascular:     Rate and Rhythm: Normal rate. Rhythm irregularly irregular.     Pulses:  Normal pulses.     Heart sounds: Normal heart sounds. No murmur heard.   Pulmonary:     Effort: Pulmonary effort is normal. No respiratory distress.     Breath sounds: Normal breath sounds. No wheezing, rhonchi or rales.  Musculoskeletal:        General: Normal range of motion.     Right lower leg: No edema.     Left lower leg: No edema.  Skin:    General: Skin is warm and dry.     Findings: No rash.  Neurological:     Mental Status: He is alert.  Psychiatric:        Mood and Affect: Mood normal.        Behavior: Behavior normal.       Results for orders placed or performed in visit on 07/02/20  PSA  Result Value Ref Range   PSA 1.12 0.10 - 4.00 ng/mL  CBC  with Differential/Platelet  Result Value Ref Range   WBC 4.7 4.0 - 10.5 K/uL   RBC 3.87 (L) 4.22 - 5.81 Mil/uL   Hemoglobin 13.1 13.0 - 17.0 g/dL   HCT 38.7 (L) 39.0 - 52.0 %   MCV 99.9 78.0 - 100.0 fl   MCHC 34.0 30.0 - 36.0 g/dL   RDW 14.8 11.5 - 15.5 %   Platelets 232.0 150.0 - 400.0 K/uL   Neutrophils Relative % 39.4 (L) 43.0 - 77.0 %   Lymphocytes Relative 47.8 (H) 12.0 - 46.0 %   Monocytes Relative 9.4 3.0 - 12.0 %   Eosinophils Relative 2.8 0.0 - 5.0 %   Basophils Relative 0.6 0.0 - 3.0 %   Neutro Abs 1.9 1.4 - 7.7 K/uL   Lymphs Abs 2.2 0.7 - 4.0 K/uL   Monocytes Absolute 0.4 0.1 - 1.0 K/uL   Eosinophils Absolute 0.1 0.0 - 0.7 K/uL   Basophils Absolute 0.0 0.0 - 0.1 K/uL  VITAMIN D 25 Hydroxy (Vit-D Deficiency, Fractures)  Result Value Ref Range   VITD 23.94 (L) 30.00 - 100.00 ng/mL  Hemoglobin A1c  Result Value Ref Range   Hgb A1c MFr Bld 5.6 4.6 - 6.5 %  Comprehensive metabolic panel  Result Value Ref Range   Sodium 135 135 - 145 mEq/L   Potassium 4.7 3.5 - 5.1 mEq/L   Chloride 100 96 - 112 mEq/L   CO2 27 19 - 32 mEq/L   Glucose, Bld 130 (H) 70 - 99 mg/dL   BUN 9 6 - 23 mg/dL   Creatinine, Ser 0.91 0.40 - 1.50 mg/dL   Total Bilirubin 1.0 0.2 - 1.2 mg/dL   Alkaline Phosphatase 78 39 - 117 U/L   AST 14 0 - 37 U/L   ALT 17 0 - 53 U/L   Total Protein 6.7 6.0 - 8.3 g/dL   Albumin 4.4 3.5 - 5.2 g/dL   GFR 86.30 >60.00 mL/min   Calcium 9.7 8.4 - 10.5 mg/dL  Lipid panel  Result Value Ref Range   Cholesterol 165 0 - 200 mg/dL   Triglycerides 50.0 0.0 - 149.0 mg/dL   HDL 89.00 >39.00 mg/dL   VLDL 10.0 0.0 - 40.0 mg/dL   LDL Cholesterol 66 0 - 99 mg/dL   Total CHOL/HDL Ratio 2    NonHDL 75.91    Assessment & Plan:  This visit occurred during the SARS-CoV-2 public health emergency.  Safety protocols were in place, including screening questions prior to the visit, additional usage of staff PPE, and extensive cleaning of exam room  while observing appropriate contact  time as indicated for disinfecting solutions.   Problem List Items Addressed This Visit    Vitamin D deficiency    Discussed vit D replacement during winter months.       Spinal stenosis   Prediabetes    Latest A1c normal range at 5.6% with weight loss efforts. Congratulated!      Persistent atrial fibrillation ([place]) - Primary    Appreciate cardiology care. Has failed cardioversion. Planned ongoing rate control with anticoagulant use.       Overweight (BMI 25.0-29.9)    Congratulated on ongoing weight loss.       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       HLD (hyperlipidemia)    Chronic, stable on lipitor - continue. The 10-year ASCVD risk score Mikey Bussing DC Brooke Bonito., et al., 2013) is: 32.1%   Values used to calculate the score:     Age: 12 years     Sex: Male     Is Non-Hispanic African American: No     Diabetic: Yes     Tobacco smoker: Yes     Systolic Blood Pressure: 340 mmHg     Is BP treated: Yes     HDL Cholesterol: 89 mg/dL     Total Cholesterol: 165 mg/dL       BPH (benign prostatic hyperplasia)    Established with urology - considering surgical intervention.           No orders of the defined types were placed in this encounter.  No orders of the defined types were placed in this encounter.   Patient Instructions  Bring me copy of your living will Return as needed or in Summer for physical.  Good to see you today.   Follow up plan: Return in about 6 months (around 01/04/2021) for annual exam, prior fasting for blood work, medicare wellness visit.  Ria Bush, MD

## 2020-07-06 NOTE — Assessment & Plan Note (Signed)
Established with urology - considering surgical intervention.

## 2020-07-06 NOTE — Assessment & Plan Note (Signed)
Planned lumbar surgery later this month.

## 2020-07-06 NOTE — Assessment & Plan Note (Addendum)
Appreciate cardiology care. Has failed cardioversion. Planned ongoing rate control with anticoagulant use.

## 2020-07-06 NOTE — Pre-Procedure Instructions (Signed)
[name]  07/06/2020      [id number] [name], [place] [address] [place] [address] Phone: [phone] Fax: [phone]    Your procedure is scheduled on 07/15/20.  Report to [facility]mitting at 530 A.M.  Call this number if you have problems the morning of surgery:  [phone]   Remember:  Do not eat or drink after midnight.     Take these medicines the morning of surgery with A SIP OF WATER -----ALL INHALERS,METOPROLOL,FLOMAX    Do not wear jewelry, make-up or nail polish.  Do not wear lotions, powders, or perfumes, or deodorant.  Do not shave 48 hours prior to surgery.  Men may shave face and neck.  Do not bring valuables to the hospital.  [facility] is not responsible for any belongings or valuables.  Contacts, dentures or bridgework may not be worn into surgery.  Leave your suitcase in the car.  After surgery it may be brought to your room.  For patients admitted to the hospital, discharge time will be determined by your treatment team.  Patients discharged the day of surgery will not be allowed to drive home.  [facility] - Preparing for Surgery  Before surgery, you can play an important role.  Because skin is not sterile, your skin needs to be as free of germs as possible.  You can reduce the number of germs on you skin by washing with CHG (chlorahexidine gluconate) soap before surgery.  CHG is an antiseptic cleaner which kills germs and bonds with the skin to continue killing germs even after washing.  Oral Hygiene is also important in reducing the risk of infection.  Remember to brush your teeth with your regular toothpaste the morning of surgery.  Please DO NOT use if you have an allergy to CHG or antibacterial soaps.  If your skin becomes reddened/irritated stop using the CHG and inform your nurse when you arrive at Short Stay.  Do not  shave (including legs and underarms) for at least 48 hours prior to the first CHG shower.  You may shave your face.  Please follow these instructions carefully:   1.  Shower with CHG Soap the night before surgery and the morning of Surgery.  2.  If you choose to wash your hair, wash your hair first as usual with your normal shampoo.  3.  After you shampoo, rinse your hair and body thoroughly to remove the shampoo. 4.  Use CHG as you would any other liquid soap.  You can apply chg directly to the skin and wash gently with a      scrungie or washcloth.           5.  Apply the CHG Soap to your body ONLY FROM THE NECK DOWN.   Do not use on open wounds or open sores. Avoid contact with your eyes, ears, mouth and genitals (private parts).  Wash genitals (private parts) with your normal soap.  6.  Wash thoroughly, paying special attention to the area where your surgery will be performed.  7.  Thoroughly rinse your body with warm water from the neck down.  8.  DO NOT shower/wash with your normal soap after using and rinsing off the CHG Soap.  9.  Pat yourself dry with a clean towel.            10.  Wear clean pajamas.            11.  Place clean sheets on your bed the night of your first shower and do not sleep with pets.  Day of Surgery  Do not apply any lotions/deoderants the morning of surgery.   Please wear clean clothes to the hospital/surgery center. Remember to brush your teeth with toothpaste.    Special instructions:  Do not take any aspirin,anti-inflammatories,vitamins,or herbal supplements 5-7 days prior to surgery.  Please read over the following fact sheets that you were given. Coughing and Deep Breathing

## 2020-07-06 NOTE — Patient Instructions (Addendum)
Bring me copy of your living will Return as needed or in Summer for physical.  Good to see you today.

## 2020-07-06 NOTE — Assessment & Plan Note (Signed)
Latest A1c normal range at 5.6% with weight loss efforts. Congratulated!

## 2020-07-06 NOTE — Assessment & Plan Note (Signed)
Chronic, stable on lipitor - continue. The 10-year ASCVD risk score Mikey Bussing DC Brooke Bonito., et al., 2013) is: 32.1%   Values used to calculate the score:     Age: 69 years     Sex: Male     Is Non-Hispanic African American: No     Diabetic: Yes     Tobacco smoker: Yes     Systolic Blood Pressure: 444 mmHg     Is BP treated: Yes     HDL Cholesterol: 89 mg/dL     Total Cholesterol: 165 mg/dL

## 2020-07-06 NOTE — Assessment & Plan Note (Addendum)
Significant improvement after recent surgical decompression [name])

## 2020-07-06 NOTE — Assessment & Plan Note (Signed)
Discussed vit D replacement during winter months.

## 2020-07-07 ENCOUNTER — Encounter (HOSPITAL_COMMUNITY)
Admission: RE | Admit: 2020-07-07 | Discharge: 2020-07-07 | Disposition: A | Discharge status: Home / Self Care | Payer: Medicare Other | Source: Ambulatory Visit | Attending: Neurosurgery | Admitting: Neurosurgery

## 2020-07-07 ENCOUNTER — Other Ambulatory Visit: Payer: Self-pay

## 2020-07-07 ENCOUNTER — Encounter (HOSPITAL_COMMUNITY): Payer: Self-pay

## 2020-07-07 DIAGNOSIS — Z01812 Encounter for preprocedural laboratory examination: Secondary | ICD-10-CM | POA: Diagnosis not present

## 2020-07-07 LAB — SURGICAL PCR SCREEN
MRSA, PCR: NEGATIVE
Staphylococcus aureus: NEGATIVE

## 2020-07-07 NOTE — Progress Notes (Addendum)
Anesthesia Chart Review:  Case: [id number] Date/Time: 07/15/20 0713   Procedure: LAMINECTOMY AND FORAMINOTOMY BILATERAL L2-L3 (Bilateral ) - 3C   Anesthesia type: General   Pre-op diagnosis: CHRONIC RIGHT-SIDED LOW BACK PAIN WITH RIGHT-SIDED SCIATICA   Location: [place] OR ROOM 20 / [place] [facility]   Surgeons: Newman Pies, MD      DISCUSSION: Patient is a 69 year old male scheduled for the above procedure. S/p posterior cervical fusion 05/14/20.   History includes smoking, COPD, HTN, HLD, afib (diagnosed 03/2020; unsuccessful DCCV 05/06/20), PVCs, mild CAD (2016 LHC), exertional dyspnea, ETOH abuse, migraines, BPH, neck surgery (C3-7 ACDF 06/26/02; C1-4 posterolateral arthrodesis/instrumentation 05/14/20), back surgery (left L3-4 laminectomy 02/14/06, redo 03/27/13), likely cleared Hep C (based on 09/05/15 labs), right TKA (06/27/14). BMI is consistent with mild obesity.   Last PCP visit 07/06/20 with Ria Bush, MD.  He is aware of surgery plans. BP 134/80.  Last visit with cardiolgoist Dr. Garen Lah on 05/22/20 for afib follow-up which had been discovered in September 2021. He had been cleared for his 05/14/20 cervical fusion. At latest visit, patient doing well after surgery, no chest pain. Given failed DCCV , he referred patient to EP. Mr. Koegler had initial EP cardiology evaluation with Dr. Quentin Ore on 06/30/20.  Unknown duration of afib, but completely asymptomatic.  LVEF low normal at 45 to 50%.  Tolerating Eliquis without any bleeding issues.  Treatment options discussed and including continue conservative management with stroke prophylaxis versus attempt at rhythm control using antiarrhythmic medications or ablation. He was not felt to be a good ablation candidate given COPD and ongoing tobacco abuse, and conservative strategy with rate control/anticoagulation favored given asymptomatic.  If he develops symptoms of worsening LV function that is thought related to afib then would consider initiation  of antiarrhythmic. Lisinopril increased to 10 mg daily. Per [facility] pharmacist, per office protocol, he can hold Eliquis for 3 days prior to this procedure.   He reported drinking 3 beers daily. 07/02/20 labs showed normal PLT count, AST, ALT, total bilirubin. Cr 0.91, H/H 13.1/38.7.   BP elevated at 154/100, but 134/80 at his PCP yesterday. Lisinopril recently increased to 10 mg daily and also on Lopressor 50 mg BID. Nikki notified at Dr. [name]' office. Patient will get vitals on arrival for surgery.  Presurgical COVID-19 test is scheduled for 07/13/20. Anesthesia team to evaluate on the day of surgery.    VS: BP (!) 154/100   Pulse 78   Temp 36.6 C   Resp 17   Ht 5\' 7"  (1.702 m)   Wt 87.1 kg   SpO2 100%   BMI 30.09 kg/m  BP Readings from Last 3 Encounters:  07/07/20 (!) 154/100  07/06/20 134/80  06/30/20 (!) 146/90    PROVIDERS: Ria Bush, MD is PCP  Kate Sable, MD is cardiologist Lars Mage, MD is EP cardiologist   LABS: He had labs on 07/02/20. Results included: Lab Results  Component Value Date   WBC 4.7 07/02/2020   HGB 13.1 07/02/2020   HCT 38.7 (L) 07/02/2020   PLT 232.0 07/02/2020   GLUCOSE 130 (H) 07/02/2020   ALT 17 07/02/2020   AST 14 07/02/2020   NA 135 07/02/2020   K 4.7 07/02/2020   CL 100 07/02/2020   CREATININE 0.91 07/02/2020   BUN 9 07/02/2020   CO2 27 07/02/2020   TSH 3.42 03/27/2020   PSA 1.12 07/02/2020   HGBA1C 5.6 07/02/2020     IMAGES: CXR 03/27/20: FINDINGS: The heart size and mediastinal contours are  within normal limits. Aortic atherosclerosis noted. Pulmonary hyperinflation again seen, consistent with COPD. Both lungs are clear. Cervical spine fusion hardware again noted. IMPRESSION: COPD. No active cardiopulmonary disease.  MRI L-spine 02/21/20: IMPRESSION: 1. Multilevel spondylosis, progressed since prior exam. 2. New inferiorly oriented right paracentral L2-3 protrusion with severe spinal canal and  bilateral neural foraminal narrowing. 3. Moderate right L3-5 and severe left L3-4 neural foraminal narrowing. 4. Mild degenerative related periarticular inflammation involving the right L1-3 and left L2-3 facet joints.  US Renal 12/28/17: IMPRESSION: 1. Right renal cysts as noted above.  No solid renal lesion. 2. No hydronephrosis.   EKG:  EKG 06/30/20: Atrial fibrillation at 88 bpm   CV: Echo 03/31/20: IMPRESSIONS  1. Left ventricular ejection fraction, by estimation, is 45 to 50%. The  left ventricle has mildly decreased function. The left ventricle  demonstrates global hypokinesis. There is mild left ventricular  hypertrophy. Left ventricular diastolic parameters  are indeterminate.  2. Right ventricular systolic function is mildly reduced. The right  ventricular size is mildly enlarged. Tricuspid regurgitation signal is  inadequate for assessing PA pressure.  3. Right atrial size was mildly dilated.  4. The mitral valve is normal in structure. Trivial mitral valve  regurgitation.  5. The aortic valve is tricuspid. Aortic valve regurgitation is not  visualized. No aortic stenosis is present.  6. The inferior vena cava is normal in size with greater than 50%  respiratory variability, suggesting right atrial pressure of 3 mmHg.    Cardiac cath 07/01/15:  The left ventricular systolic function is normal.  Prox LAD to Mid LAD lesion, 30% stenosed.  Prox RCA to Mid RCA lesion, 20% stenosed. 1. Mildly calcified coronary arteries with mild two-vessel coronary artery disease involving the proximal to mid LAD and proximal to midright coronary artery. No evidence of obstructive disease. 2. Normal LV systolic function. 3. Moderately elevated left ventricular end-diastolic pressure at 24 mmHg.   Abdominal Aorta US 10/10/16: Impressions: Technically challenging, poor study. Normal caliber blood flow velocity proximal aorta. Patent IVC. All other images suffered from  poor resolution and color blooming artifact.   Past Medical History:  Diagnosis Date  . Allergic rhinitis 07/1998  . Aortic atherosclerosis ([place]) 09/2016   by CT  . Arthritis    BACK AND RIGHT KNEE  . CAD (coronary artery disease) 09/2016   RCA, LAD by CT  . COPD (chronic obstructive pulmonary disease) ([place]) 07/1998   centrilobular emphysema (09/2016) w ongoing tobacco use, spirometry (02/2013)  . Dysrhythmia    PVC'S  . ETOH abuse   . Headache    migraine- x1  . Hepatitis    pos test once not now,  Positive   . HTN (hypertension) 05/1998  . Hyperlipidemia 06/1996  . Lumbar spinal stenosis 03/2013   s/p laminectomy  . 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 Shortness of breath dyspnea    WITH EXERTION  . Smoker     Past Surgical History:  Procedure Laterality Date  . ANTERIOR CERVICAL DECOMP/DISCECTOMY FUSION  2003   C3-7  . BACK SURGERY  2007  . CARDIAC CATHETERIZATION N/A 07/01/2015   Procedure: Left Heart Cath and Coronary Angiography;  Surgeon: Wellington Hampshire, MD;  Location: [place] CV LAB;  Service: Cardiovascular;  Laterality: N/A;  . CARDIOVERSION N/A 05/06/2020   Procedure: CARDIOVERSION;  Surgeon: Kate Sable, MD;  Location:  [facility] ORS;  Service: Cardiovascular;  Laterality: N/A;  . COLONOSCOPY  09/2010   HPx2, diverticula, hemorrhoids, rpt 10 yrs [place])  . ETT  2010   normal, ABIs normal  . JOINT REPLACEMENT    . KNEE ARTHROSCOPY Right 11   cartlage, ligaments  . KNEE ARTHROSCOPY Left 84  . LUMBAR LAMINECTOMY/DECOMPRESSION MICRODISCECTOMY Left 03/27/2013   Procedure: LUMBAR LAMINECTOMY/DECOMPRESSION MICRODISCECTOMY 2 LEVELS;  Surgeon: Ophelia Charter, MD;  Location: [place] [facility] ORS;  Service: Neurosurgery;  Laterality: Left;  LEFT Lumbar Three Four diskectomy with Lumbar Three-Four, Four-Five Laminectomy. L3/4 ahd L4/5 for LSS [name])   . POSTERIOR  CERVICAL FUSION/FORAMINOTOMY N/A 05/14/2020   Procedure: LAMINECTOMY, POSTERIOR INSTRUMENTATION AND FUSION, CERVICAL ONE- CERVICAL TWO, CERVICAL TWO- CERVICAL THREE;  Surgeon: Newman Pies, MD;  Location: [place];  Service: Neurosurgery;  Laterality: N/A;  . SHOULDER ARTHROSCOPY Right 11  . spriometry  02/2013   COPD with some restriction thought to be obesity related ([place])  . TOTAL KNEE ARTHROPLASTY Right 06/27/2014   Procedure: RIGHT TOTAL KNEE ARTHROPLASTY;  Surgeon: Mcarthur Rossetti, MD;  Location: [facility] ORS;  Service: Orthopedics;  Laterality: Right;  . US ECHOCARDIOGRAPHY  01/2013   WNL, mild diastolic dysfunction    MEDICATIONS: . Albuterol Sulfate (PROAIR RESPICLICK) 123XX123 (90 Base) MCG/ACT AEPB  . apixaban (ELIQUIS) 5 MG TABS tablet  . atorvastatin (LIPITOR) 10 MG tablet  . budesonide-formoterol (SYMBICORT) 80-4.5 MCG/ACT inhaler  . lisinopril (ZESTRIL) 10 MG tablet  . metoprolol tartrate (LOPRESSOR) 50 MG tablet  . naproxen sodium (ALEVE) 220 MG tablet  . tamsulosin (FLOMAX) 0.4 MG CAPS capsule   No current facility-administered medications for this encounter.    Myra Gianotti, PA-C Surgical Short Stay/Anesthesiology [facility] Phone [phone] [facility] Phone [phone] 07/08/2020 10:52 AM

## 2020-07-07 NOTE — Anesthesia Preprocedure Evaluation (Addendum)
Anesthesia Evaluation  Patient identified by MRN, date of birth, ID band Patient awake    Reviewed: Allergy & Precautions, NPO status , Patient's Chart, lab work & pertinent test results, reviewed documented beta blocker date and time   Airway Mallampati: III  TM Distance: >3 FB Neck ROM: Limited    Dental  (+) Teeth Intact, Dental Advisory Given   Pulmonary COPD,  COPD inhaler, Current SmokerPatient did not abstain from smoking.,    Pulmonary exam normal breath sounds clear to auscultation       Cardiovascular hypertension, Pt. on home beta blockers and Pt. on medications + CAD and + Peripheral Vascular Disease  + dysrhythmias Atrial Fibrillation  Rhythm:Irregular Rate:Abnormal  LVEF low normal at 45 to 50%   Neuro/Psych  Headaches, Right sided sciatica S/p posterior cervical fusion 05/14/20  Neuromuscular disease    GI/Hepatic negative GI ROS, (+) Hepatitis - (h/o HCV), C  Endo/Other  Obesity   Renal/GU Renal disease     Musculoskeletal  (+) Arthritis ,   Abdominal   Peds  Hematology  (+) Blood dyscrasia (Eliquis), ,   Anesthesia Other Findings   Reproductive/Obstetrics                         Anesthesia Physical Anesthesia Plan  ASA: III  Anesthesia Plan: General   Post-op Pain Management:    Induction: Intravenous  PONV Risk Score and Plan: 2 and Dexamethasone and Ondansetron  Airway Management Planned: Oral ETT and Video Laryngoscope Planned  Additional Equipment:   Intra-op Plan:   Post-operative Plan: Extubation in OR  Informed Consent: I have reviewed the patients History and Physical, chart, labs and discussed the procedure including the risks, benefits and alternatives for the proposed anesthesia with the patient or authorized representative who has indicated his/her understanding and acceptance.     Dental advisory given  Plan Discussed with: CRNA  Anesthesia  Plan Comments: (PAT note written by Myra Gianotti, PA-C. )     Anesthesia Quick Evaluation

## 2020-07-07 NOTE — Progress Notes (Signed)
PCP - gutierrez @ [place], Dr. Quentin Ore   Chest x-ray - 03/27/20 EKG - 06/30/20 Stress Test - 10-15 yrs. ago ECHO - 9/21 Cardiac Cath - 12/16  Sleep Study -  na   Blood Thinner Instructions: stop eliquis 3 days prior to surgery Aspirin Instructions:  na   COVID TEST-  12/27 at [place]   Anesthesia review: ekg/cardiac hx.  Patient denies shortness of breath, fever, cough and chest pain at PAT appointment   All instructions explained to the patient, with a verbal understanding of the material. Patient agrees to go over the instructions while at home for a better understanding. Patient also instructed to self quarantine after being tested for COVID-19. The opportunity to ask questions was provided.

## 2020-07-07 NOTE — Pre-Procedure Instructions (Signed)
DANILO CAPPIELLO  07/07/2020      [id number] [name], [place] - [place] [address] [place] [address] Phone: [phone] Fax: [phone]    Your procedure is scheduled on 07/15/20.  Report to [facility] Admitting at 530 A.M.  Call this number if you have problems the morning of surgery:  [phone]   Remember:  Do not eat or drink after midnight.     Take these medicines the morning of surgery with A SIP OF WATER -----ALL INHALERS,METOPROLOL,FLOMAX,atorvastation/lipitor, flomax(tamsulosin)       7 days prior to surgery STOP taking any Aspirin (unless otherwise instructed by your surgeon), Aleve, Naproxen, Ibuprofen, Motrin, Advil, Goody's, BC's, all herbal medications, fish oil, and all vitamins.                 Follow your surgeon's instructions on when to stop (apixaban/eliquis).  If no instructions were given by your surgeon then you will need to call the office to get those instructions.      Do not wear jewelry.  Do not wear lotions, powders, or perfumes, or deodorant.  Do not shave 48 hours prior to surgery.  Men may shave face and neck.  Do not bring valuables to the hospital.  [facility] is not responsible for any belongings or valuables.  Contacts, dentures or bridgework may not be worn into surgery.  Leave your suitcase in the car.  After surgery it may be brought to your room.  For patients admitted to the hospital, discharge time will be determined by your treatment team.  Patients discharged the day of surgery will not be allowed to drive home.  [place] - Preparing for Surgery  Before surgery, you can play an important role.  Because skin is not sterile, your skin needs to be as free of germs as possible.  You can reduce the number of germs on you skin by washing with CHG (chlorahexidine gluconate) soap before surgery.  CHG is an antiseptic cleaner  which kills germs and bonds with the skin to continue killing germs even after washing.  Oral Hygiene is also important in reducing the risk of infection.  Remember to brush your teeth with your regular toothpaste the morning of surgery.  Please DO NOT use if you have an allergy to CHG or antibacterial soaps.  If your skin becomes reddened/irritated stop using the CHG and inform your nurse when you arrive at Short Stay.  Do not shave (including legs and underarms) for at least 48 hours prior to the first CHG shower.  You may shave your face.  Please follow these instructions carefully:   1.  Shower with CHG Soap the night before surgery and the morning of Surgery.  2.  If you choose to wash your hair, wash your hair first as usual with your normal shampoo.  3.  After you shampoo, rinse your hair and body thoroughly to remove the shampoo. 4.  Use CHG as you would any other liquid soap.  You can apply chg directly to the skin and wash gently with a      scrungie or washcloth.           5.  Apply the CHG Soap to your body ONLY FROM THE NECK DOWN.   Do not use on open wounds or open sores. Avoid contact with your eyes, ears, mouth and genitals (private parts).  Wash genitals (private parts) with your normal soap.  6.  Wash thoroughly, paying special attention to the area where your surgery will be performed.  7.  Thoroughly rinse your body with warm water from the neck down.  8.  DO NOT shower/wash with your normal soap after using and rinsing off the CHG Soap.  9.  Pat yourself dry with a clean towel.            10.  Wear clean pajamas.            11.  Place clean sheets on your bed the night of your first shower and do not sleep with pets.  Day of Surgery  Do not apply any lotions/deoderants the morning of surgery.   Please wear clean clothes to the hospital/surgery center. Remember to brush your teeth with toothpaste.    Special instructions:  Do not take any  aspirin,anti-inflammatories,vitamins,or herbal supplements 5-7 days prior to surgery.  Please read over the following fact sheets that you were given.

## 2020-07-13 ENCOUNTER — Other Ambulatory Visit
Admission: RE | Admit: 2020-07-13 | Discharge: 2020-07-13 | Disposition: A | Discharge status: Home / Self Care | Payer: Medicare Other | Source: Ambulatory Visit | Attending: Neurosurgery | Admitting: Neurosurgery

## 2020-07-13 ENCOUNTER — Other Ambulatory Visit: Payer: Self-pay

## 2020-07-13 DIAGNOSIS — Z01812 Encounter for preprocedural laboratory examination: Secondary | ICD-10-CM | POA: Diagnosis not present

## 2020-07-13 DIAGNOSIS — Z20822 Contact with and (suspected) exposure to covid-19: Secondary | ICD-10-CM | POA: Insufficient documentation

## 2020-07-14 LAB — SARS CORONAVIRUS 2 (TAT 6-24 HRS): SARS Coronavirus 2: NEGATIVE

## 2020-07-14 NOTE — Telephone Encounter (Signed)
[facility] checking on status Surgery is 07/15/20

## 2020-07-14 NOTE — Telephone Encounter (Signed)
   Primary Cardiologist: Debbe Odea, MD  Chart reviewed as part of pre-operative protocol coverage. Given past medical history and time since last visit, based on ACC/AHA guidelines, [name] would be at acceptable risk for the planned procedure without further cardiovascular testing.   He is on Eliquis for anticoagulation due to atrial fibrillation with CHADS2vASc score of 3, CrCl >100. Per PharmD and office protocol, may hold Eliquis 3 days prior to procedure.   The patient was advised that if he develops new symptoms prior to surgery to contact our office to arrange for a follow-up visit, and he verbalized understanding.  I will route this recommendation to the requesting party via Epic fax function and remove from pre-op pool.  Please call with questions.  Alver Sorrow, NP 07/14/2020, 11:34 AM

## 2020-07-15 ENCOUNTER — Ambulatory Visit (HOSPITAL_COMMUNITY): Payer: Medicare Other

## 2020-07-15 ENCOUNTER — Ambulatory Visit (HOSPITAL_COMMUNITY)
Admission: RE | Admit: 2020-07-15 | Discharge: 2020-07-15 | Disposition: A | Discharge status: Home / Self Care | Payer: Medicare Other | Attending: Neurosurgery | Admitting: Neurosurgery

## 2020-07-15 ENCOUNTER — Ambulatory Visit (HOSPITAL_COMMUNITY): Payer: Medicare Other | Admitting: Anesthesiology

## 2020-07-15 ENCOUNTER — Other Ambulatory Visit: Payer: Self-pay

## 2020-07-15 ENCOUNTER — Encounter (HOSPITAL_COMMUNITY): Payer: Self-pay | Admitting: Neurosurgery

## 2020-07-15 ENCOUNTER — Ambulatory Visit (HOSPITAL_COMMUNITY): Payer: Medicare Other | Admitting: Vascular Surgery

## 2020-07-15 ENCOUNTER — Encounter (HOSPITAL_COMMUNITY)
Admission: RE | Disposition: A | Discharge status: Home / Self Care | Payer: Self-pay | Source: Home / Self Care | Attending: Neurosurgery

## 2020-07-15 DIAGNOSIS — M5441 Lumbago with sciatica, right side: Secondary | ICD-10-CM | POA: Diagnosis not present

## 2020-07-15 DIAGNOSIS — M47816 Spondylosis without myelopathy or radiculopathy, lumbar region: Secondary | ICD-10-CM | POA: Diagnosis not present

## 2020-07-15 DIAGNOSIS — R2689 Other abnormalities of gait and mobility: Secondary | ICD-10-CM | POA: Insufficient documentation

## 2020-07-15 DIAGNOSIS — Z79899 Other long term (current) drug therapy: Secondary | ICD-10-CM | POA: Diagnosis not present

## 2020-07-15 DIAGNOSIS — Z888 Allergy status to other drugs, medicaments and biological substances status: Secondary | ICD-10-CM | POA: Insufficient documentation

## 2020-07-15 DIAGNOSIS — F17210 Nicotine dependence, cigarettes, uncomplicated: Secondary | ICD-10-CM | POA: Insufficient documentation

## 2020-07-15 DIAGNOSIS — M48062 Spinal stenosis, lumbar region with neurogenic claudication: Secondary | ICD-10-CM | POA: Insufficient documentation

## 2020-07-15 DIAGNOSIS — Z7951 Long term (current) use of inhaled steroids: Secondary | ICD-10-CM | POA: Diagnosis not present

## 2020-07-15 DIAGNOSIS — Z419 Encounter for procedure for purposes other than remedying health state, unspecified: Secondary | ICD-10-CM

## 2020-07-15 DIAGNOSIS — M4802 Spinal stenosis, cervical region: Secondary | ICD-10-CM | POA: Diagnosis not present

## 2020-07-15 DIAGNOSIS — Z8719 Personal history of other diseases of the digestive system: Secondary | ICD-10-CM | POA: Diagnosis not present

## 2020-07-15 DIAGNOSIS — E785 Hyperlipidemia, unspecified: Secondary | ICD-10-CM | POA: Diagnosis not present

## 2020-07-15 DIAGNOSIS — I10 Essential (primary) hypertension: Secondary | ICD-10-CM | POA: Diagnosis not present

## 2020-07-15 DIAGNOSIS — Z7901 Long term (current) use of anticoagulants: Secondary | ICD-10-CM | POA: Insufficient documentation

## 2020-07-15 DIAGNOSIS — M5416 Radiculopathy, lumbar region: Secondary | ICD-10-CM | POA: Insufficient documentation

## 2020-07-15 HISTORY — PX: LUMBAR LAMINECTOMY/DECOMPRESSION MICRODISCECTOMY: SHX5026

## 2020-07-15 LAB — GLUCOSE, CAPILLARY: Glucose-Capillary: 130 mg/dL — ABNORMAL HIGH (ref 70–99)

## 2020-07-15 SURGERY — LUMBAR LAMINECTOMY/DECOMPRESSION MICRODISCECTOMY 1 LEVEL
Anesthesia: General | Laterality: Bilateral

## 2020-07-15 MED ORDER — ONDANSETRON HCL 4 MG PO TABS: 4.0000 mg | ORAL_TABLET | Freq: Four times a day (QID) | ORAL | Status: DC | PRN

## 2020-07-15 MED ORDER — THROMBIN 5000 UNITS EX SOLR
CUTANEOUS | Status: AC
  Filled 2020-07-15: qty 15000

## 2020-07-15 MED ORDER — LISINOPRIL 10 MG PO TABS: 10.0000 mg | ORAL_TABLET | Freq: Every day | ORAL | Status: DC

## 2020-07-15 MED ORDER — FENTANYL CITRATE (PF) 100 MCG/2ML IJ SOLN
INTRAMUSCULAR | Status: DC | PRN
  Administered 2020-07-15: 100 ug via INTRAVENOUS
  Administered 2020-07-15: 50 ug via INTRAVENOUS

## 2020-07-15 MED ORDER — LIDOCAINE HCL (CARDIAC) PF 100 MG/5ML IV SOSY
PREFILLED_SYRINGE | INTRAVENOUS | Status: DC | PRN
  Administered 2020-07-15: 80 mg via INTRAVENOUS

## 2020-07-15 MED ORDER — ACETAMINOPHEN 650 MG RE SUPP: 650.0000 mg | RECTAL | Status: DC | PRN

## 2020-07-15 MED ORDER — CHLORHEXIDINE GLUCONATE 0.12 % MT SOLN
15.0000 mL | Freq: Once | OROMUCOSAL | Status: AC
  Administered 2020-07-15: 06:00:00 15 mL via OROMUCOSAL
  Filled 2020-07-15: qty 15

## 2020-07-15 MED ORDER — ONDANSETRON HCL 4 MG/2ML IJ SOLN: 4.0000 mg | Freq: Four times a day (QID) | INTRAMUSCULAR | Status: DC | PRN

## 2020-07-15 MED ORDER — DOCUSATE SODIUM 100 MG PO CAPS: 100.0000 mg | ORAL_CAPSULE | Freq: Two times a day (BID) | ORAL | 0 refills | Status: DC

## 2020-07-15 MED ORDER — EPHEDRINE SULFATE 50 MG/ML IJ SOLN
INTRAMUSCULAR | Status: DC | PRN
  Administered 2020-07-15: 5 mg via INTRAVENOUS

## 2020-07-15 MED ORDER — PROPOFOL 10 MG/ML IV BOLUS
INTRAVENOUS | Status: DC | PRN
Start: 2020-07-15 — End: 2020-07-15
  Administered 2020-07-15: 150 mg via INTRAVENOUS

## 2020-07-15 MED ORDER — ROCURONIUM BROMIDE 100 MG/10ML IV SOLN
INTRAVENOUS | Status: DC | PRN
  Administered 2020-07-15: 70 mg via INTRAVENOUS

## 2020-07-15 MED ORDER — APIXABAN 5 MG PO TABS: 5.0000 mg | ORAL_TABLET | Freq: Two times a day (BID) | ORAL | 5 refills | Status: DC

## 2020-07-15 MED ORDER — ALBUTEROL SULFATE (2.5 MG/3ML) 0.083% IN NEBU: 2.5000 mg | INHALATION_SOLUTION | Freq: Four times a day (QID) | RESPIRATORY_TRACT | Status: DC | PRN

## 2020-07-15 MED ORDER — 0.9 % SODIUM CHLORIDE (POUR BTL) OPTIME
TOPICAL | Status: DC | PRN
  Administered 2020-07-15: 08:00:00 1000 mL

## 2020-07-15 MED ORDER — LACTATED RINGERS IV SOLN: INTRAVENOUS | Status: DC

## 2020-07-15 MED ORDER — ONDANSETRON HCL 4 MG/2ML IJ SOLN: 4.0000 mg | Freq: Once | INTRAMUSCULAR | Status: DC | PRN

## 2020-07-15 MED ORDER — FENTANYL CITRATE (PF) 250 MCG/5ML IJ SOLN
INTRAMUSCULAR | Status: AC
  Filled 2020-07-15: qty 5

## 2020-07-15 MED ORDER — METOPROLOL TARTRATE 50 MG PO TABS
50.0000 mg | ORAL_TABLET | Freq: Two times a day (BID) | ORAL | Status: DC
  Filled 2020-07-15: qty 1

## 2020-07-15 MED ORDER — MORPHINE SULFATE (PF) 4 MG/ML IV SOLN: 4.0000 mg | INTRAVENOUS | Status: DC | PRN

## 2020-07-15 MED ORDER — PHENYLEPHRINE HCL-NACL 10-0.9 MG/250ML-% IV SOLN
INTRAVENOUS | Status: DC | PRN
  Administered 2020-07-15: 75 ug/min via INTRAVENOUS

## 2020-07-15 MED ORDER — TAMSULOSIN HCL 0.4 MG PO CAPS: 0.4000 mg | ORAL_CAPSULE | Freq: Every day | ORAL | Status: DC

## 2020-07-15 MED ORDER — SUGAMMADEX SODIUM 200 MG/2ML IV SOLN
INTRAVENOUS | Status: DC | PRN
  Administered 2020-07-15: 200 mg via INTRAVENOUS

## 2020-07-15 MED ORDER — DOCUSATE SODIUM 100 MG PO CAPS: 100.0000 mg | ORAL_CAPSULE | Freq: Two times a day (BID) | ORAL | Status: DC

## 2020-07-15 MED ORDER — CYCLOBENZAPRINE HCL 10 MG PO TABS: 10.0000 mg | ORAL_TABLET | Freq: Three times a day (TID) | ORAL | Status: DC | PRN

## 2020-07-15 MED ORDER — CYCLOBENZAPRINE HCL 10 MG PO TABS: 10.0000 mg | ORAL_TABLET | Freq: Three times a day (TID) | ORAL | 0 refills | Status: DC | PRN

## 2020-07-15 MED ORDER — NAPROXEN SODIUM 275 MG PO TABS
440.0000 mg | ORAL_TABLET | Freq: Every day | ORAL | Status: DC | PRN
  Filled 2020-07-15: qty 2

## 2020-07-15 MED ORDER — CEFAZOLIN SODIUM-DEXTROSE 2-4 GM/100ML-% IV SOLN
2.0000 g | Freq: Three times a day (TID) | INTRAVENOUS | Status: DC
Start: 2020-07-15 — End: 2020-07-15
  Administered 2020-07-15: 14:00:00 2 g via INTRAVENOUS
  Filled 2020-07-15: qty 100

## 2020-07-15 MED ORDER — OXYCODONE HCL 10 MG PO TABS: 10.0000 mg | ORAL_TABLET | ORAL | 0 refills | Status: DC | PRN

## 2020-07-15 MED ORDER — OXYCODONE HCL 5 MG PO TABS: 5.0000 mg | ORAL_TABLET | ORAL | Status: DC | PRN

## 2020-07-15 MED ORDER — BACITRACIN ZINC 500 UNIT/GM EX OINT
TOPICAL_OINTMENT | CUTANEOUS | Status: AC
  Filled 2020-07-15: qty 28.35

## 2020-07-15 MED ORDER — MENTHOL 3 MG MT LOZG: 1.0000 | LOZENGE | OROMUCOSAL | Status: DC | PRN

## 2020-07-15 MED ORDER — OXYCODONE HCL 5 MG PO TABS
10.0000 mg | ORAL_TABLET | ORAL | Status: DC | PRN
  Administered 2020-07-15 (×2): 10 mg via ORAL
  Filled 2020-07-15 (×2): qty 2

## 2020-07-15 MED ORDER — BISACODYL 10 MG RE SUPP: 10.0000 mg | Freq: Every day | RECTAL | Status: DC | PRN

## 2020-07-15 MED ORDER — BACITRACIN ZINC 500 UNIT/GM EX OINT
TOPICAL_OINTMENT | CUTANEOUS | Status: DC | PRN
  Administered 2020-07-15: 1 via TOPICAL

## 2020-07-15 MED ORDER — PROPOFOL 10 MG/ML IV BOLUS
INTRAVENOUS | Status: AC
  Filled 2020-07-15: qty 20

## 2020-07-15 MED ORDER — FENTANYL CITRATE (PF) 100 MCG/2ML IJ SOLN: 25.0000 ug | INTRAMUSCULAR | Status: DC | PRN

## 2020-07-15 MED ORDER — PHENOL 1.4 % MT LIQD: 1.0000 | OROMUCOSAL | Status: DC | PRN

## 2020-07-15 MED ORDER — SODIUM CHLORIDE 0.9 % IV SOLN
250.0000 mL | INTRAVENOUS | Status: DC
  Administered 2020-07-15: 12:00:00 250 mL via INTRAVENOUS

## 2020-07-15 MED ORDER — ALBUMIN HUMAN 5 % IV SOLN: INTRAVENOUS | Status: DC | PRN

## 2020-07-15 MED ORDER — BUPIVACAINE-EPINEPHRINE (PF) 0.5% -1:200000 IJ SOLN
INTRAMUSCULAR | Status: DC | PRN
  Administered 2020-07-15 (×2): 10 mL via PERINEURAL

## 2020-07-15 MED ORDER — ORAL CARE MOUTH RINSE: 15.0000 mL | Freq: Once | OROMUCOSAL | Status: AC

## 2020-07-15 MED ORDER — ACETAMINOPHEN 500 MG PO TABS
1000.0000 mg | ORAL_TABLET | Freq: Once | ORAL | Status: AC
  Administered 2020-07-15: 06:00:00 1000 mg via ORAL
  Filled 2020-07-15: qty 2

## 2020-07-15 MED ORDER — CEFAZOLIN SODIUM-DEXTROSE 2-4 GM/100ML-% IV SOLN
2.0000 g | INTRAVENOUS | Status: AC
  Administered 2020-07-15: 08:00:00 2 g via INTRAVENOUS
  Filled 2020-07-15: qty 100

## 2020-07-15 MED ORDER — ACETAMINOPHEN 500 MG PO TABS
1000.0000 mg | ORAL_TABLET | Freq: Four times a day (QID) | ORAL | Status: DC
  Administered 2020-07-15: 12:00:00 1000 mg via ORAL
  Filled 2020-07-15: qty 2

## 2020-07-15 MED ORDER — CHLORHEXIDINE GLUCONATE CLOTH 2 % EX PADS: 6.0000 | MEDICATED_PAD | Freq: Once | CUTANEOUS | Status: DC

## 2020-07-15 MED ORDER — SODIUM CHLORIDE 0.9% FLUSH: 3.0000 mL | INTRAVENOUS | Status: DC | PRN

## 2020-07-15 MED ORDER — ACETAMINOPHEN 325 MG PO TABS: 650.0000 mg | ORAL_TABLET | ORAL | Status: DC | PRN

## 2020-07-15 MED ORDER — MOMETASONE FURO-FORMOTEROL FUM 100-5 MCG/ACT IN AERO
2.0000 | INHALATION_SPRAY | Freq: Two times a day (BID) | RESPIRATORY_TRACT | Status: DC
  Filled 2020-07-15: qty 8.8

## 2020-07-15 MED ORDER — BUPIVACAINE-EPINEPHRINE 0.5% -1:200000 IJ SOLN
INTRAMUSCULAR | Status: AC
  Filled 2020-07-15: qty 1

## 2020-07-15 MED ORDER — LACTATED RINGERS IV SOLN: INTRAVENOUS | Status: DC | PRN

## 2020-07-15 MED ORDER — ATORVASTATIN CALCIUM 10 MG PO TABS: 10.0000 mg | ORAL_TABLET | Freq: Every day | ORAL | Status: DC

## 2020-07-15 MED ORDER — PHENYLEPHRINE HCL (PRESSORS) 10 MG/ML IV SOLN
INTRAVENOUS | Status: DC | PRN
  Administered 2020-07-15: 200 ug via INTRAVENOUS
  Administered 2020-07-15: 160 ug via INTRAVENOUS
  Administered 2020-07-15: 100 ug via INTRAVENOUS

## 2020-07-15 MED ORDER — DEXAMETHASONE SODIUM PHOSPHATE 4 MG/ML IJ SOLN
INTRAMUSCULAR | Status: DC | PRN
  Administered 2020-07-15: 10 mg via INTRAVENOUS

## 2020-07-15 MED ORDER — ONDANSETRON HCL 4 MG/2ML IJ SOLN
INTRAMUSCULAR | Status: DC | PRN
  Administered 2020-07-15: 4 mg via INTRAVENOUS

## 2020-07-15 MED ORDER — THROMBIN 5000 UNITS EX SOLR
OROMUCOSAL | Status: DC | PRN
  Administered 2020-07-15: 08:00:00 5 mL via TOPICAL

## 2020-07-15 MED ORDER — SODIUM CHLORIDE 0.9% FLUSH
3.0000 mL | Freq: Two times a day (BID) | INTRAVENOUS | Status: DC
  Administered 2020-07-15: 12:00:00 3 mL via INTRAVENOUS

## 2020-07-15 SURGICAL SUPPLY — 49 items
BAND RUBBER #18 3X1/16 STRL (MISCELLANEOUS) IMPLANT
BENZOIN TINCTURE PRP APPL 2/3 (GAUZE/BANDAGES/DRESSINGS) ×3 IMPLANT
BLADE CLIPPER SURG (BLADE) IMPLANT
BUR MATCHSTICK NEURO 3.0 LAGG (BURR) ×3 IMPLANT
BUR PRECISION FLUTE 6.0 (BURR) ×3 IMPLANT
CANISTER SUCT 3000ML PPV (MISCELLANEOUS) ×3 IMPLANT
CARTRIDGE OIL MAESTRO DRILL (MISCELLANEOUS) ×1 IMPLANT
CLOSURE WOUND 1/2 X4 (GAUZE/BANDAGES/DRESSINGS) ×1
COVER WAND RF STERILE (DRAPES) ×3 IMPLANT
DIFFUSER DRILL AIR PNEUMATIC (MISCELLANEOUS) ×3 IMPLANT
DRAPE LAPAROTOMY 100X72X124 (DRAPES) ×3 IMPLANT
DRAPE MICROSCOPE LEICA (MISCELLANEOUS) IMPLANT
DRAPE SURG 17X23 STRL (DRAPES) ×12 IMPLANT
DRSG OPSITE POSTOP 4X6 (GAUZE/BANDAGES/DRESSINGS) ×3 IMPLANT
ELECT BLADE 4.0 EZ CLEAN MEGAD (MISCELLANEOUS) ×3
ELECT REM PT RETURN 9FT ADLT (ELECTROSURGICAL) ×3
ELECTRODE BLDE 4.0 EZ CLN MEGD (MISCELLANEOUS) ×1 IMPLANT
ELECTRODE REM PT RTRN 9FT ADLT (ELECTROSURGICAL) ×1 IMPLANT
GAUZE 4X4 16PLY RFD (DISPOSABLE) IMPLANT
GAUZE SPONGE 4X4 12PLY STRL (GAUZE/BANDAGES/DRESSINGS) ×3 IMPLANT
GLOVE BIO SURGEON STRL SZ8 (GLOVE) ×3 IMPLANT
GLOVE BIO SURGEON STRL SZ8.5 (GLOVE) ×3 IMPLANT
GLOVE BIOGEL PI IND STRL 7.5 (GLOVE) ×1 IMPLANT
GLOVE BIOGEL PI INDICATOR 7.5 (GLOVE) ×2
GLOVE EXAM NITRILE XL STR (GLOVE) IMPLANT
GLOVE SURG SS PI 7.5 STRL IVOR (GLOVE) ×9 IMPLANT
GLOVE SURG SS PI 8.0 STRL IVOR (GLOVE) ×3 IMPLANT
GOWN STRL REUS W/ TWL LRG LVL3 (GOWN DISPOSABLE) ×1 IMPLANT
GOWN STRL REUS W/ TWL XL LVL3 (GOWN DISPOSABLE) ×2 IMPLANT
GOWN STRL REUS W/TWL 2XL LVL3 (GOWN DISPOSABLE) IMPLANT
GOWN STRL REUS W/TWL LRG LVL3 (GOWN DISPOSABLE) ×3
GOWN STRL REUS W/TWL XL LVL3 (GOWN DISPOSABLE) ×6
KIT BASIN OR (CUSTOM PROCEDURE TRAY) ×3 IMPLANT
KIT TURNOVER KIT B (KITS) ×3 IMPLANT
NEEDLE HYPO 21X1.5 SAFETY (NEEDLE) IMPLANT
NEEDLE HYPO 22GX1.5 SAFETY (NEEDLE) ×3 IMPLANT
NS IRRIG 1000ML POUR BTL (IV SOLUTION) ×3 IMPLANT
OIL CARTRIDGE MAESTRO DRILL (MISCELLANEOUS) ×3
PACK LAMINECTOMY NEURO (CUSTOM PROCEDURE TRAY) ×3 IMPLANT
PAD ARMBOARD 7.5X6 YLW CONV (MISCELLANEOUS) IMPLANT
PATTIES SURGICAL .5 X1 (DISPOSABLE) IMPLANT
SPONGE SURGIFOAM ABS GEL SZ50 (HEMOSTASIS) IMPLANT
STRIP CLOSURE SKIN 1/2X4 (GAUZE/BANDAGES/DRESSINGS) ×2 IMPLANT
SUT VIC AB 1 CT1 18XBRD ANBCTR (SUTURE) ×1 IMPLANT
SUT VIC AB 1 CT1 8-18 (SUTURE) ×3
SUT VIC AB 2-0 CP2 18 (SUTURE) ×3 IMPLANT
TOWEL GREEN STERILE (TOWEL DISPOSABLE) ×3 IMPLANT
TOWEL GREEN STERILE FF (TOWEL DISPOSABLE) ×3 IMPLANT
WATER STERILE IRR 1000ML POUR (IV SOLUTION) ×3 IMPLANT

## 2020-07-15 NOTE — Op Note (Signed)
Brief history: The patient is a 69 year old white male on whom I performed a previous lumbar laminectomy years ago.  He did well until recently.  He has complained of back and leg pain consistent with neurogenic claudication.  He failed medical management and was worked up with a lumbar MRI.  This demonstrated lumbar spinal stenosis.  I discussed the various treatment options.  He has decided proceed with surgery after weighing the risk, benefits and alternatives.  Preoperative diagnosis: Lumbar spinal stenosis with neurogenic location, lumbago, lumbar radiculopathy  Postoperative diagnosis: The same  Procedure: Bilateral L2-3 laminectomy to decompress the bilateral L2 and L3 nerve roots  Surgeon: Dr. Delma Officer  Asst.: Hildred Priest, NP  Anesthesia: Gen. endotracheal  Estimated blood loss: 75 cc  Drains: None  Complications: None  Description of procedure: The patient was brought to the operating room by the anesthesia team. General endotracheal anesthesia was induced. The patient was turned to the prone position on the Wilson frame. The patient's lumbosacral region was then prepared with Betadine scrub and Betadine solution. Sterile drapes were applied.  I then injected the area to be incised with Marcaine with epinephrine solution. I then used a scalpel to make a linear midline incision over the L2-3 intervertebral disc space. I then used electrocautery to perform a bilateral subperiosteal dissection exposing the spinous process and lamina of L 2 and L3. We obtained intraoperative radiograph to confirm our location. I then inserted the [facility] retractor for exposure.  We began the decompression by incising the L1-2 interspinous ligament.  I used a Leksell rongeur to remove the spinous process of L2.  I used a high-speed drill to perform a bilateral L2-3 laminotomies . I then used a Kerrison punches to complete the L2 laminectomy bilaterally and to remove the ligamentum flavum at L1-2  and L2-3. We then used microdissection to free up the thecal sac and the bilateral L2 and L3 nerve root from the epidural tissue. I then used a Kerrison punch to perform a foraminotomy at about the bilateral L2 and L3 nerve root.  We inspected the intervertebral disc at L2-3 bilaterally.  It was bulging but not herniated.  We did not perform a discectomy.  I then palpated along the ventral surface of the thecal sac and along exit route of the L2 and L3 nerve root and noted that the neural structures were well decompressed. This completed the decompression.  We then obtained hemostasis using bipolar electrocautery. We irrigated the wound out with bacitracin solution. We then removed the retractor. We then reapproximated the patient's thoracolumbar fascia with interrupted #1 Vicryl suture. We then reapproximated the patient's subcutaneous tissue with interrupted 2-0 Vicryl suture. We then reapproximated patient's skin with Steri-Strips and benzoin. The was then coated with bacitracin ointment. The drapes were removed. The patient was subsequently returned to the supine position where they were extubated by the anesthesia team. The patient was then transported to the postanesthesia care unit in stable condition. All sponge instrument and needle counts were reportedly correct at the end of this case.

## 2020-07-15 NOTE — Discharge Summary (Signed)
Physician Discharge Summary     Providing Compassionate, Quality Care - Together   Patient ID: [name] MRN: [id number] DOB/AGE: [birth year] 69 y.o.  Admit date: 07/15/2020 Discharge date: 07/15/2020  Admission Diagnoses: Spinal stenosis of lumbar region with neurogenic claudication  Discharge Diagnoses:  Active Problems:   Spinal stenosis of lumbar region with neurogenic claudication   Discharged Condition: good  Hospital Course: Patient underwent bilateral L2-3 laminectomies by Dr. Lovell Sheehan on 07/15/2020. He was admitted to 3C06 for observation following recovery from anesthesia in the PACU. His postoperative course has been uncomplicated. He has worked with physical therapy who feels the patient is ready for discharge home. He is ambulating independently and without difficulty. He is tolerating a normal diet. He is not having any bowel or bladder dysfunction. His pain is well-controlled with oral pain medication. He is ready for discharge home.   Consults: None  Significant Diagnostic Studies: DG Lumbar Spine 1 View  Result Date: 07/15/2020 CLINICAL DATA:  Intraoperative localization for lumbar surgery EXAM: LUMBAR SPINE - 1 VIEW COMPARISON:  MRI lumbar spine dated 02/21/2020 FINDINGS: Single lateral view of the lumbar spine. Mild to moderate multilevel degenerative changes. A surgical probe is at the L2-3 level, posterior to the superior endplate of L3. IMPRESSION: Intraoperative localization as above. Electronically Signed   By: Charline Bills M.D.   On: 07/15/2020 11:03     Treatments: Surgery: Bilateral L2-3 laminectomies to decompress the bilateral L2 and L3 nerve roots  Discharge Exam: Blood pressure 136/80, pulse 73, temperature 98 F (36.7 C), temperature source Oral, resp. rate 18, height 5\' 7"  (1.702 m), weight 87.1 kg, SpO2 98 %.   Per report: Alert and oriented x 4 PERRLA CN II-XII grossly intact MAE, Strength and sensation intact Incision is  covered with Honeycomb dressing and Steri Strips; Dressing is clean, dry, and intact. There is a small amount of swelling at the patient's surgical site   Disposition: Discharge disposition: 01-Home or Self Care        Allergies as of 07/15/2020      Reactions   Chantix [varenicline] Other (See Comments)   headaches      Medication List    STOP taking these medications   naproxen sodium 220 MG tablet Commonly known as: ALEVE     TAKE these medications   Albuterol Sulfate 108 (90 Base) MCG/ACT Aepb Commonly known as: ProAir RespiClick Inhale 2 puffs into the lungs every 6 (six) hours as needed (cough, shortness of breath).   apixaban 5 MG Tabs tablet Commonly known as: ELIQUIS Take 1 tablet (5 mg total) by mouth 2 (two) times daily. Restart on 07/21/2019 What changed: additional instructions   atorvastatin 10 MG tablet Commonly known as: LIPITOR TAKE 1 TABLET(10 MG) BY MOUTH DAILY What changed: See the new instructions.   budesonide-formoterol 80-4.5 MCG/ACT inhaler Commonly known as: SYMBICORT Inhale 2 puffs into the lungs 2 (two) times daily as needed (shortness of breath).   cyclobenzaprine 10 MG tablet Commonly known as: FLEXERIL Take 1 tablet (10 mg total) by mouth 3 (three) times daily as needed for muscle spasms.   docusate sodium 100 MG capsule Commonly known as: COLACE Take 1 capsule (100 mg total) by mouth 2 (two) times daily.   lisinopril 10 MG tablet Commonly known as: ZESTRIL Take 1 tablet (10 mg total) by mouth daily.   metoprolol tartrate 50 MG tablet Commonly known as: LOPRESSOR Take 1 tablet (50 mg total) by mouth 2 (two) times daily.  Oxycodone HCl 10 MG Tabs Take 1 tablet (10 mg total) by mouth every 4 (four) hours as needed for severe pain ((score 7 to 10)).   tamsulosin 0.4 MG Caps capsule Commonly known as: FLOMAX Take 1 capsule (0.4 mg total) by mouth daily.       Follow-up Information    Tressie Stalker, MD. Schedule an  appointment as soon as possible for a visit in 2 week(s).   Specialty: Neurosurgery Contact information: [address] [place] [address] [phone]               Signed: Floreen Comber 07/15/2020, 2:30 PM

## 2020-07-15 NOTE — Anesthesia Procedure Notes (Signed)
Procedure Name: Intubation Date/Time: 07/15/2020 7:38 AM Performed by: Tillman Abide, CRNA Pre-anesthesia Checklist: Patient identified, Emergency Drugs available, Suction available and Patient being monitored Patient Re-evaluated:Patient Re-evaluated prior to induction Oxygen Delivery Method: Circle System Utilized Preoxygenation: Pre-oxygenation with 100% oxygen Induction Type: IV induction Ventilation: Mask ventilation without difficulty Laryngoscope Size: Glidescope and 3 Grade View: Grade II Tube type: Oral Number of attempts: 1 Airway Equipment and Method: Stylet Placement Confirmation: ETT inserted through vocal cords under direct vision,  positive ETCO2 and breath sounds checked- equal and bilateral Secured at: 22 cm Tube secured with: Tape Dental Injury: Teeth and Oropharynx as per pre-operative assessment

## 2020-07-15 NOTE — Discharge Instructions (Addendum)
Wound Care You can take a shower Keep incision covered and dry for 2-3 days.    Do not put any creams, lotions, or ointments on incision. Leave steri-strips on back.  They will fall off by themselves. Activity Walk each and every day, increasing distance each day. No lifting greater than 5 lbs. No driving for 2 weeks; may ride as a passenger locally. Diet Resume your normal diet.  Return to Work Will be discussed at your follow up appointment. Call Your Doctor If Any of These Occur Redness, drainage, or swelling at the wound.  Temperature greater than 101 degrees. Severe pain not relieved by pain medication. Incision starts to come apart. Follow Up Appt Call today for appointment in 1-2 weeks [phone]) or for problems.  If you have any hardware placed in your spine, you will need an x-ray before your appointment.

## 2020-07-15 NOTE — Evaluation (Signed)
Physical Therapy Evaluation and Discharge Patient Details Name: [name] MRN: [id number] DOB: 06-01-1951 Today's Date: 07/15/2020   History of Present Illness  Pt is a 69 y/o male admitted secondary to L2-3 laminectomy. PMH includes back surgery, ACDG, CAD, COPD, and HTN.  Clinical Impression  Patient evaluated by Physical Therapy with no further acute PT needs identified. All education has been completed and the patient has no further questions. Pt with increased pain in RLE, however, was able to tolerate gait and stair training. Required min guard to supervision for safety for all mobility. Pt reporting pain was worse than before and noted swelling at incision site; notified RN. Pt still wanting to go home today and has completed all necessary tasks. Reviewed back precautions and generalized walking program. See below for any follow-up Physical Therapy or equipment needs. PT is signing off. Thank you for this referral. If needs change, please re-consult.      Follow Up Recommendations No PT follow up    Equipment Recommendations  None recommended by PT    Recommendations for Other Services       Precautions / Restrictions Precautions Precautions: Back Precaution Booklet Issued: Yes (comment) Precaution Comments: Reviewed back precautions with pt. Restrictions Weight Bearing Restrictions: No      Mobility  Bed Mobility Overal bed mobility: Modified Independent                  Transfers Overall transfer level: Modified independent               General transfer comment: Increased time, but no physical assist required.  Ambulation/Gait Ambulation/Gait assistance: Supervision Gait Distance (Feet): 200 Feet Assistive device: None Gait Pattern/deviations: Step-through pattern;Decreased stride length;Decreased weight shift to right;Antalgic Gait velocity: Decreased   General Gait Details: Antalgic gait and required standing rest secondary to pain.  Supervision for safety and no physical assist required. Educated about walking program to perform at home  Stairs Stairs: Yes Stairs assistance: Min guard Stair Management: One rail Right;Step to pattern;Forwards Number of Stairs: 8 General stair comments: Overall steady stair navigation. Min guard for safety. Reporting increased pain.  Wheelchair Mobility    Modified Rankin (Stroke Patients Only)       Balance Overall balance assessment: No apparent balance deficits (not formally assessed)                                           Pertinent Vitals/Pain Pain Assessment: 0-10 Pain Score: 8  Pain Location: back and R leg from hip to top of knee Pain Descriptors / Indicators: Burning;Constant;Grimacing;Guarding Pain Intervention(s): Monitored during session;Limited activity within patient's tolerance;Repositioned    Home Living Family/patient expects to be discharged to:: Private residence Living Arrangements: Spouse/significant other;Children Available Help at Discharge: Family Type of Home: House Home Access: Stairs to enter Entrance Stairs-Rails: Right Entrance Stairs-Number of Steps: 3 Home Layout: One level Home Equipment: Cane - single point;Shower seat;Grab bars - tub/shower;Hand held shower head      Prior Function Level of Independence: Independent               Hand Dominance        Extremity/Trunk Assessment   Upper Extremity Assessment Upper Extremity Assessment: Overall WFL for tasks assessed    Lower Extremity Assessment Lower Extremity Assessment: RLE deficits/detail RLE Deficits / Details: Reports burning sensation that is worse than it was  from hip to top of knee.    Cervical / Trunk Assessment Cervical / Trunk Assessment: Other exceptions Cervical / Trunk Exceptions: s/p lumbar surgery. Noted swelling at incision site.  Communication   Communication: No difficulties  Cognition Arousal/Alertness:  Awake/alert Behavior During Therapy: WFL for tasks assessed/performed Overall Cognitive Status: Within Functional Limits for tasks assessed                                        General Comments      Exercises     Assessment/Plan    PT Assessment Patent does not need any further PT services  PT Problem List         PT Treatment Interventions      PT Goals (Current goals can be found in the Care Plan section)  Acute Rehab PT Goals Patient Stated Goal: to decrease pain PT Goal Formulation: With patient Time For Goal Achievement: 07/15/20 Potential to Achieve Goals: Good    Frequency     Barriers to discharge        Co-evaluation               AM-PAC PT "6 Clicks" Mobility  Outcome Measure Help needed turning from your back to your side while in a flat bed without using bedrails?: None Help needed moving from lying on your back to sitting on the side of a flat bed without using bedrails?: None Help needed moving to and from a bed to a chair (including a wheelchair)?: None Help needed standing up from a chair using your arms (e.g., wheelchair or bedside chair)?: None Help needed to walk in hospital room?: None Help needed climbing 3-5 steps with a railing? : A Little 6 Click Score: 23    End of Session   Activity Tolerance: Patient limited by pain Patient left: in bed;with call bell/phone within reach (sitting EOB) Nurse Communication: Mobility status;Other (comment) (pt with swelling at incision site) PT Visit Diagnosis: Other abnormalities of gait and mobility (R26.89);Pain Pain - Right/Left: Right Pain - part of body: Leg (back)    Time: 7782-4235 PT Time Calculation (min) (ACUTE ONLY): 18 min   Charges:   PT Evaluation $PT Eval Low Complexity: 1 Low          Cindee Salt, DPT  Acute Rehabilitation Services  Pager: [phone] Office: [phone]   Lehman Prom 07/15/2020, 12:49 PM

## 2020-07-15 NOTE — Transfer of Care (Signed)
Immediate Anesthesia Transfer of Care Note  Patient: AXLE PARFAIT  Procedure(s) Performed: LAMINECTOMY AND FORAMINOTOMY BILATERAL LUMBAR TWO-LUMBAR THREE (Bilateral )  Patient Location: PACU  Anesthesia Type:General  Level of Consciousness: awake, alert , oriented and patient cooperative  Airway & Oxygen Therapy: Patient Spontanous Breathing  Post-op Assessment: Report given to RN and Post -op Vital signs reviewed and stable  Post vital signs: Reviewed and stable  Last Vitals:  Vitals Value Taken Time  BP 91/72 07/15/20 0923  Temp    Pulse 112 07/15/20 0924  Resp 15 07/15/20 0924  SpO2 96 % 07/15/20 0924  Vitals shown include unvalidated device data.  Last Pain:  Vitals:   07/15/20 0609  TempSrc:   PainSc: 0-No pain         Complications: No complications documented.

## 2020-07-15 NOTE — H&P (Signed)
Subjective: The patient is a 69 year old white male on whom I previously performed back surgery and several neck surgeries.  He has developed recurrent back and right greater left leg pain consistent with neurogenic claudication.  He has failed medical management and was worked up with a lumbar MRI which demonstrated spinal stenosis at L2-3.  I discussed the various treatment options with him.  He has decided proceed with a lumbar laminectomy after weighing the risk, benefits and alternatives.  Past Medical History:  Diagnosis Date  . Allergic rhinitis 07/1998  . Aortic atherosclerosis (HCC) 09/2016   by CT  . Arthritis    BACK AND RIGHT KNEE  . CAD (coronary artery disease) 09/2016   RCA, LAD by CT  . COPD (chronic obstructive pulmonary disease) (HCC) 07/1998   centrilobular emphysema (09/2016) w ongoing tobacco use, spirometry (02/2013)  . Dysrhythmia    PVC'S  . ETOH abuse   . Headache    migraine- x1  . Hepatitis    pos test once not now,  Positive   . HTN (hypertension) 05/1998  . Hyperlipidemia 06/1996  . Lumbar spinal stenosis 03/2013   s/p laminectomy  . 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 Shortness of breath dyspnea    WITH EXERTION  . Smoker     Past Surgical History:  Procedure Laterality Date  . ANTERIOR CERVICAL DECOMP/DISCECTOMY FUSION  2003   C3-7  . BACK SURGERY  2007  . CARDIAC CATHETERIZATION N/A 07/01/2015   Procedure: Left Heart Cath and Coronary Angiography;  Surgeon: Iran Ouch, MD;  Location: [facility];  Service: Cardiovascular;  Laterality: N/A;  . CARDIOVERSION N/A 05/06/2020   Procedure: CARDIOVERSION;  Surgeon: Debbe Odea, MD;  Location: [facility] ORS;  Service: Cardiovascular;  Laterality: N/A;  . COLONOSCOPY  09/2010   HPx2, diverticula, hemorrhoids, rpt 10 yrs Elnoria Howard)  . ETT  2010   normal, ABIs normal  . JOINT REPLACEMENT     . KNEE ARTHROSCOPY Right 11   cartlage, ligaments  . KNEE ARTHROSCOPY Left 84  . LUMBAR LAMINECTOMY/DECOMPRESSION MICRODISCECTOMY Left 03/27/2013   Procedure: LUMBAR LAMINECTOMY/DECOMPRESSION MICRODISCECTOMY 2 LEVELS;  Surgeon: Cristi Loron, MD;  Location: [facility]URO OR[facility];  Service: Neurosurgery;  Laterality: Left;  LEFT Lumbar Three Four diskectomy with Lumbar Three-Four, Four-Five Laminectomy. L3/4 ahd L4/5 for LSS Lovell Sheehan)   . POSTERIOR CERVICAL FUSION/FORAMINOTOMY N/A 05/14/2020   Procedure: LAMINECTOMY, POSTERIOR INSTRUMENTATION AND FUSION, CERVICAL ONE- CERVICAL TWO, CERVICAL TWO- CERVICAL THREE;  Surgeon: Tressie Stalker, MD;  Location: [facility] OR;  Service: Neurosurgery;  Laterality: N/A;  . SHOULDER ARTHROSCOPY Right 11  . spriometry  02/2013   COPD with some restriction thought to be obesity related ([place])  . TOTAL KNEE ARTHROPLASTY Right 06/27/2014   Procedure: RIGHT TOTAL KNEE ARTHROPLASTY;  Surgeon: Kathryne Hitch, MD;  Location: [facility] ORS;  Service: Orthopedics;  Laterality: Right;  . US ECHOCARDIOGRAPHY  01/2013   WNL, mild diastolic dysfunction    Allergies  Allergen Reactions  . Chantix [Varenicline] Other (See Comments)    headaches    Social History   Tobacco Use  . Smoking status: Current Every Day Smoker    Packs/day: 0.50    Years: 40.00    Pack years: 20.00    Types: Cigarettes  . Smokeless tobacco: Never Used  Substance Use Topics  . Alcohol use: Yes    Alcohol/week:  28.0 standard drinks    Types: 28 Cans of beer per week    Comment: 3 beers daily    Family History  Problem Relation Age of Onset  . Coronary artery disease Mother   . Hypertension Mother   . Stroke Mother   . Coronary artery disease Father   . Hypertension Father   . Diabetes Father   . Stroke Father   . Pneumonia Sister   . Irregular heart beat Brother   . Maple syrup urine disease Brother   . Cancer Neg Hx    Prior to Admission medications   Medication Sig Start Date End  Date Taking? Authorizing Provider  Albuterol Sulfate (PROAIR RESPICLICK) 951 (90 Base) MCG/ACT AEPB Inhale 2 puffs into the lungs every 6 (six) hours as needed (cough, shortness of breath). 08/29/17  Yes Ria Bush, MD  atorvastatin (LIPITOR) 10 MG tablet TAKE 1 TABLET(10 MG) BY MOUTH DAILY Patient taking differently: Take 10 mg by mouth daily. 11/25/19  Yes Ria Bush, MD  budesonide-formoterol [facility]) 80-4.5 MCG/ACT inhaler Inhale 2 puffs into the lungs 2 (two) times daily as needed (shortness of breath). 09/11/17  Yes Ria Bush, MD  lisinopril (ZESTRIL) 10 MG tablet Take 1 tablet (10 mg total) by mouth daily. 06/30/20  Yes Vickie Epley, MD  metoprolol tartrate (LOPRESSOR) 50 MG tablet Take 1 tablet (50 mg total) by mouth 2 (two) times daily. 03/30/20  Yes Agbor-Etang, Aaron Edelman, MD  naproxen sodium (ALEVE) 220 MG tablet Take 440 mg by mouth daily as needed (pain).   Yes [provider]  tamsulosin (FLOMAX) 0.4 MG CAPS capsule Take 1 capsule (0.4 mg total) by mouth daily. 04/21/20  Yes Ria Bush, MD  apixaban (ELIQUIS) 5 MG TABS tablet Take 1 tablet (5 mg total) by mouth 2 (two) times daily. 03/30/20   Kate Sable, MD     Review of Systems  Positive ROS: As above  All other systems have been reviewed and were otherwise negative with the exception of those mentioned in the HPI and as above.  Objective: Vital signs in last 24 hours: Temp:  [98.2 F (36.8 C)] 98.2 F (36.8 C) (12/29 0552) Pulse Rate:  [85] 85 (12/29 0552) Resp:  [18] 18 (12/29 0552) BP: (135)/(83) 135/83 (12/29 0552) SpO2:  [99 %] 99 % (12/29 0552) Weight:  [87.1 kg] 87.1 kg (12/29 0552) Estimated body mass index is 30.07 kg/m as calculated from the following:   Height as of this encounter: 5\' 7"  (1.702 m).   Weight as of this encounter: 87.1 kg.   General Appearance: Alert Head: Normocephalic, without obvious abnormality, atraumatic Eyes: PERRL, conjunctiva/corneas  clear, EOM's intact,    Ears: Normal  Throat: Normal  Neck: His anterior posterior cervical incisions are well-healed.  He has a very limited cervical range of motion. Back: His lumbar incision is well-healed. Lungs: Clear to auscultation bilaterally, respirations unlabored Heart: Regular rate and rhythm, no murmur, rub or gallop Abdomen: Soft, non-tender Extremities: Extremities normal, atraumatic, no cyanosis or edema Skin: unremarkable  NEUROLOGIC:   Mental status: alert and oriented,Motor Exam - grossly normal Sensory Exam - grossly normal Reflexes:  Coordination - grossly normal Gait - grossly normal Balance - grossly normal Cranial Nerves: I: smell Not tested  II: visual acuity  OS: Normal  OD: Normal   II: visual fields Full to confrontation  II: pupils Equal, round, reactive to light  III,VII: ptosis None  III,IV,VI: extraocular muscles  Full ROM  V: mastication Normal  V:  facial light touch sensation  Normal  V,VII: corneal reflex  Present  VII: facial muscle function - upper  Normal  VII: facial muscle function - lower Normal  VIII: hearing Not tested  IX: soft palate elevation  Normal  IX,X: gag reflex Present  XI: trapezius strength  5/5  XI: sternocleidomastoid strength 5/5  XI: neck flexion strength  5/5  XII: tongue strength  Normal    Data Review Lab Results  Component Value Date   WBC 4.7 07/02/2020   HGB 13.1 07/02/2020   HCT 38.7 (L) 07/02/2020   MCV 99.9 07/02/2020   PLT 232.0 07/02/2020   Lab Results  Component Value Date   NA 135 07/02/2020   K 4.7 07/02/2020   CL 100 07/02/2020   CO2 27 07/02/2020   BUN 9 07/02/2020   CREATININE 0.91 07/02/2020   GLUCOSE 130 (H) 07/02/2020   Lab Results  Component Value Date   INR 0.94 06/29/2015    Assessment/Plan: Lumbar spinal stenosis, lumbago, lumbar radiculopathy, neurogenic claudication: I have discussed the situation with the patient.  I have reviewed his imaging studies with him and  pointed out the abnormalities.  We have discussed the various treatment options including surgery.  I have described the surgical treatment option of a L2-3 laminectomy/laminotomy/foraminotomies.  I have shown him surgical models.  We have discussed the risk, benefits, alternatives, expected postoperative course, and likelihood of achieving our goals with surgery.  I have answered all his questions.  He has decided to proceed with surgery.   Cristi Loron 07/15/2020 7:21 AM

## 2020-07-15 NOTE — Plan of Care (Signed)
Patient alert and oriented, voiding adequately, No c/o pain at this time. Incision slightly swollen, and MD made aware. Patient discharge home with stated understanding of instructions given. RN informed patient to monitor incision area and call MD office if swelling increases. Patient stated understanding. Aisha RN

## 2020-07-16 ENCOUNTER — Encounter (HOSPITAL_COMMUNITY): Payer: Self-pay | Admitting: Neurosurgery

## 2020-07-16 NOTE — Anesthesia Postprocedure Evaluation (Signed)
Anesthesia Post Note  Patient: [name]  Procedure(s) Performed: LAMINECTOMY AND FORAMINOTOMY BILATERAL LUMBAR TWO-LUMBAR THREE (Bilateral )     Patient location during evaluation: PACU Anesthesia Type: General Level of consciousness: awake and alert Pain management: pain level controlled Vital Signs Assessment: post-procedure vital signs reviewed and stable Respiratory status: spontaneous breathing, nonlabored ventilation, respiratory function stable and patient connected to nasal cannula oxygen Cardiovascular status: blood pressure returned to baseline and stable Postop Assessment: no apparent nausea or vomiting Anesthetic complications: no   No complications documented.  Last Vitals:  Vitals:   07/15/20 1032 07/15/20 1452  BP: 136/80 (!) 144/87  Pulse: 73 92  Resp: 18 18  Temp: 36.7 C 36.6 C  SpO2: 98% 98%    Last Pain:  Vitals:   07/15/20 1452  TempSrc: Oral  PainSc:                  Cecile Hearing

## 2020-07-28 ENCOUNTER — Ambulatory Visit: Payer: Self-pay | Admitting: Urology

## 2020-08-24 ENCOUNTER — Other Ambulatory Visit: Payer: Self-pay

## 2020-08-24 ENCOUNTER — Ambulatory Visit (INDEPENDENT_AMBULATORY_CARE_PROVIDER_SITE_OTHER): Payer: Medicare Other | Admitting: Cardiology

## 2020-08-24 ENCOUNTER — Encounter: Payer: Self-pay | Admitting: Cardiology

## 2020-08-24 VITALS — BP 136/86 | HR 110 | Ht 66.0 in | Wt 187.0 lb

## 2020-08-24 DIAGNOSIS — I2510 Atherosclerotic heart disease of native coronary artery without angina pectoris: Secondary | ICD-10-CM | POA: Diagnosis not present

## 2020-08-24 DIAGNOSIS — I4819 Other persistent atrial fibrillation: Secondary | ICD-10-CM

## 2020-08-24 DIAGNOSIS — I10 Essential (primary) hypertension: Secondary | ICD-10-CM | POA: Diagnosis not present

## 2020-08-24 MED ORDER — LISINOPRIL 5 MG PO TABS: 5.0000 mg | ORAL_TABLET | Freq: Every day | ORAL | 5 refills | Status: DC

## 2020-08-24 MED ORDER — METOPROLOL TARTRATE 100 MG PO TABS: 100.0000 mg | ORAL_TABLET | Freq: Two times a day (BID) | ORAL | 5 refills | Status: DC

## 2020-08-24 NOTE — Patient Instructions (Signed)
Medication Instructions:  Your physician has recommended you make the following change in your medication:   1.  INCREASE your Lopressor (Metoprolol Tartrate) to 100 MG: Take 1 tab by mouth twice a day. 2.  DECREASE your Lisinopril (Zestril) to 5 MG: Take 1 tab by mouth twice a day.  *If you need a refill on your cardiac medications before your next appointment, please call your pharmacy*   Lab Work: None ordered If you have labs (blood work) drawn today and your tests are completely normal, you will receive your results only by: [name] MyChart Message (if you have MyChart) OR . A paper copy in the mail If you have any lab test that is abnormal or we need to change your treatment, we will call you to review the results.   Testing/Procedures: None ordered   Follow-Up: At [facility], you and your health needs are our priority.  As part of our continuing mission to provide you with exceptional heart care, we have created designated Provider Care Teams.  These Care Teams include your primary Cardiologist (physician) and Advanced Practice Providers (APPs -  Physician Assistants and Nurse Practitioners) who all work together to provide you with the care you need, when you need it.  We recommend signing up for the patient portal called "MyChart".  Sign up information is provided on this After Visit Summary.  MyChart is used to connect with patients for Virtual Visits (Telemedicine).  Patients are able to view lab/test results, encounter notes, upcoming appointments, etc.  Non-urgent messages can be sent to your provider as well.   To learn more about what you can do with MyChart, go to [email or web address].    Your next appointment:   3 month(s)  The format for your next appointment:   In Person  Provider:   Kate Sable, MD   Other Instructions

## 2020-08-24 NOTE — Progress Notes (Signed)
Cardiology Office Note:    Date:  08/24/2020   ID:  [name], DOB 02/26/1951, MRN [id number]  PCP:  Pedro Bush, MD  [facility] Cardiologist:  Pedro Sable, MD  [place] Electrophysiologist:  None   Referring MD: Pedro Bush, MD   Chief Complaint  Patient presents with  . Other    3 month follow up. Meds reviewed verbally with patient.     History of Present Illness:    [name] is a 70 y.o. male with a hx of persistent A. fib on Eliquis, nonobstructive CAD (LHC 2016 LAD 30%, RCA 20%), hypertension, hyperlipidemia,  COPD, current smoker x40+ years who presents for follow-up.  DC cardioversion was attempted on 05/06/2020 unsuccessfully.    He presents for follow-up, denies any symptoms of shortness of breath, chest pain or palpitations.  Tolerating Lopressor 50 mg twice daily.  Blood pressures are well controlled at home.  Had appointment with EP, antiarrhythmics might be considered if patient becomes symptomatic or if decreases.  Has some issues cardiac insurance approving Eliquis.  Prior notes  patient with history of neck pains, back pains with radiculopathy.  Diagnosed with cervical spinal stenosis.  Laminectomy is being planned.  Saw primary care provider for preop exam, EKG noted to be in atrial fibrillation rapid ventricular response.  He was started on Eliquis and Lopressor.  He states his Eliquis was not approved by his insurance.  Echo 03/2020 showed low normal systolic function, EF 97%.  Past Medical History:  Diagnosis Date  . Allergic rhinitis 07/1998  . Aortic atherosclerosis ([place]) 09/2016   by CT  . Arthritis    BACK AND RIGHT KNEE  . CAD (coronary artery disease) 09/2016   RCA, LAD by CT  . COPD (chronic obstructive pulmonary disease) ([place]) 07/1998   centrilobular emphysema (09/2016) w ongoing tobacco use, spirometry (02/2013)  . Dysrhythmia    PVC'S  . ETOH abuse   . Headache    migraine- x1  . Hepatitis    pos test once  not now,  Positive   . HTN (hypertension) 05/1998  . Hyperlipidemia 06/1996  . Lumbar spinal stenosis 03/2013   s/p laminectomy  . 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 Shortness of breath dyspnea    WITH EXERTION  . Smoker     Past Surgical History:  Procedure Laterality Date  . ANTERIOR CERVICAL DECOMP/DISCECTOMY FUSION  2003   C3-7  . BACK SURGERY  2007  . CARDIAC CATHETERIZATION N/A 07/01/2015   Procedure: Left Heart Cath and Coronary Angiography;  Surgeon: Wellington Hampshire, MD;  Location: [place] [facility];  Service: Cardiovascular;  Laterality: N/A;  . CARDIOVERSION N/A 05/06/2020   Procedure: CARDIOVERSION;  Surgeon: Pedro Sable, MD;  Location: [facility];  Service: Cardiovascular;  Laterality: N/A;  . COLONOSCOPY  09/2010   HPx2, diverticula, hemorrhoids, rpt 10 yrs Benson [place])  . ETT  2010   normal, ABIs normal  . JOINT REPLACEMENT    . KNEE ARTHROSCOPY Right 11   cartlage, ligaments  . KNEE ARTHROSCOPY Left 84  . LUMBAR LAMINECTOMY  06/2020   Bilateral L2-3 laminectomy [name])  . LUMBAR LAMINECTOMY/DECOMPRESSION MICRODISCECTOMY Left 03/27/2013   Procedure: LUMBAR LAMINECTOMY/DECOMPRESSION MICRODISCECTOMY 2 LEVELS;  Surgeon: Ophelia Charter, MD;  Location: [place] [facility];  Service: Neurosurgery;  Laterality: Left;  LEFT Lumbar Three Four diskectomy with Lumbar Three-Four,  Four-Five Laminectomy. L3/4 ahd L4/5 for LSS [name])   . LUMBAR LAMINECTOMY/DECOMPRESSION MICRODISCECTOMY Bilateral 07/15/2020   Procedure: LAMINECTOMY AND FORAMINOTOMY BILATERAL LUMBAR TWO-LUMBAR THREE;  Surgeon: Newman Pies, MD;  Location: [place];  Service: Neurosurgery;  Laterality: Bilateral;  . POSTERIOR CERVICAL FUSION/FORAMINOTOMY N/A 05/14/2020   Procedure: LAMINECTOMY, POSTERIOR INSTRUMENTATION AND FUSION, CERVICAL ONE- CERVICAL TWO, CERVICAL TWO- CERVICAL THREE;  Surgeon: Newman Pies, MD;  Location: [place];  Service: Neurosurgery;  Laterality: N/A;  . SHOULDER ARTHROSCOPY Right 11  . spriometry  02/2013   COPD with some restriction thought to be obesity related ([place])  . TOTAL KNEE ARTHROPLASTY Right 06/27/2014   Procedure: RIGHT TOTAL KNEE ARTHROPLASTY;  Surgeon: Mcarthur Rossetti, MD;  [facility];  Service: Orthopedics;  Laterality: Right;  . US ECHOCARDIOGRAPHY  01/2013   WNL, mild diastolic dysfunction    Current Medications: Current Meds  Medication Sig  . Albuterol Sulfate (PROAIR RESPICLICK) 123XX123 (90 Base) MCG/ACT AEPB Inhale 2 puffs into the lungs every 6 (six) hours as needed (cough, shortness of breath).  [name] apixaban (ELIQUIS) 5 MG TABS tablet Take 1 tablet (5 mg total) by mouth 2 (two) times daily. Restart on 07/21/2019  . atorvastatin (LIPITOR) 10 MG tablet TAKE 1 TABLET(10 MG) BY MOUTH DAILY  . budesonide-formoterol (SYMBICORT) 80-4.5 MCG/ACT inhaler Inhale 2 puffs into the lungs 2 (two) times daily as needed (shortness of breath).  . tamsulosin (FLOMAX) 0.4 MG CAPS capsule Take 1 capsule (0.4 mg total) by mouth daily.  . [DISCONTINUED] lisinopril (ZESTRIL) 10 MG tablet Take 1 tablet (10 mg total) by mouth daily.  . [DISCONTINUED] metoprolol tartrate (LOPRESSOR) 50 MG tablet Take 1 tablet (50 mg total) by mouth 2 (two) times daily.     Allergies:   Chantix [varenicline]   Social History   Socioeconomic History  . Marital status: Married    Spouse name: Not on file  . Number of children: Not on file  . Years of education: Not on file  . Highest education level: Not on file  Occupational History  . Not on file  Tobacco Use  . Smoking status: Current Every Day Smoker    Packs/day: 0.50    Years: 40.00    Pack years: 20.00    Types: Cigarettes  . Smokeless tobacco: Never Used  Vaping Use  . Vaping Use: Never used  Substance and Sexual Activity  . Alcohol use: Yes    Alcohol/week: 28.0 standard drinks    Types: 28 Cans of beer  per week    Comment: 3 beers daily  . Drug use: No  . Sexual activity: Not Currently  Other Topics Concern  . Not on file  Social History Narrative   Lives with wife. Has 1 son, 2 daughters.    Occupation: Psychologist, clinical.    Activity: no exercise   Diet: good water, fruits/vegetables daily      Screened positive for OSA at recent hospitalization (03/2013)   Social Determinants of Health   Financial Resource Strain: Low Risk   . Difficulty of Paying Living Expenses: Not hard at all  Food Insecurity: No Food Insecurity  . Worried About Charity fundraiser in the Last Year: Never true  . Ran Out of Food in the Last Year: Never true  Transportation Needs: No Transportation Needs  . Lack of Transportation (Medical): No  . Lack of Transportation (Non-Medical): No  Physical Activity: Inactive  . Days of Exercise per Week: 0 days  . Minutes of  Exercise per Session: 0 min  Stress: No Stress Concern Present  . Feeling of Stress : Not at all  Social Connections: Not on file     Family History: The patient's family history includes Coronary artery disease in his father and mother; Diabetes in his father; Hypertension in his father and mother; Irregular heart beat in his brother; Maple syrup urine disease in his brother; Pneumonia in his sister; Stroke in his father and mother. There is no history of Cancer.  ROS:   Please see the history of present illness.     All other systems reviewed and are negative.  EKGs/Labs/Other Studies Reviewed:    The following studies were reviewed today:   EKG:  EKG is ordered today.  EKG showed atrial fibrillation, RVR, heart rate 110  Recent Labs: 03/27/2020: TSH 3.42 07/02/2020: ALT 17; BUN 9; Creatinine, Ser 0.91; Hemoglobin 13.1; Platelets 232.0; Potassium 4.7; Sodium 135  Recent Lipid Panel    Component Value Date/Time   CHOL 165 07/02/2020 0815   TRIG 50.0 07/02/2020 0815   HDL 89.00 07/02/2020 0815   CHOLHDL 2 07/02/2020 0815    VLDL 10.0 07/02/2020 0815   LDLCALC 66 07/02/2020 0815   LDLDIRECT 82.0 09/07/2016 0900    Physical Exam:    VS:  BP 136/86 (BP Location: Left Arm, Patient Position: Sitting, Cuff Size: Normal)   Pulse (!) 110   Ht 5\' 6"  (1.676 m)   Wt 187 lb (84.8 kg)   SpO2 97%   BMI 30.18 kg/m     Wt Readings from Last 3 Encounters:  08/24/20 187 lb (84.8 kg)  07/15/20 192 lb 0.3 oz (87.1 kg)  07/07/20 192 lb 1.6 oz (87.1 kg)     GEN:  Well nourished, well developed in no acute distress HEENT: Normal NECK: No JVD; No carotid bruits LYMPHATICS: No lymphadenopathy CARDIAC: Irregular irregular, no murmurs, rubs, gallops RESPIRATORY: Decreased breath sounds, no wheezing ABDOMEN: Soft, non-tender, non-distended MUSCULOSKELETAL:  No edema; No deformity  SKIN: Warm and dry NEUROLOGIC:  Alert and oriented x 3 PSYCHIATRIC:  Normal affect   ASSESSMENT:    1. Persistent atrial fibrillation ([place])   2. Coronary artery disease involving native coronary artery of native heart without angina pectoris   3. Essential hypertension    PLAN:    In order of problems listed above:  1. Patient with persistent atrial fibrillation.  CHA2DS2-VASc score 3(age, htn, vasc).  Echo with low normal EF of about 50%.  failed DCCV on 05/06/2020.  Heart rate elevated.  Increase Lopressor to 100 mg twice daily.  Continue Eliquis. 2. History of nonobstructive CAD.  Asymptomatic. Continue Eliquis, statin. 3. History of hypertension, BP controlled.  Lopressor as above.  Decrease lisinopril to 5 mg with increasing blood pressure.  If BP becomes elevated, can increase lisinopril dose.  Follow-up in 3 months   Medication Adjustments/Labs and Tests Ordered: Current medicines are reviewed at length with the patient today.  Concerns regarding medicines are outlined above.  Orders Placed This Encounter  Procedures  . EKG 12-Lead   Meds ordered this encounter  Medications  . metoprolol tartrate (LOPRESSOR) 100 MG tablet     Sig: Take 1 tablet (100 mg total) by mouth 2 (two) times daily.    Dispense:  60 tablet    Refill:  5  . lisinopril (ZESTRIL) 5 MG tablet    Sig: Take 1 tablet (5 mg total) by mouth daily.    Dispense:  30 tablet    Refill:  5    Patient Instructions  Medication Instructions:  Your physician has recommended you make the following change in your medication:   1.  INCREASE your Lopressor (Metoprolol Tartrate) to 100 MG: Take 1 tab by mouth twice a day. 2.  DECREASE your Lisinopril (Zestril) to 5 MG: Take 1 tab by mouth twice a day.  *If you need a refill on your cardiac medications before your next appointment, please call your pharmacy*   Lab Work: None ordered If you have labs (blood work) drawn today and your tests are completely normal, you will receive your results only by: [name] MyChart Message (if you have MyChart) OR . A paper copy in the mail If you have any lab test that is abnormal or we need to change your treatment, we will call you to review the results.   Testing/Procedures: None ordered   Follow-Up: At [facility], you and your health needs are our priority.  As part of our continuing mission to provide you with exceptional heart care, we have created designated Provider Care Teams.  These Care Teams include your primary Cardiologist (physician) and Advanced Practice Providers (APPs -  Physician Assistants and Nurse Practitioners) who all work together to provide you with the care you need, when you need it.  We recommend signing up for the patient portal called "MyChart".  Sign up information is provided on this After Visit Summary.  MyChart is used to connect with patients for Virtual Visits (Telemedicine).  Patients are able to view lab/test results, encounter notes, upcoming appointments, etc.  Non-urgent messages can be sent to your provider as well.   To learn more about what you can do with MyChart, go to [email or web address].    Your next appointment:    3 month(s)  The format for your next appointment:   In Person  Provider:   Kate Sable, MD   Other Instructions      Signed, Pedro Sable, MD  08/24/2020 1:01 PM    [place]

## 2020-08-27 ENCOUNTER — Ambulatory Visit (INDEPENDENT_AMBULATORY_CARE_PROVIDER_SITE_OTHER): Payer: Medicare Other | Admitting: Urology

## 2020-08-27 ENCOUNTER — Other Ambulatory Visit: Payer: Self-pay

## 2020-08-27 ENCOUNTER — Encounter: Payer: Self-pay | Admitting: Urology

## 2020-08-27 VITALS — BP 160/99 | HR 87 | Ht 66.0 in | Wt 187.0 lb

## 2020-08-27 DIAGNOSIS — Z87898 Personal history of other specified conditions: Secondary | ICD-10-CM

## 2020-08-27 DIAGNOSIS — I2510 Atherosclerotic heart disease of native coronary artery without angina pectoris: Secondary | ICD-10-CM

## 2020-08-27 DIAGNOSIS — N401 Enlarged prostate with lower urinary tract symptoms: Secondary | ICD-10-CM

## 2020-08-27 DIAGNOSIS — R339 Retention of urine, unspecified: Secondary | ICD-10-CM

## 2020-08-27 DIAGNOSIS — R3914 Feeling of incomplete bladder emptying: Secondary | ICD-10-CM | POA: Diagnosis not present

## 2020-08-27 LAB — BLADDER SCAN AMB NON-IMAGING: Scan Result: 340

## 2020-08-27 NOTE — Progress Notes (Signed)
08/27/2020 9:53 AM   [name] Nov 26, 1950 [id number]  Referring provider: Ria Bush, MD [address] [place],  [place] [address]  Chief Complaint  Patient presents with  . Urinary Retention    HPI: 70 year old male who presents today for further discussion of consideration of outlet procedure in the setting of BPH and incomplete bladder emptying.  He initially presented with UTI, obstructive voiding symptoms and history of retention.  He ultimately passed a voiding trial.  He has been on Flomax with improvement of his urinary symptoms since.  He continues to have significantly elevated PVRs in the 300s including today.  Subjectively, his symptoms are improving.  IPSS as below.  Since last visit, he has had a cervical spine fusion as well as an L2-L3 laminectomy only 6 weeks ago for which he still recovering.  He continues to be interested in outlet procedure given his concern for retrograde ejaculation associated with Flomax.  He is currently on Eliquis for A. fib.   Results for orders placed or performed in visit on 08/27/20  Bladder Scan (Post Void Residual) in office  Result Value Ref Range   Scan Result 340       IPSS    Row Name 08/27/20 0900         International Prostate Symptom Score   How often have you had the sensation of not emptying your bladder? Not at All     How often have you had to urinate less than every two hours? Not at All     How often have you found you stopped and started again several times when you urinated? Less than half the time     How often have you found it difficult to postpone urination? Not at All     How often have you had a weak urinary stream? Less than 1 in 5 times     How often have you had to strain to start urination? Not at All     How many times did you typically get up at night to urinate? 2 Times     Total IPSS Score 5           Quality of Life due to urinary symptoms   If you were to spend  the rest of your life with your urinary condition just the way it is now how would you feel about that? Mostly Satisfied            Score:  1-7 Mild 8-19 Moderate 20-35 Severe    PMH: Past Medical History:  Diagnosis Date  . Allergic rhinitis 07/1998  . Aortic atherosclerosis ([place]) 09/2016   by CT  . Arthritis    BACK AND RIGHT KNEE  . CAD (coronary artery disease) 09/2016   RCA, LAD by CT  . COPD (chronic obstructive pulmonary disease) ([place]) 07/1998   centrilobular emphysema (09/2016) w ongoing tobacco use, spirometry (02/2013)  . Dysrhythmia    PVC'S  . ETOH abuse   . Headache    migraine- x1  . Hepatitis    pos test once not now,  Positive   . HTN (hypertension) 05/1998  . Hyperlipidemia 06/1996  . Lumbar spinal stenosis 03/2013   s/p laminectomy  . 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 Shortness of breath dyspnea    WITH EXERTION  . Smoker  Surgical History: Past Surgical History:  Procedure Laterality Date  . ANTERIOR CERVICAL DECOMP/DISCECTOMY FUSION  2003   C3-7  . BACK SURGERY  2007  . CARDIAC CATHETERIZATION N/A 07/01/2015   Procedure: Left Heart Cath and Coronary Angiography;  Surgeon: Wellington Hampshire, MD;  Location: [place] [facility];  Service: Cardiovascular;  Laterality: N/A;  . CARDIOVERSION N/A 05/06/2020   Procedure: CARDIOVERSION;  Surgeon: Kate Sable, MD;  Location: [facility];  Service: Cardiovascular;  Laterality: N/A;  . COLONOSCOPY  09/2010   HPx2, diverticula, hemorrhoids, rpt 10 yrs Benson [place])  . ETT  2010   normal, ABIs normal  . JOINT REPLACEMENT    . KNEE ARTHROSCOPY Right 11   cartlage, ligaments  . KNEE ARTHROSCOPY Left 84  . LUMBAR LAMINECTOMY  06/2020   Bilateral L2-3 laminectomy [name])  . LUMBAR LAMINECTOMY/DECOMPRESSION MICRODISCECTOMY Left 03/27/2013   Procedure: LUMBAR LAMINECTOMY/DECOMPRESSION MICRODISCECTOMY 2  LEVELS;  Surgeon: Ophelia Charter, MD;  Location: [place] [facility];  Service: Neurosurgery;  Laterality: Left;  LEFT Lumbar Three Four diskectomy with Lumbar Three-Four, Four-Five Laminectomy. L3/4 ahd L4/5 for LSS [name])   . LUMBAR LAMINECTOMY/DECOMPRESSION MICRODISCECTOMY Bilateral 07/15/2020   Procedure: LAMINECTOMY AND FORAMINOTOMY BILATERAL LUMBAR TWO-LUMBAR THREE;  Surgeon: Newman Pies, MD;  Location: [facility];  Service: Neurosurgery;  Laterality: Bilateral;  . POSTERIOR CERVICAL FUSION/FORAMINOTOMY N/A 05/14/2020   Procedure: LAMINECTOMY, POSTERIOR INSTRUMENTATION AND FUSION, CERVICAL ONE- CERVICAL TWO, CERVICAL TWO- CERVICAL THREE;  Surgeon: Newman Pies, MD;  Location: [place];  Service: Neurosurgery;  Laterality: N/A;  . SHOULDER ARTHROSCOPY Right 11  . spriometry  02/2013   COPD with some restriction thought to be obesity related ([place])  . TOTAL KNEE ARTHROPLASTY Right 06/27/2014   Procedure: RIGHT TOTAL KNEE ARTHROPLASTY;  Surgeon: Mcarthur Rossetti, MD;  [facility];  Service: Orthopedics;  Laterality: Right;  . US ECHOCARDIOGRAPHY  01/2013   WNL, mild diastolic dysfunction    Home Medications:  Allergies as of 08/27/2020      Reactions   Chantix [varenicline] Other (See Comments)   headaches   Other Other (See Comments)      Medication List       Accurate as of August 27, 2020  9:53 AM. If you have any questions, ask your nurse or doctor.        Albuterol Sulfate 108 (90 Base) MCG/ACT Aepb Commonly known as: ProAir RespiClick Inhale 2 puffs into the lungs every 6 (six) hours as needed (cough, shortness of breath).   apixaban 5 MG Tabs tablet Commonly known as: ELIQUIS Take 1 tablet (5 mg total) by mouth 2 (two) times daily. Restart on 07/21/2019   aspirin 81 MG EC tablet Take 1 tablet by mouth daily.   atorvastatin 10 MG tablet Commonly known as: LIPITOR TAKE 1 TABLET(10 MG) BY MOUTH DAILY   budesonide-formoterol 80-4.5 MCG/ACT  inhaler Commonly known as: SYMBICORT Inhale 2 puffs into the lungs 2 (two) times daily as needed (shortness of breath).   Cholecalciferol 25 MCG (1000 UT) tablet Take 1 tablet by mouth daily.   Fluticasone-Salmeterol 100-50 MCG/DOSE Aepb Commonly known as: ADVAIR INHALE 1 INHALATION TWICE A DAY (RINSE MOUTH WELL WITH WATER AFTER EACH USE)   lisinopril 5 MG tablet Commonly known as: ZESTRIL Take 1 tablet (5 mg total) by mouth daily.   metoprolol tartrate 100 MG tablet Commonly known as: LOPRESSOR Take 1 tablet (100 mg total) by mouth 2 (two) times daily.   tamsulosin 0.4 MG Caps capsule Commonly known as: FLOMAX Take  1 capsule (0.4 mg total) by mouth daily.   tiotropium 18 MCG inhalation capsule Commonly known as: SPIRIVA INHALE 1 CAPSULE IN INHALER BY MOUTH DAILY   tiZANidine 4 MG tablet Commonly known as: ZANAFLEX Take 1 tablet by mouth 2 (two) times daily as needed.       Allergies:  Allergies  Allergen Reactions  . Chantix [Varenicline] Other (See Comments)    headaches  . Other Other (See Comments)    Family History: Family History  Problem Relation Age of Onset  . Coronary artery disease Mother   . Hypertension Mother   . Stroke Mother   . Coronary artery disease Father   . Hypertension Father   . Diabetes Father   . Stroke Father   . Pneumonia Sister   . Irregular heart beat Brother   . Maple syrup urine disease Brother   . Cancer Neg Hx     Social History:  reports that he has been smoking cigarettes. He has a 20.00 pack-year smoking history. He has never used smokeless tobacco. He reports current alcohol use of about 28.0 standard drinks of alcohol per week. He reports that he does not use drugs.   Physical Exam: BP (!) 160/99   Pulse 87   Ht 5\' 6"  (1.676 m)   Wt 187 lb (84.8 kg)   BMI 30.18 kg/m   Constitutional:  Alert and oriented, No acute distress. HEENT: [place] AT, moist mucus membranes.  Trachea midline, no masses. Cardiovascular: No  clubbing, cyanosis, or edema. Respiratory: Normal respiratory effort, no increased work of breathing. Skin: No rashes, bruises or suspicious lesions. Neurologic: Grossly intact, no focal deficits, moving all 4 extremities. Psychiatric: Normal mood and affect.  Laboratory Data: Lab Results  Component Value Date   WBC 4.7 07/02/2020   HGB 13.1 07/02/2020   HCT 38.7 (L) 07/02/2020   MCV 99.9 07/02/2020   PLT 232.0 07/02/2020    Lab Results  Component Value Date   CREATININE 0.91 07/02/2020    Lab Results  Component Value Date   PSA 1.12 07/02/2020   PSA 2.260 03/17/2020   PSA 1.81 09/30/2019     Assessment & Plan:    1. Benign prostatic hyperplasia with incomplete bladder emptying Obstructive urinary symptoms with history of retention and urinary tract infections now symptomatically improved on Flomax however continues to have a markedly elevated PVR  We discussed that it may be a neurological component and especially given his spinal issues however currently also has a component of obstruction  Based on previous conversation, he is interested in outlet procedure.  Due to his concern for sexual side effects he is most interested in UroLift.  He is an excellent candidate for this based on previous evaluation of his anatomy.  We discussed the procedure itself at length.  We discussed possible complications including risk of pain, infection, damage surrounding structures, clip complications, migration, and procedure failure amongst others.  All questions were answered.  He like to defer this until about April to give more time to recover from his back surgery.  We will also need to have cardiac clearance to hold his Eliquis several days prior to the procedure.  He is agreeable this plan.  2. History of urinary retention As above - Bladder Scan (Post Void Residual) in office    Hollice Espy, MD  [place] [address] [place], [place] [address] [phone]

## 2020-09-01 ENCOUNTER — Telehealth: Payer: Self-pay

## 2020-09-01 MED ORDER — TAMSULOSIN HCL 0.4 MG PO CAPS: 0.4000 mg | ORAL_CAPSULE | Freq: Every day | ORAL | 3 refills | Status: DC

## 2020-09-01 NOTE — Telephone Encounter (Signed)
Yes, ok to send.  Previously prescribed by PCP but we can take over.    Hollice Espy, MD

## 2020-09-01 NOTE — Telephone Encounter (Signed)
Patient informed, sent in RX-voiced understanding.

## 2020-09-01 NOTE — Telephone Encounter (Signed)
Pt LM on triage line stating that he was seen on 2/10 and was expecting a rx for Flomax to be sent into [facility]. He questions if he should continue medication, if so needs rx. Please advise.

## 2020-09-04 ENCOUNTER — Other Ambulatory Visit: Payer: Self-pay

## 2020-09-04 DIAGNOSIS — R3914 Feeling of incomplete bladder emptying: Secondary | ICD-10-CM

## 2020-09-04 DIAGNOSIS — N401 Enlarged prostate with lower urinary tract symptoms: Secondary | ICD-10-CM

## 2020-09-04 DIAGNOSIS — R339 Retention of urine, unspecified: Secondary | ICD-10-CM

## 2020-09-04 MED ORDER — TAMSULOSIN HCL 0.4 MG PO CAPS: 0.4000 mg | ORAL_CAPSULE | Freq: Every day | ORAL | 3 refills | Status: DC

## 2020-09-04 NOTE — Progress Notes (Signed)
Incoming call on triage line from patient in regards to tamsulosin Rx. Patient states he thought it was sent in on 2/15 however his pharmacy never received Rx. After investigation, Rx was printed instead e-prescribed. New Rx sent to pharmacy.

## 2020-09-14 ENCOUNTER — Telehealth: Payer: Self-pay | Admitting: Cardiology

## 2020-09-14 NOTE — Telephone Encounter (Signed)
   [facility] Pre-operative Risk Assessment    HEARTCARE STAFF: - Please ensure there is not already an duplicate clearance open for this procedure. - Under Visit Info/Reason for Call, type in Other and utilize the format Clearance MM/DD/YY or Clearance TBD. Do not use dashes or single digits. - If request is for dental extraction, please clarify the # of teeth to be extracted.  Request for surgical clearance:  1. What type of surgery is being performed? Cystoscopy w/ insertion of urolift  2. When is this surgery scheduled? 11/02/20  3. What type of clearance is required (medical clearance vs. Pharmacy clearance to hold med vs. Both)? Both (hx of a fib, cad and HTN)  4. Are there any medications that need to be held prior to surgery and how long? Hold eliquis 3 days prior. May continue asa 56m  5. Practice name and name of physician performing surgery? [facility] AHollice Espy 6. What is the office phone number? [phone]  7.   What is the office fax number? [phone] 8.   Anesthesia type (None, local, MAC, general) ? Not noted   TMarykay Lex2/28/2022, 3:32 PM  _________________________________________________________________   (provider comments below)

## 2020-09-15 ENCOUNTER — Telehealth: Payer: Self-pay | Admitting: Radiology

## 2020-09-15 ENCOUNTER — Other Ambulatory Visit: Payer: Self-pay | Admitting: Radiology

## 2020-09-15 DIAGNOSIS — N401 Enlarged prostate with lower urinary tract symptoms: Secondary | ICD-10-CM

## 2020-09-15 DIAGNOSIS — R3914 Feeling of incomplete bladder emptying: Secondary | ICD-10-CM

## 2020-09-15 NOTE — Telephone Encounter (Signed)
Left message for pt to call back. Richardson Dopp, PA-C    09/15/2020 4:54 PM

## 2020-09-15 NOTE — Telephone Encounter (Signed)
70 yo male with  - Persistent AFib, failed DCCV in the past - CAD, non-obstructive - Mild reduction in EF, likely due to AFib  - HTN  - HLD - COPD, tobacco use  - PVCs  - 07/02/2020: Creatinine, Ser 0.91    Echo 03/31/2020: EF 45-50, no significant valve disease Cath 06/2015: LAD 30, RCA 20  Last OV 08/24/20 with Dr. Garen Lah.  Will fwd to PharmD for rec's re: anticoagulation management.  Then will need to call pt. Richardson Dopp, PA-C    09/15/2020 2:19 PM

## 2020-09-15 NOTE — Telephone Encounter (Signed)
LMOM to return call. Need to discuss Urolift with Dr [name].

## 2020-09-15 NOTE — Telephone Encounter (Signed)
Patient with diagnosis of afib on Eliquis for anticoagulation.    Procedure: Cystoscopy w/ insertion of urolift Date of procedure: 11/02/20  CHA2DS2-VASc Score = 4  This indicates a 4.8% annual risk of stroke. The patient's score is based upon: CHF History: Yes HTN History: Yes Diabetes History: No Stroke History: No Vascular Disease History: Yes Age Score: 1 Gender Score: 0     CrCl 78 ml/min  Per office protocol, patient can hold Eliquis for 3 days prior to procedure.

## 2020-09-16 NOTE — Telephone Encounter (Signed)
Discussed instructions and appointments related to surgery. Questions answered. Patient expresses understanding.

## 2020-09-16 NOTE — Telephone Encounter (Signed)
Patient returning call.

## 2020-09-16 NOTE — Telephone Encounter (Signed)
   Primary Cardiologist: Kate Sable, MD  Chart reviewed as part of pre-operative protocol coverage. Given past medical history and time since last visit, based on ACC/AHA guidelines, [name] would be at acceptable risk for the planned procedure without further cardiovascular testing.   Spoke with patient today and he reports he is doing very well from a CV standpoint with no c/o chest pain or palpitations. He is able to perform greater than 4 METS without complication.   Per pharmacy:  Patient with diagnosis of afib on Eliquis for anticoagulation.    Procedure: Cystoscopy w/ insertion of urolift Date of procedure: 11/02/20  CHA2DS2-VASc Score = 4  This indicates a 4.8% annual risk of stroke. The patient's score is based upon: CHF History: Yes HTN History: Yes Diabetes History: No Stroke History: No Vascular Disease History: Yes Age Score: 1 Gender Score: 0   CrCl 78 ml/min  Per office protocol, patient can hold Eliquis for 3 days prior to procedure.   The patient was advised that if he develops new symptoms prior to surgery to contact our office to arrange for a follow-up visit, and he verbalized understanding.  I will route this recommendation to the requesting party via Epic fax function and remove from pre-op pool.  Please call with questions.  Kathyrn Drown, NP 09/16/2020, 1:48 PM

## 2020-10-15 ENCOUNTER — Telehealth: Payer: Self-pay

## 2020-10-15 NOTE — Telephone Encounter (Signed)
Called [facility] to inquire about status. Was told patient has been approved from 09/23/20-07/17/21, he received his first shipment 09/25/20.

## 2020-10-16 ENCOUNTER — Other Ambulatory Visit: Payer: Self-pay

## 2020-10-16 DIAGNOSIS — R3914 Feeling of incomplete bladder emptying: Secondary | ICD-10-CM

## 2020-10-16 DIAGNOSIS — N401 Enlarged prostate with lower urinary tract symptoms: Secondary | ICD-10-CM

## 2020-10-22 ENCOUNTER — Other Ambulatory Visit: Payer: Medicare Other

## 2020-10-22 ENCOUNTER — Other Ambulatory Visit: Payer: Self-pay

## 2020-10-22 DIAGNOSIS — N401 Enlarged prostate with lower urinary tract symptoms: Secondary | ICD-10-CM | POA: Diagnosis not present

## 2020-10-22 DIAGNOSIS — R3914 Feeling of incomplete bladder emptying: Secondary | ICD-10-CM | POA: Diagnosis not present

## 2020-10-22 LAB — MICROSCOPIC EXAMINATION
Bacteria, UA: NONE SEEN
RBC, Urine: NONE SEEN /hpf (ref 0–2)

## 2020-10-22 LAB — URINALYSIS, COMPLETE
Bilirubin, UA: NEGATIVE
Glucose, UA: NEGATIVE
Ketones, UA: NEGATIVE
Leukocytes,UA: NEGATIVE
Nitrite, UA: NEGATIVE
Protein,UA: NEGATIVE
RBC, UA: NEGATIVE
Specific Gravity, UA: 1.015 (ref 1.005–1.030)
Urobilinogen, Ur: 0.2 mg/dL (ref 0.2–1.0)
pH, UA: 7 (ref 5.0–7.5)

## 2020-10-23 ENCOUNTER — Other Ambulatory Visit: Payer: Medicare Other

## 2020-10-26 ENCOUNTER — Other Ambulatory Visit: Payer: Self-pay

## 2020-10-26 ENCOUNTER — Other Ambulatory Visit
Admission: RE | Admit: 2020-10-26 | Discharge: 2020-10-26 | Disposition: A | Discharge status: Home / Self Care | Payer: Medicare Other | Source: Ambulatory Visit | Attending: Urology | Admitting: Urology

## 2020-10-26 DIAGNOSIS — Z01818 Encounter for other preprocedural examination: Secondary | ICD-10-CM | POA: Insufficient documentation

## 2020-10-26 DIAGNOSIS — I10 Essential (primary) hypertension: Secondary | ICD-10-CM | POA: Insufficient documentation

## 2020-10-26 DIAGNOSIS — Z01810 Encounter for preprocedural cardiovascular examination: Secondary | ICD-10-CM | POA: Diagnosis not present

## 2020-10-26 LAB — BASIC METABOLIC PANEL
Anion gap: 9 (ref 5–15)
BUN: 11 mg/dL (ref 8–23)
CO2: 27 mmol/L (ref 22–32)
Calcium: 9.2 mg/dL (ref 8.9–10.3)
Chloride: 100 mmol/L (ref 98–111)
Creatinine, Ser: 0.72 mg/dL (ref 0.61–1.24)
GFR, Estimated: >60 mL/min (ref >60)
Glucose, Bld: 110 mg/dL — ABNORMAL HIGH (ref 70–99)
Potassium: 4.2 mmol/L (ref 3.5–5.1)
Sodium: 136 mmol/L (ref 135–145)

## 2020-10-26 LAB — CBC
HCT: 37.2 % — ABNORMAL LOW (ref 39.0–52.0)
Hemoglobin: 13 g/dL (ref 13.0–17.0)
MCH: 33.5 pg (ref 26.0–34.0)
MCHC: 34.9 g/dL (ref 30.0–36.0)
MCV: 95.9 fL (ref 80.0–100.0)
Platelets: 222 10*3/uL (ref 150–400)
RBC: 3.88 MIL/uL — ABNORMAL LOW (ref 4.22–5.81)
RDW: 13.2 % (ref 11.5–15.5)
WBC: 5.1 10*3/uL (ref 4.0–10.5)
nRBC: 0 % (ref 0.0–0.2)

## 2020-10-26 LAB — CULTURE, URINE COMPREHENSIVE

## 2020-10-26 NOTE — Patient Instructions (Addendum)
Your procedure is scheduled on: 11/02/20 - Monday Report to the Registration Desk on the 1st floor of the [facility]. To find out your arrival time, please call [phone] between 1PM - 3PM on: 10/30/20 - Friday  REMEMBER: Instructions that are not followed completely may result in serious medical risk, up to and including death; or upon the discretion of your surgeon and anesthesiologist your surgery may need to be rescheduled.  Do not eat food or drink any fluids after midnight the night before surgery.  No gum chewing, lozengers or hard candies.  TAKE THESE MEDICATIONS THE MORNING OF SURGERY WITH A SIP OF WATER:  - Fluticasone-Salmeterol (ADVAIR) 100-50 MCG/DOSE AEPB - metoprolol tartrate (LOPRESSOR) 100 MG tablet - loratadine (CLARITIN) 10 MG tablet - budesonide-formoterol (SYMBICORT) 80-4.5 MCG/ACT inhaler - atorvastatin (LIPITOR) 10 MG tablet - tamsulosin (FLOMAX) 0.4 MG CAPS capsule  Use inhaler Albuterol Sulfate (PROAIR RESPICLICK) 093 (90 Base) MCG/ACT AEPB on the day of surgery and bring to the hospital.  Follow recommendations from Cardiologist, Pulmonologist or PCP regarding stopping Aspirin, Coumadin, Plavix, Eliquis, Pradaxa, or Pletal :apixaban (ELIQUIS) 5 MG TABS tablet stop taking beginning 10/30/20.  One week prior to surgery: Stop Anti-inflammatories (NSAIDS) such as Advil, Aleve, Ibuprofen, Motrin, Naproxen, Naprosyn and Aspirin based products such as Excedrin, Goodys Powder, BC Powder.  Stop ANY OVER THE COUNTER supplements until after surgery. Stop taking beginning  10/25/20 - sulfurzyme and Young Living Essential Oils  750 mg each.  No Alcohol for 24 hours before or after surgery.  No Smoking including e-cigarettes for 24 hours prior to surgery.  No chewable tobacco products for at least 6 hours prior to surgery.  No nicotine patches on the day of surgery.  Do not use any "recreational" drugs for at least a week prior to your surgery.  Please be  advised that the combination of cocaine and anesthesia may have negative outcomes, up to and including death. If you test positive for cocaine, your surgery will be cancelled.  On the morning of surgery brush your teeth with toothpaste and water, you may rinse your mouth with mouthwash if you wish. Do not swallow any toothpaste or mouthwash.  Do not wear jewelry, make-up, hairpins, clips or nail polish.  Do not wear lotions, powders, or perfumes.   Do not shave body from the neck down 48 hours prior to surgery just in case you cut yourself which could leave a site for infection.  Also, freshly shaved skin may become irritated if using the CHG soap.  Contact lenses, hearing aids and dentures may not be worn into surgery.  Do not bring valuables to the hospital. [facility] is not responsible for any missing/lost belongings or valuables.   Use CHG Soap or wipes as directed on instruction sheet.  Notify your doctor if there is any change in your medical condition (cold, fever, infection).  Wear comfortable clothing (specific to your surgery type) to the hospital.  Plan for stool softeners for home use; pain medications have a tendency to cause constipation. You can also help prevent constipation by eating foods high in fiber such as fruits and vegetables and drinking plenty of fluids as your diet allows.  After surgery, you can help prevent lung complications by doing breathing exercises.  Take deep breaths and cough every 1-2 hours. Your doctor may order a device called an Incentive Spirometer to help you take deep breaths. When coughing or sneezing, hold a pillow firmly against your incision with both hands. This  is called "splinting." Doing this helps protect your incision. It also decreases belly discomfort.  If you are being admitted to the hospital overnight, leave your suitcase in the car. After surgery it may be brought to your room.  If you are being discharged the day of  surgery, you will not be allowed to drive home. You will need a responsible adult (18 years or older) to drive you home and stay with you that night.   If you are taking public transportation, you will need to have a responsible adult (18 years or older) with you. Please confirm with your physician that it is acceptable to use public transportation.   Please call the [place] Dept. at [phone] if you have any questions about these instructions.  Surgery Visitation Policy:  Patients undergoing a surgery or procedure may have one family member or support person with them as long as that person is not COVID-19 positive or experiencing its symptoms.  That person may remain in the waiting area during the procedure.  Inpatient Visitation:    Visiting hours are 7 a.m. to 8 p.m. Inpatients will be allowed two visitors daily. The visitors may change each day during the patient's stay. No visitors under the age of 79. Any visitor under the age of 26 must be accompanied by an adult. The visitor must pass COVID-19 screenings, use hand sanitizer when entering and exiting the patient's room and wear a mask at all times, including in the patient's room. Patients must also wear a mask when staff or their visitor are in the room. Masking is required regardless of vaccination status.

## 2020-10-28 ENCOUNTER — Encounter: Payer: Self-pay | Admitting: Urology

## 2020-10-28 NOTE — Progress Notes (Signed)
Perioperative Services  Pre-Admission/Anesthesia Testing Clinical Review  Date: 10/28/20  Patient Demographics:  Name: [name] DOB:   01-29-1951 MRN:   [id number]  Planned Surgical Procedure(s):    Case: [id number] Date/Time: 11/02/20 0730   Procedure: CYSTOSCOPY WITH INSERTION OF UROLIFT (N/A )   Anesthesia type: General   Pre-op diagnosis: benign prostatic hypertrophy with incomplete bladder emptying   Location: [facility] OR ROOM 10 / [place] ORS FOR ANESTHESIA GROUP   Surgeons: Hollice Espy, MD    NOTE: Available PAT nursing documentation and vital signs have been reviewed. Clinical nursing staff has updated patient's PMH/PSHx, current medication list, and drug allergies/intolerances to ensure comprehensive history available to assist in medical decision making as it pertains to the aforementioned surgical procedure and anticipated anesthetic course.   Clinical Discussion:  [name] is a 70 y.o. male who is submitted for pre-surgical anesthesia review and clearance prior to him undergoing the above procedure. Patient is a Current Smoker (20 pack years). Pertinent PMH includes: CAD, persistent atrial fibrillation, aortic atherosclerosis, HTN, HLD, HCV, COPD, DOE, OA, lumbar spinal stenosis.  Patient is followed by cardiology Garen Lah, MD). He was last seen in the cardiology clinic on 08/24/2020; notes reviewed.  At the time of his clinic visit, patient reported to be doing well from a cardiovascular perspective.  He denied any chest pain, shortness of breath, PND, orthopnea, palpitations, significant peripheral edema, vertiginous symptoms, or presyncope/syncope.  PMH (+) for CAD.  Diagnostic left heart catheterization in 2016 revealed nonobstructive disease; 20% stenosis RCA and 30% stenosis of the LAD.  History also significant for persistent atrial fibrillation.  Patient underwent DCCV procedure in 04/2020 that was unsuccessful.  Patient has been seen in consult by  electrophysiology and was advised that antiarrhythmics would be considered if patient became symptomatic. CHA2DS2-VASc Score = 4 (age, CHF, HTN, vascular disease). Patient chronically anticoagulated using apixaban; compliant with therapy with no evidence of GI bleeding. Last TTE performed on 03/31/2020 revealed mildly reduced left ventricular systolic function with an EF of 45-50% (see full interpretation of cardiovascular testing below). Patient on GDMT for his HTN and HLD diagnoses.  Blood pressure reasonably controlled at 136/86 on currently prescribed ACEi and beta blocker therapies. Heart rate elevated in clinic at 110 bpm. Metoprolol dose was increased to 100 mg BID. Patient is on a statin for his HLD. Functional capacity, as defined by DASI, is documented as being >/= 4 METS. No other changes were made to patient's medication regimen. Patient to follow-up with outpatient cardiology in 3 months or sooner if needed.  Patient is scheduled to undergo urological procedure on 11/03/2018 with Dr. Hollice Espy.  Given patient's past medical history significant for cardiovascular disease and intervention, presurgical cardiac clearance was sought by performing surgeon's office and PAT team. Per cardiology, "based on patient's past medical history and time since his last clinic visit, patient would be at an overall ACCEPTABLE risk for the planned procedure without further cardiovascular testing or intervention at this time".  Again, this patient is on daily anticoagulation therapy.  He has been instructed on recommendations from cardiology for holding his apixaban for 3 days prior to his procedure with plans to restart as soon as postoperative bleeding risk can be minimized by primary attending surgeon.  Patient is last dose of apixaban will be on 10/30/2020.  Patient denies previous perioperative complications with anesthesia in the past. In review of the available records, it is noted that patient underwent a  general anesthetic course at [facility] in 06/2020 without documented complications.   Vitals with BMI 10/26/2020 08/27/2020 08/24/2020  Height - 5\' 6"  5\' 6"   Weight 187 lbs 3 oz 187 lbs 187 lbs  BMI - 44.8 18.5  Systolic 631 497 026  Diastolic 97 99 86  Pulse 86 87 110    Providers/Specialists:   NOTE: Primary physician provider listed below. Patient may have been seen by APP or partner within same practice.   PROVIDER ROLE / SPECIALTY LAST Lu Duffel, MD Urology (Surgeon)  08/27/2020  Ria Bush, MD Primary Care Provider  07/06/2020  Kate Sable, MD Cardiology  08/24/2020   Allergies:  Chantix [varenicline] and Other  Current Home Medications:   No current facility-administered medications for this encounter.   . Albuterol Sulfate (PROAIR RESPICLICK) 378 (90 Base) MCG/ACT AEPB  . apixaban (ELIQUIS) 5 MG TABS tablet  . atorvastatin (LIPITOR) 10 MG tablet  . budesonide-formoterol (SYMBICORT) 80-4.5 MCG/ACT inhaler  . Cholecalciferol 25 MCG (1000 UT) tablet  . Fluticasone-Salmeterol (ADVAIR) 100-50 MCG/DOSE AEPB  . lisinopril (ZESTRIL) 5 MG tablet  . loratadine (CLARITIN) 10 MG tablet  . metoprolol tartrate (LOPRESSOR) 100 MG tablet  . OVER THE COUNTER MEDICATION  . OVER THE COUNTER MEDICATION  . tamsulosin (FLOMAX) 0.4 MG CAPS capsule  . tiotropium (SPIRIVA) 18 MCG inhalation capsule   History:   Past Medical History:  Diagnosis Date  . Allergic rhinitis 07/1998  . Aortic atherosclerosis ([place]) 09/2016   by CT  . Arthritis    BACK AND RIGHT KNEE  . CAD (coronary artery disease) 09/2016   RCA, LAD by CT  . COPD (chronic obstructive pulmonary disease) ([place]) 07/1998   centrilobular emphysema (09/2016) w ongoing tobacco use, spirometry (02/2013)  . ETOH abuse   . Headache    migraine- x1  . Hepatitis    pos test once not now,  Positive   . HTN (hypertension) 05/1998  . Hyperlipidemia 06/1996  . Lumbar spinal stenosis 03/2013    s/p laminectomy  . Persistent atrial fibrillation ([place])   . 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 PVC's (premature ventricular contractions)   . 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 Shortness of breath dyspnea    WITH EXERTION  . Smoker    Past Surgical History:  Procedure Laterality Date  . ANTERIOR CERVICAL DECOMP/DISCECTOMY FUSION  2003   C3-7  . BACK SURGERY  2007  . CARDIAC CATHETERIZATION N/A 07/01/2015   Procedure: Left Heart Cath and Coronary Angiography;  Surgeon: Wellington Hampshire, MD;  Location: [place] CV LAB;  Service: Cardiovascular;  Laterality: N/A;  . CARDIOVERSION N/A 05/06/2020   Procedure: CARDIOVERSION;  Surgeon: Kate Sable, MD;  Location: [facility] ORS;  Service: Cardiovascular;  Laterality: N/A;  . COLONOSCOPY  09/2010   HPx2, diverticula, hemorrhoids, rpt 10 yrs [place])  . ETT  2010   normal, ABIs normal  . JOINT REPLACEMENT    . KNEE ARTHROSCOPY Right 11   cartlage, ligaments  . KNEE ARTHROSCOPY Left 84  . LUMBAR LAMINECTOMY  06/2020   Bilateral L2-3 laminectomy [name])  . LUMBAR LAMINECTOMY/DECOMPRESSION MICRODISCECTOMY Left 03/27/2013   Procedure: LUMBAR LAMINECTOMY/DECOMPRESSION MICRODISCECTOMY 2 LEVELS;  Surgeon: Ophelia Charter, MD;  Location: [place] NEURO ORS;  Service: Neurosurgery;  Laterality: Left;  LEFT Lumbar Three Four diskectomy with Lumbar Three-Four, Four-Five Laminectomy. L3/4 ahd L4/5 for LSS [name])   . LUMBAR LAMINECTOMY/DECOMPRESSION MICRODISCECTOMY Bilateral  07/15/2020   Procedure: LAMINECTOMY AND FORAMINOTOMY BILATERAL LUMBAR TWO-LUMBAR THREE;  Surgeon: Newman Pies, MD;  Location: [place];  Service: Neurosurgery;  Laterality: Bilateral;  . POSTERIOR CERVICAL FUSION/FORAMINOTOMY N/A 05/14/2020   Procedure: LAMINECTOMY, POSTERIOR INSTRUMENTATION AND FUSION, CERVICAL ONE- CERVICAL TWO, CERVICAL TWO- CERVICAL THREE;  Surgeon: Newman Pies, MD;  Location: [place];   Service: Neurosurgery;  Laterality: N/A;  . SHOULDER ARTHROSCOPY Right 11  . spriometry  02/2013   COPD with some restriction thought to be obesity related ([place])  . TOTAL KNEE ARTHROPLASTY Right 06/27/2014   Procedure: RIGHT TOTAL KNEE ARTHROPLASTY;  Surgeon: Mcarthur Rossetti, MD;  Location: [facility];  Service: Orthopedics;  Laterality: Right;  . US ECHOCARDIOGRAPHY  01/2013   WNL, mild diastolic dysfunction   Family History  Problem Relation Age of Onset  . Coronary artery disease Mother   . Hypertension Mother   . Stroke Mother   . Coronary artery disease Father   . Hypertension Father   . Diabetes Father   . Stroke Father   . Pneumonia Sister   . Irregular heart beat Brother   . Maple syrup urine disease Brother   . Cancer Neg Hx    Social History   Tobacco Use  . Smoking status: Current Every Day Smoker    Packs/day: 0.50    Years: 40.00    Pack years: 20.00    Types: Cigarettes  . Smokeless tobacco: Never Used  Vaping Use  . Vaping Use: Never used  Substance Use Topics  . Alcohol use: Yes    Alcohol/week: 28.0 standard drinks    Types: 28 Cans of beer per week    Comment: 3 beers daily  . Drug use: No    Pertinent Clinical Results:  LABS: Labs reviewed: Acceptable for surgery.  Hospital Outpatient Visit on 10/26/2020  Component Date Value Ref Range Status  . Sodium 10/26/2020 136  135 - 145 mmol/L Final  . Potassium 10/26/2020 4.2  3.5 - 5.1 mmol/L Final  . Chloride 10/26/2020 100  98 - 111 mmol/L Final  . CO2 10/26/2020 27  22 - 32 mmol/L Final  . Glucose, Bld 10/26/2020 110* 70 - 99 mg/dL Final   Glucose reference range applies only to samples taken after fasting for at least 8 hours.  . BUN 10/26/2020 11  8 - 23 mg/dL Final  . Creatinine, Ser 10/26/2020 0.72  0.61 - 1.24 mg/dL Final  . Calcium 10/26/2020 9.2  8.9 - 10.3 mg/dL Final  . GFR, Estimated 10/26/2020 >60  >60 mL/min Final   Comment: (NOTE) Calculated using the CKD-EPI Creatinine  Equation (2021)   . Anion gap 10/26/2020 9  5 - 15 Final   Performed at [facility], [address] [place], [place] [address]  . WBC 10/26/2020 5.1  4.0 - 10.5 K/uL Final  . RBC 10/26/2020 3.88* 4.22 - 5.81 MIL/uL Final  . Hemoglobin 10/26/2020 13.0  13.0 - 17.0 g/dL Final  . HCT 10/26/2020 37.2* 39.0 - 52.0 % Final  . MCV 10/26/2020 95.9  80.0 - 100.0 fL Final  . MCH 10/26/2020 33.5  26.0 - 34.0 pg Final  . MCHC 10/26/2020 34.9  30.0 - 36.0 g/dL Final  . RDW 10/26/2020 13.2  11.5 - 15.5 % Final  . Platelets 10/26/2020 222  150 - 400 K/uL Final  . nRBC 10/26/2020 0.0  0.0 - 0.2 % Final   Performed at [facility], [address], [place], [place] [address]  ECG: Date: 10/26/2020 Time ECG obtained: 1011 AM Rate: 73 bpm Rhythm: atrial fibrillation Axis (leads I and aVF): Normal Intervals: QRS 94 ms. QTc 420 ms. ST segment and T wave changes: Nonspecific ST abnormality  Comparison: Similar to previous tracing obtained on 08/24/2020; rate now controlled (no RVR)   IMAGING / PROCEDURES: ECHOCARDIOGRAM performed on 03/31/2020 1. Left ventricular ejection fraction, by estimation, is 45 to 50%.  2. The left ventricle has mildly decreased function.  3. The left ventricle demonstrates global hypokinesis.  4. There is mild left ventricular hypertrophy.  5. Left ventricular diastolic parameters are indeterminate.  6. Right ventricular systolic function is mildly reduced.  7. The right ventricular size is mildly enlarged.  8. Tricuspid regurgitation signal is inadequate for assessing PA pressure.  9. Right atrial size was mildly dilated.  10. The mitral valve is normal in structure. Trivial mitral valve regurgitation.  11. The aortic valve is tricuspid. Aortic valve regurgitation is not visualized. No aortic stenosis is present.  12. The inferior vena cava is normal in size with greater than 50% respiratory variability, suggesting right atrial pressure of 3 mmHg.    LEFT HEART CATHETERIZATION AND CORONARY ANGIOGRAPHY performed on 07/01/2015 1. Mildly calcified coronary arteries with mild two-vessel coronary artery disease involving the proximal to mid LAD and proximal to mid RCA.    30% stenosis of the proximal LAD to mid LAD  20% stenosis of the proximal RCA to mid RCA 2. No evidence of obstructive disease 3. Normal left ventricular systolic function; LVEF 16-10% by visual estimate 4. No wall motion abnormalities 5. Left ventricular cavity size normal 6. Moderately elevated LVEDP at 24 mmHg    Impression and Plan:  DIANDRE MERICA has been referred for pre-anesthesia review and clearance prior to him undergoing the planned anesthetic and procedural courses. Available labs, pertinent testing, and imaging results were personally reviewed by me. This patient has been appropriately cleared by cardiology with an overall ACCEPTABLE risk of significant perioperative cardiovascular complications.  Based on clinical review performed today (10/28/20), barring any significant acute changes in the patient's overall condition, it is anticipated that he will be able to proceed with the planned surgical intervention. Any acute changes in clinical condition may necessitate his procedure being postponed and/or cancelled. Patient will meet with anesthesia team (MD and/or CRNA) on this day of his procedure for preoperative evaluation/assessment.   Pre-surgical instructions were reviewed with the patient during his PAT appointment and questions were fielded by PAT clinical staff. Patient was advised that if any questions or concerns arise prior to his procedure then he should return a call to PAT and/or his surgeon's office to discuss.  Honor Loh, MSN, APRN, FNP-C, CEN [facility]  Peri-operative Services Nurse Practitioner Phone: [phone] 10/28/20 5:03 PM  NOTE: This note has been prepared using Dragon dictation software. Despite my best  ability to proofread, there is always the potential that unintentional transcriptional errors may still occur from this process.

## 2020-10-29 ENCOUNTER — Other Ambulatory Visit
Admission: RE | Admit: 2020-10-29 | Discharge: 2020-10-29 | Disposition: A | Discharge status: Home / Self Care | Payer: Medicare Other | Source: Ambulatory Visit | Attending: Urology | Admitting: Urology

## 2020-10-29 ENCOUNTER — Other Ambulatory Visit: Payer: Self-pay

## 2020-10-29 DIAGNOSIS — Z01812 Encounter for preprocedural laboratory examination: Secondary | ICD-10-CM | POA: Insufficient documentation

## 2020-10-29 DIAGNOSIS — Z20822 Contact with and (suspected) exposure to covid-19: Secondary | ICD-10-CM | POA: Insufficient documentation

## 2020-10-29 LAB — SARS CORONAVIRUS 2 (TAT 6-24 HRS): SARS Coronavirus 2: NEGATIVE

## 2020-11-02 ENCOUNTER — Ambulatory Visit: Payer: Medicare Other

## 2020-11-02 ENCOUNTER — Other Ambulatory Visit: Payer: Self-pay

## 2020-11-02 ENCOUNTER — Ambulatory Visit
Admission: RE | Admit: 2020-11-02 | Discharge: 2020-11-02 | Disposition: A | Discharge status: Home / Self Care | Payer: Medicare Other | Attending: Urology | Admitting: Urology

## 2020-11-02 ENCOUNTER — Encounter: Payer: Self-pay | Admitting: Urology

## 2020-11-02 ENCOUNTER — Ambulatory Visit: Payer: Medicare Other | Admitting: Urgent Care

## 2020-11-02 ENCOUNTER — Encounter
Admission: RE | Disposition: A | Discharge status: Home / Self Care | Payer: Self-pay | Source: Home / Self Care | Attending: Urology

## 2020-11-02 DIAGNOSIS — Z7951 Long term (current) use of inhaled steroids: Secondary | ICD-10-CM | POA: Insufficient documentation

## 2020-11-02 DIAGNOSIS — Z96651 Presence of right artificial knee joint: Secondary | ICD-10-CM | POA: Diagnosis not present

## 2020-11-02 DIAGNOSIS — Z981 Arthrodesis status: Secondary | ICD-10-CM | POA: Insufficient documentation

## 2020-11-02 DIAGNOSIS — F17210 Nicotine dependence, cigarettes, uncomplicated: Secondary | ICD-10-CM | POA: Diagnosis not present

## 2020-11-02 DIAGNOSIS — J432 Centrilobular emphysema: Secondary | ICD-10-CM | POA: Insufficient documentation

## 2020-11-02 DIAGNOSIS — N138 Other obstructive and reflux uropathy: Secondary | ICD-10-CM | POA: Insufficient documentation

## 2020-11-02 DIAGNOSIS — I4819 Other persistent atrial fibrillation: Secondary | ICD-10-CM | POA: Insufficient documentation

## 2020-11-02 DIAGNOSIS — E785 Hyperlipidemia, unspecified: Secondary | ICD-10-CM | POA: Diagnosis not present

## 2020-11-02 DIAGNOSIS — R3914 Feeling of incomplete bladder emptying: Secondary | ICD-10-CM | POA: Diagnosis not present

## 2020-11-02 DIAGNOSIS — Z7901 Long term (current) use of anticoagulants: Secondary | ICD-10-CM | POA: Insufficient documentation

## 2020-11-02 DIAGNOSIS — I700 Atherosclerosis of aorta: Secondary | ICD-10-CM | POA: Insufficient documentation

## 2020-11-02 DIAGNOSIS — Z7982 Long term (current) use of aspirin: Secondary | ICD-10-CM | POA: Insufficient documentation

## 2020-11-02 DIAGNOSIS — Z87440 Personal history of urinary (tract) infections: Secondary | ICD-10-CM | POA: Diagnosis not present

## 2020-11-02 DIAGNOSIS — I10 Essential (primary) hypertension: Secondary | ICD-10-CM | POA: Diagnosis not present

## 2020-11-02 DIAGNOSIS — J449 Chronic obstructive pulmonary disease, unspecified: Secondary | ICD-10-CM | POA: Diagnosis not present

## 2020-11-02 DIAGNOSIS — N401 Enlarged prostate with lower urinary tract symptoms: Secondary | ICD-10-CM | POA: Diagnosis not present

## 2020-11-02 DIAGNOSIS — I2510 Atherosclerotic heart disease of native coronary artery without angina pectoris: Secondary | ICD-10-CM | POA: Diagnosis not present

## 2020-11-02 HISTORY — DX: Ventricular premature depolarization: I49.3

## 2020-11-02 HISTORY — DX: Other persistent atrial fibrillation: I48.19

## 2020-11-02 HISTORY — PX: CYSTOSCOPY WITH INSERTION OF UROLIFT: SHX6678

## 2020-11-02 SURGERY — CYSTOSCOPY WITH INSERTION OF UROLIFT
Anesthesia: General | Site: Prostate

## 2020-11-02 MED ORDER — FENTANYL CITRATE (PF) 100 MCG/2ML IJ SOLN
INTRAMUSCULAR | Status: AC
  Filled 2020-11-02: qty 2

## 2020-11-02 MED ORDER — PROPOFOL 10 MG/ML IV BOLUS
INTRAVENOUS | Status: AC
  Filled 2020-11-02: qty 20

## 2020-11-02 MED ORDER — EPHEDRINE 5 MG/ML INJ
INTRAVENOUS | Status: AC
  Filled 2020-11-02: qty 10

## 2020-11-02 MED ORDER — PROPOFOL 10 MG/ML IV BOLUS
INTRAVENOUS | Status: DC | PRN
  Administered 2020-11-02: 150 ug/kg/min via INTRAVENOUS

## 2020-11-02 MED ORDER — OXYCODONE HCL 5 MG PO TABS
5.0000 mg | ORAL_TABLET | ORAL | Status: DC | PRN
  Administered 2020-11-02: 5 mg via ORAL

## 2020-11-02 MED ORDER — LIDOCAINE HCL (PF) 2 % IJ SOLN
INTRAMUSCULAR | Status: AC
  Filled 2020-11-02: qty 5

## 2020-11-02 MED ORDER — PHENYLEPHRINE HCL (PRESSORS) 10 MG/ML IV SOLN
INTRAVENOUS | Status: AC
  Filled 2020-11-02: qty 1

## 2020-11-02 MED ORDER — LIDOCAINE HCL (CARDIAC) PF 100 MG/5ML IV SOSY
PREFILLED_SYRINGE | INTRAVENOUS | Status: DC | PRN
  Administered 2020-11-02: 60 mg via INTRAVENOUS

## 2020-11-02 MED ORDER — SODIUM CHLORIDE 0.9 % IV SOLN
INTRAVENOUS | Status: DC | PRN
  Administered 2020-11-02: 200 ug via INTRAVENOUS

## 2020-11-02 MED ORDER — FENTANYL CITRATE (PF) 100 MCG/2ML IJ SOLN: 25.0000 ug | INTRAMUSCULAR | Status: DC | PRN

## 2020-11-02 MED ORDER — FUROSEMIDE 10 MG/ML IJ SOLN: 10.0000 mg | Freq: Once | INTRAMUSCULAR | Status: AC

## 2020-11-02 MED ORDER — CEFAZOLIN SODIUM-DEXTROSE 2-4 GM/100ML-% IV SOLN
INTRAVENOUS | Status: AC
  Filled 2020-11-02: qty 100

## 2020-11-02 MED ORDER — OXYCODONE HCL 5 MG PO TABS
ORAL_TABLET | ORAL | Status: AC
  Filled 2020-11-02: qty 1

## 2020-11-02 MED ORDER — LACTATED RINGERS IV SOLN: INTRAVENOUS | Status: DC

## 2020-11-02 MED ORDER — MIDAZOLAM HCL 2 MG/2ML IJ SOLN
INTRAMUSCULAR | Status: DC | PRN
  Administered 2020-11-02: 2 mg via INTRAVENOUS

## 2020-11-02 MED ORDER — FAMOTIDINE 20 MG PO TABS: 20.0000 mg | ORAL_TABLET | Freq: Once | ORAL | Status: AC

## 2020-11-02 MED ORDER — DEXAMETHASONE SODIUM PHOSPHATE 10 MG/ML IJ SOLN
INTRAMUSCULAR | Status: AC
  Filled 2020-11-02: qty 1

## 2020-11-02 MED ORDER — CHLORHEXIDINE GLUCONATE 0.12 % MT SOLN
OROMUCOSAL | Status: AC
  Filled 2020-11-02: qty 15

## 2020-11-02 MED ORDER — ONDANSETRON HCL 4 MG/2ML IJ SOLN
INTRAMUSCULAR | Status: DC | PRN
  Administered 2020-11-02: 4 mg via INTRAVENOUS

## 2020-11-02 MED ORDER — CEFAZOLIN SODIUM-DEXTROSE 2-4 GM/100ML-% IV SOLN
2.0000 g | INTRAVENOUS | Status: AC
  Administered 2020-11-02: 2 g via INTRAVENOUS

## 2020-11-02 MED ORDER — SUCCINYLCHOLINE CHLORIDE 200 MG/10ML IV SOSY
PREFILLED_SYRINGE | INTRAVENOUS | Status: AC
  Filled 2020-11-02: qty 10

## 2020-11-02 MED ORDER — ORAL CARE MOUTH RINSE
15.0000 mL | Freq: Once | OROMUCOSAL | Status: AC
  Administered 2020-11-02: 15 mL via OROMUCOSAL

## 2020-11-02 MED ORDER — PROPOFOL 500 MG/50ML IV EMUL
INTRAVENOUS | Status: AC
  Filled 2020-11-02: qty 50

## 2020-11-02 MED ORDER — ROCURONIUM BROMIDE 10 MG/ML (PF) SYRINGE
PREFILLED_SYRINGE | INTRAVENOUS | Status: AC
  Filled 2020-11-02: qty 10

## 2020-11-02 MED ORDER — KETOROLAC TROMETHAMINE 30 MG/ML IJ SOLN
INTRAMUSCULAR | Status: DC | PRN
  Administered 2020-11-02: 30 mg via INTRAVENOUS

## 2020-11-02 MED ORDER — CHLORHEXIDINE GLUCONATE 0.12 % MT SOLN: 15.0000 mL | Freq: Once | OROMUCOSAL | Status: AC

## 2020-11-02 MED ORDER — PROPOFOL 10 MG/ML IV BOLUS
INTRAVENOUS | Status: DC | PRN
  Administered 2020-11-02: 30 mg via INTRAVENOUS

## 2020-11-02 MED ORDER — FENTANYL CITRATE (PF) 100 MCG/2ML IJ SOLN
INTRAMUSCULAR | Status: DC | PRN
  Administered 2020-11-02 (×3): 25 ug via INTRAVENOUS

## 2020-11-02 MED ORDER — MIDAZOLAM HCL 2 MG/2ML IJ SOLN
INTRAMUSCULAR | Status: AC
  Filled 2020-11-02: qty 2

## 2020-11-02 MED ORDER — FAMOTIDINE 20 MG PO TABS
ORAL_TABLET | ORAL | Status: AC
  Administered 2020-11-02: 20 mg via ORAL
  Filled 2020-11-02: qty 1

## 2020-11-02 MED ORDER — SODIUM CHLORIDE 0.9 % IV BOLUS
500.0000 mL | Freq: Once | INTRAVENOUS | Status: AC
  Administered 2020-11-02: 500 mL via INTRAVENOUS

## 2020-11-02 MED ORDER — ONDANSETRON HCL 4 MG/2ML IJ SOLN
INTRAMUSCULAR | Status: AC
  Filled 2020-11-02: qty 2

## 2020-11-02 MED ORDER — ONDANSETRON HCL 4 MG/2ML IJ SOLN
4.0000 mg | Freq: Once | INTRAMUSCULAR | Status: DC | PRN
Start: 2020-11-02 — End: 2020-11-02

## 2020-11-02 MED ORDER — HYDROCODONE-ACETAMINOPHEN 5-325 MG PO TABS: 1.0000 | ORAL_TABLET | Freq: Four times a day (QID) | ORAL | 0 refills | Status: DC | PRN

## 2020-11-02 MED ORDER — FUROSEMIDE 10 MG/ML IJ SOLN
INTRAMUSCULAR | Status: AC
  Administered 2020-11-02: 10 mg via INTRAVENOUS
  Filled 2020-11-02: qty 2

## 2020-11-02 SURGICAL SUPPLY — 12 items
BAG DRAIN CYSTO-URO LG1000N (MISCELLANEOUS) ×2 IMPLANT
GLOVE SURG ENC MOIS LTX SZ6.5 (GLOVE) ×2 IMPLANT
GOWN STRL REUS W/ TWL LRG LVL3 (GOWN DISPOSABLE) ×2 IMPLANT
GOWN STRL REUS W/TWL LRG LVL3 (GOWN DISPOSABLE) ×4
KIT TURNOVER CYSTO (KITS) ×2 IMPLANT
MANIFOLD NEPTUNE II (INSTRUMENTS) ×2 IMPLANT
PACK CYSTO AR (MISCELLANEOUS) ×2 IMPLANT
SET CYSTO W/LG BORE CLAMP LF (SET/KITS/TRAYS/PACK) ×2 IMPLANT
SURGILUBE 2OZ TUBE FLIPTOP (MISCELLANEOUS) IMPLANT
SYSTEM UROLIFT (Male Continence) ×10 IMPLANT
WATER STERILE IRR 1000ML POUR (IV SOLUTION) ×2 IMPLANT
WATER STERILE IRR 3000ML UROMA (IV SOLUTION) ×2 IMPLANT

## 2020-11-02 NOTE — Progress Notes (Signed)
Patient complains of pressure, feeling like he needs to urinate.  Bladder scan read minimal urine in bladder <65ml.

## 2020-11-02 NOTE — Op Note (Signed)
Preoperative diagnosis: BPH with obstructive symptomatology   Postoperative diagnosis: BPH with obstructive symptomatology   Principal procedure: Urolift procedure, with the placement of 5 implants.   Surgeon: Hollice Espy   Anesthesia: MAC   Complications: None   Drains: None   Estimated blood loss: < 5 mL   Indications: 70 year-old male with obstructive symptomatology secondary to BPH.  The patient's symptoms have progressed, and he has requested further management.  Management options including TURP with resection/ablation of the prostate as well as Urolift were discussed.  The patient has chosen to have a Urolift procedure.  He has been instructed to the procedure as well as risks and complications which include but are not limited to infection, bleeding, and inadequate treatment with the Urolift procedure alone, anesthetic complications, among others.  He understands these and desires to proceed.   Findings: Using the 17 French cystoscope, urethra and bladder were inspected.  There were no urethral lesions.  Prostatic urethra was obstructed secondary to bilobar hypertrophy.  The bladder was inspected circumferentially.  This revealed trabeculation.   Description of procedure: The patient was properly identified in the holding area.  He received preoperative IV antibiotics.  He was taken to the operating room where MAC was administed.  He is placed in the dorsolithotomy position.  Genitalia and perineum were prepped and draped.  Proper timeout was performed.   Male sounds were used to dilate the urethral meatus.  A 91F cystoscope was inserted into the bladder with findings as described above.  The 1st pair of implants were placed at the bladder neck ~1.5 cm from the bladder Neck. The 2nd pair of implants were placed at the level of the verumontanum.   A repeat cysto was performed and another single implant was placed on the left in the middle of the first two implants on that side.    A final cystoscopy was conducted first to inspect the location and state of each implant and second, to confirm the presence of a continuous anterior channel was present through the prostatic urethra with irrigation flow turned off.    Five implants were delivered in total.    Following this, the scope was removed.  After anesthetic reversal he was transported to the PACU in stable condition.  He tolerated the procedure well.   Plan: He will follow-up with me in 4 to 6 weeks for IPSS/PVR.

## 2020-11-02 NOTE — Transfer of Care (Signed)
Immediate Anesthesia Transfer of Care Note  Patient: [name]  Procedure(s) Performed: CYSTOSCOPY WITH INSERTION OF UROLIFT (N/A Prostate)  Patient Location: PACU  Anesthesia Type:MAC  Level of Consciousness: drowsy and patient cooperative  Airway & Oxygen Therapy: Patient Spontanous Breathing and Patient connected to face mask oxygen  Post-op Assessment: Report given to RN, Post -op Vital signs reviewed and stable and Patient moving all extremities X 4  Post vital signs: Reviewed and stable  Last Vitals:  Vitals Value Taken Time  BP 81/57 11/02/20 0815  Temp 36.4 C 11/02/20 0815  Pulse 68 11/02/20 0818  Resp 6 11/02/20 0818  SpO2 100 % 11/02/20 0818  Vitals shown include unvalidated device data.  Last Pain:  Vitals:   11/02/20 0655  TempSrc: Tympanic  PainSc: 0-No pain         Complications: No complications documented.

## 2020-11-02 NOTE — Anesthesia Preprocedure Evaluation (Signed)
Anesthesia Evaluation  Patient identified by MRN, date of birth, ID band Patient awake    Reviewed: Allergy & Precautions, H&P , NPO status , Patient's Chart, lab work & pertinent test results, reviewed documented beta blocker date and time   Airway Mallampati: III  TM Distance: >3 FB Neck ROM: full    Dental  (+) Teeth Intact   Pulmonary shortness of breath and with exertion, COPD, Current Smoker,    Pulmonary exam normal        Cardiovascular Exercise Tolerance: Good hypertension, On Medications + CAD and + Peripheral Vascular Disease  Normal cardiovascular exam Rate:Normal     Neuro/Psych  Headaches, PSYCHIATRIC DISORDERS  Neuromuscular disease    GI/Hepatic negative GI ROS, (+) Hepatitis -  Endo/Other  negative endocrine ROS  Renal/GU Renal disease  negative genitourinary   Musculoskeletal   Abdominal   Peds  Hematology  (+) Blood dyscrasia, anemia ,   Anesthesia Other Findings   Reproductive/Obstetrics negative OB ROS                             Anesthesia Physical Anesthesia Plan  ASA: III  Anesthesia Plan: General LMA   Post-op Pain Management:    Induction:   PONV Risk Score and Plan:   Airway Management Planned:   Additional Equipment:   Intra-op Plan:   Post-operative Plan:   Informed Consent: I have reviewed the patients History and Physical, chart, labs and discussed the procedure including the risks, benefits and alternatives for the proposed anesthesia with the patient or authorized representative who has indicated his/her understanding and acceptance.       Plan Discussed with: CRNA  Anesthesia Plan Comments:         Anesthesia Quick Evaluation

## 2020-11-02 NOTE — H&P (Signed)
H&P updated today 11/02/20 R[facility] CTAB S/p cardiac clearance,  apixaban held for 3 days prior to his procedure   [name] 06-19-1951 [id number]  Referring provider: Ria Bush, MD [address] [place],  [place] [address]     Chief Complaint  Patient presents with  . Urinary Retention    HPI: 70 year old male who presents today for further discussion of consideration of outlet procedure in the setting of BPH and incomplete bladder emptying.  He initially presented with UTI, obstructive voiding symptoms and history of retention.  He ultimately passed a voiding trial.  He has been on Flomax with improvement of his urinary symptoms since.  He continues to have significantly elevated PVRs in the 300s including today.  Subjectively, his symptoms are improving.  IPSS as below.  Since last visit, he has had a cervical spine fusion as well as an L2-L3 laminectomy only 6 weeks ago for which he still recovering.  He continues to be interested in outlet procedure given his concern for retrograde ejaculation associated with Flomax.  He is currently on Eliquis for A. fib.        Results for orders placed or performed in visit on 08/27/20  Bladder Scan (Post Void Residual) in office  Result Value Ref Range   Scan Result 340              IPSS           Row Name 08/27/20 0900               International Prostate Symptom Score    How often have you had the sensation of not emptying your bladder? Not at All      How often have you had to urinate less than every two hours? Not at All      How often have you found you stopped and started again several times when you urinated? Less than half the time      How often have you found it difficult to postpone urination? Not at All      How often have you had a weak urinary stream? Less than 1 in 5 times      How often have you had to strain to start urination? Not at All       How many times did you typically get up at night to urinate? 2 Times      Total IPSS Score 5                    Quality of Life due to urinary symptoms    If you were to spend the rest of your life with your urinary condition just the way it is now how would you feel about that? Mostly Satisfied             Score:  1-7 Mild 8-19 Moderate 20-35 Severe    PMH:     Past Medical History:  Diagnosis Date  . Allergic rhinitis 07/1998  . Aortic atherosclerosis ([place]) 09/2016   by CT  . Arthritis    BACK AND RIGHT KNEE  . CAD (coronary artery disease) 09/2016   RCA, LAD by CT  . COPD (chronic obstructive pulmonary disease) ([place]) 07/1998   centrilobular emphysema (09/2016) w ongoing tobacco use, spirometry (02/2013)  . Dysrhythmia    PVC'S  . ETOH abuse   . Headache    migraine- x1  . Hepatitis    pos test once not now,  Positive   . HTN (hypertension) 05/1998  . Hyperlipidemia 06/1996  . Lumbar spinal stenosis 03/2013   s/p laminectomy  . 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 Shortness of breath dyspnea    WITH EXERTION  . Smoker     Surgical History:      Past Surgical History:  Procedure Laterality Date  . ANTERIOR CERVICAL DECOMP/DISCECTOMY FUSION  2003   C3-7  . BACK SURGERY  2007  . CARDIAC CATHETERIZATION N/A 07/01/2015   Procedure: Left Heart Cath and Coronary Angiography;  Surgeon: Wellington Hampshire, MD;  Location: [facility];  Service: Cardiovascular;  Laterality: N/A;  . CARDIOVERSION N/A 05/06/2020   Procedure: CARDIOVERSION;  Surgeon: Kate Sable, MD;  Location: [facility];  Service: Cardiovascular;  Laterality: N/A;  . COLONOSCOPY  09/2010   HPx2, diverticula, hemorrhoids, rpt 10 yrs [place])  . ETT  2010   normal, ABIs normal  . JOINT REPLACEMENT    . KNEE ARTHROSCOPY Right 11    cartlage, ligaments  . KNEE ARTHROSCOPY Left 84  . LUMBAR LAMINECTOMY  06/2020   Bilateral L2-3 laminectomy [name])  . LUMBAR LAMINECTOMY/DECOMPRESSION MICRODISCECTOMY Left 03/27/2013   Procedure: LUMBAR LAMINECTOMY/DECOMPRESSION MICRODISCECTOMY 2 LEVELS;  Surgeon: Ophelia Charter, MD;  Location: [facility];  Service: Neurosurgery;  Laterality: Left;  LEFT Lumbar Three Four diskectomy with Lumbar Three-Four, Four-Five Laminectomy. L3/4 ahd L4/5 for LSS [name])   . LUMBAR LAMINECTOMY/DECOMPRESSION MICRODISCECTOMY Bilateral 07/15/2020   Procedure: LAMINECTOMY AND FORAMINOTOMY BILATERAL LUMBAR TWO-LUMBAR THREE;  Surgeon: Newman Pies, MD;  Location: [facility];  Service: Neurosurgery;  Laterality: Bilateral;  . POSTERIOR CERVICAL FUSION/FORAMINOTOMY N/A 05/14/2020   Procedure: LAMINECTOMY, POSTERIOR INSTRUMENTATION AND FUSION, CERVICAL ONE- CERVICAL TWO, CERVICAL TWO- CERVICAL THREE;  Surgeon: Newman Pies, MD;  Location: [facility];  Service: Neurosurgery;  Laterality: N/A;  . SHOULDER ARTHROSCOPY Right 11  . spriometry  02/2013   COPD with some restriction thought to be obesity related ([place])  . TOTAL KNEE ARTHROPLASTY Right 06/27/2014   Procedure: RIGHT TOTAL KNEE ARTHROPLASTY;  Surgeon: Mcarthur Rossetti, MD;  Location: [facility];  Service: Orthopedics;  Laterality: Right;  . US ECHOCARDIOGRAPHY  01/2013   WNL, mild diastolic dysfunction    Home Medications:       Allergies as of 08/27/2020      Reactions   Chantix [varenicline] Other (See Comments)   headaches   Other Other (See Comments)         Medication List       Accurate as of August 27, 2020  9:53 AM. If you have any questions, ask your nurse or doctor.        Albuterol Sulfate 108 (90 Base) MCG/ACT Aepb Commonly known as: ProAir RespiClick Inhale 2 puffs into the lungs every 6 (six) hours as needed (cough, shortness of breath).   apixaban 5 MG Tabs tablet Commonly known as:  ELIQUIS Take 1 tablet (5 mg total) by mouth 2 (two) times daily. Restart on 07/21/2019   aspirin 81 MG EC tablet Take 1 tablet by mouth daily.   atorvastatin 10 MG tablet Commonly known as: LIPITOR TAKE 1 TABLET(10 MG) BY MOUTH DAILY   budesonide-formoterol 80-4.5 MCG/ACT inhaler Commonly known as: SYMBICORT Inhale 2 puffs into the lungs 2 (two) times daily as needed (shortness of breath).   Cholecalciferol 25 MCG (1000 UT) tablet Take 1 tablet by  mouth daily.   Fluticasone-Salmeterol 100-50 MCG/DOSE Aepb Commonly known as: ADVAIR INHALE 1 INHALATION TWICE A DAY (RINSE MOUTH WELL WITH WATER AFTER EACH USE)   lisinopril 5 MG tablet Commonly known as: ZESTRIL Take 1 tablet (5 mg total) by mouth daily.   metoprolol tartrate 100 MG tablet Commonly known as: LOPRESSOR Take 1 tablet (100 mg total) by mouth 2 (two) times daily.   tamsulosin 0.4 MG Caps capsule Commonly known as: FLOMAX Take 1 capsule (0.4 mg total) by mouth daily.   tiotropium 18 MCG inhalation capsule Commonly known as: SPIRIVA INHALE 1 CAPSULE IN INHALER BY MOUTH DAILY   tiZANidine 4 MG tablet Commonly known as: ZANAFLEX Take 1 tablet by mouth 2 (two) times daily as needed.       Allergies:       Allergies  Allergen Reactions  . Chantix [Varenicline] Other (See Comments)    headaches  . Other Other (See Comments)    Family History:      Family History  Problem Relation Age of Onset  . Coronary artery disease Mother   . Hypertension Mother   . Stroke Mother   . Coronary artery disease Father   . Hypertension Father   . Diabetes Father   . Stroke Father   . Pneumonia Sister   . Irregular heart beat Brother   . Maple syrup urine disease Brother   . Cancer Neg Hx     Social History:  reports that he has been smoking cigarettes. He has a 20.00 pack-year smoking history. He has never used smokeless tobacco. He reports current alcohol use of about 28.0 standard  drinks of alcohol per week. He reports that he does not use drugs.   Physical Exam: BP (!) 160/99   Pulse 87   Ht 5\' 6"  (1.676 m)   Wt 187 lb (84.8 kg)   BMI 30.18 kg/m   Constitutional:  Alert and oriented, No acute distress. HEENT: [place] AT, moist mucus membranes.  Trachea midline, no masses. Cardiovascular: No clubbing, cyanosis, or edema. Respiratory: Normal respiratory effort, no increased work of breathing. Skin: No rashes, bruises or suspicious lesions. Neurologic: Grossly intact, no focal deficits, moving all 4 extremities. Psychiatric: Normal mood and affect.  Laboratory Data: Recent Labs       Lab Results  Component Value Date   WBC 4.7 07/02/2020   HGB 13.1 07/02/2020   HCT 38.7 (L) 07/02/2020   MCV 99.9 07/02/2020   PLT 232.0 07/02/2020      Recent Labs       Lab Results  Component Value Date   CREATININE 0.91 07/02/2020      Recent Labs       Lab Results  Component Value Date   PSA 1.12 07/02/2020   PSA 2.260 03/17/2020   PSA 1.81 09/30/2019       Assessment & Plan:    1. Benign prostatic hyperplasia with incomplete bladder emptying Obstructive urinary symptoms with history of retention and urinary tract infections now symptomatically improved on Flomax however continues to have a markedly elevated PVR  We discussed that it may be a neurological component and especially given his spinal issues however currently also has a component of obstruction  Based on previous conversation, he is interested in outlet procedure.  Due to his concern for sexual side effects he is most interested in UroLift.  He is an excellent candidate for this based on previous evaluation of his anatomy.  We discussed the procedure itself at length.  We discussed possible complications including risk of pain, infection, damage surrounding structures, clip complications, migration, and procedure failure amongst others.  All questions were answered.  He  like to defer this until about April to give more time to recover from his back surgery.  We will also need to have cardiac clearance to hold his Eliquis several days prior to the procedure.  He is agreeable this plan.  2. History of urinary retention As above - Bladder Scan (Post Void Residual) in office    Hollice Espy, MD  [facility] [address] [place], [place] [address] [phone]

## 2020-11-02 NOTE — Discharge Instructions (Signed)
Urolift Post-Operative Instructions     Patient Expectations   1. Mild blood in your urine for about 1 week.  2. Urinary buring, frequency, and urgency for 10 days.  3. Mild pelvic pain 1-2 weeks.     Return to Activity     1. Drink water post procedure.  2. Take meds as needed.  Tylenol and/or Motrin is most helpful.  You may also by Pyridium/Azo over-the-counter for urinary burning.  3. No lifting or straining 48hrs.  4. Other activity when they feel up to it.  5. You may stop taking Flomax 2 weeks post op if you are improving  6. You may resume blood thinners tomorrow AM if your urine is mostly clear (light pink or clear).  If you have any questions, call our office for instructions.  AMBULATORY SURGERY  DISCHARGE INSTRUCTIONS   1) The drugs that you were given will stay in your system until tomorrow so for the next 24 hours you should not:  A) Drive an automobile B) Make any legal decisions C) Drink any alcoholic beverage   2) You may resume regular meals tomorrow.  Today it is better to start with liquids and gradually work up to solid foods.  You may eat anything you prefer, but it is better to start with liquids, then soup and crackers, and gradually work up to solid foods.   3) Please notify your doctor immediately if you have any unusual bleeding, trouble breathing, redness and pain at the surgery site, drainage, fever, or pain not relieved by medication.    4) Additional Instructions:        Please contact your physician with any problems or Same Day Surgery at [phone], Monday through Friday 6 am to 4 pm, or [place] at [facility] number at [phone].

## 2020-11-03 NOTE — Anesthesia Postprocedure Evaluation (Signed)
Anesthesia Post Note  Patient: [name]  Procedure(s) Performed: CYSTOSCOPY WITH INSERTION OF UROLIFT (N/A Prostate)  Patient location during evaluation: PACU Anesthesia Type: General Level of consciousness: awake and alert Pain management: pain level controlled Vital Signs Assessment: post-procedure vital signs reviewed and stable Respiratory status: spontaneous breathing, nonlabored ventilation, respiratory function stable and patient connected to nasal cannula oxygen Cardiovascular status: blood pressure returned to baseline and stable Postop Assessment: no apparent nausea or vomiting Anesthetic complications: no   No complications documented.   Last Vitals:  Vitals:   11/02/20 1255 11/02/20 1500  BP: 135/82 (!) 144/88  Pulse: 86 61  Resp: 18 18  Temp:    SpO2: 99% 100%    Last Pain:  Vitals:   11/02/20 1255  TempSrc:   PainSc: 0-No pain                 Molli Barrows

## 2020-11-19 ENCOUNTER — Other Ambulatory Visit: Payer: Self-pay | Admitting: Family Medicine

## 2020-11-23 ENCOUNTER — Encounter: Payer: Self-pay | Admitting: Cardiology

## 2020-11-23 ENCOUNTER — Ambulatory Visit (INDEPENDENT_AMBULATORY_CARE_PROVIDER_SITE_OTHER): Payer: Medicare Other | Admitting: Cardiology

## 2020-11-23 ENCOUNTER — Other Ambulatory Visit: Payer: Self-pay

## 2020-11-23 VITALS — BP 170/96 | HR 68 | Ht 66.0 in | Wt 189.0 lb

## 2020-11-23 DIAGNOSIS — I4819 Other persistent atrial fibrillation: Secondary | ICD-10-CM | POA: Diagnosis not present

## 2020-11-23 DIAGNOSIS — I10 Essential (primary) hypertension: Secondary | ICD-10-CM

## 2020-11-23 DIAGNOSIS — I2510 Atherosclerotic heart disease of native coronary artery without angina pectoris: Secondary | ICD-10-CM | POA: Diagnosis not present

## 2020-11-23 MED ORDER — LISINOPRIL 10 MG PO TABS: 10.0000 mg | ORAL_TABLET | Freq: Every day | ORAL | 3 refills | Status: DC

## 2020-11-23 NOTE — Progress Notes (Signed)
Cardiology Office Note:    Date:  11/23/2020   ID:  [name], DOB 23-Jan-1951, MRN [id number]  PCP:  Ria Bush, MD  [facility] Cardiologist:  Kate Sable, MD  [place] Electrophysiologist:  None   Referring MD: Ria Bush, MD   Chief Complaint  Patient presents with  . Other    3 month follow up. Meds reviewed verbally with patient.     History of Present Illness:    [name] is a 70 y.o. male with a hx of persistent A. fib on Eliquis, nonobstructive CAD (LHC 2016 LAD 30%, RCA 20%), hypertension, hyperlipidemia,  COPD, current smoker x40+ years who presents for follow-up.    Being seen for persistent A. fib and hypertension.  Lopressor increased to 100 mg twice daily.  DC cardioversion attempt on 04/2020 was unsuccessful.  Evaluated by EP, deemed not a good candidate for follow-up ablation given history of COPD and ongoing smoker.   Patient states not taking medications as prescribed, takes Eliquis 5 mg daily, Lopressor 100 mg daily.  Denies palpitations, shortness of breath.  He still smokes.  States his blood pressure runs anywhere from low 086V to 784O systolic at home.   Prior notes  patient with history of neck pains, back pains with radiculopathy.  Diagnosed with cervical spinal stenosis.  Laminectomy is being planned.  Saw primary care provider for preop exam, EKG noted to be in atrial fibrillation rapid ventricular response.  He was started on Eliquis and Lopressor.  He states his Eliquis was not approved by his insurance.  Echo 03/2020 showed low normal systolic function, EF 96%. DC cardioversion was attempted on 05/06/2020 unsuccessfully.    Past Medical History:  Diagnosis Date  . Allergic rhinitis 07/1998  . Aortic atherosclerosis ([place]) 09/2016   by CT  . Arthritis    BACK AND RIGHT KNEE  . CAD (coronary artery disease) 09/2016   RCA, LAD by CT  . COPD (chronic obstructive pulmonary disease) ([place]) 07/1998   centrilobular  emphysema (09/2016) w ongoing tobacco use, spirometry (02/2013)  . ETOH abuse   . Headache    migraine- x1  . Hepatitis    pos test once not now,  Positive   . HTN (hypertension) 05/1998  . Hyperlipidemia 06/1996  . Lumbar spinal stenosis 03/2013   s/p laminectomy  . Persistent atrial fibrillation ([place])   . 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 PVC's (premature ventricular contractions)   . 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 Shortness of breath dyspnea    WITH EXERTION  . Smoker     Past Surgical History:  Procedure Laterality Date  . ANTERIOR CERVICAL DECOMP/DISCECTOMY FUSION  2003   C3-7  . BACK SURGERY  2007  . CARDIAC CATHETERIZATION N/A 07/01/2015   Procedure: Left Heart Cath and Coronary Angiography;  Surgeon: Wellington Hampshire, MD;  Location: [facility];  Service: Cardiovascular;  Laterality: N/A;  . CARDIOVERSION N/A 05/06/2020   Procedure: CARDIOVERSION;  Surgeon: Kate Sable, MD;  Location: [facility];  Service: Cardiovascular;  Laterality: N/A;  . COLONOSCOPY  09/2010   HPx2, diverticula, hemorrhoids, rpt 10 yrs Benson [place])  . CYSTOSCOPY WITH INSERTION OF UROLIFT N/A 11/02/2020   Procedure: CYSTOSCOPY WITH INSERTION OF UROLIFT;  Surgeon: Hollice Espy, MD;  Location: [facility];  Service: Urology;  Laterality: N/A;  . ETT  2010   normal, ABIs normal  .  JOINT REPLACEMENT    . KNEE ARTHROSCOPY Right 11   cartlage, ligaments  . KNEE ARTHROSCOPY Left 84  . LUMBAR LAMINECTOMY  06/2020   Bilateral L2-3 laminectomy [name])  . LUMBAR LAMINECTOMY/DECOMPRESSION MICRODISCECTOMY Left 03/27/2013   Procedure: LUMBAR LAMINECTOMY/DECOMPRESSION MICRODISCECTOMY 2 LEVELS;  Surgeon: Ophelia Charter, MD;  Location: [place] [facility];  Service: Neurosurgery;  Laterality: Left;  LEFT Lumbar Three Four diskectomy with Lumbar Three-Four, Four-Five Laminectomy. L3/4 ahd L4/5 for LSS [name])   . LUMBAR  LAMINECTOMY/DECOMPRESSION MICRODISCECTOMY Bilateral 07/15/2020   Procedure: LAMINECTOMY AND FORAMINOTOMY BILATERAL LUMBAR TWO-LUMBAR THREE;  Surgeon: Newman Pies, MD;  Location: [place];  Service: Neurosurgery;  Laterality: Bilateral;  . POSTERIOR CERVICAL FUSION/FORAMINOTOMY N/A 05/14/2020   Procedure: LAMINECTOMY, POSTERIOR INSTRUMENTATION AND FUSION, CERVICAL ONE- CERVICAL TWO, CERVICAL TWO- CERVICAL THREE;  Surgeon: Newman Pies, MD;  Location: [place];  Service: Neurosurgery;  Laterality: N/A;  . SHOULDER ARTHROSCOPY Right 11  . spriometry  02/2013   COPD with some restriction thought to be obesity related ([place])  . TOTAL KNEE ARTHROPLASTY Right 06/27/2014   Procedure: RIGHT TOTAL KNEE ARTHROPLASTY;  Surgeon: Mcarthur Rossetti, MD;  [facility];  Service: Orthopedics;  Laterality: Right;  . US ECHOCARDIOGRAPHY  01/2013   WNL, mild diastolic dysfunction    Current Medications: Current Meds  Medication Sig  . Albuterol Sulfate (PROAIR RESPICLICK) 973 (90 Base) MCG/ACT AEPB Inhale 2 puffs into the lungs every 6 (six) hours as needed (cough, shortness of breath).  [name] apixaban (ELIQUIS) 5 MG TABS tablet Take 5 mg by mouth daily.  [name] atorvastatin (LIPITOR) 10 MG tablet TAKE 1 TABLET(10 MG) BY MOUTH DAILY  . budesonide-formoterol (SYMBICORT) 80-4.5 MCG/ACT inhaler Inhale 2 puffs into the lungs 2 (two) times daily as needed (shortness of breath).  . Cholecalciferol 25 MCG (1000 UT) tablet Take 1,000 Units by mouth daily.  . Fluticasone-Salmeterol (ADVAIR) 100-50 MCG/DOSE AEPB Inhale 1 puff into the lungs daily as needed (Shortness of breath).  [name] lisinopril (ZESTRIL) 10 MG tablet Take 1 tablet (10 mg total) by mouth daily.  [name] loratadine (CLARITIN) 10 MG tablet Take 10 mg by mouth daily.  . metoprolol tartrate (LOPRESSOR) 100 MG tablet Take 50 mg by mouth 2 (two) times daily.  [name] OVER THE COUNTER MEDICATION Take 2 tablets by mouth daily. sulfurzyme  . OVER THE COUNTER MEDICATION  Take 1,500 mg by mouth daily. BLM Capsules - Young Living Essential Oils 750 mg each  . tiotropium (SPIRIVA) 18 MCG inhalation capsule Place 18 mcg into inhaler and inhale daily as needed.  . [DISCONTINUED] lisinopril (ZESTRIL) 5 MG tablet Take 1 tablet (5 mg total) by mouth daily.     Allergies:   Chantix [varenicline] and Other   Social History   Socioeconomic History  . Marital status: Married    Spouse name: Not on file  . Number of children: Not on file  . Years of education: Not on file  . Highest education level: Not on file  Occupational History  . Not on file  Tobacco Use  . Smoking status: Current Every Day Smoker    Packs/day: 0.50    Years: 40.00    Pack years: 20.00    Types: Cigarettes  . Smokeless tobacco: Never Used  Vaping Use  . Vaping Use: Never used  Substance and Sexual Activity  . Alcohol use: Yes    Alcohol/week: 28.0 standard drinks    Types: 28 Cans of beer per week    Comment: 3 beers daily  .  Drug use: No  . Sexual activity: Not Currently  Other Topics Concern  . Not on file  Social History Narrative   Lives with wife. Has 1 son, 2 daughters.    Occupation: Psychologist, clinical.    Activity: no exercise   Diet: good water, fruits/vegetables daily      Screened positive for OSA at recent hospitalization (03/2013)   Social Determinants of Health   Financial Resource Strain: Not on file  Food Insecurity: Not on file  Transportation Needs: Not on file  Physical Activity: Not on file  Stress: Not on file  Social Connections: Not on file     Family History: The patient's family history includes Coronary artery disease in his father and mother; Diabetes in his father; Hypertension in his father and mother; Irregular heart beat in his brother; Maple syrup urine disease in his brother; Pneumonia in his sister; Stroke in his father and mother. There is no history of Cancer.  ROS:   Please see the history of present illness.     All other  systems reviewed and are negative.  EKGs/Labs/Other Studies Reviewed:    The following studies were reviewed today:   EKG:  EKG is ordered today.  EKG showed atrial fibrillation, RVR, heart rate 68  Recent Labs: 03/27/2020: TSH 3.42 07/02/2020: ALT 17 10/26/2020: BUN 11; Creatinine, Ser 0.72; Hemoglobin 13.0; Platelets 222; Potassium 4.2; Sodium 136  Recent Lipid Panel    Component Value Date/Time   CHOL 165 07/02/2020 0815   TRIG 50.0 07/02/2020 0815   HDL 89.00 07/02/2020 0815   CHOLHDL 2 07/02/2020 0815   VLDL 10.0 07/02/2020 0815   LDLCALC 66 07/02/2020 0815   LDLDIRECT 82.0 09/07/2016 0900    Physical Exam:    VS:  BP (!) 170/96 (BP Location: Left Arm, Patient Position: Sitting, Cuff Size: Normal)   Pulse 68   Ht 5\' 6"  (1.676 m)   Wt 189 lb (85.7 kg)   SpO2 99%   BMI 30.51 kg/m     Wt Readings from Last 3 Encounters:  11/23/20 189 lb (85.7 kg)  11/02/20 187 lb 2.7 oz (84.9 kg)  10/26/20 187 lb 2.7 oz (84.9 kg)     GEN:  Well nourished, well developed in no acute distress HEENT: Normal NECK: No JVD; No carotid bruits LYMPHATICS: No lymphadenopathy CARDIAC: Irregular irregular, no murmurs, rubs, gallops RESPIRATORY: Decreased breath sounds, no wheezing ABDOMEN: Soft, non-tender, non-distended MUSCULOSKELETAL:  No edema; No deformity  SKIN: Warm and dry NEUROLOGIC:  Alert and oriented x 3 PSYCHIATRIC:  Normal affect   ASSESSMENT:    1. Persistent atrial fibrillation ([place])   2. Coronary artery disease involving native coronary artery of native heart without angina pectoris   3. Primary hypertension    PLAN:    In order of problems listed above:  1. Patient with persistent atrial fibrillation.  CHA2DS2-VASc score 3(age, htn, vasc).  Echo with low normal EF of about 50%.  failed DCCV on 05/06/2020.  Start Lopressor 50 mg twice daily, advised to take Eliquis 5 mg twice daily. 2. nonobstructive CAD (30% mLAD, 20% mRCA).  Asymptomatic. Continue Eliquis,  statin. 3. Hypertension, BP elevated.  Increase lisinopril to 10 mg daily, continue Lopressor.  Follow-up in 6 months   Medication Adjustments/Labs and Tests Ordered: Current medicines are reviewed at length with the patient today.  Concerns regarding medicines are outlined above.  Orders Placed This Encounter  Procedures  . EKG 12-Lead   Meds ordered this encounter  Medications  .  lisinopril (ZESTRIL) 10 MG tablet    Sig: Take 1 tablet (10 mg total) by mouth daily.    Dispense:  90 tablet    Refill:  3    Patient Instructions  Medication Instructions:  Metoprolol Tartrate  50 mg two times a day  (may spilt 100 mg pills in half)  Lisinopril  10 mg daily  *If you need a refill on your cardiac medications before your next appointment, please call your pharmacy*   Lab Work: None  Testing/Procedures: None  Follow-Up: At [facility], you and your health needs are our priority.  As part of our continuing mission to provide you with exceptional heart care, we have created designated Provider Care Teams.  These Care Teams include your primary Cardiologist (physician) and Advanced Practice Providers (APPs -  Physician Assistants and Nurse Practitioners) who all work together to provide you with the care you need, when you need it.   Your next appointment:   6 month(s)  The format for your next appointment:   In Person  Provider:   You may see Kate Sable, MD or one of the following Advanced Practice Providers on your designated Care Team:    Murray Hodgkins, NP  Christell Faith, PA-C  Marrianne Mood, PA-C  Cadence Aiken, [place]      Signed, Kate Sable, MD  11/23/2020 12:27 PM    [place]

## 2020-11-23 NOTE — Patient Instructions (Addendum)
Medication Instructions:  Metoprolol Tartrate  50 mg two times a day  (may spilt 100 mg pills in half)  Lisinopril  10 mg daily  *If you need a refill on your cardiac medications before your next appointment, please call your pharmacy*   Lab Work: None  Testing/Procedures: None  Follow-Up: At [facility], you and your health needs are our priority.  As part of our continuing mission to provide you with exceptional heart care, we have created designated Provider Care Teams.  These Care Teams include your primary Cardiologist (physician) and Advanced Practice Providers (APPs -  Physician Assistants and Nurse Practitioners) who all work together to provide you with the care you need, when you need it.   Your next appointment:   6 month(s)  The format for your next appointment:   In Person  Provider:   You may see Kate Sable, MD or one of the following Advanced Practice Providers on your designated Care Team:    Murray Hodgkins, NP  Christell Faith, PA-C  Marrianne Mood, PA-C  Cadence Calvert, [place]

## 2020-11-25 ENCOUNTER — Other Ambulatory Visit: Payer: Self-pay | Admitting: Urology

## 2020-11-25 DIAGNOSIS — N401 Enlarged prostate with lower urinary tract symptoms: Secondary | ICD-10-CM

## 2020-11-25 DIAGNOSIS — R339 Retention of urine, unspecified: Secondary | ICD-10-CM

## 2020-11-25 DIAGNOSIS — R3914 Feeling of incomplete bladder emptying: Secondary | ICD-10-CM

## 2020-12-08 ENCOUNTER — Telehealth: Payer: Self-pay | Admitting: Family Medicine

## 2020-12-08 ENCOUNTER — Other Ambulatory Visit: Payer: Self-pay

## 2020-12-08 ENCOUNTER — Ambulatory Visit (INDEPENDENT_AMBULATORY_CARE_PROVIDER_SITE_OTHER): Payer: Medicare Other | Admitting: Urology

## 2020-12-08 VITALS — BP 153/108 | HR 76 | Ht 66.0 in | Wt 180.0 lb

## 2020-12-08 DIAGNOSIS — R3914 Feeling of incomplete bladder emptying: Secondary | ICD-10-CM | POA: Diagnosis not present

## 2020-12-08 DIAGNOSIS — R102 Pelvic and perineal pain: Secondary | ICD-10-CM

## 2020-12-08 DIAGNOSIS — N401 Enlarged prostate with lower urinary tract symptoms: Secondary | ICD-10-CM

## 2020-12-08 DIAGNOSIS — I2510 Atherosclerotic heart disease of native coronary artery without angina pectoris: Secondary | ICD-10-CM

## 2020-12-08 LAB — BLADDER SCAN AMB NON-IMAGING: Scan Result: 68

## 2020-12-08 NOTE — Chronic Care Management (AMB) (Signed)
  Chronic Care Management   Note  12/08/2020 Name: [name] MRN: [id number] DOB: 09/23/1950  [name] is a 70 y.o. year old male who is a primary care patient of Ria Bush, MD. I reached out to [name] by phone today in response to a referral sent by Mr. Michael Boston Stallworth's PCP, Ria Bush, MD.   Mr. Cornette was given information about Chronic Care Management services today including:  1. CCM service includes personalized support from designated clinical staff supervised by his physician, including individualized plan of care and coordination with other care providers 2. 24/7 contact phone numbers for assistance for urgent and routine care needs. 3. Service will only be billed when office clinical staff spend 20 minutes or more in a month to coordinate care. 4. Only one practitioner may furnish and bill the service in a calendar month. 5. The patient may stop CCM services at any time (effective at the end of the month) by phone call to the office staff.   Patient agreed to services and verbal consent obtained.   Follow up plan:   Tatjana Secretary/administrator

## 2020-12-08 NOTE — Progress Notes (Signed)
12/08/2020 1:11 PM   [name] 09/09/1950 [id number]  Referring provider: Ria Bush, MD [address] [place],  [place] [address]  Chief Complaint  Patient presents with  . Benign Prostatic Hypertrophy    HPI: 70 year old male with personal history of BPH and chronic incomplete bladder emptying who returns today status post UroLift.  He underwent the procedure on November 02, 2020.  Today, he reports that he has a dull discomfort in his pelvis.  Its not really an ache or soreness.  He denies pressure with palpation.  He denies any dysuria or burning with urination.  He just feels like he has a heaviness which he did not have before.  He is concerned about this.  In terms of urinary symptoms, has had dramatic improvement.  Physic his flow is better and he is emptying better.  He is getting up less at night.  Previous PVRs were in the 300 range and today is less than 100.  He stopped taking his Flomax about a week ago.  He cannot tell the difference off of this medication.  IPSS as below.   IPSS    Row Name 12/08/20 1100         International Prostate Symptom Score   How often have you had the sensation of not emptying your bladder? Not at All     How often have you had to urinate less than every two hours? About half the time     How often have you found you stopped and started again several times when you urinated? Not at All     How often have you found it difficult to postpone urination? Almost always     How often have you had a weak urinary stream? Not at All     How often have you had to strain to start urination? Not at All     How many times did you typically get up at night to urinate? 2 Times     Total IPSS Score 10           Quality of Life due to urinary symptoms   If you were to spend the rest of your life with your urinary condition just the way it is now how would you feel about that? Mostly Satisfied            Score:  1-7  Mild 8-19 Moderate 20-35 Severe    PMH: Past Medical History:  Diagnosis Date  . Allergic rhinitis 07/1998  . Aortic atherosclerosis ([place]) 09/2016   by CT  . Arthritis    BACK AND RIGHT KNEE  . CAD (coronary artery disease) 09/2016   RCA, LAD by CT  . COPD (chronic obstructive pulmonary disease) ([place]) 07/1998   centrilobular emphysema (09/2016) w ongoing tobacco use, spirometry (02/2013)  . ETOH abuse   . Headache    migraine- x1  . Hepatitis    pos test once not now,  Positive   . HTN (hypertension) 05/1998  . Hyperlipidemia 06/1996  . Lumbar spinal stenosis 03/2013   s/p laminectomy  . Persistent atrial fibrillation ([place])   . 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 PVC's (premature ventricular contractions)   . 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 Shortness of breath dyspnea    WITH EXERTION  . Smoker     Surgical History: Past Surgical History:  Procedure Laterality Date  .  ANTERIOR CERVICAL DECOMP/DISCECTOMY FUSION  2003   C3-7  . BACK SURGERY  2007  . CARDIAC CATHETERIZATION N/A 07/01/2015   Procedure: Left Heart Cath and Coronary Angiography;  Surgeon: Wellington Hampshire, MD;  Location: [facility];  Service: Cardiovascular;  Laterality: N/A;  . CARDIOVERSION N/A 05/06/2020   Procedure: CARDIOVERSION;  Surgeon: Kate Sable, MD;  Location: [facility];  Service: Cardiovascular;  Laterality: N/A;  . COLONOSCOPY  09/2010   HPx2, diverticula, hemorrhoids, rpt 10 yrs Benson [place])  . CYSTOSCOPY WITH INSERTION OF UROLIFT N/A 11/02/2020   Procedure: CYSTOSCOPY WITH INSERTION OF UROLIFT;  Surgeon: Hollice Espy, MD;  Location: [facility];  Service: Urology;  Laterality: N/A;  . ETT  2010   normal, ABIs normal  . JOINT REPLACEMENT    . KNEE ARTHROSCOPY Right 11   cartlage, ligaments  . KNEE ARTHROSCOPY Left 84  . LUMBAR LAMINECTOMY  06/2020   Bilateral L2-3 laminectomy [name])  . LUMBAR  LAMINECTOMY/DECOMPRESSION MICRODISCECTOMY Left 03/27/2013   Procedure: LUMBAR LAMINECTOMY/DECOMPRESSION MICRODISCECTOMY 2 LEVELS;  Surgeon: Ophelia Charter, MD;  Location: [facility];  Service: Neurosurgery;  Laterality: Left;  LEFT Lumbar Three Four diskectomy with Lumbar Three-Four, Four-Five Laminectomy. L3/4 ahd L4/5 for LSS [name])   . LUMBAR LAMINECTOMY/DECOMPRESSION MICRODISCECTOMY Bilateral 07/15/2020   Procedure: LAMINECTOMY AND FORAMINOTOMY BILATERAL LUMBAR TWO-LUMBAR THREE;  Surgeon: Newman Pies, MD;  Location: [facility];  Service: Neurosurgery;  Laterality: Bilateral;  . POSTERIOR CERVICAL FUSION/FORAMINOTOMY N/A 05/14/2020   Procedure: LAMINECTOMY, POSTERIOR INSTRUMENTATION AND FUSION, CERVICAL ONE- CERVICAL TWO, CERVICAL TWO- CERVICAL THREE;  Surgeon: Newman Pies, MD;  Location: [facility];  Service: Neurosurgery;  Laterality: N/A;  . SHOULDER ARTHROSCOPY Right 11  . spriometry  02/2013   COPD with some restriction thought to be obesity related ([place])  . TOTAL KNEE ARTHROPLASTY Right 06/27/2014   Procedure: RIGHT TOTAL KNEE ARTHROPLASTY;  Surgeon: Mcarthur Rossetti, MD;  [facility];  Service: Orthopedics;  Laterality: Right;  . US ECHOCARDIOGRAPHY  01/2013   WNL, mild diastolic dysfunction    Home Medications:  Allergies as of 12/08/2020      Reactions   Chantix [varenicline] Other (See Comments)   headaches   Other Other (See Comments)      Medication List       Accurate as of Dec 08, 2020  1:11 PM. If you have any questions, ask your nurse or doctor.        Albuterol Sulfate 108 (90 Base) MCG/ACT Aepb Commonly known as: ProAir RespiClick Inhale 2 puffs into the lungs every 6 (six) hours as needed (cough, shortness of breath).   atorvastatin 10 MG tablet Commonly known as: LIPITOR TAKE 1 TABLET(10 MG) BY MOUTH DAILY   budesonide-formoterol 80-4.5 MCG/ACT inhaler Commonly known as: SYMBICORT Inhale 2 puffs into the lungs 2 (two) times daily as  needed (shortness of breath).   Cholecalciferol 25 MCG (1000 UT) tablet Take 1,000 Units by mouth daily.   Eliquis 5 MG Tabs tablet Generic drug: apixaban Take 5 mg by mouth daily.   Fluticasone-Salmeterol 100-50 MCG/DOSE Aepb Commonly known as: ADVAIR Inhale 1 puff into the lungs daily as needed (Shortness of breath).   lisinopril 10 MG tablet Commonly known as: ZESTRIL Take 1 tablet (10 mg total) by mouth daily.   loratadine 10 MG tablet Commonly known as: CLARITIN Take 10 mg by mouth daily.   metoprolol tartrate 100 MG tablet Commonly known as: LOPRESSOR Take 50 mg by mouth 2 (two) times daily.   OVER  THE COUNTER MEDICATION Take 2 tablets by mouth daily. sulfurzyme   OVER THE COUNTER MEDICATION Take 1,500 mg by mouth daily. BLM Capsules - Young Living Essential Oils 750 mg each   tiotropium 18 MCG inhalation capsule Commonly known as: SPIRIVA Place 18 mcg into inhaler and inhale daily as needed.       Allergies:  Allergies  Allergen Reactions  . Chantix [Varenicline] Other (See Comments)    headaches  . Other Other (See Comments)    Family History: Family History  Problem Relation Age of Onset  . Coronary artery disease Mother   . Hypertension Mother   . Stroke Mother   . Coronary artery disease Father   . Hypertension Father   . Diabetes Father   . Stroke Father   . Pneumonia Sister   . Irregular heart beat Brother   . Maple syrup urine disease Brother   . Cancer Neg Hx     Social History:  reports that he has been smoking cigarettes. He has a 20.00 pack-year smoking history. He has never used smokeless tobacco. He reports current alcohol use of about 28.0 standard drinks of alcohol per week. He reports that he does not use drugs.   Physical Exam: BP (!) 153/108   Pulse 76   Ht 5\' 6"  (1.676 m)   Wt 180 lb (81.6 kg)   BMI 29.05 kg/m   Constitutional:  Alert and oriented, No acute distress. HEENT: [place] AT, moist mucus membranes.  Trachea  midline, no masses. Cardiovascular: No clubbing, cyanosis, or edema. Respiratory: Normal respiratory effort, no increased work of breathing. Skin: No rashes, bruises or suspicious lesions. Neurologic: Grossly intact, no focal deficits, moving all 4 extremities. Psychiatric: Normal mood and affect.  Urine pending  Pertinent Imaging: Results for orders placed or performed in visit on 12/08/20  BLADDER SCAN AMB NON-IMAGING  Result Value Ref Range   Scan Result 68 ml     Assessment & Plan:    1. Benign prostatic hyperplasia with incomplete bladder emptying Dramatic improvement in urinary symptoms including significantly improved bladder emptying today  No longer on BPH medications  We will see him back in 6 months and reassess, return if he worsens or fails to improve  - BLADDER SCAN AMB NON-IMAGING - Urinalysis, Complete  2. Pelvic pain in male Suspect he may have some prostatic inflammation or mild prostatitis  Urinalysis today pending to rule out infection although not suspected due to the absence of any other urinary symptoms  I advised him to take NSAIDs for the next 2 weeks as tolerated to see if we can break the cycle of inflammation.  GI/bleeding precautions were reviewed today in detail, advised to stop if he has any GI distress.   If his symptoms fail to improve, he will return sooner.   Follow-up 6 months for IPSS/PVR  Hollice Espy, MD  [facility] [address] [place], [place] [address] [phone]

## 2020-12-10 LAB — URINALYSIS, COMPLETE
Bilirubin, UA: NEGATIVE
Glucose, UA: NEGATIVE
Ketones, UA: NEGATIVE
Leukocytes,UA: NEGATIVE
Nitrite, UA: NEGATIVE
Protein,UA: NEGATIVE
RBC, UA: NEGATIVE
Specific Gravity, UA: 1.015 (ref 1.005–1.030)
Urobilinogen, Ur: 0.2 mg/dL (ref 0.2–1.0)
pH, UA: 7 (ref 5.0–7.5)

## 2020-12-10 LAB — MICROSCOPIC EXAMINATION
Bacteria, UA: NONE SEEN
RBC, Urine: NONE SEEN /hpf (ref 0–2)

## 2020-12-21 ENCOUNTER — Telehealth: Payer: Self-pay | Admitting: Cardiology

## 2020-12-21 DIAGNOSIS — I4891 Unspecified atrial fibrillation: Secondary | ICD-10-CM | POA: Diagnosis not present

## 2020-12-21 DIAGNOSIS — K625 Hemorrhage of anus and rectum: Secondary | ICD-10-CM | POA: Diagnosis not present

## 2020-12-21 DIAGNOSIS — Z1211 Encounter for screening for malignant neoplasm of colon: Secondary | ICD-10-CM | POA: Diagnosis not present

## 2020-12-21 NOTE — Telephone Encounter (Signed)
Patient with diagnosis of afib on Eliquis for anticoagulation.    Procedure: colonoscopy Date of procedure: 01/07/21  CHA2DS2-VASc Score = 4  This indicates a 4.8% annual risk of stroke. The patient's score is based upon: CHF History: Yes HTN History: Yes Diabetes History: No Stroke History: No Vascular Disease History: Yes Age Score: 1 Gender Score: 0  CrCl 41mL/min using adjusted body weight due to obesity Platelet count 222K  Per office protocol, patient can hold Eliquis for 1-2 days prior to procedure. Would also clarify that pt is taking Eliquis BID - med list states once daily. Looks like this was commented on during last cards visit but med list wasn't updated, pt may need refill as well.

## 2020-12-21 NOTE — Telephone Encounter (Signed)
    [name] DOB:  1951/02/01  MRN:  [id number]   Primary Cardiologist: Kate Sable, MD  Chart reviewed as part of pre-operative protocol coverage. Given past medical history and time since last visit, based on ACC/AHA guidelines, [name] would be at acceptable risk for the planned procedure without further cardiovascular testing.   Patient with diagnosis of afib on Eliquis for anticoagulation.    Procedure: colonoscopy Date of procedure: 01/07/21  CHA2DS2-VASc Score = 4  This indicates a 4.8% annual risk of stroke. The patient's score is based upon: CHF History: Yes HTN History: Yes Diabetes History: No Stroke History: No Vascular Disease History: Yes Age Score: 1 Gender Score: 0  CrCl 73mL/min using adjusted body weight due to obesity Platelet count 222K  Per office protocol, patient can hold Eliquis for 1-2 days prior to procedure. Previously only taking Eliquis once daily however I personally spoke with the patient who confirms he has been taking Eliquis 5mg  BID. Medication list updated.   The patient was advised that if he develops new symptoms prior to surgery to contact our office to arrange for a follow-up visit, and he verbalized understanding.  I will route this recommendation to the requesting party via Epic fax function and remove from pre-op pool.  Please call with questions.  Kathyrn Drown, NP 12/21/2020, 1:52 PM

## 2020-12-21 NOTE — Telephone Encounter (Signed)
   [facility] Pre-operative Risk Assessment    Patient Name: [name]  DOB: October 17, 1950  MRN: [id number]   HEARTCARE STAFF: - Please ensure there is not already an duplicate clearance open for this procedure. - Under Visit Info/Reason for Call, type in Other and utilize the format Clearance MM/DD/YY or Clearance TBD. Do not use dashes or single digits. - If request is for dental extraction, please clarify the # of teeth to be extracted. - If the patient is currently at the dentist's office, call Pre-Op APP to address. If the patient is not currently in the dentist office, please route to the Pre-Op pool  Request for surgical clearance:  1. What type of surgery is being performed? Colonoscopy   2. When is this surgery scheduled? 01/07/21  3. What type of clearance is required (medical clearance vs. Pharmacy clearance to hold med vs. Both)? both  4. Are there any medications that need to be held prior to surgery and how long? Eliquis instructions  5. Practice name and name of physician performing surgery? [facility] - Dr Benson [place]  6. What is the office phone number? [phone]   7.   What is the office fax number? [phone]  8.   Anesthesia type (None, local, MAC, general) ? propofol   Ace Gins 12/21/2020, 12:25 PM  _________________________________________________________________   (provider comments below)

## 2020-12-26 ENCOUNTER — Other Ambulatory Visit: Payer: Self-pay | Admitting: Family Medicine

## 2020-12-26 DIAGNOSIS — E559 Vitamin D deficiency, unspecified: Secondary | ICD-10-CM

## 2020-12-26 DIAGNOSIS — R7303 Prediabetes: Secondary | ICD-10-CM

## 2020-12-26 DIAGNOSIS — N400 Enlarged prostate without lower urinary tract symptoms: Secondary | ICD-10-CM

## 2020-12-26 DIAGNOSIS — E7800 Pure hypercholesterolemia, unspecified: Secondary | ICD-10-CM

## 2020-12-26 DIAGNOSIS — D539 Nutritional anemia, unspecified: Secondary | ICD-10-CM

## 2020-12-29 ENCOUNTER — Other Ambulatory Visit: Payer: Self-pay

## 2020-12-29 ENCOUNTER — Ambulatory Visit (INDEPENDENT_AMBULATORY_CARE_PROVIDER_SITE_OTHER): Payer: Medicare Other

## 2020-12-29 ENCOUNTER — Other Ambulatory Visit (INDEPENDENT_AMBULATORY_CARE_PROVIDER_SITE_OTHER): Payer: Medicare Other

## 2020-12-29 DIAGNOSIS — Z0000 Encounter for general adult medical examination without abnormal findings: Secondary | ICD-10-CM | POA: Diagnosis not present

## 2020-12-29 DIAGNOSIS — E7800 Pure hypercholesterolemia, unspecified: Secondary | ICD-10-CM

## 2020-12-29 DIAGNOSIS — E559 Vitamin D deficiency, unspecified: Secondary | ICD-10-CM | POA: Diagnosis not present

## 2020-12-29 DIAGNOSIS — R7303 Prediabetes: Secondary | ICD-10-CM

## 2020-12-29 LAB — LIPID PANEL
Cholesterol: 155 mg/dL (ref 0–200)
HDL: 83.5 mg/dL (ref >39.00)
LDL Cholesterol: 64 mg/dL (ref 0–99)
NonHDL: 71.33
Total CHOL/HDL Ratio: 2
Triglycerides: 39 mg/dL (ref 0.0–149.0)
VLDL: 7.8 mg/dL (ref 0.0–40.0)

## 2020-12-29 LAB — COMPREHENSIVE METABOLIC PANEL
ALT: 25 U/L (ref 0–53)
AST: 19 U/L (ref 0–37)
Albumin: 4.4 g/dL (ref 3.5–5.2)
Alkaline Phosphatase: 66 U/L (ref 39–117)
BUN: 10 mg/dL (ref 6–23)
CO2: 28 mEq/L (ref 19–32)
Calcium: 9.3 mg/dL (ref 8.4–10.5)
Chloride: 98 mEq/L (ref 96–112)
Creatinine, Ser: 0.85 mg/dL (ref 0.40–1.50)
GFR: 88.66 mL/min (ref >60.00)
Glucose, Bld: 121 mg/dL — ABNORMAL HIGH (ref 70–99)
Potassium: 4.6 mEq/L (ref 3.5–5.1)
Sodium: 133 mEq/L — ABNORMAL LOW (ref 135–145)
Total Bilirubin: 1 mg/dL (ref 0.2–1.2)
Total Protein: 6.8 g/dL (ref 6.0–8.3)

## 2020-12-29 LAB — HEMOGLOBIN A1C: Hgb A1c MFr Bld: 5.9 % (ref 4.6–6.5)

## 2020-12-29 LAB — VITAMIN D 25 HYDROXY (VIT D DEFICIENCY, FRACTURES): VITD: 43.4 ng/mL (ref 30.00–100.00)

## 2020-12-29 NOTE — Patient Instructions (Signed)
[name] , Thank you for taking time to come for your Medicare Wellness Visit. I appreciate your ongoing commitment to your health goals. Please review the following plan we discussed and let me know if I can assist you in the future.   Screening recommendations/referrals: Colonoscopy: scheduled 01/08/2021 Recommended yearly ophthalmology/optometry visit for glaucoma screening and checkup Recommended yearly dental visit for hygiene and checkup  Vaccinations: Influenza vaccine: Up to date, completed 04/21/2020, due 02/2021 Pneumococcal vaccine: Completed series Tdap vaccine: Up to date, completed 04/17/2014, due 04/2024 Shingles vaccine: due, check with your insurance regarding coverage if interested    Covid-19: Completed series  Advanced directives: Please bring a copy of your POA (Power of Attorney) and/or Living Will to your next appointment.   Conditions/risks identified: hypertension, hyperlipidemia   Next appointment: Follow up in one year for your annual wellness visit.   Preventive Care 54 Years and Older, Male Preventive care refers to lifestyle choices and visits with your health care provider that can promote health and wellness. What does preventive care include? A yearly physical exam. This is also called an annual well check. Dental exams once or twice a year. Routine eye exams. Ask your health care provider how often you should have your eyes checked. Personal lifestyle choices, including: Daily care of your teeth and gums. Regular physical activity. Eating a healthy diet. Avoiding tobacco and drug use. Limiting alcohol use. Practicing safe sex. Taking low doses of aspirin every day. Taking vitamin and mineral supplements as recommended by your health care provider. What happens during an annual well check? The services and screenings done by your health care provider during your annual well check will depend on your age, overall health, lifestyle risk factors, and  family history of disease. Counseling  Your health care provider may ask you questions about your: Alcohol use. Tobacco use. Drug use. Emotional well-being. Home and relationship well-being. Sexual activity. Eating habits. History of falls. Memory and ability to understand (cognition). Work and work Statistician. Screening  You may have the following tests or measurements: Height, weight, and BMI. Blood pressure. Lipid and cholesterol levels. These may be checked every 5 years, or more frequently if you are over 60 years old. Skin check. Lung cancer screening. You may have this screening every year starting at age 83 if you have a 30-pack-year history of smoking and currently smoke or have quit within the past 15 years. Fecal occult blood test (FOBT) of the stool. You may have this test every year starting at age 71. Flexible sigmoidoscopy or colonoscopy. You may have a sigmoidoscopy every 5 years or a colonoscopy every 10 years starting at age 47. Prostate cancer screening. Recommendations will vary depending on your family history and other risks. Hepatitis C blood test. Hepatitis B blood test. Sexually transmitted disease (STD) testing. Diabetes screening. This is done by checking your blood sugar (glucose) after you have not eaten for a while (fasting). You may have this done every 1-3 years. Abdominal aortic aneurysm (AAA) screening. You may need this if you are a current or former smoker. Osteoporosis. You may be screened starting at age 59 if you are at high risk. Talk with your health care provider about your test results, treatment options, and if necessary, the need for more tests. Vaccines  Your health care provider may recommend certain vaccines, such as: Influenza vaccine. This is recommended every year. Tetanus, diphtheria, and acellular pertussis (Tdap, Td) vaccine. You may need a Td booster every 10 years. Zoster  vaccine. You may need this after age 26. Pneumococcal  13-valent conjugate (PCV13) vaccine. One dose is recommended after age 74. Pneumococcal polysaccharide (PPSV23) vaccine. One dose is recommended after age 16. Talk to your health care provider about which screenings and vaccines you need and how often you need them. This information is not intended to replace advice given to you by your health care provider. Make sure you discuss any questions you have with your health care provider. Document Released: 07/31/2015 Document Revised: 03/23/2016 Document Reviewed: 05/05/2015 Elsevier Interactive Patient Education  2017 [place] Prevention in the Home Falls can cause injuries. They can happen to people of all ages. There are many things you can do to make your home safe and to help prevent falls. What can I do on the outside of my home? Regularly fix the edges of walkways and driveways and fix any cracks. Remove anything that might make you trip as you walk through a door, such as a raised step or threshold. Trim any bushes or trees on the path to your home. Use bright outdoor lighting. Clear any walking paths of anything that might make someone trip, such as rocks or tools. Regularly check to see if handrails are loose or broken. Make sure that both sides of any steps have handrails. Any raised decks and porches should have guardrails on the edges. Have any leaves, snow, or ice cleared regularly. Use sand or salt on walking paths during winter. Clean up any spills in your garage right away. This includes oil or grease spills. What can I do in the bathroom? Use night lights. Install grab bars by the toilet and in the tub and shower. Do not use towel bars as grab bars. Use non-skid mats or decals in the tub or shower. If you need to sit down in the shower, use a plastic, non-slip stool. Keep the floor dry. Clean up any water that spills on the floor as soon as it happens. Remove soap buildup in the tub or shower regularly. Attach  bath mats securely with double-sided non-slip rug tape. Do not have throw rugs and other things on the floor that can make you trip. What can I do in the bedroom? Use night lights. Make sure that you have a light by your bed that is easy to reach. Do not use any sheets or blankets that are too big for your bed. They should not hang down onto the floor. Have a firm chair that has side arms. You can use this for support while you get dressed. Do not have throw rugs and other things on the floor that can make you trip. What can I do in the kitchen? Clean up any spills right away. Avoid walking on wet floors. Keep items that you use a lot in easy-to-reach places. If you need to reach something above you, use a strong step stool that has a grab bar. Keep electrical cords out of the way. Do not use floor polish or wax that makes floors slippery. If you must use wax, use non-skid floor wax. Do not have throw rugs and other things on the floor that can make you trip. What can I do with my stairs? Do not leave any items on the stairs. Make sure that there are handrails on both sides of the stairs and use them. Fix handrails that are broken or loose. Make sure that handrails are as long as the stairways. Check any carpeting to make sure that it  is firmly attached to the stairs. Fix any carpet that is loose or worn. Avoid having throw rugs at the top or bottom of the stairs. If you do have throw rugs, attach them to the floor with carpet tape. Make sure that you have a light switch at the top of the stairs and the bottom of the stairs. If you do not have them, ask someone to add them for you. What else can I do to help prevent falls? Wear shoes that: Do not have high heels. Have rubber bottoms. Are comfortable and fit you well. Are closed at the toe. Do not wear sandals. If you use a stepladder: Make sure that it is fully opened. Do not climb a closed stepladder. Make sure that both sides of the  stepladder are locked into place. Ask someone to hold it for you, if possible. Clearly mark and make sure that you can see: Any grab bars or handrails. First and last steps. Where the edge of each step is. Use tools that help you move around (mobility aids) if they are needed. These include: Canes. Walkers. Scooters. Crutches. Turn on the lights when you go into a dark area. Replace any light bulbs as soon as they burn out. Set up your furniture so you have a clear path. Avoid moving your furniture around. If any of your floors are uneven, fix them. If there are any pets around you, be aware of where they are. Review your medicines with your doctor. Some medicines can make you feel dizzy. This can increase your chance of falling. Ask your doctor what other things that you can do to help prevent falls. This information is not intended to replace advice given to you by your health care provider. Make sure you discuss any questions you have with your health care provider. Document Released: 04/30/2009 Document Revised: 12/10/2015 Document Reviewed: 08/08/2014 Elsevier Interactive Patient Education  2017 [employer].

## 2020-12-29 NOTE — Progress Notes (Signed)
PCP notes:  Health Maintenance: Shingrix- due Colonoscopy- scheduled 01/08/2021   Abnormal Screenings: none   Patient concerns: none   Nurse concerns: none   Next PCP appt.: 01/05/2021 @ 10 am

## 2020-12-29 NOTE — Progress Notes (Signed)
Subjective:   [name] is a 70 y.o. male who presents for Medicare Annual/Subsequent preventive examination.  Review of Systems: N/A      I connected with the patient today by telephone and verified that I am speaking with the correct person using two identifiers. Location patient: home Location nurse: work Persons participating in the telephone visit: patient, nurse.   I discussed the limitations, risks, security and privacy concerns of performing an evaluation and management service by telephone and the availability of in person appointments. I also discussed with the patient that there may be a patient responsible charge related to this service. The patient expressed understanding and verbally consented to this telephonic visit.        Cardiac Risk Factors include: advanced age (>32men, >74 women);male gender;hypertension;Other (see comment), Risk factor comments: hyperlipidemia     Objective:    Today's Vitals   12/29/20 1021  PainSc: 0-No pain   There is no height or weight on file to calculate BMI.  Advanced Directives 12/29/2020 11/02/2020 10/26/2020 07/07/2020 05/14/2020 05/11/2020 10/02/2019  Does Patient Have a Medical Advance Directive? Yes Yes Yes Yes Yes Yes Yes  Type of Paramedic of [place];Living will Healthcare Power of [place] Living will [place];Living will [place];Living will [place];Living will [place];Living will  Does patient want to make changes to medical advance directive? - No - Guardian declined - No - Patient declined No - Patient declined - -  Copy of [place] in Chart? No - copy requested - - No - copy requested No - copy requested No - copy requested No - copy requested  Would patient like information on creating a medical advance directive? - No - Patient declined - - - - -  Pre-existing out of facility DNR order  (yellow form or pink MOST form) - - - - - - -    Current Medications (verified) Outpatient Encounter Medications as of 12/29/2020  Medication Sig   Albuterol Sulfate (PROAIR RESPICLICK) 628 (90 Base) MCG/ACT AEPB Inhale 2 puffs into the lungs every 6 (six) hours as needed (cough, shortness of breath).   apixaban (ELIQUIS) 5 MG TABS tablet Take 5 mg by mouth 2 (two) times daily.   atorvastatin (LIPITOR) 10 MG tablet TAKE 1 TABLET(10 MG) BY MOUTH DAILY   budesonide-formoterol (SYMBICORT) 80-4.5 MCG/ACT inhaler Inhale 2 puffs into the lungs 2 (two) times daily as needed (shortness of breath).   Cholecalciferol 25 MCG (1000 UT) tablet Take 1,000 Units by mouth daily.   Fluticasone-Salmeterol (ADVAIR) 100-50 MCG/DOSE AEPB Inhale 1 puff into the lungs daily as needed (Shortness of breath).   lisinopril (ZESTRIL) 10 MG tablet Take 1 tablet (10 mg total) by mouth daily.   loratadine (CLARITIN) 10 MG tablet Take 10 mg by mouth daily.   metoprolol tartrate (LOPRESSOR) 100 MG tablet Take 50 mg by mouth 2 (two) times daily.   OVER THE COUNTER MEDICATION Take 2 tablets by mouth daily. sulfurzyme   OVER THE COUNTER MEDICATION Take 1,500 mg by mouth daily. BLM Capsules - [place] Essential Oils 750 mg each   tiotropium (SPIRIVA) 18 MCG inhalation capsule Place 18 mcg into inhaler and inhale daily as needed.   No facility-administered encounter medications on file as of 12/29/2020.    Allergies (verified) Chantix [varenicline] and Other   History: Past Medical History:  Diagnosis Date   Allergic rhinitis 07/1998   Aortic atherosclerosis (  [place]) 09/2016   by CT   Arthritis    BACK AND RIGHT KNEE   CAD (coronary artery disease) 09/2016   RCA, LAD by CT   COPD (chronic obstructive pulmonary disease) ([place]) 07/1998   centrilobular emphysema (09/2016) w ongoing tobacco use, spirometry (02/2013)   ETOH abuse    Headache    migraine- x1   Hepatitis    pos test once not now,  Positive    HTN  (hypertension) 05/1998   Hyperlipidemia 06/1996   Lumbar spinal stenosis 03/2013   s/p laminectomy   Persistent atrial fibrillation (H[facility])    Positive hepatitis C antibody test 08/2015   neg viral load - likely cleared infection   PVC's (premature ventricular contractions)    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   Shortness of breath dyspnea    WITH EXERTION   Smoker    Past Surgical History:  Procedure Laterality Date   ANTERIOR CERVICAL DECOMP/DISCECTOMY FUSION  2003   C3-7   BACK SURGERY  2007   CARDIAC CATHETERIZATION N/A 07/01/2015   Procedure: Left Heart Cath and Coronary Angiography;  Surgeon: Wellington Hampshire, MD;  Location: [facility];  Service: Cardiovascular;  Laterality: N/A;   CARDIOVERSION N/A 05/06/2020   Procedure: CARDIOVERSION;  Surgeon: Kate Sable, MD;  Location: [facility];  Service: Cardiovascular;  Laterality: N/A;   COLONOSCOPY  09/2010   HPx2, diverticula, hemorrhoids, rpt 10 yrs ([place])   [place] N/A 11/02/2020   Procedure: CYSTOSCOPY WITH INSERTION OF UROLIFT;  Surgeon: Hollice Espy, MD;  Location: [facility];  Service: Urology;  Laterality: N/A;   ETT  2010   normal, ABIs normal   JOINT REPLACEMENT     KNEE ARTHROSCOPY Right 11   [place], ligaments   KNEE ARTHROSCOPY Left 84   LUMBAR LAMINECTOMY  06/2020   Bilateral L2-3 laminectomy [name])   LUMBAR LAMINECTOMY/DECOMPRESSION MICRODISCECTOMY Left 03/27/2013   Procedure: LUMBAR LAMINECTOMY/DECOMPRESSION MICRODISCECTOMY 2 LEVELS;  Surgeon: Ophelia Charter, MD;  Location: [place] [facility];  Service: Neurosurgery;  Laterality: Left;  LEFT Lumbar Three Four diskectomy with Lumbar Three-Four, Four-Five Laminectomy. L3/4 ahd L4/5 for LSS [name])    LUMBAR LAMINECTOMY/DECOMPRESSION MICRODISCECTOMY Bilateral 07/15/2020   Procedure: LAMINECTOMY AND FORAMINOTOMY BILATERAL LUMBAR TWO-LUMBAR THREE;  Surgeon: Newman Pies, MD;  Location: [place];  Service:  Neurosurgery;  Laterality: Bilateral;   POSTERIOR CERVICAL FUSION/FORAMINOTOMY N/A 05/14/2020   Procedure: LAMINECTOMY, POSTERIOR INSTRUMENTATION AND FUSION, CERVICAL ONE- CERVICAL TWO, CERVICAL TWO- CERVICAL THREE;  Surgeon: Newman Pies, MD;  Location: [place];  Service: Neurosurgery;  Laterality: N/A;   SHOULDER ARTHROSCOPY Right 11   spriometry  02/2013   COPD with some restriction thought to be obesity related ([place])   TOTAL KNEE ARTHROPLASTY Right 06/27/2014   Procedure: RIGHT TOTAL KNEE ARTHROPLASTY;  Surgeon: Mcarthur Rossetti, MD;  Location: [facility];  Service: Orthopedics;  Laterality: Right;   US ECHOCARDIOGRAPHY  01/2013   WNL, mild diastolic dysfunction   Family History  Problem Relation Age of Onset   Coronary artery disease Mother    Hypertension Mother    Stroke Mother    Coronary artery disease Father    Hypertension Father    Diabetes Father    Stroke Father    Pneumonia Sister    Irregular heart beat Brother    Maple syrup urine disease Brother    Cancer Neg Hx    Social History   Socioeconomic History   Marital status: Married  Spouse name: Not on file   Number of children: Not on file   Years of education: Not on file   Highest education level: Not on file  Occupational History   Not on file  Tobacco Use   Smoking status: Every Day    Packs/day: 0.50    Years: 40.00    Pack years: 20.00    Types: Cigarettes   Smokeless tobacco: Never  Vaping Use   Vaping Use: Never used  Substance and Sexual Activity   Alcohol use: Yes    Alcohol/week: 28.0 standard drinks    Types: 28 Cans of beer per week    Comment: 3-4 beers daily   Drug use: No   Sexual activity: Not Currently  Other Topics Concern   Not on file  Social History Narrative   Lives with wife. Has 1 son, 2 daughters.    Occupation: Psychologist, clinical.    Activity: no exercise   Diet: good water, fruits/vegetables daily      Screened positive for OSA at recent hospitalization  (03/2013)   Social Determinants of Health   Financial Resource Strain: Low Risk    Difficulty of Paying Living Expenses: Not hard at all  Food Insecurity: No Food Insecurity   Worried About Charity fundraiser in the Last Year: Never true   Ran Out of Food in the Last Year: Never true  Transportation Needs: No Transportation Needs   Lack of Transportation (Medical): No   Lack of Transportation (Non-Medical): No  Physical Activity: Inactive   Days of Exercise per Week: 0 days   Minutes of Exercise per Session: 0 min  Stress: No Stress Concern Present   Feeling of Stress : Not at all  Social Connections: Not on file    Tobacco Counseling Ready to quit: Not Answered Counseling given: Not Answered   Clinical Intake:  Pre-visit preparation completed: Yes  Pain : No/denies pain Pain Score: 0-No pain     Nutritional Risks: None Diabetes: No  How often do you need to have someone help you when you read instructions, pamphlets, or other written materials from your doctor or pharmacy?: 1 - Never  Diabetic: No Nutrition Risk Assessment:  Has the patient had any N/V/D within the last 2 months?  No  Does the patient have any non-healing wounds?  No  Has the patient had any unintentional weight loss or weight gain?  No   Diabetes:  Is the patient diabetic?  No  If diabetic, was a CBG obtained today?   N/A Did the patient bring in their glucometer from home?   N/A How often do you monitor your CBG's? N/A.   Financial Strains and Diabetes Management:  Are you having any financial strains with the device, your supplies or your medication?  N/A .  Does the patient want to be seen by Chronic Care Management for management of their diabetes?   N/A Would the patient like to be referred to a Nutritionist or for Diabetic Management?   N/A  Interpreter Needed?: No  Information entered by :: CJohnson, LPN   Activities of Daily Living In your present state of health, do you have  any difficulty performing the following activities: 12/29/2020 10/26/2020  Hearing? N N  Vision? N Y  Difficulty concentrating or making decisions? N N  Walking or climbing stairs? N N  Comment - -  Dressing or bathing? N N  Doing errands, shopping? N N  Preparing Food and eating ? N -  Using the Toilet? N -  In the past six months, have you accidently leaked urine? N -  Do you have problems with loss of bowel control? N -  Managing your Medications? N -  Managing your Finances? N -  Housekeeping or managing your Housekeeping? N -  Some recent data might be hidden    Patient Care Team: Ria Bush, MD as PCP - General (Family Medicine) Kate Sable, MD as PCP - Cardiology (Cardiology) Debbora Dus, [facility] as Pharmacist (Pharmacist)  Indicate any recent Medical Services you may have received from other than [facility] providers in the past year (date may be approximate).     Assessment:   This is a routine wellness examination for [place].  Hearing/Vision screen Vision Screening - Comments:: Patient gets annual eye exams   Dietary issues and exercise activities discussed: Current Exercise Habits: The patient does not participate in regular exercise at present, Exercise limited by: orthopedic condition(s)   Goals Addressed             This Visit's Progress    Patient Stated       12/29/2020, I will maintain and continue medications as prescribed.        Depression Screen PHQ 2/9 Scores 12/29/2020 10/02/2019 09/12/2018 09/11/2017 09/09/2016  PHQ - 2 Score 0 0 0 0 0  PHQ- 9 Score 0 0 0 - -    Fall Risk Fall Risk  12/29/2020 10/02/2019 09/12/2018 09/11/2017 09/09/2016  Falls in the past year? 0 0 0 No No  Number falls in past yr: 0 0 - - -  Injury with Fall? 0 0 - - -  Risk for fall due to : Medication side effect Medication side effect - - -  Follow up Falls evaluation completed;Falls prevention discussed Falls prevention discussed;Falls evaluation completed - - -     FALL RISK PREVENTION PERTAINING TO THE HOME:  Any stairs in or around the home? Yes  If so, are there any without handrails? No  Home free of loose throw rugs in walkways, pet beds, electrical cords, etc? Yes  Adequate lighting in your home to reduce risk of falls? Yes   ASSISTIVE DEVICES UTILIZED TO PREVENT FALLS:  Life alert? No  Use of a cane, walker or w/c? No  Grab bars in the bathroom? No  Shower chair or bench in shower? No  Elevated toilet seat or a handicapped toilet? No   TIMED UP AND GO:  Was the test performed?  N/A telephone visit  .   Cognitive Function: MMSE - Mini Mental State Exam 12/29/2020 10/02/2019 09/12/2018  Not completed: Refused - -  Orientation to time - 5 5  Orientation to Place - 5 5  Registration - 3 3  Attention/ Calculation - 5 0  Recall - 2 3  Language- name 2 objects - - 0  Language- repeat - 1 1  Language- follow 3 step command - - 2  Language- follow 3 step command-comments - - unable to follow 1 step of 3 step command  Language- read & follow direction - - 0  Write a sentence - - 0  Copy design - - 0  Total score - - 19  Mini Cog  Mini-Cog screen was not completed. Patient refused. Maximum score is 22. A value of 0 denotes this part of the MMSE was not completed or the patient failed this part of the Mini-Cog screening.       Immunizations Immunization History  Administered Date(s) Administered  Fluad Quad(high Dose 65+) 04/23/2019, 04/21/2020   Influenza Split 04/17/2017   Influenza Whole 05/18/2005   Influenza, High Dose Seasonal PF 04/26/2018   Influenza,inj,Quad PF,6+ Mos 04/16/2014, 04/14/2015, 04/15/2016, 05/05/2017   Influenza,trivalent, recombinat, inj, PF 04/10/2018   Influenza-Unspecified 04/18/2019, 04/17/2020   Moderna Sars-Covid-2 Vaccination 10/23/2020   PFIZER(Purple Top)SARS-COV-2 Vaccination 08/24/2019, 09/17/2019   Pneumococcal Conjugate-13 09/09/2016, 04/10/2018   Pneumococcal Polysaccharide-23  08/26/2013, 09/12/2018   Td 10/18/2002   Tdap 08/24/2012, 04/17/2014   Zoster, Live 10/03/2013    TDAP status: Up to date  Flu Vaccine status: Up to date  Pneumococcal vaccine status: Up to date  Covid-19 vaccine status: Completed vaccines  Qualifies for Shingles Vaccine? Yes   Zostavax completed Yes   Shingrix Completed?: No.    Education has been provided regarding the importance of this vaccine. Patient has been advised to call insurance company to determine out of pocket expense if they have not yet received this vaccine. Advised may also receive vaccine at local pharmacy or Health Dept. Verbalized acceptance and understanding.  Screening Tests Health Maintenance  Topic Date Due   Zoster Vaccines- Shingrix (1 of 2) Never done   COLON CANCER SCREENING ANNUAL FOBT  09/20/2019   INFLUENZA VACCINE  02/15/2021   COVID-19 Vaccine (4 - Booster for Pfizer series) 02/22/2021   TETANUS/TDAP  04/17/2024   Hepatitis C Screening  Completed   PNA vac Low Risk Adult  Completed   HPV VACCINES  Aged Out   COLONOSCOPY (Pts 45-25yrs Insurance coverage will need to be confirmed)  Discontinued    Health Maintenance  Health Maintenance Due  Topic Date Due   Zoster Vaccines- Shingrix (1 of 2) Never done   COLON CANCER SCREENING ANNUAL FOBT  09/20/2019    Colorectal cancer screening: scheduled 01/08/2021 with Dr. Benson Norway per patient   Lung Cancer Screening: (Low Dose CT Chest recommended if Age 73-80 years, 30 pack-year currently smoking OR have quit w/in 15years.) does qualify.   Lung Cancer Screening Referral: completed 02/06/2020  Additional Screening:  Hepatitis C Screening: does qualify; Completed 03/07/2016  Vision Screening: Recommended annual ophthalmology exams for early detection of glaucoma and other disorders of the eye. Is the patient up to date with their annual eye exam?  Yes  Who is the provider or what is the name of the office in which the patient attends annual eye exams?  [facility] If pt is not established with a provider, would they like to be referred to a provider to establish care? No .   Dental Screening: Recommended annual dental exams for proper oral hygiene  Community Resource Referral / Chronic Care Management: CRR required this visit?  Yes   CCM required this visit?  No      Plan:     I have personally reviewed and noted the following in the patient's chart:   Medical and social history Use of alcohol, tobacco or illicit drugs  Current medications and supplements including opioid prescriptions. Patient is not currently taking opioid prescriptions. Functional ability and status Nutritional status Physical activity Advanced directives List of other physicians Hospitalizations, surgeries, and ER visits in previous 12 months Vitals Screenings to include cognitive, depression, and falls Referrals and appointments  In addition, I have reviewed and discussed with patient certain preventive protocols, quality metrics, and best practice recommendations. A written personalized care plan for preventive services as well as general preventive health recommendations were provided to patient.   Due to this being a telephonic visit, the after visit  summary with patients personalized plan was offered to patient via office or my-chart. Patient preferred to pick up at office at next visit or via mychart.   Andrez Grime, LPN   [id number]

## 2020-12-30 ENCOUNTER — Ambulatory Visit: Payer: Medicare Other | Admitting: Cardiology

## 2021-01-05 ENCOUNTER — Other Ambulatory Visit: Payer: Self-pay

## 2021-01-05 ENCOUNTER — Ambulatory Visit (INDEPENDENT_AMBULATORY_CARE_PROVIDER_SITE_OTHER): Payer: Medicare Other | Admitting: Family Medicine

## 2021-01-05 ENCOUNTER — Encounter: Payer: Self-pay | Admitting: Family Medicine

## 2021-01-05 VITALS — BP 160/84 | HR 61 | Temp 97.7°F | Ht 64.75 in | Wt 186.4 lb

## 2021-01-05 DIAGNOSIS — E7800 Pure hypercholesterolemia, unspecified: Secondary | ICD-10-CM

## 2021-01-05 DIAGNOSIS — E559 Vitamin D deficiency, unspecified: Secondary | ICD-10-CM | POA: Diagnosis not present

## 2021-01-05 DIAGNOSIS — J449 Chronic obstructive pulmonary disease, unspecified: Secondary | ICD-10-CM | POA: Diagnosis not present

## 2021-01-05 DIAGNOSIS — R7303 Prediabetes: Secondary | ICD-10-CM

## 2021-01-05 DIAGNOSIS — I700 Atherosclerosis of aorta: Secondary | ICD-10-CM

## 2021-01-05 DIAGNOSIS — M79604 Pain in right leg: Secondary | ICD-10-CM

## 2021-01-05 DIAGNOSIS — N400 Enlarged prostate without lower urinary tract symptoms: Secondary | ICD-10-CM

## 2021-01-05 DIAGNOSIS — I10 Essential (primary) hypertension: Secondary | ICD-10-CM | POA: Diagnosis not present

## 2021-01-05 DIAGNOSIS — E66811 Obesity, class 1: Secondary | ICD-10-CM

## 2021-01-05 DIAGNOSIS — M4802 Spinal stenosis, cervical region: Secondary | ICD-10-CM

## 2021-01-05 DIAGNOSIS — G992 Myelopathy in diseases classified elsewhere: Secondary | ICD-10-CM

## 2021-01-05 DIAGNOSIS — I4819 Other persistent atrial fibrillation: Secondary | ICD-10-CM

## 2021-01-05 DIAGNOSIS — Z8619 Personal history of other infectious and parasitic diseases: Secondary | ICD-10-CM | POA: Diagnosis not present

## 2021-01-05 DIAGNOSIS — M5416 Radiculopathy, lumbar region: Secondary | ICD-10-CM

## 2021-01-05 DIAGNOSIS — Z720 Tobacco use: Secondary | ICD-10-CM | POA: Diagnosis not present

## 2021-01-05 DIAGNOSIS — F1090 Alcohol use, unspecified, uncomplicated: Secondary | ICD-10-CM

## 2021-01-05 DIAGNOSIS — I2510 Atherosclerotic heart disease of native coronary artery without angina pectoris: Secondary | ICD-10-CM

## 2021-01-05 DIAGNOSIS — E669 Obesity, unspecified: Secondary | ICD-10-CM | POA: Diagnosis not present

## 2021-01-05 DIAGNOSIS — Z7189 Other specified counseling: Secondary | ICD-10-CM | POA: Diagnosis not present

## 2021-01-05 DIAGNOSIS — Z7289 Other problems related to lifestyle: Secondary | ICD-10-CM

## 2021-01-05 NOTE — Assessment & Plan Note (Signed)
S/p cervical neck surgery 04/2020 [name])

## 2021-01-05 NOTE — Assessment & Plan Note (Signed)
Chronic, stable on low dose atorvastatin - continue. The 10-year ASCVD risk score [name] [name]., et al., 2013) is: 41.2%   Values used to calculate the score:     Age: 70 years     Sex: Male     Is Non-Hispanic African American: No     Diabetic: Yes     Tobacco smoker: Yes     Systolic Blood Pressure: 984 mmHg     Is BP treated: Yes     HDL Cholesterol: 83.5 mg/dL     Total Cholesterol: 155 mg/dL

## 2021-01-05 NOTE — Assessment & Plan Note (Addendum)
Chronic, continued smoker. On respiratory medications spiriva and symbicort Encouraged full smoking cessation.

## 2021-01-05 NOTE — Assessment & Plan Note (Signed)
Hx, resolved. LFTs normal.

## 2021-01-05 NOTE — Assessment & Plan Note (Signed)
Reviewed recent A1c with patient, encouraged limiting added sugars in diet.

## 2021-01-05 NOTE — Assessment & Plan Note (Signed)
Advanced directives: has at home. HCPOA is daughter, [name]. Will bring [place] copy. Has decided to decline bringing copy of living will for our records

## 2021-01-05 NOTE — Assessment & Plan Note (Signed)
Latest laminectomy L2/3 (06/2020)

## 2021-01-05 NOTE — Assessment & Plan Note (Signed)
Followed by cardiology, continue eliquis.

## 2021-01-05 NOTE — Assessment & Plan Note (Signed)
Reviewed alcohol use, encouraged continued taper.

## 2021-01-05 NOTE — Progress Notes (Signed)
Patient ID: [name], male    DOB: 1951-01-27, 70 y.o.   MRN: [id number]  This visit was conducted in person.  BP (!) 160/84   Pulse 61   Temp 97.7 F (36.5 C) (Temporal)   Ht 5' 4.75" (1.645 m)   Wt 186 lb 6 oz (84.5 kg)   SpO2 97%   BMI 31.25 kg/m   BP Readings from Last 3 Encounters:  01/05/21 (!) 160/84  12/08/20 (!) 153/108  11/23/20 (!) 170/96   CC: AMW f/u visit  Subjective:   HPI: [name] is a 70 y.o. male presenting on 01/05/2021 for Annual Exam (Prt 2. )   Saw health advisor last week for medicare wellness visit. Note reviewed.    No results found.  Flowsheet Row Clinical Support from 12/29/2020 in [facility] at [place]  PHQ-2 Total Score 0       Fall Risk  12/29/2020 10/02/2019 09/12/2018 09/11/2017 09/09/2016  Falls in the past year? 0 0 0 No No  Number falls in past yr: 0 0 - - -  Injury with Fall? 0 0 - - -  Risk for fall due to : Medication side effect Medication side effect - - -  Follow up Falls evaluation completed;Falls prevention discussed Falls prevention discussed;Falls evaluation completed - - -    Persistent afib, failed DCCV 04/2020, now on eliquis and metoprolol bid.  Recent UroLift 10/2020 [name]) with significant improvement in obstructive symptoms. Notes some lower abdominal pressure sensation as well as increased gas since then.   Multiple recent back procedures including posterior cervical disc fusion C1-2-3 04/2020 as well as bilateral L2/3 laminectomy/foraminotomy 06/2020 [name]). Notes ongoing sharp R thigh pain which is what started process in the first place - has decided to "live with it".   Home BP readings well controled - 110-120/70s.  [facility].   Preventative: Colonoscopy 09/23/2010 - small HPs, diverticula, hemorrhoids. Rpt 10 yrs [facility] [place]). iFOB normal 2020. upcoming colonoscopy scheduled for this week  Prostate - normal DRE/PSA in past.  Known BPH with nocturia, recently improved.   Lung cancer screening - continues yearly screening CT since 2018.  AAA screen - poor study, no AAA noted (09/2016).  Flu shot yearly  [place] 08/2019, 09/2019, Moderna booster 10/2020  Pneumovax - 2015, 2020, prevnar 2018 Tdap 2014 zostavax - 09/2013 shingrix - discussed, interested  Advanced directives: has at home. HCPOA is daughter, [name]. Will bring [place] copy. Has decided to decline bringing copy of living will for our records  Seat belt use discussed. Sunscreen use discussed. No changing moles on skin. Smoking 1/2 ppd. Sparingly uses nicotrol inhaler  Alcohol - 5-7 beers/day - this helps his chronic pain  Dentist q6 mo  Eye exam q2 yrs  Bowel - no diarrhea/constipation  Bladder - no incontinence    Lives with wife. Has 1 son, 2 daughters.   Occupation: Psychologist, clinical. Activity: no regular exercise.  Diet: no water, fruits/vegetables daily      Relevant past medical, surgical, family and social history reviewed and updated as indicated. Interim medical history since our last visit reviewed. Allergies and medications reviewed and updated. Outpatient Medications Prior to Visit  Medication Sig Dispense Refill   Albuterol Sulfate (PROAIR RESPICLICK) 875 (90 Base) MCG/ACT AEPB Inhale 2 puffs into the lungs every 6 (six) hours as needed (cough, shortness of breath). 1 each 3   apixaban (ELIQUIS) 5 MG TABS tablet Take 5 mg by mouth 2 (  two) times daily.     atorvastatin (LIPITOR) 10 MG tablet TAKE 1 TABLET(10 MG) BY MOUTH DAILY 90 tablet 2   budesonide-formoterol (SYMBICORT) 80-4.5 MCG/ACT inhaler Inhale 2 puffs into the lungs 2 (two) times daily as needed (shortness of breath).     Cholecalciferol 25 MCG (1000 UT) tablet Take 1,000 Units by mouth daily.     Fluticasone-Salmeterol (ADVAIR) 100-50 MCG/DOSE AEPB Inhale 1 puff into the lungs daily as needed (Shortness of breath).     lisinopril (ZESTRIL) 10 MG tablet Take 1 tablet (10 mg total) by mouth daily. 90 tablet 3    loratadine (CLARITIN) 10 MG tablet Take 10 mg by mouth daily.     metoprolol tartrate (LOPRESSOR) 100 MG tablet Take 50 mg by mouth 2 (two) times daily.     OVER THE COUNTER MEDICATION Take 2 tablets by mouth daily. sulfurzyme     OVER THE COUNTER MEDICATION Take 1,500 mg by mouth daily. BLM Capsules - Young Living Essential Oils 750 mg each     tiotropium (SPIRIVA) 18 MCG inhalation capsule Place 18 mcg into inhaler and inhale daily as needed.     No facility-administered medications prior to visit.     Per HPI unless specifically indicated in ROS section below Review of Systems  Constitutional:  Negative for activity change, appetite change, chills, fatigue, fever and unexpected weight change.  HENT:  Negative for hearing loss.   Eyes:  Negative for visual disturbance.  Respiratory:  Positive for cough (in smoker) and wheezing. Negative for chest tightness and shortness of breath.   Cardiovascular:  Negative for chest pain, palpitations and leg swelling.  Gastrointestinal:  Positive for blood in stool (with wiping). Negative for abdominal distention, abdominal pain, constipation, diarrhea, nausea and vomiting.  Genitourinary:  Negative for difficulty urinating and hematuria.  Musculoskeletal:  Negative for arthralgias, myalgias and neck pain.  Skin:  Negative for rash.  Neurological:  Negative for dizziness, seizures, syncope and headaches.  Hematological:  Negative for adenopathy. Does not bruise/bleed easily.  Psychiatric/Behavioral:  Negative for dysphoric mood. The patient is not nervous/anxious.   Objective:  BP (!) 160/84   Pulse 61   Temp 97.7 F (36.5 C) (Temporal)   Ht 5' 4.75" (1.645 m)   Wt 186 lb 6 oz (84.5 kg)   SpO2 97%   BMI 31.25 kg/m   Wt Readings from Last 3 Encounters:  01/05/21 186 lb 6 oz (84.5 kg)  12/08/20 180 lb (81.6 kg)  11/23/20 189 lb (85.7 kg)      Physical Exam Vitals and nursing note reviewed.  Constitutional:      General: He is not in  acute distress.    Appearance: Normal appearance. He is well-developed. He is not ill-appearing.  HENT:     Head: Normocephalic and atraumatic.     Right Ear: Hearing, tympanic membrane, ear canal and external ear normal.     Left Ear: Hearing, tympanic membrane, ear canal and external ear normal.  Eyes:     General: No scleral icterus.    Extraocular Movements: Extraocular movements intact.     Conjunctiva/sclera: Conjunctivae normal.     Pupils: Pupils are equal, round, and reactive to light.  Neck:     Thyroid: No thyroid mass or thyromegaly.  Cardiovascular:     Rate and Rhythm: Normal rate and regular rhythm.     Pulses: Normal pulses.          Radial pulses are 2+ on the right side and  2+ on the left side.     Heart sounds: Normal heart sounds. No murmur heard. Pulmonary:     Effort: Pulmonary effort is normal. No respiratory distress.     Breath sounds: Normal breath sounds. No wheezing, rhonchi or rales.  Abdominal:     General: Bowel sounds are increased. There is no distension.     Palpations: Abdomen is soft. There is no mass.     Tenderness: There is no abdominal tenderness. There is no guarding or rebound.     Hernia: No hernia is present.  Musculoskeletal:        General: Normal range of motion.     Cervical back: Normal range of motion and neck supple.     Right lower leg: No edema.     Left lower leg: No edema.  Lymphadenopathy:     Cervical: No cervical adenopathy.  Skin:    General: Skin is warm and dry.     Findings: No rash.  Neurological:     General: No focal deficit present.     Mental Status: He is alert and oriented to person, place, and time.  Psychiatric:        Mood and Affect: Mood normal.        Behavior: Behavior normal.        Thought Content: Thought content normal.        Judgment: Judgment normal.      Results for orders placed or performed in visit on 12/29/20  VITAMIN D 25 Hydroxy (Vit-D Deficiency, Fractures)  Result Value Ref  Range   VITD 43.40 30.00 - 100.00 ng/mL  Hemoglobin A1c  Result Value Ref Range   Hgb A1c MFr Bld 5.9 4.6 - 6.5 %  Comprehensive metabolic panel  Result Value Ref Range   Sodium 133 (L) 135 - 145 mEq/L   Potassium 4.6 3.5 - 5.1 mEq/L   Chloride 98 96 - 112 mEq/L   CO2 28 19 - 32 mEq/L   Glucose, Bld 121 (H) 70 - 99 mg/dL   BUN 10 6 - 23 mg/dL   Creatinine, Ser 0.85 0.40 - 1.50 mg/dL   Total Bilirubin 1.0 0.2 - 1.2 mg/dL   Alkaline Phosphatase 66 39 - 117 U/L   AST 19 0 - 37 U/L   ALT 25 0 - 53 U/L   Total Protein 6.8 6.0 - 8.3 g/dL   Albumin 4.4 3.5 - 5.2 g/dL   GFR 88.66 >60.00 mL/min   Calcium 9.3 8.4 - 10.5 mg/dL  Lipid panel  Result Value Ref Range   Cholesterol 155 0 - 200 mg/dL   Triglycerides 39.0 0.0 - 149.0 mg/dL   HDL 83.50 >39.00 mg/dL   VLDL 7.8 0.0 - 40.0 mg/dL   LDL Cholesterol 64 0 - 99 mg/dL   Total CHOL/HDL Ratio 2    NonHDL 71.33    Lab Results  Component Value Date   PSA 1.12 07/02/2020   PSA 2.260 03/17/2020   PSA 1.81 09/30/2019   Lab Results  Component Value Date   WBC 5.1 10/26/2020   HGB 13.0 10/26/2020   HCT 37.2 (L) 10/26/2020   MCV 95.9 10/26/2020   PLT 222 10/26/2020    Assessment & Plan:  This visit occurred during the SARS-CoV-2 public health emergency.  Safety protocols were in place, including screening questions prior to the visit, additional usage of staff PPE, and extensive cleaning of exam room while observing appropriate contact time as indicated for disinfecting solutions.  Problem List Items Addressed This Visit     HLD (hyperlipidemia)    Chronic, stable on low dose atorvastatin - continue. The 10-year ASCVD risk score Mikey Bussing DC Brooke Bonito., et al., 2013) is: 41.2%   Values used to calculate the score:     Age: 53 years     Sex: Male     Is Non-Hispanic African American: No     Diabetic: Yes     Tobacco smoker: Yes     Systolic Blood Pressure: 297 mmHg     Is BP treated: Yes     HDL Cholesterol: 83.5 mg/dL     Total  Cholesterol: 155 mg/dL        Essential hypertension    Chronic, elevated in office today however endorses good readings at home so no changes made today.   I did ask him to bring in BP cuff to next visit to ensure accurate.  Consider increased lisinopril dose if BP running high at home.        COPD (chronic obstructive pulmonary disease) (HCC)    Chronic, continued smoker. On respiratory medications spiriva and symbicort Encouraged full smoking cessation.        Prediabetes    Reviewed recent A1c with patient, encouraged limiting added sugars in diet.        History of hepatitis C    Hx, resolved. LFTs normal.        Vitamin D deficiency    Continue 1000 IU vit D replacement daily.        Obesity, Class I, BMI 30-34.9    Encouraged renewed efforts for ongoing weight loss       BPH (benign prostatic hyperplasia)    Recently completed Urolift procedure [name]). Notes some lower abdominal pulling pressure since, no discomfort, and improvement in urination .       Habitual alcohol use    Reviewed alcohol use, encouraged continued taper.        Advanced care planning/counseling discussion - Primary    Advanced directives: has at home. HCPOA is daughter, [name]. Will bring [place] copy. Has decided to decline bringing copy of living will for our records        Tobacco abuse    Encouraged ongoing smoking cessation. Continue yearly screening lung cancer CTs       Aortic atherosclerosis ([facility])    Continue eliquis and statin.        Lumbar radiculopathy, chronic    Latest laminectomy L2/3 (06/2020)       Persistent atrial fibrillation ([facility])    Followed by cardiology, continue eliquis.        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  Has decided to "live with it"   To consider trial of gabapentin to see if effective.         No orders of the defined types were placed in this encounter.  No orders of the defined types were placed in this encounter.   Patient instructions: Bring in BP cuff to next visit to compare.  If interested, check with [facility] or pharmacy about new 2 shot shingles series (shingrix).  Continue current medicines, consider gabapentin nerve pain medicine to see if it would help chronic thigh pain.  Return as needed or in 6 months for follow  up visit   Follow up plan: Return in about 6 months (around 07/07/2021) for follow up visit.  Ria Bush, MD

## 2021-01-05 NOTE — Assessment & Plan Note (Addendum)
Chronic, not resolved despite multiple surgical interventions over the past year.  Has decided to "live with it"  To consider trial of gabapentin to see if effective.

## 2021-01-05 NOTE — Assessment & Plan Note (Addendum)
Encouraged ongoing smoking cessation. Continue yearly screening lung cancer CTs

## 2021-01-05 NOTE — Assessment & Plan Note (Signed)
Recently completed Urolift procedure [name]). Notes some lower abdominal pulling pressure since, no discomfort, and improvement in urination .

## 2021-01-05 NOTE — Assessment & Plan Note (Signed)
Continue 1000 IU vit D replacement daily.

## 2021-01-05 NOTE — Patient Instructions (Addendum)
Bring in BP cuff to next visit to compare.  If interested, check with [facility] or pharmacy about new 2 shot shingles series (shingrix).  Continue current medicines, consider gabapentin nerve pain medicine to see if it would help chronic thigh pain.  Return as needed or in 1 year for next wellness visit.   Health Maintenance After Age 70 After age 74, you are at a higher risk for certain long-term diseases and infections as well as injuries from falls. Falls are a major cause of broken bones and head injuries in people who are older than age 54. Getting regular preventive care can help to keep you healthy and well. Preventive care includes getting regular testing and making lifestyle changes as recommended by your health care provider. Talk with your health care provider about: Which screenings and tests you should have. A screening is a test that checks for a disease when you have no symptoms. A diet and exercise plan that is right for you. What should I know about screenings and tests to prevent falls? Screening and testing are the best ways to find a health problem early. Early diagnosis and treatment give you the best chance of managing medical conditions that are common after age 57. Certain conditions and lifestyle choices may make you more likely to have a fall. Your health care provider may recommend: Regular vision checks. Poor vision and conditions such as cataracts can make you more likely to have a fall. If you wear glasses, make sure to get your prescription updated if your vision changes. Medicine review. Work with your health care provider to regularly review all of the medicines you are taking, including over-the-counter medicines. Ask your health care provider about any side effects that may make you more likely to have a fall. Tell your health care provider if any medicines that you take make you feel dizzy or sleepy. Osteoporosis screening. Osteoporosis is a condition that causes the bones  to get weaker. This can make the bones weak and cause them to break more easily. Blood pressure screening. Blood pressure changes and medicines to control blood pressure can make you feel dizzy. Strength and balance checks. Your health care provider may recommend certain tests to check your strength and balance while standing, walking, or changing positions. Foot health exam. Foot pain and numbness, as well as not wearing proper footwear, can make you more likely to have a fall. Depression screening. You may be more likely to have a fall if you have a fear of falling, feel emotionally low, or feel unable to do activities that you used to do. Alcohol use screening. Using too much alcohol can affect your balance and may make you more likely to have a fall. What actions can I take to lower my risk of falls? General instructions Talk with your health care provider about your risks for falling. Tell your health care provider if: You fall. Be sure to tell your health care provider about all falls, even ones that seem minor. You feel dizzy, sleepy, or off-balance. Take over-the-counter and prescription medicines only as told by your health care provider. These include any supplements. Eat a healthy diet and maintain a healthy weight. A healthy diet includes low-fat dairy products, low-fat (lean) meats, and fiber from whole grains, beans, and lots of fruits and vegetables. Home safety Remove any tripping hazards, such as rugs, cords, and clutter. Install safety equipment such as grab bars in bathrooms and safety rails on stairs. Keep rooms and walkways well-lit.  Activity  Follow a regular exercise program to stay fit. This will help you maintain your balance. Ask your health care provider what types of exercise are appropriate for you. If you need a cane or walker, use it as recommended by your health care provider. Wear supportive shoes that have nonskid soles.  Lifestyle Do not drink alcohol if  your health care provider tells you not to drink. If you drink alcohol, limit how much you have: 0-1 drink a day for women. 0-2 drinks a day for men. Be aware of how much alcohol is in your drink. In the [place] one drink equals one typical bottle of beer (12 oz), one-half glass of wine (5 oz), or one shot of hard liquor (1 oz). Do not use any products that contain nicotine or tobacco, such as cigarettes and e-cigarettes. If you need help quitting, ask your health care provider. Summary Having a healthy lifestyle and getting preventive care can help to protect your health and wellness after age 64. Screening and testing are the best way to find a health problem early and help you avoid having a fall. Early diagnosis and treatment give you the best chance for managing medical conditions that are more common for people who are older than age 17. Falls are a major cause of broken bones and head injuries in people who are older than age 72. Take precautions to prevent a fall at home. Work with your health care provider to learn what changes you can make to improve your health and wellness and to prevent falls. This information is not intended to replace advice given to you by your health care provider. Make sure you discuss any questions you have with your healthcare provider. Document Revised: 06/19/2020 Document Reviewed: 06/19/2020 Elsevier Patient Education  2022 [employer].

## 2021-01-05 NOTE — Assessment & Plan Note (Signed)
Continue eliquis and statin. 

## 2021-01-05 NOTE — Assessment & Plan Note (Signed)
Encouraged renewed efforts for ongoing weight loss

## 2021-01-05 NOTE — Assessment & Plan Note (Addendum)
Chronic, elevated in office today however endorses good readings at home so no changes made today.   I did ask him to bring in BP cuff to next visit to ensure accurate.  Consider increased lisinopril dose if BP running high at home.

## 2021-01-07 DIAGNOSIS — K5730 Diverticulosis of large intestine without perforation or abscess without bleeding: Secondary | ICD-10-CM | POA: Diagnosis not present

## 2021-01-07 DIAGNOSIS — K635 Polyp of colon: Secondary | ICD-10-CM | POA: Diagnosis not present

## 2021-01-07 DIAGNOSIS — Z1211 Encounter for screening for malignant neoplasm of colon: Secondary | ICD-10-CM | POA: Diagnosis not present

## 2021-01-07 DIAGNOSIS — D122 Benign neoplasm of ascending colon: Secondary | ICD-10-CM | POA: Diagnosis not present

## 2021-01-07 DIAGNOSIS — K6389 Other specified diseases of intestine: Secondary | ICD-10-CM | POA: Diagnosis not present

## 2021-01-07 HISTORY — PX: COLONOSCOPY: SHX174

## 2021-01-21 ENCOUNTER — Telehealth: Payer: Self-pay

## 2021-01-21 NOTE — Chronic Care Management (AMB) (Addendum)
Chronic Care Management Pharmacy Assistant   Name: [name]  MRN: [id number] DOB: [birth year]  Reason for Encounter: Initial Questions and Chart Review for 01/26/21 CCM Appointment   Recent office visits:  01/05/21- PCP- Advanced care planning / Annual Exam. No changes. Discussed increased lisinopril if home BP running high, consider trial of gabapentin for right leg pain.   Recent consult visits:  12/08/20- Urology- Ordered urinalysis, ordered bladder scan. Patient reporting stopping Flomax around 12/01/20. Advised patient to take NSAIDS for next 2 weeks as tolerated. 11/23/20- Cardiology- Restarted 5 mg Apixaban, increased lisinopril to 10 mg. Decreased metoprolol to 50 mg BID. Patient advised he is no longer taking Tamsulosin and hydrocodone.  11/02/20- Urology- [facility]20- [facility]- Covid 19 vaccination 09/22/20- [facility]- Follow up. No additional information available.  09/14/20- Cardiology- Approved holding eliquis for 3 days prior to patients cystoscopy on 11/02/20.  08/27/20- Urology- Ordered bladder scan. No changes in medication. Discussed cystoscopy with UroLift.  08/24/20- Cardiology- Decreased lisinopril from 10 mg to 5 mg daily. Increased metoprolol to 100 mg BID. Follow up in 3 months.  Hospital visits:  None in previous 6 months  Medications: Outpatient Encounter Medications as of 01/21/2021  Medication Sig   Albuterol Sulfate (PROAIR RESPICLICK) 660 (90 Base) MCG/ACT AEPB Inhale 2 puffs into the lungs every 6 (six) hours as needed (cough, shortness of breath).   apixaban (ELIQUIS) 5 MG TABS tablet Take 5 mg by mouth 2 (two) times daily.   atorvastatin (LIPITOR) 10 MG tablet TAKE 1 TABLET(10 MG) BY MOUTH DAILY   budesonide-formoterol (SYMBICORT) 80-4.5 MCG/ACT inhaler Inhale 2 puffs into the lungs 2 (two) times daily as needed (shortness of breath).   Cholecalciferol 25 MCG (1000 UT) tablet Take 1,000 Units by mouth daily.   Fluticasone-Salmeterol (ADVAIR) 100-50 MCG/DOSE  AEPB Inhale 1 puff into the lungs daily as needed (Shortness of breath).   lisinopril (ZESTRIL) 10 MG tablet Take 1 tablet (10 mg total) by mouth daily.   loratadine (CLARITIN) 10 MG tablet Take 10 mg by mouth daily.   metoprolol tartrate (LOPRESSOR) 100 MG tablet Take 50 mg by mouth 2 (two) times daily.   OVER THE COUNTER MEDICATION Take 2 tablets by mouth daily. sulfurzyme   OVER THE COUNTER MEDICATION Take 1,500 mg by mouth daily. BLM Capsules - Young Living Essential Oils 750 mg each   tiotropium (SPIRIVA) 18 MCG inhalation capsule Place 18 mcg into inhaler and inhale daily as needed.   No facility-administered encounter medications on file as of 01/21/2021.    Lab Results  Component Value Date/Time   HGBA1C 5.9 12/29/2020 08:07 AM   HGBA1C 5.6 07/02/2020 08:15 AM   HGBA1C 5.8 03/17/2020 12:00 AM   MICROALBUR <0.7 03/02/2016 07:59 AM   MICROALBUR 2.1 (H) 09/04/2015 08:26 AM     BP Readings from Last 3 Encounters:  01/05/21 (!) 160/84  12/08/20 (!) 153/108  11/23/20 (!) 170/96    Patient contacted to review initial questions prior to visit with Debbora Dus.  Have you seen any other providers since your last visit with PCP? No  Any changes in your medications or health? No  Any side effects from any medications? No  Do you have an symptoms or problems not managed by your medications? No  Any concerns about your health right now? No  Has your provider asked that you check blood pressure, blood sugar, or follow special diet at home? Yes  Do you get any type of exercise on a  regular basis? No  Can you think of a goal you would like to reach for your health? No  Do you have any problems getting your medications? No  Is there anything that you would like to discuss during the appointment? No   [name] was reminded to have all medications, supplements and any blood glucose and blood pressure readings available for review with Debbora Dus, Pharm. D, at his  telephone visit on 01/26/21 at 2:00 PM.    Star Rating Drugs:  Medication:  Last Fill: Day Supply Lisinopril 10 mg 08/23/20  90 (mg was halved from 08/24/20 - 01/05/21) Atorvastatin 10 mg  11/19/20  90  Care Gaps: Last annual wellness visit: 01/05/21   Debbora Dus, CPP notified  Margaretmary Dys, [place] [phone]  I have reviewed the care management and care coordination activities outlined in this encounter and I am certifying that I agree with the content of this note. No further action required.  Debbora Dus, PharmD Clinical Pharmacist [facility] [phone]

## 2021-01-25 ENCOUNTER — Other Ambulatory Visit: Payer: Self-pay | Admitting: *Deleted

## 2021-01-25 MED ORDER — METOPROLOL TARTRATE 50 MG PO TABS: 50.0000 mg | ORAL_TABLET | Freq: Two times a day (BID) | ORAL | 0 refills | Status: DC

## 2021-01-26 ENCOUNTER — Other Ambulatory Visit: Payer: Self-pay

## 2021-01-26 ENCOUNTER — Ambulatory Visit (INDEPENDENT_AMBULATORY_CARE_PROVIDER_SITE_OTHER): Payer: Medicare Other

## 2021-01-26 DIAGNOSIS — J449 Chronic obstructive pulmonary disease, unspecified: Secondary | ICD-10-CM | POA: Diagnosis not present

## 2021-01-26 DIAGNOSIS — I10 Essential (primary) hypertension: Secondary | ICD-10-CM

## 2021-01-26 DIAGNOSIS — E7800 Pure hypercholesterolemia, unspecified: Secondary | ICD-10-CM

## 2021-01-26 NOTE — Progress Notes (Signed)
Chronic Care Management Pharmacy Note  01/26/2021 Name:  [name] MRN:  [id number] DOB:  [birth year]   Subjective: [name] is an 70 y.o. year old male who is a primary patient of Ria Bush, MD.  The CCM team was consulted for assistance with disease management and care coordination needs.    Engaged with patient by telephone for initial visit in response to provider referral for pharmacy case management and/or care coordination services.   Consent to Services:  The patient was given the following information about Chronic Care Management services today, agreed to services, and gave verbal consent: 1. CCM service includes personalized support from designated clinical staff supervised by the primary care provider, including individualized plan of care and coordination with other care providers 2. 24/7 contact phone numbers for assistance for urgent and routine care needs. 3. Service will only be billed when office clinical staff spend 20 minutes or more in a month to coordinate care. 4. Only one practitioner may furnish and bill the service in a calendar month. 5.The patient may stop CCM services at any time (effective at the end of the month) by phone call to the office staff. 6. The patient will be responsible for cost sharing (co-pay) of up to 20% of the service fee (after annual deductible is met). Patient agreed to services and consent obtained.  Patient Care Team: Ria Bush, MD as PCP - General (Family Medicine) Kate Sable, MD as PCP - Cardiology (Cardiology) Debbora Dus, [facility] as Pharmacist (Pharmacist)  Recent office visits:  01/05/21 - PCP- Advanced care planning / Annual Exam. No changes. Home BP readings well controlled - 110-120/70s.I did ask him to bring in BP cuff to next visit to ensure accurate.Discussed increased lisinopril if home BP running high. Consider  trial of gabapentin for right leg pain.    Recent consult visits:  12/08/20 -  Urology - Ordered urinalysis, ordered bladder scan. Patient reporting stopping Flomax around 12/01/20. Advised patient to take NSAIDS for next 2 weeks as tolerated. 11/23/20 - Cardiology - Restarted 5 mg Apixaban, increased lisinopril to 10 mg. Decreased metoprolol to 50 mg BID. Patient advised he is no longer taking Tamsulosin and hydrocodone. 11/02/20 - [facility] - [facility]. No additional information available. 09/14/20 - Cardiology - Approved holding eliquis for 3 days prior to patients cystoscopy on 11/02/20. 08/27/20 - Urology - Ordered bladder scan. No changes in medication. Discussed cystoscopy with [facility]. 08/24/20 - Cardiology - Decreased lisinopril from 10 mg to 5 mg daily. Increased metoprolol to 100 mg BID. Follow up in 3 months.   Hospital visits:  None in previous 6 months   Objective:  Lab Results  Component Value Date   CREATININE 0.85 12/29/2020   BUN 10 12/29/2020   GFR 88.66 12/29/2020   GFRNONAA >60 10/26/2020   GFRAA 104 04/24/2020   NA 133 (L) 12/29/2020   K 4.6 12/29/2020   CALCIUM 9.3 12/29/2020   CO2 28 12/29/2020   GLUCOSE 121 (H) 12/29/2020    Lab Results  Component Value Date/Time   HGBA1C 5.9 12/29/2020 08:07 AM   HGBA1C 5.6 07/02/2020 08:15 AM   HGBA1C 5.8 03/17/2020 12:00 AM   GFR 88.66 12/29/2020 08:07 AM   GFR 86.30 07/02/2020 08:15 AM   MICROALBUR <0.7 03/02/2016 07:59 AM   MICROALBUR 2.1 (H) 09/04/2015 08:26 AM    Last diabetic Eye exam:  Lab Results  Component Value  Date/Time   HMDIABEYEEXA No Retinopathy 01/15/2018 12:00 AM    Last diabetic Foot exam: 02/2020 per chart   Lab Results  Component Value Date   CHOL 155 12/29/2020   HDL 83.50 12/29/2020   LDLCALC 64 12/29/2020   LDLDIRECT 82.0 09/07/2016   TRIG 39.0 12/29/2020   CHOLHDL 2 12/29/2020    Hepatic Function Latest Ref Rng & Units 12/29/2020 07/02/2020 05/11/2020  Total Protein 6.0 - 8.3 g/dL 6.8 6.7 6.5   Albumin 3.5 - 5.2 g/dL 4.4 4.4 4.2  AST 0 - 37 U/L _0 ALT 0 - 53 U/L _1 Alk Phosphatase 39 - 117 U/L 66 78 58  Total Bilirubin 0.2 - 1.2 mg/dL 1.0 1.0 1.4(H)  Bilirubin, Direct 0.0 - 0.3 mg/dL - - -    Lab Results  Component Value Date/Time   TSH 3.42 03/27/2020 03:04 PM   TSH 1.650 12/08/2013 05:00 AM    CBC Latest Ref Rng & Units 10/26/2020 07/02/2020 05/11/2020  WBC 4.0 - 10.5 K/uL 5.1 4.7 5.8  Hemoglobin 13.0 - 17.0 g/dL 13.0 13.1 12.7(L)  Hematocrit 39.0 - 52.0 % 37.2(L) 38.7(L) 38.1(L)  Platelets 150 - 400 K/uL 222 232.0 204    Lab Results  Component Value Date/Time   VD25OH 43.40 12/29/2020 08:07 AM   VD25OH 23.94 (L) 07/02/2020 08:15 AM    Clinical ASCVD: Yes  The 10-year ASCVD risk score Mikey Bussing DC Jr., et al., 2013) is: 41.2%   Values used to calculate the score:     Age: 43 years     Sex: Male     Is Non-Hispanic African American: No     Diabetic: Yes     Tobacco smoker: Yes     Systolic Blood Pressure: 735 mmHg     Is BP treated: Yes     HDL Cholesterol: 83.5 mg/dL     Total Cholesterol: 155 mg/dL    Depression screen [facility] 2/9 12/29/2020 10/02/2019 09/12/2018  Decreased Interest 0 0 0  Down, Depressed, Hopeless 0 0 0  PHQ - 2 Score 0 0 0  Altered sleeping 0 0 0  Tired, decreased energy 0 0 0  Change in appetite 0 0 0  Feeling bad or failure about yourself  0 0 0  Trouble concentrating 0 0 0  Moving slowly or fidgety/restless 0 0 0  Suicidal thoughts 0 0 0  PHQ-9 Score 0 0 0  Difficult doing work/chores Not difficult at all Not difficult at all Not difficult at all  Some recent data might be hidden    Social History   Tobacco Use  Smoking Status Every Day   Packs/day: 0.50   Years: 40.00   Pack years: 20.00   Types: Cigarettes  Smokeless Tobacco Never   BP Readings from Last 3 Encounters:  01/05/21 (!) 160/84  12/08/20 (!) 153/108  11/23/20 (!) 170/96   Pulse Readings from Last 3 Encounters:  01/05/21 61  12/08/20 76   11/23/20 68   Wt Readings from Last 3 Encounters:  01/05/21 186 lb 6 oz (84.5 kg)  12/08/20 180 lb (81.6 kg)  11/23/20 189 lb (85.7 kg)   BMI Readings from Last 3 Encounters:  01/05/21 31.25 kg/m  12/08/20 29.05 kg/m  11/23/20 30.51 kg/m    Assessment/Interventions: Review of patient past medical history, allergies, medications, health status, including review of consultants reports, laboratory and other test data, was performed as part of comprehensive evaluation and provision of chronic care management services.  SDOH:  (Social Determinants of Health) assessments and interventions performed: Yes  SDOH Screenings   Alcohol Screen: Low Risk    Last Alcohol Screening Score (AUDIT): 5  Depression (PHQ2-9): Low Risk    PHQ-2 Score: 0  Financial Resource Strain: Low Risk    Difficulty of Paying Living Expenses: Not hard at all  Food Insecurity: No Food Insecurity   Worried About Charity fundraiser in the Last Year: Never true   Ran Out of Food in the Last Year: Never true  Housing: Low Risk    Last Housing Risk Score: 0  Physical Activity: Inactive   Days of Exercise per Week: 0 days   Minutes of Exercise per Session: 0 min  Social Connections: Not on file  Stress: No Stress Concern Present   Feeling of Stress : Not at all  Tobacco Use: High Risk   Smoking Tobacco Use: Every Day   Smokeless Tobacco Use: Never  Transportation Needs: No Transportation Needs   Lack of Transportation (Medical): No   Lack of Transportation (Non-Medical): No    CCM Care Plan  Allergies  Allergen Reactions   Chantix [Varenicline] Other (See Comments)    headaches   Other Other (See Comments)    Medications Reviewed Today     Reviewed by Debbora Dus, [facility] (Pharmacist) on 01/26/21 at 1425  Med List Status: <None>   Medication Order Taking? Sig Documenting Provider Last Dose Status Informant  Albuterol Sulfate (PROAIR RESPICLICK) 427 (90 Base) MCG/ACT AEPB [id number] Yes Inhale 2  puffs into the lungs every 6 (six) hours as needed (cough, shortness of breath). Ria Bush, MD Taking Active Self  apixaban (ELIQUIS) 5 MG TABS tablet [id number] Yes Take 5 mg by mouth 2 (two) times daily. [provider] Taking Active   atorvastatin (LIPITOR) 10 MG tablet [id number] Yes TAKE 1 TABLET(10 MG) BY MOUTH DAILY Ria Bush, MD Taking Active   Cholecalciferol 25 MCG (1000 UT) tablet [id number] Yes Take 1,000 Units by mouth daily. [provider] Taking Active Self  Fluticasone-Salmeterol (ADVAIR) 100-50 MCG/DOSE AEPB [id number] Yes Inhale 1 puff into the lungs daily as needed (Shortness of breath). [provider] Taking Active Self  lisinopril (ZESTRIL) 10 MG tablet [id number] Yes Take 1 tablet (10 mg total) by mouth daily. Kate Sable, MD Taking Active   loratadine (CLARITIN) 10 MG tablet [id number] Yes Take 10 mg by mouth daily. [provider] Taking Active Self  metoprolol tartrate (LOPRESSOR) 50 MG tablet [id number] Yes Take 1 tablet (50 mg total) by mouth 2 (two) times daily. Kate Sable, MD Taking Active   OVER THE COUNTER MEDICATION [id number] Yes Take 2 tablets by mouth daily. sulfurzyme [provider] Taking Active Self  OVER THE COUNTER MEDICATION [id number] Yes Take 1,500 mg by mouth daily. BLM Capsules - [facility] Essential Oils 750 mg each [provider] Taking Active Self  tiotropium (SPIRIVA) 18 MCG inhalation capsule [id number] Yes Place 18 mcg into inhaler and inhale daily as needed. 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 [facility]) 05/14/2020   Spinal stenosis 04/21/2020   Incomplete emptying of bladder 04/08/2020   Pre-op evaluation 03/27/2020    Persistent atrial fibrillation ([place]) 03/27/2020   Macrocytic anemia  06/20/2019   Lumbar radiculopathy, chronic 04/24/2019   Kidney cyst, acquired 12/12/2017   Personal history of tobacco use, presenting hazards to health 09/27/2016   Aortic atherosclerosis ([place]) 09/15/2016   CAD (coronary artery disease) 09/15/2016   Tinea pedis of both feet 03/07/2016   Tobacco abuse 09/30/2015   Advanced care planning/counseling discussion 09/03/2014   Primary osteoarthritis of right knee 06/27/2014   Status post total right knee replacement 06/27/2014   Habitual alcohol use 12/08/2013   Initial Medicare annual wellness visit 08/25/2011   Vitamin D deficiency 08/26/2010   Obesity, Class I, BMI 30-34.9 08/26/2010   BPH (benign prostatic hyperplasia) 08/26/2010   Onychomycosis 02/08/2010   PREMATURE VENTRICULAR CONTRACTIONS 11/17/2009   PVD 11/06/2008   Snoring 11/06/2008   HLD (hyperlipidemia) 07/31/2007   Essential hypertension 07/31/2007   Allergic rhinitis 07/31/2007   COPD (chronic obstructive pulmonary disease) ([place]) 07/31/2007   DEGENERATIVE DISC DISEASE, CERVICAL SPINE 07/31/2007   Prediabetes 07/31/2007   History of hepatitis C 07/31/2007    Immunization History  Administered Date(s) Administered   Fluad Quad(high Dose 65+) 04/23/2019, 04/21/2020   Influenza Split 04/17/2017   Influenza Whole 05/18/2005   Influenza, High Dose Seasonal PF 04/26/2018   Influenza,inj,Quad PF,6+ Mos 04/16/2014, 04/14/2015, 04/15/2016, 05/05/2017   Influenza,trivalent, recombinat, inj, PF 04/10/2018   Influenza-Unspecified 04/18/2019, 04/17/2020   Moderna Sars-Covid-2 Vaccination 10/23/2020   PFIZER(Purple Top)SARS-COV-2 Vaccination 08/24/2019, 09/17/2019   Pneumococcal Conjugate-13 09/09/2016, 04/10/2018   Pneumococcal Polysaccharide-23 08/26/2013, 09/12/2018   Td 10/18/2002   Tdap 08/24/2012, 04/17/2014   Zoster, Live 10/03/2013    Conditions to be addressed/monitored:  Hypertension,  Hyperlipidemia, Coronary Artery Disease, and COPD  Care Plan : [place]  Updates made by Debbora Dus, [place] since 01/29/2021 12:00 AM     Problem: CHL AMB "PATIENT-SPECIFIC PROBLEM"      Long-Range Goal: Disease Management   Start Date: 01/26/2021  Priority: High  Note:   Current Barriers:  Not using maintenance inhalers as prescribed, uncontrolled COPD symptoms   Pharmacist Clinical Goal(s):  Patient will contact provider office for questions/concerns as evidenced notation of same in electronic health record through collaboration with PharmD and provider.   Interventions: 1:1 collaboration with Ria Bush, MD regarding development and update of comprehensive plan of care as evidenced by provider attestation and co-signature Inter-disciplinary care team collaboration (see longitudinal plan of care) Comprehensive medication review performed; medication list updated in electronic medical record  Hypertension (BP goal <140/90) -Controlled - per home blood pressure readings  -Current treatment: Lisinopril 10 mg - 1 tablet daily Metoprolol tartrate 50 mg - 1 tablet twice daily -Medications previously tried: none   -Current home readings: reports he has white coat syndrome, usually runs 100-115/60-70s at home  -Denies hypotensive/hypertensive symptoms -Educated on BP goals and benefits of medications for prevention of heart attack, stroke and kidney damage; -Counseled to monitor BP at home daily, document, and provide log at future appointments -Recommended to continue current medication  Hyperlipidemia: (LDL goal < 70) -Controlled - LDL 64 -Current treatment: Atorvastatin 10 mg - 1 tablet daily -Medications previously tried: none  -Educated on Cholesterol goals;  -Recommended to continue current medication  Atrial Fibrillation (Goal: prevent stroke and major bleeding) -Controlled - no symptoms reported Patient reports he recently was diagnosed with AFIB.  He is taking his metoprolol and Eliquis as prescribed. He is concerned about bruising easily with blood thinner. We discussed this is normal with anticoagulant use. He bruises are after an accidental injury/bump and go away  with time. -Current treatment: Rate control: Metoprolol tartrate 50 mg - 1 tablet twice daily Anticoagulation: Eliquis 5 mg - 1 tablet twice daily -Cardiology helped him apply for PAP for Eliquis through [employer], he has been approved -Medications previously tried: none -Home BP and HR readings: see above  -Counseled on importance of adherence to anticoagulant exactly as prescribed; avoidance of NSAIDs due to increased bleeding risk with anticoagulants; -Recommended to continue current medication  COPD (Goal: control symptoms and prevent exacerbations) -Not ideally controlled - per patient report of twice daily albuterol use -Current treatment  Maintenance: Spiriva 18 mcg - 1 capsule by inhalation daily (taking PRN) Advair - 1 puff daily as needed (taking PRN) Rescue:  Albuterol inhaler - PRN OTC Loratadine 10 mg daily  -Medications previously tried: He reports Advair replaced Symbicort  -He does not take any inhalers routinely. When he had SOB, "he just grabs the inhaler closest to him". He has never taken Spiriva and Advair together. Always rotates through different inhalers. SOB is worse when humidity is up. He requires a rescue or PRN inhaler up to twice daily in the summer. In winter, he uses rescue or PRN inhaler every week or 2. Feels better within 30 minutes.  -He is not interested in quitting smoking at this time. Quit smoking 2 years ago and gained 50 lbs. States he had to choose between obesity and tobacco use. -Exacerbations requiring treatment in last 6 months: none -Patient denies consistent use of maintenance inhaler -Frequency of rescue inhaler use: twice a day -Counseled on Differences between maintenance and rescue inhalers;  -Recommended to continue  current medication; Try taking the Spiriva every day. Then add Advair every day if still experiencing daily symptoms. Reserve albuterol for wheezing as needed.  OTC -  [facility] - takes 5-6 different vitamins for overall health  Vitamin D3 1000 IU - 1 tablet daily  Patient Goals/Self-Care Activities Patient will:  - take medications as prescribed  Follow Up Plan: Telephone follow up appointment with care management team member scheduled for: 6 months     Medication Assistance: None required.  Patient affirms current coverage meets needs.  Compliance/Adherence/Medication fill history: Care Gaps: Shingrix - check with local pharmacy  Star-Rating Drugs: Medication:                Last Fill:         Day Supply Lisinopril 10 mg           08/23/20              90 (mg was halved from 08/24/20 - 01/05/21) Atorvastatin 10 mg      11/19/20              90  Patient's preferred pharmacy is:  [facility] [place], [address] [place] [address] Phone: [phone] Fax: [phone]  He gets his inhalers from [place], other meds through [facility] Uses pill box? No - lays out them out, no pillbox  Pt endorses 100% compliance, denies missed doses   We discussed: Current pharmacy is preferred with insurance plan and patient is satisfied with pharmacy services Patient decided to: Continue current medication management strategy  Care Plan and Follow Up Patient Decision:  Patient agrees to Care Plan and Follow-up.  Debbora Dus, PharmD Clinical Pharmacist [facility] at [facility] [phone]

## 2021-01-27 ENCOUNTER — Encounter: Payer: Self-pay | Admitting: Family Medicine

## 2021-01-27 DIAGNOSIS — K625 Hemorrhage of anus and rectum: Secondary | ICD-10-CM | POA: Diagnosis not present

## 2021-01-27 DIAGNOSIS — K640 First degree hemorrhoids: Secondary | ICD-10-CM | POA: Diagnosis not present

## 2021-01-29 NOTE — Patient Instructions (Signed)
January 29, 2021  Dear [name],  It was a pleasure meeting you during our initial appointment on January 26, 2021. Below is a summary of the goals we discussed and components of chronic care management. Please contact me anytime with questions or concerns.   Visit Information  Patient Care Plan: CCM Pharmacy Care Plan     Problem Identified: CHL AMB "PATIENT-SPECIFIC PROBLEM"      Long-Range Goal: Disease Management   Start Date: 01/26/2021  Priority: High  Note:   Current Barriers:  Not using maintenance inhalers as prescribed, uncontrolled COPD symptoms   Pharmacist Clinical Goal(s):  Patient will contact provider office for questions/concerns as evidenced notation of same in electronic health record through collaboration with PharmD and provider.   Interventions: 1:1 collaboration with Ria Bush, MD regarding development and update of comprehensive plan of care as evidenced by provider attestation and co-signature Inter-disciplinary care team collaboration (see longitudinal plan of care) Comprehensive medication review performed; medication list updated in electronic medical record  Hypertension (BP goal <140/90) -Controlled - per home blood pressure readings  -Current treatment: Lisinopril 10 mg - 1 tablet daily Metoprolol tartrate 50 mg - 1 tablet twice daily -Medications previously tried: none   -Current home readings: reports he has white coat syndrome, usually runs 100-115/60-70s at home  -Denies hypotensive/hypertensive symptoms -Educated on BP goals and benefits of medications for prevention of heart attack, stroke and kidney damage; -Counseled to monitor BP at home daily, document, and provide log at future appointments -Recommended to continue current medication  Hyperlipidemia: (LDL goal < 70) -Controlled - LDL 64 -Current treatment: Atorvastatin 10 mg - 1 tablet daily -Medications previously tried: none  -Educated on Cholesterol goals;   -Recommended to continue current medication  Atrial Fibrillation (Goal: prevent stroke and major bleeding) -Controlled - no symptoms reported Patient reports he recently was diagnosed with AFIB. He is taking his metoprolol and Eliquis as prescribed. He is concerned about bruising easily with blood thinner. We discussed this is normal with anticoagulant use. He bruises are after an accidental injury/bump and go away with time. -Current treatment: Rate control: Metoprolol tartrate 50 mg - 1 tablet twice daily Anticoagulation: Eliquis 5 mg - 1 tablet twice daily -Cardiology helped him apply for PAP for Eliquis through [employer], he has been approved -Medications previously tried: none -Home BP and HR readings: see above  -Counseled on importance of adherence to anticoagulant exactly as prescribed; avoidance of NSAIDs due to increased bleeding risk with anticoagulants; -Recommended to continue current medication  COPD (Goal: control symptoms and prevent exacerbations) -Not ideally controlled - per patient report of twice daily albuterol use -Current treatment  Maintenance: Spiriva 18 mcg - 1 capsule by inhalation daily (taking PRN) Advair - 1 puff daily as needed (taking PRN) Rescue:  Albuterol inhaler - PRN OTC Loratadine 10 mg daily  -Medications previously tried: He reports Advair replaced Symbicort  -He does not take any inhalers routinely. When he had SOB, "he just grabs the inhaler closest to him". He has never taken Spiriva and Advair together. Always rotates through different inhalers. SOB is worse when humidity is up. He requires a rescue or PRN inhaler up to twice daily in the summer. In winter, he uses rescue or PRN inhaler every week or 2. Feels better within 30 minutes.  -He is not interested in quitting smoking at this time. Quit smoking 2 years ago and gained 50 lbs. States he had to choose between obesity and tobacco use. -Exacerbations requiring  treatment in last 6 months:  none -Patient denies consistent use of maintenance inhaler -Frequency of rescue inhaler use: twice a day -Counseled on Differences between maintenance and rescue inhalers;  -Recommended to continue current medication; Try taking the Spiriva every day. Then add Advair every day if still experiencing daily symptoms. Reserve albuterol for wheezing as needed.  OTC -  [facility] - takes 5-6 different vitamins for overall health  Vitamin D3 1000 IU - 1 tablet daily  Patient Goals/Self-Care Activities Patient will:  - take medications as prescribed  Follow Up Plan: Telephone follow up appointment with care management team member scheduled for: 6 months      Patient verbalizes understanding of instructions provided today and agrees to view in [place].   Debbora Dus, PharmD Clinical Pharmacist [facility] at [facility] [phone]

## 2021-02-23 ENCOUNTER — Other Ambulatory Visit: Payer: Self-pay

## 2021-02-23 MED ORDER — METOPROLOL TARTRATE 50 MG PO TABS: 50.0000 mg | ORAL_TABLET | Freq: Two times a day (BID) | ORAL | 0 refills | Status: DC

## 2021-02-24 ENCOUNTER — Other Ambulatory Visit: Payer: Self-pay | Admitting: *Deleted

## 2021-02-24 MED ORDER — METOPROLOL TARTRATE 50 MG PO TABS: 50.0000 mg | ORAL_TABLET | Freq: Two times a day (BID) | ORAL | 3 refills | Status: AC

## 2021-02-24 NOTE — Telephone Encounter (Signed)
This encounter was created in error - please disregard.

## 2021-03-06 ENCOUNTER — Other Ambulatory Visit: Payer: Self-pay | Admitting: Family Medicine

## 2021-04-19 ENCOUNTER — Telehealth: Payer: Self-pay

## 2021-04-19 NOTE — Chronic Care Management (AMB) (Addendum)
Chronic Care Management Pharmacy Assistant   Name: [name]  MRN: [id number] DOB: 05/20/1951   Reason for Encounter: Hypertension Disease State   Recent office visits:  None since last CCM contact  Recent consult visits:  01/27/21-Gastroenterology-no data found  Hospital visits:  None in previous 6 months  Medications: Outpatient Encounter Medications as of 04/19/2021  Medication Sig   Albuterol Sulfate (PROAIR RESPICLICK) 749 (90 Base) MCG/ACT AEPB Inhale 2 puffs into the lungs every 6 (six) hours as needed (cough, shortness of breath).   apixaban (ELIQUIS) 5 MG TABS tablet Take 5 mg by mouth 2 (two) times daily.   atorvastatin (LIPITOR) 10 MG tablet TAKE 1 TABLET(10 MG) BY MOUTH DAILY   Cholecalciferol 25 MCG (1000 UT) tablet Take 1,000 Units by mouth daily.   Fluticasone-Salmeterol (ADVAIR) 100-50 MCG/DOSE AEPB Inhale 1 puff into the lungs daily as needed (Shortness of breath).   lisinopril (ZESTRIL) 10 MG tablet TAKE 1 TABLET BY MOUTH EVERY DAY   loratadine (CLARITIN) 10 MG tablet Take 10 mg by mouth daily.   metoprolol tartrate (LOPRESSOR) 50 MG tablet Take 1 tablet (50 mg total) by mouth 2 (two) times daily.   OVER THE COUNTER MEDICATION Take 2 tablets by mouth daily. sulfurzyme   OVER THE COUNTER MEDICATION Take 1,500 mg by mouth daily. BLM Capsules - [facility] Essential Oils 750 mg each   sildenafil (VIAGRA) 50 MG tablet Take 50 mg by mouth daily as needed for erectile dysfunction. Taking 1/2 tablet PRN (from [place])   tiotropium (SPIRIVA) 18 MCG inhalation capsule Place 18 mcg into inhaler and inhale daily as needed.   No facility-administered encounter medications on file as of 04/19/2021.     Recent Office Vitals: BP Readings from Last 3 Encounters:  01/05/21 (!) 160/84  12/08/20 (!) 153/108  11/23/20 (!) 170/96   Pulse Readings from Last 3 Encounters:  01/05/21 61  12/08/20 76  11/23/20 68    Wt Readings from Last 3 Encounters:  01/05/21  186 lb 6 oz (84.5 kg)  12/08/20 180 lb (81.6 kg)  11/23/20 189 lb (85.7 kg)     Kidney Function Lab Results  Component Value Date/Time   CREATININE 0.85 12/29/2020 08:07 AM   CREATININE 0.72 10/26/2020 10:05 AM   CREATININE 0.99 03/27/2020 03:04 PM   GFR 88.66 12/29/2020 08:07 AM   GFRNONAA >60 10/26/2020 10:05 AM   GFRAA 104 04/24/2020 12:06 PM    BMP Latest Ref Rng & Units 12/29/2020 10/26/2020 07/02/2020  Glucose 70 - 99 mg/dL 121(H) 110(H) 130(H)  BUN 6 - 23 mg/dL 10 11 9   Creatinine 0.40 - 1.50 mg/dL 0.85 0.72 0.91  BUN/Creat Ratio 10 - 24 - - -  Sodium 135 - 145 mEq/L 133(L) 136 135  Potassium 3.5 - 5.1 mEq/L 4.6 4.2 4.7  Chloride 96 - 112 mEq/L 98 100 100  CO2 19 - 32 mEq/L 28 27 27   Calcium 8.4 - 10.5 mg/dL 9.3 9.2 9.7     Contacted patient on 04/21/21 to discuss hypertension disease state  Current antihypertensive regimen:  Lisinopril 10 mg - 1 tablet daily Metoprolol tartrate 50 mg - 1 tablet twice daily   Patient verbally confirms he is taking the above medications as directed. Yes  How often are you checking your Blood Pressure? daily  he checks his blood pressure in the morning before taking his medication.  Current home BP readings:            BP  PULSE 150/91  -   148/88  -  The patient reports he goes to the [place] and they take his BP often. The patient is aware of elevated BP and plans on discussing with PCP at follow up appointment.  Wrist or arm cuff: arm cuff Caffeine intake: 2 cups of regular coffee every morning Salt intake: does not use any salt  OTC medications including pseudoephedrine or NSAIDs? no  Any readings above 180/120? No  What recent interventions/DTPs have been made by any provider to improve Blood Pressure control since last CPP Visit: none identified, the patient does have some concerns about his BP being elevated and will discuss with PCP soon at appointment  Any recent hospitalizations or ED visits since last  visit with CPP? No  What diet changes have been made to improve Blood Pressure Control? None identified   What exercise is being done to improve your Blood Pressure Control? The patient just got in the house after mulching some leaves, he goes to grocery store, stays active.   Adherence Review: Is the patient currently on ACE/ARB medication? Yes Does the patient have >5 day gap between last estimated fill dates? No   Star Rating Drugs:  Medication:  Last Fill: Day Supply Lisinopril 10mg  04/12/21 90 Atorvastatin 10mg  02/24/21 90    Care Gaps: Annual wellness visit in last year? Yes  01/05/21 Most Recent BP reading:160/84   61-P 01/05/21 Most recent A1C reading:  5.9   12/29/20   PCP appointment on 07/07/21 and Cardiology appointment on 05/27/21   Debbora Dus, CPP notified  [name], [place] Assistant [phone]  I have reviewed the care management and care coordination activities outlined in this encounter and I am certifying that I agree with the content of this note. See addendum.  Debbora Dus, PharmD Clinical Pharmacist [facility] at [facility] [phone]

## 2021-04-28 DIAGNOSIS — Z23 Encounter for immunization: Secondary | ICD-10-CM | POA: Diagnosis not present

## 2021-05-12 NOTE — Progress Notes (Addendum)
BP log  05/06/21-137/74 05/07/21-144/91 05/08/21-172/90 05/09/21-156/86 05/10/21-172/87 05/11/21-156/88  Recordings are after medications first thing in the morning.  Debbora Dus, CPP notified  [name], [place] Assistant [phone]  Total time spent for month CPA: 10 mins  Scheduled follow up 05/18/21 to discuss elevated BP readings.  Debbora Dus, PharmD Clinical Pharmacist [facility] at [facility] [phone]

## 2021-05-18 ENCOUNTER — Telehealth: Payer: Self-pay

## 2021-05-18 ENCOUNTER — Ambulatory Visit (INDEPENDENT_AMBULATORY_CARE_PROVIDER_SITE_OTHER): Payer: Medicare Other

## 2021-05-18 ENCOUNTER — Other Ambulatory Visit: Payer: Self-pay

## 2021-05-18 DIAGNOSIS — I10 Essential (primary) hypertension: Secondary | ICD-10-CM

## 2021-05-18 DIAGNOSIS — J449 Chronic obstructive pulmonary disease, unspecified: Secondary | ICD-10-CM

## 2021-05-18 MED ORDER — LISINOPRIL 20 MG PO TABS: 20.0000 mg | ORAL_TABLET | Freq: Every day | ORAL | 3 refills | Status: DC

## 2021-05-18 NOTE — Patient Instructions (Signed)
Dear [name],  Below is a summary of the goals we discussed during our follow up appointment on May 18, 2021. Please contact me anytime with questions or concerns.   Visit Information   Patient Care Plan: CCM Pharmacy Care Plan     Problem Identified: CHL AMB "PATIENT-SPECIFIC PROBLEM"      Long-Range Goal: Disease Management   Start Date: 01/26/2021  This Visit's Progress: Not on track  Priority: High  Note:   Current Barriers:  Home blood pressure elevated  Pharmacist Clinical Goal(s):  Patient will contact provider office for questions/concerns as evidenced notation of same in electronic health record through collaboration with PharmD and provider.   Interventions: 1:1 collaboration with Ria Bush, MD regarding development and update of comprehensive plan of care as evidenced by provider attestation and co-signature Inter-disciplinary care team collaboration (see longitudinal plan of care) Comprehensive medication review performed; medication list updated in electronic medical record  Hypertension (BP goal <140/90) -Not ideally controlled- per home blood pressure readings  -Current treatment: Lisinopril 10 mg - 1 tablet daily Metoprolol tartrate 50 mg - 1 tablet twice daily -Medications previously tried: none   -Current home readings: reports BP has been running high at home recently -BP cuff came from [place], automatic arm cuff. He checked his BP for 5 days in morning after medications. 05/06/21-137/74 05/07/21-144/91 05/08/21-172/90 05/09/21-156/86 05/10/21-172/87 05/11/21-156/88 -Denies hypotensive/hypertensive symptoms -Educated on BP goals and benefits of medications for prevention of heart attack, stroke and kidney damage; -Counseled to monitor BP at home daily, document, and provide log at future appointments -Recommended to continue current medication; Consider increasing lisinopril to 20 mg daily. Send any changes to [employer] at [employer].  Hyperlipidemia: (LDL goal < 70) -Controlled - LDL 64 (Stable not discussed 05/18/21) -Current treatment: Atorvastatin 10 mg - 1 tablet daily -Medications previously tried: none  -Educated on Cholesterol goals;  -Recommended to continue current medication  Atrial Fibrillation (Goal: prevent stroke and major bleeding) -Controlled (Stable not discussed 05/18/21) Patient reports he recently was diagnosed with AFIB. He is taking his metoprolol and Eliquis as prescribed. He is concerned about bruising easily with blood thinner. We discussed this is normal with anticoagulant use.  The bruises are after an accidental injury/bump and go away with time. -Current treatment: Rate control: Metoprolol tartrate 50 mg - 1 tablet twice daily Anticoagulation: Eliquis 5 mg - 1 tablet twice daily -Cardiology helped him apply for PAP for Eliquis through [facility], he has been approved -Medications previously tried: none -Home BP and HR readings: see above  -Counseled on importance of adherence to anticoagulant exactly as prescribed; avoidance of NSAIDs due to increased bleeding risk with anticoagulants; -Recommended to continue current medication  COPD (Goal: control symptoms and prevent exacerbations) -Not ideally controlled - (Due to time, not discussed 05/18/21) -Current treatment  Maintenance: Spiriva 18 mcg - 1 capsule by inhalation daily (taking PRN) Advair - 1 puff daily as needed (taking PRN) Rescue:  Albuterol inhaler - PRN OTC Loratadine 10 mg daily  -Medications previously tried: He reports Advair replaced Symbicort  -He does not take any inhalers routinely. When he had SOB, "he just grabs the inhaler closest to him". He nevers takes Spiriva and Advair together. Always rotates through different inhalers. SOB is worse when humidity is up. He requires a rescue inhaler up to twice daily in the summer. In winter, he uses rescue or PRN inhaler every week or 2. Feels better within 30 minutes.  -He  is not interested  in quitting smoking at this time. Quit smoking 2 years ago and gained 50 lbs. States he had to choose between obesity and tobacco use. -Exacerbations requiring treatment in last 6 months: none -Patient denies consistent use of maintenance inhaler -Frequency of rescue inhaler use: twice a day -Counseled on Differences between maintenance and rescue inhalers;  -Recommended to continue current medication; Try taking the Spiriva every day. Then add Advair every day if still experiencing daily symptoms. Reserve albuterol for wheezing as needed.  OTC -  [facility] [employer] - takes 5-6 different vitamins for overall health  Vitamin D3 1000 IU - 1 tablet daily  Patient Goals/Self-Care Activities Patient will:  - take medications as prescribed  Follow Up Plan:  -PCP visit 06/2021 -CCM visit 07/2020      Patient verbalizes understanding of instructions provided today and agrees to view in [place].   Debbora Dus, PharmD Clinical Pharmacist [facility] at [facility] [phone]

## 2021-05-18 NOTE — Telephone Encounter (Signed)
Would you like to increase his lisinopril or wait until PCP visit 07/07/21? Thank you,  Sharyn Lull  Summary: -CCM visit prompted by BP log provided to CMA last week. Home readings elevated. Patient checked daily for 5 days after morning medications. His monitor appears to be accurate - it was provided by [facility] and is an automatic, arm cuff. No recent changes in diet or lifestyle appear to be contributing to elevated readings.  05/06/21-137/74 05/07/21-144/91 05/08/21-172/90 05/09/21-156/86 05/10/21-172/87 05/11/21-156/88   Recommendations:  -Recommend increase lisinopril from 10 mg to 20 mg daily (pending PCP approval - see telephone note).   Plan:  -PCP follow up visit next month (labs - 12/14 and visit -12/21) -CCM visit 07/2020

## 2021-05-18 NOTE — Progress Notes (Signed)
Chronic Care Management Pharmacy Note  05/18/2021 Name:  [name] MRN:  [id number] DOB:  May 04, 1951  Summary: -CCM visit prompted by BP log provided to CMA last week. Home readings elevated. Patient checked daily for 5 days after morning medications. His monitor appears to be accurate - it was provided by [facility] and is an automatic, arm cuff. No recent changes in diet or lifestyle appear to be contributing to elevated readings.  05/06/21-137/74 05/07/21-144/91 05/08/21-172/90 05/09/21-156/86 05/10/21-172/87 05/11/21-156/88  Recommendations:  -Recommend increase lisinopril from 10 mg to 20 mg daily (pending PCP approval - see telephone note).  Plan:  -PCP follow up visit next month (labs - 12/14 and visit -12/21) -CCM visit 07/2020  Subjective: [name] is an 70 y.o. year old male who is a primary patient of Ria Bush, MD.  The CCM team was consulted for assistance with disease management and care coordination needs.    Engaged with patient by telephone for follow up visit in response to provider referral for pharmacy case management and/or care coordination services.   Consent to Services:  The patient was given information about Chronic Care Management services, agreed to services, and gave verbal consent prior to initiation of services.  Please see initial visit note for detailed documentation.   Patient Care Team: Ria Bush, MD as PCP - General (Family Medicine) Kate Sable, MD as PCP - Cardiology (Cardiology) Debbora Dus, [facility] as Pharmacist (Pharmacist)  Recent office visits:  None since last CCM visit   Recent consult visits:  None since last CCM visit   Hospital visits:  None in previous 6 months  Objective:  Lab Results  Component Value Date   CREATININE 0.85 12/29/2020   BUN 10 12/29/2020   GFR 88.66 12/29/2020   GFRNONAA >60 10/26/2020   GFRAA 104 04/24/2020   NA 133 (L) 12/29/2020   K 4.6 12/29/2020   CALCIUM 9.3  12/29/2020   CO2 28 12/29/2020   GLUCOSE 121 (H) 12/29/2020    Lab Results  Component Value Date/Time   HGBA1C 5.9 12/29/2020 08:07 AM   HGBA1C 5.6 07/02/2020 08:15 AM   HGBA1C 5.8 03/17/2020 12:00 AM   GFR 88.66 12/29/2020 08:07 AM   GFR 86.30 07/02/2020 08:15 AM   MICROALBUR <0.7 03/02/2016 07:59 AM   MICROALBUR 2.1 (H) 09/04/2015 08:26 AM    Last diabetic Eye exam: Lab Results  Component Value Date/Time   HMDIABEYEEXA No Retinopathy 01/15/2018 12:00 AM    Last diabetic Foot exam: 02/2020  Lab Results  Component Value Date   CHOL 155 12/29/2020   HDL 83.50 12/29/2020   LDLCALC 64 12/29/2020   LDLDIRECT 82.0 09/07/2016   TRIG 39.0 12/29/2020   CHOLHDL 2 12/29/2020    Hepatic Function Latest Ref Rng & Units 12/29/2020 07/02/2020 05/11/2020  Total Protein 6.0 - 8.3 g/dL 6.8 6.7 6.5  Albumin 3.5 - 5.2 g/dL 4.4 4.4 4.2  AST 0 - 37 U/L _0 ALT 0 - 53 U/L _1 Alk Phosphatase 39 - 117 U/L 66 78 58  Total Bilirubin 0.2 - 1.2 mg/dL 1.0 1.0 1.4(H)  Bilirubin, Direct 0.0 - 0.3 mg/dL - - -    Lab Results  Component Value Date/Time   TSH 3.42 03/27/2020 03:04 PM   TSH 1.650 12/08/2013 05:00 AM    CBC Latest Ref Rng & Units 10/26/2020 07/02/2020 05/11/2020  WBC 4.0 - 10.5 K/uL 5.1 4.7 5.8  Hemoglobin 13.0 - 17.0 g/dL 13.0 13.1 12.7(L)  Hematocrit  39.0 - 52.0 % 37.2(L) 38.7(L) 38.1(L)  Platelets 150 - 400 K/uL 222 232.0 204    Lab Results  Component Value Date/Time   VD25OH 43.40 12/29/2020 08:07 AM   VD25OH 23.94 (L) 07/02/2020 08:15 AM    Clinical ASCVD: Yes  The 10-year ASCVD risk score (Arnett DK, et al., 2019) is: 41.2%   Values used to calculate the score:     Age: 44 years     Sex: Male     Is Non-Hispanic African American: No     Diabetic: Yes     Tobacco smoker: Yes     Systolic Blood Pressure: 552 mmHg     Is BP treated: Yes     HDL Cholesterol: 83.5 mg/dL     Total Cholesterol: 155 mg/dL    Depression screen [facility] 2/9 12/29/2020 10/02/2019  09/12/2018  Decreased Interest 0 0 0  Down, Depressed, Hopeless 0 0 0  PHQ - 2 Score 0 0 0  Altered sleeping 0 0 0  Tired, decreased energy 0 0 0  Change in appetite 0 0 0  Feeling bad or failure about yourself  0 0 0  Trouble concentrating 0 0 0  Moving slowly or fidgety/restless 0 0 0  Suicidal thoughts 0 0 0  PHQ-9 Score 0 0 0  Difficult doing work/chores Not difficult at all Not difficult at all Not difficult at all  Some recent data might be hidden    Social History   Tobacco Use  Smoking Status Every Day   Packs/day: 0.50   Years: 40.00   Pack years: 20.00   Types: Cigarettes  Smokeless Tobacco Never   BP Readings from Last 3 Encounters:  01/05/21 (!) 160/84  12/08/20 (!) 153/108  11/23/20 (!) 170/96   Pulse Readings from Last 3 Encounters:  01/05/21 61  12/08/20 76  11/23/20 68   Wt Readings from Last 3 Encounters:  01/05/21 186 lb 6 oz (84.5 kg)  12/08/20 180 lb (81.6 kg)  11/23/20 189 lb (85.7 kg)   BMI Readings from Last 3 Encounters:  01/05/21 31.25 kg/m  12/08/20 29.05 kg/m  11/23/20 30.51 kg/m    Assessment/Interventions: Review of patient past medical history, allergies, medications, health status, including review of consultants reports, laboratory and other test data, was performed as part of comprehensive evaluation and provision of chronic care management services.   SDOH:  (Social Determinants of Health) assessments and interventions performed: Yes  SDOH Screenings   Alcohol Screen: Low Risk    Last Alcohol Screening Score (AUDIT): 5  Depression (PHQ2-9): Low Risk    PHQ-2 Score: 0  Financial Resource Strain: Low Risk    Difficulty of Paying Living Expenses: Not hard at all  Food Insecurity: No Food Insecurity   Worried About Charity fundraiser in the Last Year: Never true   Ran Out of Food in the Last Year: Never true  Housing: Low Risk    Last Housing Risk Score: 0  Physical Activity: Inactive   Days of Exercise per Week: 0 days    Minutes of Exercise per Session: 0 min  Social Connections: Not on file  Stress: No Stress Concern Present   Feeling of Stress : Not at all  Tobacco Use: High Risk   Smoking Tobacco Use: Every Day   Smokeless Tobacco Use: Never   Passive Exposure: Not on file  Transportation Needs: No Transportation Needs   Lack of Transportation (Medical): No   Lack of Transportation (Non-Medical): No  CCM Care Plan  Allergies  Allergen Reactions   Chantix [Varenicline] Other (See Comments)    headaches   Other Other (See Comments)    Medications Reviewed Today     Reviewed by Debbora Dus, [facility] (Pharmacist) on 05/18/21 at 1147  Med List Status: <None>   Medication Order Taking? Sig Documenting Provider Last Dose Status Informant  Albuterol Sulfate (PROAIR RESPICLICK) 333 (90 Base) MCG/ACT AEPB [id number] No Inhale 2 puffs into the lungs every 6 (six) hours as needed (cough, shortness of breath). Ria Bush, MD Taking Active Self  apixaban (ELIQUIS) 5 MG TABS tablet [id number] No Take 5 mg by mouth 2 (two) times daily. [provider] Taking Active   atorvastatin (LIPITOR) 10 MG tablet [id number] No TAKE 1 TABLET(10 MG) BY MOUTH DAILY Ria Bush, MD Taking Active   Cholecalciferol 25 MCG (1000 UT) tablet [id number] No Take 1,000 Units by mouth daily. [provider] Taking Active Self  Fluticasone-Salmeterol (ADVAIR) 100-50 MCG/DOSE AEPB [id number] No Inhale 1 puff into the lungs daily as needed (Shortness of breath). [provider] Taking Active Self  lisinopril (ZESTRIL) 10 MG tablet [id number]  TAKE 1 TABLET BY MOUTH EVERY DAY Ria Bush, MD  Active   loratadine (CLARITIN) 10 MG tablet [id number] No Take 10 mg by mouth daily. [provider] Taking Active Self  metoprolol tartrate (LOPRESSOR) 50 MG tablet [id number]  Take 1 tablet (50 mg total) by mouth 2 (two) times daily. Kate Sable, MD  Active   OVER THE COUNTER MEDICATION  [id number] No Take 2 tablets by mouth daily. sulfurzyme [provider] Taking Active Self  OVER THE COUNTER MEDICATION [id number] No Take 1,500 mg by mouth daily. BLM Capsules - [facility] [employer] Essential Oils 750 mg each [provider] Taking Active Self  sildenafil (VIAGRA) 50 MG tablet [id number] No Take 50 mg by mouth daily as needed for erectile dysfunction. Taking 1/2 tablet PRN (from [place]) [provider] Taking Active Self  tiotropium (SPIRIVA) 18 MCG inhalation capsule [id number] No Place 18 mcg into inhaler and inhale daily as needed. 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 [facility]) 05/14/2020   Spinal stenosis 04/21/2020   Incomplete emptying of bladder 04/08/2020   Pre-op evaluation 03/27/2020   Persistent atrial fibrillation ([place]) 03/27/2020   Macrocytic anemia 06/20/2019   Lumbar radiculopathy, chronic 04/24/2019   Kidney cyst, acquired 12/12/2017   Personal history of tobacco use, presenting hazards to health 09/27/2016   Aortic atherosclerosis ([place]) 09/15/2016   CAD (coronary artery disease) 09/15/2016   Tinea pedis of both feet 03/07/2016   Tobacco abuse 09/30/2015   Advanced care planning/counseling discussion 09/03/2014   Primary osteoarthritis of right knee 06/27/2014   Status post total right knee replacement 06/27/2014   Habitual alcohol use 12/08/2013   Initial Medicare annual wellness visit 08/25/2011   Vitamin D deficiency 08/26/2010   Obesity, Class I, BMI 30-34.9 08/26/2010   BPH (benign prostatic hyperplasia) 08/26/2010   Onychomycosis 02/08/2010   PREMATURE VENTRICULAR CONTRACTIONS 11/17/2009   PVD 11/06/2008   Snoring 11/06/2008   HLD (hyperlipidemia) 07/31/2007   Essential  hypertension 07/31/2007   Allergic rhinitis 07/31/2007   COPD (chronic obstructive pulmonary disease) ([place]) 07/31/2007   DEGENERATIVE DISC  DISEASE, CERVICAL SPINE 07/31/2007   Prediabetes 07/31/2007   History of hepatitis C 07/31/2007    Immunization History  Administered Date(s) Administered   Fluad Quad(high Dose 65+) 04/23/2019, 04/21/2020   Influenza Split 04/17/2017   Influenza Whole 05/18/2005   Influenza, High Dose Seasonal PF 04/26/2018   Influenza,inj,Quad PF,6+ Mos 04/16/2014, 04/14/2015, 04/15/2016, 05/05/2017   Influenza,trivalent, recombinat, inj, PF 04/10/2018   Influenza-Unspecified 04/18/2019, 04/17/2020   Moderna Sars-Covid-2 Vaccination 10/23/2020   PFIZER(Purple Top)SARS-COV-2 Vaccination 08/24/2019, 09/17/2019   Pneumococcal Conjugate-13 09/09/2016, 04/10/2018   Pneumococcal Polysaccharide-23 08/26/2013, 09/12/2018   Td 10/18/2002   Tdap 08/24/2012, 04/17/2014   Zoster, Live 10/03/2013    Conditions to be addressed/monitored:  Hypertension, Hyperlipidemia, Coronary Artery Disease, and COPD  Care Plan : [place]  Updates made by Debbora Dus, [place] since 05/18/2021 12:00 AM     Problem: CHL AMB "PATIENT-SPECIFIC PROBLEM"      Long-Range Goal: Disease Management   Start Date: 01/26/2021  This Visit's Progress: Not on track  Priority: High  Note:   Current Barriers:  Home blood pressure elevated  Pharmacist Clinical Goal(s):  Patient will contact provider office for questions/concerns as evidenced notation of same in electronic health record through collaboration with PharmD and provider.   Interventions: 1:1 collaboration with Ria Bush, MD regarding development and update of comprehensive plan of care as evidenced by provider attestation and co-signature Inter-disciplinary care team collaboration (see longitudinal plan of care) Comprehensive medication review performed; medication list updated in electronic medical  record  Hypertension (BP goal <140/90) -Not ideally controlled- per home blood pressure readings  -Current treatment: Lisinopril 10 mg - 1 tablet daily Metoprolol tartrate 50 mg - 1 tablet twice daily -Medications previously tried: none   -Current home readings: reports BP has been running high at home recently -BP cuff came from [place], automatic arm cuff. He checked his BP for 5 days in morning after medications. 05/06/21-137/74 05/07/21-144/91 05/08/21-172/90 05/09/21-156/86 05/10/21-172/87 05/11/21-156/88 -Denies hypotensive/hypertensive symptoms -Educated on BP goals and benefits of medications for prevention of heart attack, stroke and kidney damage; -Counseled to monitor BP at home daily, document, and provide log at future appointments -Recommended to continue current medication; Consider increasing lisinopril to 20 mg daily. Send any changes to [employer] at [facility].  Hyperlipidemia: (LDL goal < 70) -Controlled - LDL 64 (Stable not discussed 05/18/21) -Current treatment: Atorvastatin 10 mg - 1 tablet daily -Medications previously tried: none  -Educated on Cholesterol goals;  -Recommended to continue current medication  Atrial Fibrillation (Goal: prevent stroke and major bleeding) -Controlled (Stable not discussed 05/18/21) Patient reports he recently was diagnosed with AFIB. He is taking his metoprolol and Eliquis as prescribed. He is concerned about bruising easily with blood thinner. We discussed this is normal with anticoagulant use.  The bruises are after an accidental injury/bump and go away with time. -Current treatment: Rate control: Metoprolol tartrate 50 mg - 1 tablet twice daily Anticoagulation: Eliquis 5 mg - 1 tablet twice daily -Cardiology helped him apply for PAP for Eliquis through [facility], he has been approved -Medications previously tried: none -Home BP and HR readings: see above  -Counseled on importance of adherence to anticoagulant exactly as  prescribed; avoidance of NSAIDs due to increased bleeding risk with anticoagulants; -Recommended to continue current medication  COPD (Goal: control symptoms and prevent exacerbations) -Not ideally controlled - (Due to time, not discussed 05/18/21) -Current treatment  Maintenance: Spiriva 18 mcg - 1 capsule by inhalation daily (taking PRN) Advair - 1  puff daily as needed (taking PRN) Rescue:  Albuterol inhaler - PRN OTC Loratadine 10 mg daily  -Medications previously tried: He reports Advair replaced Symbicort  -He does not take any inhalers routinely. When he had SOB, "he just grabs the inhaler closest to him". He nevers takes Spiriva and Advair together. Always rotates through different inhalers. SOB is worse when humidity is up. He requires a rescue inhaler up to twice daily in the summer. In winter, he uses rescue or PRN inhaler every week or 2. Feels better within 30 minutes.  -He is not interested in quitting smoking at this time. Quit smoking 2 years ago and gained 50 lbs. States he had to choose between obesity and tobacco use. -Exacerbations requiring treatment in last 6 months: none -Patient denies consistent use of maintenance inhaler -Frequency of rescue inhaler use: twice a day -Counseled on Differences between maintenance and rescue inhalers;  -Recommended to continue current medication; Try taking the Spiriva every day. Then add Advair every day if still experiencing daily symptoms. Reserve albuterol for wheezing as needed.  OTC -  [employer] - takes 5-6 different vitamins for overall health  Vitamin D3 1000 IU - 1 tablet daily  Patient Goals/Self-Care Activities Patient will:  - take medications as prescribed  Follow Up Plan:  -PCP visit 06/2021 -CCM visit 07/2020      Medication Assistance: None required.  Patient affirms current coverage meets needs.  Compliance/Adherence/Medication fill history: Care Gaps: Shingrix - check with local pharmacy  Star-Rating  Drugs: Medication:                Last Fill:         Day Supply Lisinopril 53m            04/12/21            90 Atorvastatin 171m      02/24/21            90   Patient's preferred pharmacy is:  [facility]: [phone]: [phone] [facility] - lays out them out, no pillbox  Pt endorses 100% compliance, denies missed doses   Care Plan and Follow Up Patient Decision:  Patient agrees to Care Plan and Follow-up.  MiDebbora DusPharmD Clinical Pharmacist [phone]

## 2021-05-18 NOTE — Addendum Note (Signed)
Addended by: Debbora Dus on: 05/18/2021 04:41 PM   Modules accepted: Orders

## 2021-05-18 NOTE — Telephone Encounter (Signed)
Yes please increase lisinopril to 20mg  daily.  Plz have him schedule lab visit in 2 wks to check Cr. I ordered labs.

## 2021-05-20 NOTE — Progress Notes (Addendum)
Patient contacted regarding dose change lisinopril. He will take for 2 weeks and schedule lab appt. He will document some BP readings to take to PCP for follow up appointment.  Debbora Dus, CPP notified  [name], [place] Assistant [phone]  Total time spent for month CPA: 10 min.

## 2021-05-27 ENCOUNTER — Other Ambulatory Visit: Payer: Self-pay

## 2021-05-27 ENCOUNTER — Ambulatory Visit (INDEPENDENT_AMBULATORY_CARE_PROVIDER_SITE_OTHER): Payer: Medicare Other | Admitting: Cardiology

## 2021-05-27 ENCOUNTER — Encounter: Payer: Self-pay | Admitting: Cardiology

## 2021-05-27 VITALS — BP 154/90 | HR 92 | Ht 66.0 in | Wt 193.0 lb

## 2021-05-27 DIAGNOSIS — I2510 Atherosclerotic heart disease of native coronary artery without angina pectoris: Secondary | ICD-10-CM

## 2021-05-27 DIAGNOSIS — F17200 Nicotine dependence, unspecified, uncomplicated: Secondary | ICD-10-CM

## 2021-05-27 DIAGNOSIS — I4819 Other persistent atrial fibrillation: Secondary | ICD-10-CM

## 2021-05-27 DIAGNOSIS — I10 Essential (primary) hypertension: Secondary | ICD-10-CM

## 2021-05-27 MED ORDER — ATORVASTATIN CALCIUM 10 MG PO TABS: ORAL_TABLET | ORAL | 2 refills | Status: DC

## 2021-05-27 NOTE — Patient Instructions (Signed)
Medication Instructions:   Your physician recommends that you continue on your current medications as directed. Please refer to the Current Medication list given to you today.  *If you need a refill on your cardiac medications before your next appointment, please call your pharmacy*   Lab Work: None ordered If you have labs (blood work) drawn today and your tests are completely normal, you will receive your results only by: [place] (if you have MyChart) OR A paper copy in the mail If you have any lab test that is abnormal or we need to change your treatment, we will call you to review the results.   Testing/Procedures: None ordered   Follow-Up: At [facility], you and your health needs are our priority.  As part of our continuing mission to provide you with exceptional heart care, we have created designated Provider Care Teams.  These Care Teams include your primary Cardiologist (physician) and Advanced Practice Providers (APPs -  Physician Assistants and Nurse Practitioners) who all work together to provide you with the care you need, when you need it.  We recommend signing up for the patient portal called "MyChart".  Sign up information is provided on this After Visit Summary.  MyChart is used to connect with patients for Virtual Visits (Telemedicine).  Patients are able to view lab/test results, encounter notes, upcoming appointments, etc.  Non-urgent messages can be sent to your provider as well.   To learn more about what you can do with MyChart, go to [email or web address].    Your next appointment:   6 month(s)  The format for your next appointment:   In Person  Provider:   You may see Kate Sable, MD or one of the following Advanced Practice Providers on your designated Care Team:   Murray Hodgkins, NP Christell Faith, PA-C Cadence Kathlen Mody, [place]    Other Instructions

## 2021-05-27 NOTE — Telephone Encounter (Signed)
E-scribed refill 

## 2021-05-27 NOTE — Progress Notes (Signed)
Cardiology Office Note:    Date:  05/27/2021   ID:  [name], DOB 1951-05-17, MRN [id number]  PCP:  Ria Bush, MD  [facility] Cardiologist:  Kate Sable, MD  [place] Electrophysiologist:  None   Referring MD: Ria Bush, MD   Chief Complaint  Patient presents with   Other    6 month follow up. Meds reviewed verbally with patient.     History of Present Illness:    [name] is a 70 y.o. male with a hx of persistent A. fib on Eliquis, nonobstructive CAD (LHC 2016 LAD 30%, RCA 20%), hypertension, hyperlipidemia,  COPD, current smoker x40+ years who presents for follow-up.    BP was elevated after last visit, lisinopril increased to 10 mg daily.  Blood pressure stays elevated, recently seen by primary care provider 5 days ago, lisinopril further increased to 20 mg daily.  States not eating much salt, takes all medications as prescribed, denies chest pain, palpitations, bleeding issues.  Compliant with Eliquis 5 mg twice daily.   Prior notes  patient with history of neck pains, back pains with radiculopathy.  Diagnosed with cervical spinal stenosis.  Laminectomy is being planned.  Saw primary care provider for preop exam, EKG noted to be in atrial fibrillation rapid ventricular response.  He was started on Eliquis and Lopressor.  Echo 03/2020 showed low normal systolic function, EF 02%. DC cardioversion was attempted on 05/06/2020 unsuccessfully. DC cardioversion attempt on 04/2020 was unsuccessful.  Evaluated by EP, deemed not a good candidate for follow-up ablation given history of COPD and ongoing smoker.     Past Medical History:  Diagnosis Date   Allergic rhinitis 07/1998   Aortic atherosclerosis ([place]) 09/2016   by CT   Arthritis    BACK AND RIGHT KNEE   CAD (coronary artery disease) 09/2016   RCA, LAD by CT   COPD (chronic obstructive pulmonary disease) ([place]) 07/1998   centrilobular emphysema (09/2016) w ongoing tobacco use,  spirometry (02/2013)   ETOH abuse    Headache    migraine- x1   Hepatitis    pos test once not now,  Positive    HTN (hypertension) 05/1998   Hyperlipidemia 06/1996   Lumbar spinal stenosis 03/2013   s/p laminectomy   Persistent atrial fibrillation (HCC)    Positive hepatitis C antibody test 08/2015   neg viral load - likely cleared infection   PVC's (premature ventricular contractions)    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   Shortness of breath dyspnea    WITH EXERTION   Smoker     Past Surgical History:  Procedure Laterality Date   ANTERIOR CERVICAL DECOMP/DISCECTOMY FUSION  2003   C3-7   BACK SURGERY  2007   CARDIAC CATHETERIZATION N/A 07/01/2015   Procedure: Left Heart Cath and Coronary Angiography;  Surgeon: Wellington Hampshire, MD;  Location: [place] [facility];  Service: Cardiovascular;  Laterality: N/A;   CARDIOVERSION N/A 05/06/2020   Procedure: CARDIOVERSION;  Surgeon: Kate Sable, MD;  Location: [facility];  Service: Cardiovascular;  Laterality: N/A;   COLONOSCOPY  09/2010   HPx2, diverticula, hemorrhoids, rpt 10 yrs [place])   COLONOSCOPY  01/07/2021   SSA ([place])   CYSTOSCOPY WITH INSERTION OF UROLIFT N/A 11/02/2020   Procedure: CYSTOSCOPY WITH INSERTION OF UROLIFT;  Surgeon: Hollice Espy, MD;  Location: [facility];  Service: Urology;  Laterality: N/A;   ETT  2010   normal, ABIs normal   JOINT REPLACEMENT  KNEE ARTHROSCOPY Right 2011   [place], ligaments   KNEE ARTHROSCOPY Left 1984   LUMBAR LAMINECTOMY  06/2020   Bilateral L2-3 laminectomy [name])   LUMBAR LAMINECTOMY/DECOMPRESSION MICRODISCECTOMY Left 03/27/2013   Procedure: LUMBAR LAMINECTOMY/DECOMPRESSION MICRODISCECTOMY 2 LEVELS;  Surgeon: Ophelia Charter, MD;  Location: [place] [facility];  Service: Neurosurgery;  Laterality: Left;  LEFT Lumbar Three Four diskectomy with Lumbar Three-Four, Four-Five Laminectomy. L3/4 ahd L4/5 for LSS [name])    LUMBAR LAMINECTOMY/DECOMPRESSION  MICRODISCECTOMY Bilateral 07/15/2020   Procedure: LAMINECTOMY AND FORAMINOTOMY BILATERAL LUMBAR TWO-LUMBAR THREE;  Surgeon: Newman Pies, MD;  Location: [place];  Service: Neurosurgery;  Laterality: Bilateral;   POSTERIOR CERVICAL FUSION/FORAMINOTOMY N/A 05/14/2020   Procedure: LAMINECTOMY, POSTERIOR INSTRUMENTATION AND FUSION, CERVICAL ONE- CERVICAL TWO, CERVICAL TWO- CERVICAL THREE;  Surgeon: Newman Pies, MD;  Location: [place];  Service: Neurosurgery;  Laterality: N/A;   SHOULDER ARTHROSCOPY Right 2011   spriometry  02/2013   COPD with some restriction thought to be obesity related ([place])   TOTAL KNEE ARTHROPLASTY Right 06/27/2014   Procedure: RIGHT TOTAL KNEE ARTHROPLASTY;  Surgeon: Mcarthur Rossetti, MD;  Location: [facility];  Service: Orthopedics;  Laterality: Right;   US ECHOCARDIOGRAPHY  01/2013   WNL, mild diastolic dysfunction    Current Medications: Current Meds  Medication Sig   Albuterol Sulfate (PROAIR RESPICLICK) 449 (90 Base) MCG/ACT AEPB Inhale 2 puffs into the lungs every 6 (six) hours as needed (cough, shortness of breath).   apixaban (ELIQUIS) 5 MG TABS tablet Take 5 mg by mouth 2 (two) times daily.   Cholecalciferol 25 MCG (1000 UT) tablet Take 1,000 Units by mouth daily.   Fluticasone-Salmeterol (ADVAIR) 100-50 MCG/DOSE AEPB Inhale 1 puff into the lungs daily as needed (Shortness of breath).   lisinopril (ZESTRIL) 20 MG tablet Take 1 tablet (20 mg total) by mouth daily.   loratadine (CLARITIN) 10 MG tablet Take 10 mg by mouth daily.   metoprolol tartrate (LOPRESSOR) 50 MG tablet Take 1 tablet (50 mg total) by mouth 2 (two) times daily.   OVER THE COUNTER MEDICATION Take 2 tablets by mouth daily. sulfurzyme   OVER THE COUNTER MEDICATION Take 1,500 mg by mouth daily. BLM Capsules - Young Living Essential Oils 750 mg each   sildenafil (VIAGRA) 50 MG tablet Take 50 mg by mouth daily as needed for erectile dysfunction. Taking 1/2 tablet PRN (from [place])    tiotropium (SPIRIVA) 18 MCG inhalation capsule Place 18 mcg into inhaler and inhale daily as needed.   [DISCONTINUED] atorvastatin (LIPITOR) 10 MG tablet TAKE 1 TABLET(10 MG) BY MOUTH DAILY     Allergies:   Chantix [varenicline] and Other   Social History   Socioeconomic History   Marital status: Married    Spouse name: Not on file   Number of children: Not on file   Years of education: Not on file   Highest education level: Not on file  Occupational History   Not on file  Tobacco Use   Smoking status: Every Day    Packs/day: 0.50    Years: 40.00    Pack years: 20.00    Types: Cigarettes   Smokeless tobacco: Never  Vaping Use   Vaping Use: Never used  Substance and Sexual Activity   Alcohol use: Yes    Alcohol/week: 28.0 standard drinks    Types: 28 Cans of beer per week    Comment: 3-4 beers daily   Drug use: No   Sexual activity: Not Currently  Other Topics Concern  Not on file  Social History Narrative   Lives with wife. Has 1 son, 2 daughters.    Occupation: Psychologist, clinical.    Activity: no exercise   Diet: good water, fruits/vegetables daily      Screened positive for OSA at recent hospitalization (03/2013)   Social Determinants of Health   Financial Resource Strain: Low Risk    Difficulty of Paying Living Expenses: Not hard at all  Food Insecurity: No Food Insecurity   Worried About Charity fundraiser in the Last Year: Never true   Ran Out of Food in the Last Year: Never true  Transportation Needs: No Transportation Needs   Lack of Transportation (Medical): No   Lack of Transportation (Non-Medical): No  Physical Activity: Inactive   Days of Exercise per Week: 0 days   Minutes of Exercise per Session: 0 min  Stress: No Stress Concern Present   Feeling of Stress : Not at all  Social Connections: Not on file     Family History: The patient's family history includes Coronary artery disease in his father and mother; Diabetes in his father;  Hypertension in his father and mother; Irregular heart beat in his brother; Maple syrup urine disease in his brother; Pneumonia in his sister; Stroke in his father and mother. There is no history of Cancer.  ROS:   Please see the history of present illness.     All other systems reviewed and are negative.  EKGs/Labs/Other Studies Reviewed:    The following studies were reviewed today:   EKG:  EKG is ordered today.  EKG showed atrial fibrillation, RVR, heart rate 92  Recent Labs: 10/26/2020: Hemoglobin 13.0; Platelets 222 12/29/2020: ALT 25; BUN 10; Creatinine, Ser 0.85; Potassium 4.6; Sodium 133  Recent Lipid Panel    Component Value Date/Time   CHOL 155 12/29/2020 0807   TRIG 39.0 12/29/2020 0807   HDL 83.50 12/29/2020 0807   CHOLHDL 2 12/29/2020 0807   VLDL 7.8 12/29/2020 0807   LDLCALC 64 12/29/2020 0807   LDLDIRECT 82.0 09/07/2016 0900    Physical Exam:    VS:  BP (!) 154/90 (BP Location: Left Arm, Patient Position: Sitting, Cuff Size: Normal)   Pulse 92   Ht 5\' 6"  (1.676 m)   Wt 193 lb (87.5 kg)   SpO2 95%   BMI 31.15 kg/m     Wt Readings from Last 3 Encounters:  05/27/21 193 lb (87.5 kg)  01/05/21 186 lb 6 oz (84.5 kg)  12/08/20 180 lb (81.6 kg)     GEN:  Well nourished, well developed in no acute distress HEENT: Normal NECK: No JVD; No carotid bruits LYMPHATICS: No lymphadenopathy CARDIAC: Irregular irregular, no murmurs, rubs, gallops RESPIRATORY: Decreased breath sounds, no wheezing ABDOMEN: Soft, non-tender, non-distended MUSCULOSKELETAL:  No edema; No deformity  SKIN: Warm and dry NEUROLOGIC:  Alert and oriented x 3 PSYCHIATRIC:  Normal affect   ASSESSMENT:    1. Persistent atrial fibrillation ([place])   2. Coronary artery disease involving native coronary artery of native heart without angina pectoris   3. Primary hypertension   4. Smoking     PLAN:    In order of problems listed above:  Patient with persistent atrial fibrillation.   CHA2DS2-VASc score 3(age, htn, vasc).  Clinically asymptomatic, echo with low normal EF of about 50%.  failed DCCV on 05/06/2020.  Continue Lopressor 50 mg twice daily, Eliquis 5 mg twice daily. nonobstructive CAD (30% mLAD, 20% mRCA).  Asymptomatic. Continue Eliquis, Lipitor.  LDL  at goal. Hypertension, BP elevated.  Lisinopril recently increased to 20 mg daily.  Continue to monitor, low-salt diet advised, titrate lisinopril for adequate BP control. Current smoker, cessation recommended.  Follow-up in 6 months   Medication Adjustments/Labs and Tests Ordered: Current medicines are reviewed at length with the patient today.  Concerns regarding medicines are outlined above.  Orders Placed This Encounter  Procedures   EKG 12-Lead    No orders of the defined types were placed in this encounter.   Patient Instructions  Medication Instructions:   Your physician recommends that you continue on your current medications as directed. Please refer to the Current Medication list given to you today.  *If you need a refill on your cardiac medications before your next appointment, please call your pharmacy*   Lab Work: None ordered If you have labs (blood work) drawn today and your tests are completely normal, you will receive your results only by: [place] (if you have MyChart) OR A paper copy in the mail If you have any lab test that is abnormal or we need to change your treatment, we will call you to review the results.   Testing/Procedures: None ordered   Follow-Up: At [facility], you and your health needs are our priority.  As part of our continuing mission to provide you with exceptional heart care, we have created designated Provider Care Teams.  These Care Teams include your primary Cardiologist (physician) and Advanced Practice Providers (APPs -  Physician Assistants and Nurse Practitioners) who all work together to provide you with the care you need, when you need  it.  We recommend signing up for the patient portal called "MyChart".  Sign up information is provided on this After Visit Summary.  MyChart is used to connect with patients for Virtual Visits (Telemedicine).  Patients are able to view lab/test results, encounter notes, upcoming appointments, etc.  Non-urgent messages can be sent to your provider as well.   To learn more about what you can do with MyChart, go to [email or web address].    Your next appointment:   6 month(s)  The format for your next appointment:   In Person  Provider:   You may see Kate Sable, MD or one of the following Advanced Practice Providers on your designated Care Team:   Murray Hodgkins, NP Christell Faith, PA-C Cadence Kathlen Mody, [place]    Other Instructions    Signed, Kate Sable, MD  05/27/2021 12:46 PM    [place]

## 2021-06-15 ENCOUNTER — Ambulatory Visit: Payer: Medicare Other | Admitting: Urology

## 2021-06-16 DIAGNOSIS — J449 Chronic obstructive pulmonary disease, unspecified: Secondary | ICD-10-CM | POA: Diagnosis not present

## 2021-06-16 DIAGNOSIS — M79604 Pain in right leg: Secondary | ICD-10-CM | POA: Diagnosis not present

## 2021-06-16 DIAGNOSIS — I10 Essential (primary) hypertension: Secondary | ICD-10-CM

## 2021-06-30 ENCOUNTER — Other Ambulatory Visit: Payer: Medicare Other

## 2021-07-02 ENCOUNTER — Other Ambulatory Visit: Payer: Self-pay

## 2021-07-02 ENCOUNTER — Other Ambulatory Visit (INDEPENDENT_AMBULATORY_CARE_PROVIDER_SITE_OTHER): Payer: Medicare Other

## 2021-07-02 DIAGNOSIS — I10 Essential (primary) hypertension: Secondary | ICD-10-CM | POA: Diagnosis not present

## 2021-07-02 LAB — BASIC METABOLIC PANEL
BUN: 16 mg/dL (ref 6–23)
CO2: 27 mEq/L (ref 19–32)
Calcium: 9.5 mg/dL (ref 8.4–10.5)
Chloride: 101 mEq/L (ref 96–112)
Creatinine, Ser: 0.94 mg/dL (ref 0.40–1.50)
GFR: 82.42 mL/min (ref >60.00)
Glucose, Bld: 133 mg/dL — ABNORMAL HIGH (ref 70–99)
Potassium: 4.1 mEq/L (ref 3.5–5.1)
Sodium: 137 mEq/L (ref 135–145)

## 2021-07-07 ENCOUNTER — Ambulatory Visit: Payer: Medicare Other | Admitting: Family Medicine

## 2021-07-19 ENCOUNTER — Encounter: Payer: Self-pay | Admitting: Family Medicine

## 2021-07-19 DIAGNOSIS — I359 Nonrheumatic aortic valve disorder, unspecified: Secondary | ICD-10-CM | POA: Insufficient documentation

## 2021-08-03 ENCOUNTER — Telehealth: Payer: Self-pay

## 2021-08-03 NOTE — Chronic Care Management (AMB) (Addendum)
° ° °  Chronic Care Management Pharmacy Assistant   Name: [name]  MRN: [id number] DOB: [birth year]  Reason for Encounter: CCM reminder Call   Conditions to be addressed/monitored: CAD, HTN, HLD, and COPD   Medications: Outpatient Encounter Medications as of 08/03/2021  Medication Sig   Albuterol Sulfate (PROAIR RESPICLICK) 923 (90 Base) MCG/ACT AEPB Inhale 2 puffs into the lungs every 6 (six) hours as needed (cough, shortness of breath).   apixaban (ELIQUIS) 5 MG TABS tablet Take 5 mg by mouth 2 (two) times daily.   atorvastatin (LIPITOR) 10 MG tablet TAKE 1 TABLET(10 MG) BY MOUTH DAILY   Cholecalciferol 25 MCG (1000 UT) tablet Take 1,000 Units by mouth daily.   Fluticasone-Salmeterol (ADVAIR) 100-50 MCG/DOSE AEPB Inhale 1 puff into the lungs daily as needed (Shortness of breath).   lisinopril (ZESTRIL) 20 MG tablet Take 1 tablet (20 mg total) by mouth daily.   loratadine (CLARITIN) 10 MG tablet Take 10 mg by mouth daily.   metoprolol tartrate (LOPRESSOR) 50 MG tablet Take 1 tablet (50 mg total) by mouth 2 (two) times daily.   OVER THE COUNTER MEDICATION Take 2 tablets by mouth daily. sulfurzyme   OVER THE COUNTER MEDICATION Take 1,500 mg by mouth daily. BLM Capsules - Young Living Essential Oils 750 mg each   sildenafil (VIAGRA) 50 MG tablet Take 50 mg by mouth daily as needed for erectile dysfunction. Taking 1/2 tablet PRN (from [place])   tiotropium (SPIRIVA) 18 MCG inhalation capsule Place 18 mcg into inhaler and inhale daily as needed.   No facility-administered encounter medications on file as of 08/03/2021.   [name] did not answer the telephone to remind of upcoming telephone visit with Debbora Dus on 08/10/21 at 3:00pm. Patient was reminded to have all medications, supplements and any blood glucose and blood pressure readings available for review at appointment. If unable to reach, a voicemail was left for patient.   Star Rating Drugs: Medication:  Last  Fill: Day Supply Atorvastatin 10mg  05/27/21 90 Lisinopril 20mg  05/18/21 [place], CPP notified  [name], [place] Assistant [phone]  I have reviewed the care management and care coordination activities outlined in this encounter and I am certifying that I agree with the content of this note. No further action required.  Debbora Dus, PharmD Clinical Pharmacist [facility] [phone]

## 2021-08-04 ENCOUNTER — Other Ambulatory Visit: Payer: Self-pay

## 2021-08-04 ENCOUNTER — Telehealth: Payer: Self-pay | Admitting: Acute Care

## 2021-08-04 DIAGNOSIS — F17210 Nicotine dependence, cigarettes, uncomplicated: Secondary | ICD-10-CM

## 2021-08-04 DIAGNOSIS — Z87891 Personal history of nicotine dependence: Secondary | ICD-10-CM

## 2021-08-04 NOTE — Telephone Encounter (Signed)
Left detailed message for pt to call back to scheduled lung screening CT.

## 2021-08-10 ENCOUNTER — Other Ambulatory Visit: Payer: Self-pay

## 2021-08-10 ENCOUNTER — Ambulatory Visit (INDEPENDENT_AMBULATORY_CARE_PROVIDER_SITE_OTHER): Payer: PPO

## 2021-08-10 ENCOUNTER — Telehealth: Payer: Self-pay

## 2021-08-10 VITALS — BP 122/73

## 2021-08-10 DIAGNOSIS — I10 Essential (primary) hypertension: Secondary | ICD-10-CM

## 2021-08-10 DIAGNOSIS — J449 Chronic obstructive pulmonary disease, unspecified: Secondary | ICD-10-CM

## 2021-08-10 DIAGNOSIS — R7303 Prediabetes: Secondary | ICD-10-CM

## 2021-08-10 NOTE — Telephone Encounter (Signed)
Patient wants to schedule his 6 month labs. He cancelled December appt due to office relocation. His BP is much improved on lisinopril 20 mg daily (120-125/70-75 mmHg).

## 2021-08-10 NOTE — Progress Notes (Signed)
Chronic Care Management Pharmacy Note  08/10/2021 Name:  [name] MRN:  [id number] DOB:  08-10-1950  Summary: CCM follow up visit. Discussed HTN, COPD. BP controlled with increased lisinopril dose 20 mg daily and metoprolol. Home readings 120-125/70-75. COPD, controlled per patient report on Wixela 2 puffs daily and albuterol PRN 1-2 times/weekly. This is reduction of albuterol use from 1-2 times daily, significant improvement with adherence to maintenance inhaler. He uses [place] pharmacy for his inhalers. No barriers to adherence identified.   Recommendations:  Reschedule PCP follow up visit (cancelled in Dec)  Plan:  - CCM PharmD follow up 6 months - [place] BP review in 3 months  Subjective: [name] is an 71 y.o. year old male who is a primary patient of Ria Bush, MD.  The CCM team was consulted for assistance with disease management and care coordination needs.    Engaged with patient by telephone for follow up visit in response to provider referral for pharmacy case management and/or care coordination services.   Consent to Services:  The patient was given information about Chronic Care Management services, agreed to services, and gave verbal consent prior to initiation of services.  Please see initial visit note for detailed documentation.   Patient Care Team: Ria Bush, MD as PCP - General (Family Medicine) Kate Sable, MD as PCP - Cardiology (Cardiology) Debbora Dus, [facility] as Pharmacist (Pharmacist)  Recent office visits:  None since last CCM visit   Recent consult visits:  None since last CCM visit   Hospital visits:  None in previous 6 months  Objective:  Lab Results  Component Value Date   CREATININE 0.94 07/02/2021   BUN 16 07/02/2021   GFR 82.42 07/02/2021   GFRNONAA >60 10/26/2020   GFRAA 104 04/24/2020   NA 137 07/02/2021   K 4.1 07/02/2021   CALCIUM 9.5 07/02/2021   CO2 27 07/02/2021   GLUCOSE 133  (H) 07/02/2021    Lab Results  Component Value Date/Time   HGBA1C 5.9 12/29/2020 08:07 AM   HGBA1C 5.6 07/02/2020 08:15 AM   HGBA1C 5.8 03/17/2020 12:00 AM   GFR 82.42 07/02/2021 08:12 AM   GFR 88.66 12/29/2020 08:07 AM   MICROALBUR <0.7 03/02/2016 07:59 AM   MICROALBUR 2.1 (H) 09/04/2015 08:26 AM    Last diabetic Eye exam: Lab Results  Component Value Date/Time   HMDIABEYEEXA No Retinopathy 01/15/2018 12:00 AM    Last diabetic Foot exam: 2021  Lab Results  Component Value Date   CHOL 155 12/29/2020   HDL 83.50 12/29/2020   LDLCALC 64 12/29/2020   LDLDIRECT 82.0 09/07/2016   TRIG 39.0 12/29/2020   CHOLHDL 2 12/29/2020    Hepatic Function Latest Ref Rng & Units 12/29/2020 07/02/2020 05/11/2020  Total Protein 6.0 - 8.3 g/dL 6.8 6.7 6.5  Albumin 3.5 - 5.2 g/dL 4.4 4.4 4.2  AST 0 - 37 U/L '19 14 18  ' ALT 0 - 53 U/L '25 17 20  ' Alk Phosphatase 39 - 117 U/L 66 78 58  Total Bilirubin 0.2 - 1.2 mg/dL 1.0 1.0 1.4(H)  Bilirubin, Direct 0.0 - 0.3 mg/dL - - -    Lab Results  Component Value Date/Time   TSH 3.42 03/27/2020 03:04 PM   TSH 1.650 12/08/2013 05:00 AM    CBC Latest Ref Rng & Units 10/26/2020 07/02/2020 05/11/2020  WBC 4.0 - 10.5 K/uL 5.1 4.7 5.8  Hemoglobin 13.0 - 17.0 g/dL 13.0 13.1 12.7(L)  Hematocrit 39.0 - 52.0 % 37.2(L)  38.7(L) 38.1(L)  Platelets 150 - 400 K/uL 222 232.0 204    Lab Results  Component Value Date/Time   VD25OH 43.40 12/29/2020 08:07 AM   VD25OH 23.94 (L) 07/02/2020 08:15 AM    Clinical ASCVD: Yes  The 10-year ASCVD risk score (Arnett DK, et al., 2019) is: 29.5%   Values used to calculate the score:     Age: 71 years     Sex: Male     Is Non-Hispanic African American: No     Diabetic: Yes     Tobacco smoker: Yes     Systolic Blood Pressure: 407 mmHg     Is BP treated: Yes     HDL Cholesterol: 83.5 mg/dL     Total Cholesterol: 155 mg/dL    Depression screen [facility] 2/9 12/29/2020 10/02/2019 09/12/2018  Decreased Interest 0 0 0  Down,  Depressed, Hopeless 0 0 0  PHQ - 2 Score 0 0 0  Altered sleeping 0 0 0  Tired, decreased energy 0 0 0  Change in appetite 0 0 0  Feeling bad or failure about yourself  0 0 0  Trouble concentrating 0 0 0  Moving slowly or fidgety/restless 0 0 0  Suicidal thoughts 0 0 0  PHQ-9 Score 0 0 0  Difficult doing work/chores Not difficult at all Not difficult at all Not difficult at all  Some recent data might be hidden    Social History   Tobacco Use  Smoking Status Every Day   Packs/day: 0.50   Years: 40.00   Pack years: 20.00   Types: Cigarettes  Smokeless Tobacco Never   BP Readings from Last 3 Encounters:  08/10/21 122/73  05/27/21 (!) 154/90  01/05/21 (!) 160/84   Pulse Readings from Last 3 Encounters:  05/27/21 92  01/05/21 61  12/08/20 76   Wt Readings from Last 3 Encounters:  05/27/21 193 lb (87.5 kg)  01/05/21 186 lb 6 oz (84.5 kg)  12/08/20 180 lb (81.6 kg)   BMI Readings from Last 3 Encounters:  05/27/21 31.15 kg/m  01/05/21 31.25 kg/m  12/08/20 29.05 kg/m    Assessment/Interventions: Review of patient past medical history, allergies, medications, health status, including review of consultants reports, laboratory and other test data, was performed as part of comprehensive evaluation and provision of chronic care management services.   SDOH:  (Social Determinants of Health) assessments and interventions performed: Yes SDOH Interventions    Flowsheet Row Most Recent Value  SDOH Interventions   Financial Strain Interventions Intervention Not Indicated      SDOH Screenings   Alcohol Screen: Low Risk    Last Alcohol Screening Score (AUDIT): 5  Depression (PHQ2-9): Low Risk    PHQ-2 Score: 0  Financial Resource Strain: Low Risk    Difficulty of Paying Living Expenses: Not very hard  Food Insecurity: No Food Insecurity   Worried About Charity fundraiser in the Last Year: Never true   Ran Out of Food in the Last Year: Never true  Housing: Low Risk     Last Housing Risk Score: 0  Physical Activity: Inactive   Days of Exercise per Week: 0 days   Minutes of Exercise per Session: 0 min  Social Connections: Not on file  Stress: No Stress Concern Present   Feeling of Stress : Not at all  Tobacco Use: High Risk   Smoking Tobacco Use: Every Day   Smokeless Tobacco Use: Never   Passive Exposure: Not on file  Transportation Needs: No  Transportation Needs   Lack of Transportation (Medical): No   Lack of Transportation (Non-Medical): No    CCM Care Plan  Allergies  Allergen Reactions   Chantix [Varenicline] Other (See Comments)    headaches   Other Other (See Comments)    Medications Reviewed Today     Reviewed by Debbora Dus, [facility] (Pharmacist) on 08/10/21 at 1556  Med List Status: <None>   Medication Order Taking? Sig Documenting Provider Last Dose Status Informant  Albuterol Sulfate (PROAIR RESPICLICK) 160 (90 Base) MCG/ACT AEPB [id number] Yes Inhale 2 puffs into the lungs every 6 (six) hours as needed (cough, shortness of breath). Ria Bush, MD Taking Active Self  apixaban (ELIQUIS) 5 MG TABS tablet [id number] Yes Take 5 mg by mouth 2 (two) times daily. [provider] Taking Active   atorvastatin (LIPITOR) 10 MG tablet [id number] Yes TAKE 1 TABLET(10 MG) BY MOUTH DAILY Ria Bush, MD Taking Active   Cholecalciferol 25 MCG (1000 UT) tablet [id number] Yes Take 1,000 Units by mouth daily. [provider] Taking Active Self  Fluticasone-Salmeterol (ADVAIR) 100-50 MCG/DOSE AEPB [id number] Yes Inhale 1 puff into the lungs daily as needed (Shortness of breath). [place] [provider] Taking Active Self  lisinopril (ZESTRIL) 20 MG tablet [id number] Yes Take 1 tablet (20 mg total) by mouth daily. Ria Bush, MD Taking Active   loratadine (CLARITIN) 10 MG tablet [id number] Yes Take 10 mg by mouth daily. [provider] Taking Active Self  metoprolol tartrate (LOPRESSOR) 50 MG tablet  [id number] Yes Take 1 tablet (50 mg total) by mouth 2 (two) times daily. Kate Sable, MD Taking Active   OVER THE COUNTER MEDICATION [id number] Yes Take 2 tablets by mouth daily. sulfurzyme [provider] Taking Active Self  OVER THE COUNTER MEDICATION [id number] Yes Take 1,500 mg by mouth daily. BLM Capsules - [facility] [employer] Essential Oils 750 mg each [provider] Taking Active Self  sildenafil (VIAGRA) 50 MG tablet [id number] Yes Take 50 mg by mouth daily as needed for erectile dysfunction. Taking 1/2 tablet PRN (from [place]) [provider] Taking Active Self  tiotropium (SPIRIVA) 18 MCG inhalation capsule [id number] No Place 18 mcg into inhaler and inhale daily as needed.  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 [facility]) 05/14/2020   Spinal stenosis of lumbar region with neurogenic claudication 04/21/2020   Incomplete emptying of bladder 04/08/2020   Pre-op evaluation 03/27/2020   Persistent atrial fibrillation ([place]) 03/27/2020   Macrocytic anemia 06/20/2019   Lumbar radiculopathy, chronic 04/24/2019   Kidney cyst, acquired 12/12/2017   Personal history of tobacco use, presenting hazards to health 09/27/2016   Aortic atherosclerosis ([place]) 09/15/2016   CAD (coronary artery disease) 09/15/2016   Tinea pedis of both feet 03/07/2016   Tobacco abuse 09/30/2015   Advanced care planning/counseling discussion 09/03/2014   Primary osteoarthritis of right knee 06/27/2014   Status post total right knee replacement 06/27/2014   Habitual alcohol use 12/08/2013   Initial Medicare annual wellness visit 08/25/2011   Vitamin D deficiency 08/26/2010   Obesity, Class I, BMI 30-34.9 08/26/2010   BPH (benign  prostatic hyperplasia) 08/26/2010   Onychomycosis 02/08/2010   PREMATURE VENTRICULAR CONTRACTIONS 11/17/2009  PVD 11/06/2008   Snoring 11/06/2008   HLD (hyperlipidemia) 07/31/2007   Essential hypertension 07/31/2007   Allergic rhinitis 07/31/2007   COPD (chronic obstructive pulmonary disease) ([place]) 07/31/2007   DEGENERATIVE [place] DISEASE, CERVICAL SPINE 07/31/2007   Prediabetes 07/31/2007   History of hepatitis C 07/31/2007    Immunization History  Administered Date(s) Administered   Fluad Quad(high Dose 65+) 04/23/2019, 04/21/2020   Influenza Split 04/17/2017   Influenza Whole 05/18/2005   Influenza, High Dose Seasonal PF 04/26/2018   Influenza,inj,Quad PF,6+ Mos 04/16/2014, 04/14/2015, 04/15/2016, 05/05/2017   Influenza,trivalent, recombinat, inj, PF 04/10/2018   Influenza-Unspecified 04/18/2019, 04/17/2020   Moderna Sars-Covid-2 Vaccination 10/23/2020   PFIZER(Purple Top)SARS-COV-2 Vaccination 08/24/2019, 09/17/2019   Pneumococcal Conjugate-13 09/09/2016, 04/10/2018   Pneumococcal Polysaccharide-23 08/26/2013, 09/12/2018   Td 10/18/2002   Tdap 08/24/2012, 04/17/2014   Zoster, Live 10/03/2013    Conditions to be addressed/monitored:  Hypertension and COPD  Care Plan : [place]  Updates made by Debbora Dus, [place] since 08/10/2021 12:00 AM     Problem: CHL AMB "PATIENT-SPECIFIC PROBLEM"      Long-Range Goal: Disease Management   Start Date: 01/26/2021  This Visit's Progress: On track  Recent Progress: Not on track  Priority: High  Note:    Current Barriers:  None identified  Pharmacist Clinical Goal(s):  Patient will contact provider office for questions/concerns as evidenced notation of same in electronic health record through collaboration with PharmD and provider.   Interventions: 1:1 collaboration with Ria Bush, MD regarding development and update of comprehensive plan of care as evidenced by provider attestation and  co-signature Inter-disciplinary care team collaboration (see longitudinal plan of care) Comprehensive medication review performed; medication list updated in electronic medical record  Hypertension (BP goal <140/90) -Controlled, per home readings  -Comorbidities: AFIB -Current treatment: Lisinopril 20 mg - 1 tablet daily - Appropriate, Effective, Safe, Accessible Metoprolol tartrate 50 mg - 1 tablet twice daily - Appropriate, Effective, Safe, Accessible -Medications previously tried: none   -Current home readings:  -BP cuff came from [place], automatic arm cuff. He checks in morning after medications. Reports average --> 120/125/70-75 mmHg  Yesterday 122/73 mmHg -Denies hypotensive/hypertensive symptoms -Educated on BP goals and benefits of medications for prevention of heart attack, stroke and kidney damage; -Counseled to monitor BP at home daily, document, and provide log at future appointments -Recommended to continue current medication  COPD (Goal: control symptoms and prevent exacerbations) -Controlled, he denies frequent SOB. He takes Wixela - 2 puffs every day (from [place]).  Uses albuterol 1-2 times weekly. He has Spiriva but has not taken in > 3 months. This is an improvement from prior use of PRN maintenance inhaler with freuqent albuterol use. He has lung cancer screening 08/16/21.  -Current treatment : Maintenance: Spiriva 18 mcg - 1 capsule by inhalation daily (not taking - last dose months ago)  Wixela 100-50 mcg/dose - 2 puffs twice daily (pt taking differently: 2 puffs once daily) - Query Appropriate - LABA/LAMA preferred over ICS combination in COPD, however pt is prescribed LAMA but does not take it.  Rescue:  Albuterol inhaler - PRN OTC Loratadine 10 mg daily  -Medications previously tried: He reports Wixela replaced Advair -Exacerbations requiring treatment in last 6 months: none -Patient reports consistent use of maintenance inhaler -Frequency of rescue inhaler use: twice  weekly (depending on weather) -Recommended to continue current medication  OTC -  [facility] [employer] - takes 5-6 different vitamins for overall health  Vitamin D3 1000 IU - 1 tablet  daily  Patient Goals/Self-Care Activities Patient will:  - take medications as prescribed - reschedule follow up with PCP (cancelled 06/2021)  Follow Up Plan:  - CCM PharmD follow up 6 months - [place] BP review in 3 months    Medication Assistance: None required.  Patient affirms current coverage meets needs.  Compliance/Adherence/Medication fill history: Care Gaps: Colon cancer screening - annual FOBT (reports he had a colonoscopy this past Spring - [place]) Reports all was normal, f/u 5 years.   Star-Rating Drugs: Medication:                 Last Fill:         Day Supply Lisinopril 20 mg             07/20/2021            90 Atorvastatin 10 mg         05/27/21           90 Metoprolol tartrate 50 mg  [facility] Al[facility]ol    [facility] Eli[facility]      BMS patient assistance  Patient's preferred pharmacy is:  [facility], [address] AT [address] [place] [address] Phone: [phone] Fax: [phone]  He gets his inhalers from [place], other meds through [facility] Uses pill box? No - lays out them out, no pillbox  Pt endorses 100% compliance, denies missed doses   Care Plan and Follow Up Patient Decision:  Patient agrees to Care Plan and Follow-up.  Debbora Dus, PharmD Clinical Pharmacist [facility] at [facility] [phone]

## 2021-08-10 NOTE — Patient Instructions (Signed)
Dear [name],  Below is a summary of the goals we discussed during our follow up appointment on August 10, 2021. Please contact me anytime with questions or concerns.   Visit Information Patient Care Plan: CCM Pharmacy Care Plan     Problem Identified: CHL AMB "PATIENT-SPECIFIC PROBLEM"      Long-Range Goal: Disease Management   Start Date: 01/26/2021  This Visit's Progress: On track  Recent Progress: Not on track  Priority: High  Note:    Current Barriers:  None identified  Pharmacist Clinical Goal(s):  Patient will contact provider office for questions/concerns as evidenced notation of same in electronic health record through collaboration with PharmD and provider.   Interventions: 1:1 collaboration with Pedro Bush, MD regarding development and update of comprehensive plan of care as evidenced by provider attestation and co-signature Inter-disciplinary care team collaboration (see longitudinal plan of care) Comprehensive medication review performed; medication list updated in electronic medical record  Hypertension (BP goal <140/90) -Controlled, per home readings  -Comorbidities: AFIB -Current treatment: Lisinopril 20 mg - 1 tablet daily - Appropriate, Effective, Safe, Accessible Metoprolol tartrate 50 mg - 1 tablet twice daily - Appropriate, Effective, Safe, Accessible -Medications previously tried: none   -Current home readings:  -BP cuff came from [place], automatic arm cuff. He checks in morning after medications. Reports average --> 120/125/70-75 mmHg  Yesterday 122/73 mmHg -Denies hypotensive/hypertensive symptoms -Educated on BP goals and benefits of medications for prevention of heart attack, stroke and kidney damage; -Counseled to monitor BP at home daily, document, and provide log at future appointments -Recommended to continue current medication  COPD (Goal: control symptoms and prevent exacerbations) -Controlled, he denies frequent SOB. He takes  Wixela - 2 puffs every day (from [place]).  Uses albuterol 1-2 times weekly. He has Spiriva but has not taken in > 3 months. This is an improvement from prior use of PRN maintenance inhaler with freuqent albuterol use. He has lung cancer screening 08/16/21.  -Current treatment : Maintenance: Spiriva 18 mcg - 1 capsule by inhalation daily (not taking - last dose months ago)  Wixela 100-50 mcg/dose - 2 puffs twice daily (pt taking differently: 2 puffs once daily) - Query Appropriate - LABA/LAMA preferred over ICS combination in COPD, however pt is prescribed LAMA but does not take it.  Rescue:  Albuterol inhaler - PRN OTC Loratadine 10 mg daily  -Medications previously tried: He reports Wixela replaced Advair -Exacerbations requiring treatment in last 6 months: none -Patient reports consistent use of maintenance inhaler -Frequency of rescue inhaler use: twice weekly (depending on weather) -Recommended to continue current medication  OTC -  [employer] - takes 5-6 different vitamins for overall health  Vitamin D3 1000 IU - 1 tablet daily  Patient Goals/Self-Care Activities Patient will:  - take medications as prescribed - reschedule follow up with PCP (cancelled 06/2021)  Follow Up Plan:  - CCM PharmD follow up 6 months - [place] BP review in 3 months      Patient verbalizes understanding of instructions and care plan provided today and agrees to view in [place]. Active MyChart status confirmed with patient.    Debbora Dus, PharmD Clinical Pharmacist Pract[facility] at [facility] [phone]

## 2021-08-11 NOTE — Telephone Encounter (Signed)
Lvm asking pt to call back.  Ok to schedule lab visit for POC A1c after 09/02/21 at [facility].

## 2021-08-11 NOTE — Telephone Encounter (Signed)
Please schedule lab visit at his convenience for POC A1c - doesn't have to be fasting.

## 2021-08-11 NOTE — Telephone Encounter (Signed)
He already had BMP checked after increasing lisinopril to 20mg  daily.  We could do POC A1c, I'm not sure he needs other labs checked if no other med changes since physical.

## 2021-08-12 NOTE — Telephone Encounter (Signed)
Plz schedule lab visit for POC A1c after 09/02/21 at [facility]

## 2021-08-12 NOTE — Telephone Encounter (Signed)
Called and scheduled pt for labs on 09/06/21

## 2021-08-12 NOTE — Telephone Encounter (Signed)
[place] Night - Client Nonclinical Telephone Record  [facility] Client [place] Night - Client Client Site [place] - Night Contact Type Call Who Is Calling Patient / Member / Family / Caregiver Caller Name Franky Reier Phone Number [phone] Call Type Message Only Information Provided Reason for Call Returning a Call from the Office Initial [place] returning call from [facility]. Time Disposition Final User 08/11/2021 5:01:07 PM General Information Provided Yes Idolina Primer Call Closed By: Idolina Primer Transaction Date/Time: 08/11/2021 4:58:40 PM (ET

## 2021-08-16 ENCOUNTER — Ambulatory Visit
Admission: RE | Admit: 2021-08-16 | Discharge: 2021-08-16 | Disposition: A | Discharge status: Home / Self Care | Payer: PPO | Source: Ambulatory Visit | Attending: Acute Care | Admitting: Acute Care

## 2021-08-16 ENCOUNTER — Other Ambulatory Visit: Payer: Self-pay

## 2021-08-16 DIAGNOSIS — F17210 Nicotine dependence, cigarettes, uncomplicated: Secondary | ICD-10-CM | POA: Diagnosis present

## 2021-08-16 DIAGNOSIS — Z87891 Personal history of nicotine dependence: Secondary | ICD-10-CM | POA: Diagnosis present

## 2021-08-17 ENCOUNTER — Other Ambulatory Visit: Payer: Self-pay | Admitting: Acute Care

## 2021-08-17 DIAGNOSIS — I10 Essential (primary) hypertension: Secondary | ICD-10-CM | POA: Diagnosis not present

## 2021-08-17 DIAGNOSIS — J449 Chronic obstructive pulmonary disease, unspecified: Secondary | ICD-10-CM

## 2021-08-17 DIAGNOSIS — F17210 Nicotine dependence, cigarettes, uncomplicated: Secondary | ICD-10-CM

## 2021-08-17 DIAGNOSIS — Z87891 Personal history of nicotine dependence: Secondary | ICD-10-CM

## 2021-09-06 ENCOUNTER — Other Ambulatory Visit: Payer: Self-pay

## 2021-09-06 ENCOUNTER — Other Ambulatory Visit (INDEPENDENT_AMBULATORY_CARE_PROVIDER_SITE_OTHER): Payer: PPO

## 2021-09-06 DIAGNOSIS — R7303 Prediabetes: Secondary | ICD-10-CM

## 2021-09-06 LAB — POCT GLYCOSYLATED HEMOGLOBIN (HGB A1C): Hemoglobin A1C: 5.7 % — AB (ref 4.0–5.6)

## 2021-09-07 ENCOUNTER — Other Ambulatory Visit: Payer: Self-pay | Admitting: Neurosurgery

## 2021-09-07 ENCOUNTER — Other Ambulatory Visit (HOSPITAL_COMMUNITY): Payer: Self-pay | Admitting: Neurosurgery

## 2021-09-07 ENCOUNTER — Ambulatory Visit: Payer: PPO

## 2021-09-07 DIAGNOSIS — M4802 Spinal stenosis, cervical region: Secondary | ICD-10-CM

## 2021-09-07 DIAGNOSIS — R531 Weakness: Secondary | ICD-10-CM

## 2021-09-08 ENCOUNTER — Ambulatory Visit
Admission: RE | Admit: 2021-09-08 | Discharge: 2021-09-08 | Disposition: A | Discharge status: Home / Self Care | Payer: PPO | Source: Ambulatory Visit | Attending: Neurosurgery | Admitting: Neurosurgery

## 2021-09-08 ENCOUNTER — Other Ambulatory Visit: Payer: Self-pay

## 2021-09-08 DIAGNOSIS — M4802 Spinal stenosis, cervical region: Secondary | ICD-10-CM | POA: Insufficient documentation

## 2021-09-08 DIAGNOSIS — R531 Weakness: Secondary | ICD-10-CM | POA: Insufficient documentation

## 2021-09-13 ENCOUNTER — Other Ambulatory Visit: Payer: Self-pay | Admitting: Neurosurgery

## 2021-09-13 DIAGNOSIS — M5412 Radiculopathy, cervical region: Secondary | ICD-10-CM

## 2021-09-13 DIAGNOSIS — M47812 Spondylosis without myelopathy or radiculopathy, cervical region: Secondary | ICD-10-CM

## 2021-09-13 DIAGNOSIS — R29898 Other symptoms and signs involving the musculoskeletal system: Secondary | ICD-10-CM

## 2021-09-14 ENCOUNTER — Ambulatory Visit
Admission: RE | Admit: 2021-09-14 | Discharge: 2021-09-14 | Disposition: A | Discharge status: Home / Self Care | Payer: PPO | Source: Ambulatory Visit | Attending: Neurosurgery | Admitting: Neurosurgery

## 2021-09-14 ENCOUNTER — Other Ambulatory Visit: Payer: Self-pay

## 2021-09-14 DIAGNOSIS — R29898 Other symptoms and signs involving the musculoskeletal system: Secondary | ICD-10-CM | POA: Diagnosis present

## 2021-09-14 DIAGNOSIS — M47812 Spondylosis without myelopathy or radiculopathy, cervical region: Secondary | ICD-10-CM | POA: Insufficient documentation

## 2021-09-14 DIAGNOSIS — M5412 Radiculopathy, cervical region: Secondary | ICD-10-CM | POA: Insufficient documentation

## 2021-09-24 ENCOUNTER — Telehealth: Payer: Self-pay | Admitting: Cardiology

## 2021-09-24 NOTE — Telephone Encounter (Signed)
? ?  Pre-operative Risk Assessment  ?  ?Patient Name: [name]  ?DOB: May 23, 1951 ?MRN: [id number]  ? ?  ? ?Request for Surgical Clearance   ? ?Procedure:   C6-T3 posterior spinal fusion, C6-T2 posterior spinal decompression  ? ?Date of Surgery:  Clearance 10/27/21                              ?   ?Surgeon:  Meade Maw ?Surgeon's Group or Practice Name:  [facility] [place] ?Phone number:  [phone] ?Fax number:  [phone] ?  ?Type of Clearance Requested:   ?- Pharmacy:  Hold Apixaban (Eliquis) hold 72 hours prior and resume 14 days after - ok to supplement with aspirin '81mg'$  if needed ?  ?Type of Anesthesia:  Not Indicated ?  ?Additional requests/questions:   ? ?Signed, ?Caryl Pina Gerringer   ?09/24/2021, 10:29 AM  ? ?

## 2021-09-27 ENCOUNTER — Other Ambulatory Visit: Payer: Self-pay | Admitting: Neurosurgery

## 2021-09-27 ENCOUNTER — Telehealth: Payer: Self-pay | Admitting: *Deleted

## 2021-09-27 DIAGNOSIS — Z01818 Encounter for other preprocedural examination: Secondary | ICD-10-CM

## 2021-09-27 NOTE — Telephone Encounter (Signed)
?  Patient Consent for Virtual Visit  ? ? ?   ? ?[name] has provided verbal consent on 09/27/2021 for a virtual visit (video or telephone). ? ? ?CONSENT FOR VIRTUAL VISIT FOR:  [name]  ?By participating in this virtual visit I agree to the following: ? ?I hereby voluntarily request, consent and authorize [place] and its employed or contracted physicians, physician assistants, nurse practitioners or other licensed health care professionals (the Practitioner), to provide me with telemedicine health care services (the ?Services") as deemed necessary by the treating Practitioner. I acknowledge and consent to receive the Services by the Practitioner via telemedicine. I understand that the telemedicine visit will involve communicating with the Practitioner through live audiovisual communication technology and the disclosure of certain medical information by electronic transmission. I acknowledge that I have been given the opportunity to request an in-person assessment or other available alternative prior to the telemedicine visit and am voluntarily participating in the telemedicine visit. ? ?I understand that I have the right to withhold or withdraw my consent to the use of telemedicine in the course of my care at any time, without affecting my right to future care or treatment, and that the Practitioner or I may terminate the telemedicine visit at any time. I understand that I have the right to inspect all information obtained and/or recorded in the course of the telemedicine visit and may receive copies of available information for a reasonable fee.  I understand that some of the potential risks of receiving the Services via telemedicine include:  ?Delay or interruption in medical evaluation due to technological equipment failure or disruption; ?Information transmitted may not be sufficient (e.g. poor resolution of images) to allow for appropriate medical decision making by the Practitioner;  and/or  ?In rare instances, security protocols could fail, causing a breach of personal health information. ? ?Furthermore, I acknowledge that it is my responsibility to provide information about my medical history, conditions and care that is complete and accurate to the best of my ability. I acknowledge that Practitioner's advice, recommendations, and/or decision may be based on factors not within their control, such as incomplete or inaccurate data provided by me or distortions of diagnostic images or specimens that may result from electronic transmissions. I understand that the practice of medicine is not an exact science and that Practitioner makes no warranties or guarantees regarding treatment outcomes. I acknowledge that a copy of this consent can be made available to me via my patient portal ([place]), or I can request a printed copy by calling the office of [place].   ? ?I understand that my insurance will be billed for this visit.  ? ?I have read or had this consent read to me. ?I understand the contents of this consent, which adequately explains the benefits and risks of the Services being provided via telemedicine.  ?I have been provided ample opportunity to ask questions regarding this consent and the Services and have had my questions answered to my satisfaction. ?I give my informed consent for the services to be provided through the use of telemedicine in my medical care ? ? ? ?

## 2021-09-27 NOTE — Telephone Encounter (Signed)
Patient with diagnosis of afib on Eliquis for anticoagulation.   ? ?Procedure: C6-T3 posterior spinal fusion, C6-T2 posterior spinal decompression ?Date of procedure: 10/27/21 ? ?CHA2DS2-VASc Score = 3  ?This indicates a 3.2% annual risk of stroke. ?The patient's score is based upon: ?CHF History: 0 ?HTN History: 1 ?Diabetes History: 0 ?Stroke History: 0 ?Vascular Disease History: 1 ?Age Score: 1 ?Gender Score: 0 ?  ?CrCl 52m/min using adjusted body weight due to obesity ?Platelet count 222K ? ?Per office protocol, patient can hold Eliquis for 3 days prior to procedure. Typically for spinal procedures, anticoag is resumed within 48-72 hours post op. Unclear why 2 weeks would be needed. Recommend he resume within the usual recommended timeframe unless office has a clear indication why an extra 2 weeks off of anticoag would be needed, in which case they should reach back out as MD would need to clear very extended anticoag hold. ?

## 2021-09-27 NOTE — Telephone Encounter (Signed)
? ? ?  Name: [name]  ?DOB: 02-22-1951  ?MRN: [id number] ? ?Primary Cardiologist: Kate Sable, MD ? ?Preoperative team, please contact this patient and set up a phone call appointment for further preoperative risk assessment. Please obtain consent and complete medication review. Thank you for your help. ? ? ?Ledora Bottcher, PA-C ?09/27/2021, 1:45 PM ?[phone] ?[facility] ?[address] ?[place], [place] [address] ? ? ?

## 2021-09-27 NOTE — Telephone Encounter (Signed)
Pt agreeable of plan of care for tele visit pre op. Pt asked for me to call him in the AM to go over meds.  ?

## 2021-09-28 ENCOUNTER — Ambulatory Visit (INDEPENDENT_AMBULATORY_CARE_PROVIDER_SITE_OTHER): Payer: PPO | Admitting: Physician Assistant

## 2021-09-28 ENCOUNTER — Other Ambulatory Visit: Payer: Self-pay

## 2021-09-28 DIAGNOSIS — Z01810 Encounter for preprocedural cardiovascular examination: Secondary | ICD-10-CM | POA: Diagnosis not present

## 2021-09-28 NOTE — Progress Notes (Signed)
Virtual Visit via Telephone Note   This visit type was conducted due to national recommendations for restrictions regarding the COVID-19 Pandemic (e.g. social distancing) in an effort to limit this patient's exposure and mitigate transmission in our community.  Due to his co-morbid illnesses, this patient is at least at moderate risk for complications without adequate follow up.  This format is felt to be most appropriate for this patient at this time.  The patient did not have access to video technology/had technical difficulties with video requiring transitioning to audio format only (telephone).  All issues noted in this document were discussed and addressed.  No physical exam could be performed with this format.  Please refer to the patient's chart for his  consent to telehealth for [facility] Evaluation Performed:  Preoperative cardiovascular risk assessment  This visit type was conducted due to national recommendations for restrictions regarding the COVID-19 Pandemic (e.g. social distancing).  This format is felt to be most appropriate for this patient at this time.  All issues noted in this document were discussed and addressed.  No physical exam was performed (except for noted visual exam findings with Video Visits).  Please refer to the patient's chart (MyChart message for video visits and phone note for telephone visits) for the patient's consent to telehealth for [facility]. _____________   Date:  09/28/2021   Patient ID:  [name], DOB Feb 20, 1951, MRN [id number] Patient Location:  Home Provider location:   Office  Primary Care Provider:  Eustaquio Boyden, MD Primary Cardiologist:  Debbe Odea, MD  Chief Complaint    71 y.o. y/o male with a h/o persistent atrial fibrillation, nonobstructive CAD, hypertension, hyperlipidemia, COPD, and tobacco abuse, who is pending C6-T3 posterior fusion surgery, and presents today for telephonic preoperative cardiovascular  risk assessment.  Past Medical History    Past Medical History:  Diagnosis Date   Allergic rhinitis 07/1998   Aortic atherosclerosis (HCC) 09/2016   by CT   Arthritis    BACK AND RIGHT KNEE   CAD (coronary artery disease) 09/2016   RCA, LAD by CT   COPD (chronic obstructive pulmonary disease) (HCC) 07/1998   centrilobular emphysema (09/2016) w ongoing tobacco use, spirometry (02/2013)   ETOH abuse    Headache    migraine- x1   Hepatitis    pos test once not now,  Positive    HTN (hypertension) 05/1998   Hyperlipidemia 06/1996   Lumbar spinal stenosis 03/2013   s/p laminectomy   Persistent atrial fibrillation (HCC)    Positive hepatitis C antibody test 08/2015   neg viral load - likely cleared infection   PVC's (premature ventricular contractions)    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   Shortness of breath dyspnea    WITH EXERTION   Smoker    Past Surgical History:  Procedure Laterality Date   ANTERIOR CERVICAL DECOMP/DISCECTOMY FUSION  2003   C3-7   BACK SURGERY  2007   CARDIAC CATHETERIZATION N/A 07/01/2015   Procedure: Left Heart Cath and Coronary Angiography;  Surgeon: Iran Ouch, MD;  Location: [facility] CV LAB;  Service: Cardiovascular;  Laterality: N/A;   CARDIOVERSION N/A 05/06/2020   Procedure: CARDIOVERSION;  Surgeon: Debbe Odea, MD;  Location: [facility] ORS;  Service: Cardiovascular;  Laterality: N/A;   COLONOSCOPY  09/2010   HPx2, diverticula, hemorrhoids, rpt 10 yrs Elnoria Howard)   COLONOSCOPY  01/07/2021   SSA ([place])   CYSTOSCOPY WITH INSERTION OF UROLIFT N/A 11/02/2020  Procedure: CYSTOSCOPY WITH INSERTION OF UROLIFT;  Surgeon: Vanna Scotland, MD;  Location: [facility] ORS;  Service: Urology;  Laterality: N/A;   ETT  2010   normal, ABIs normal   JOINT REPLACEMENT     KNEE ARTHROSCOPY Right 2011   cartlage, ligaments   KNEE ARTHROSCOPY Left 1984   LUMBAR LAMINECTOMY  06/2020   Bilateral L2-3 laminectomy Lovell Sheehan)   LUMBAR  LAMINECTOMY/DECOMPRESSION MICRODISCECTOMY Left 03/27/2013   Procedure: LUMBAR LAMINECTOMY/DECOMPRESSION MICRODISCECTOMY 2 LEVELS;  Surgeon: Cristi Loron, MD;  Location: [facility] NEURO ORS;  Service: Neurosurgery;  Laterality: Left;  LEFT Lumbar Three Four diskectomy with Lumbar Three-Four, Four-Five Laminectomy. L3/4 ahd L4/5 for LSS Lovell Sheehan)    LUMBAR LAMINECTOMY/DECOMPRESSION MICRODISCECTOMY Bilateral 07/15/2020   Procedure: LAMINECTOMY AND FORAMINOTOMY BILATERAL LUMBAR TWO-LUMBAR THREE;  Surgeon: Tressie Stalker, MD;  Location: [facility] OR;  Service: Neurosurgery;  Laterality: Bilateral;   POSTERIOR CERVICAL FUSION/FORAMINOTOMY N/A 05/14/2020   Procedure: LAMINECTOMY, POSTERIOR INSTRUMENTATION AND FUSION, CERVICAL ONE- CERVICAL TWO, CERVICAL TWO- CERVICAL THREE;  Surgeon: Tressie Stalker, MD;  Location: [facility] OR;  Service: Neurosurgery;  Laterality: N/A;   SHOULDER ARTHROSCOPY Right 2011   spriometry  02/2013   COPD with some restriction thought to be obesity related ([place])   TOTAL KNEE ARTHROPLASTY Right 06/27/2014   Procedure: RIGHT TOTAL KNEE ARTHROPLASTY;  Surgeon: Kathryne Hitch, MD;  Location: [facility] ORS;  Service: Orthopedics;  Laterality: Right;   US ECHOCARDIOGRAPHY  01/2013   WNL, mild diastolic dysfunction    Allergies  Allergies  Allergen Reactions   Chantix [Varenicline] Other (See Comments)    headaches   Other Other (See Comments)    History of Present Illness    [name] is a 71 y.o. male who presents via audio/video conferencing for a telehealth visit today.  Pt was last seen in cardiology clinic on 05/27/2021, by Dr Azucena Cecil.  At that time [name] was doing well.  he is now pending C6-T3 fusion surgery.  Since his last visit, he has been doing well without exertional chest pain or worsening dyspnea.  Previous cardiac catheterization in 2016 revealed minimal CAD.  Given lack of anginal symptom, he is cleared for the upcoming neck surgery.   Home  Medications    Prior to Admission medications   Medication Sig Start Date End Date Taking? Authorizing Provider  Albuterol Sulfate (PROAIR RESPICLICK) 108 (90 Base) MCG/ACT AEPB Inhale 2 puffs into the lungs every 6 (six) hours as needed (cough, shortness of breath). 08/29/17   Eustaquio Boyden, MD  apixaban (ELIQUIS) 5 MG TABS tablet Take 5 mg by mouth 2 (two) times daily.    [provider]  atorvastatin (LIPITOR) 10 MG tablet TAKE 1 TABLET(10 MG) BY MOUTH DAILY 05/27/21   Eustaquio Boyden, MD  Cholecalciferol 25 MCG (1000 UT) tablet Take 1,000 Units by mouth daily. 09/25/19   [provider]  Fluticasone-Salmeterol (ADVAIR) 100-50 MCG/DOSE AEPB Inhale 1 puff into the lungs daily as needed (Shortness of breath). [facility] 04/10/20   [provider]  lisinopril (ZESTRIL) 20 MG tablet Take 1 tablet (20 mg total) by mouth daily. 05/18/21   Eustaquio Boyden, MD  loratadine (CLARITIN) 10 MG tablet Take 10 mg by mouth as needed.    [provider]  metoprolol tartrate (LOPRESSOR) 50 MG tablet Take 1 tablet (50 mg total) by mouth 2 (two) times daily. 02/24/21 09/28/21  Debbe Odea, MD  OVER THE COUNTER MEDICATION Take 2 tablets by mouth daily. sulfurzyme    [provider]  OVER THE COUNTER MEDICATION Take 1,500 mg by mouth daily. BLM Capsules - Young Living Essential Oils 750 mg each    [provider]  sildenafil (VIAGRA) 50 MG tablet Take 50 mg by mouth daily as needed for erectile dysfunction. Taking 1/2 tablet PRN (from [facility])    [provider]  tiotropium (SPIRIVA) 18 MCG inhalation capsule Place 18 mcg into inhaler and inhale daily as needed. Patient not taking: Reported on 09/28/2021 04/06/20   [provider]    Physical Exam    Vital Signs:  [name] does not have vital signs available for review today.  Given telephonic nature of communication, physical exam is limited. AAOx3. NAD. Normal affect.   Speech and respirations are unlabored.  Accessory Clinical Findings    None  Assessment & Plan    1.  Preoperative Cardiovascular Risk Assessment:  -Patient had cardiac catheterization in 2016 that showed minimal CAD.  He denies any recent chest pain or worsening dyspnea on exertion.  He has upcoming neck surgery by Dr. Myer Haff of [facility] neurosurgery.  Our clinical pharmacist has reviewed the case and recommended a 3-day hold of Eliquis prior to the procedure and restart as soon as possible afterward at the surgeon's discretion based on the bleeding risk.  The original cardiac clearance request showed Dr. Lucienne Capers office wish to hold Eliquis for 2 weeks afterward, this seems to be too long although we do usually defer to the surgeon's office to decide on the earliest restarting time.  Previous study shows aspirin does not reduce the stroke risk in A-fib patient, therefore substitution with aspirin is unlikely to help.  I will call [facility] neurosurgery to clarify the restarting time.   COVID-19 Education: The signs and symptoms of COVID-19 were discussed with the patient and how to seek care for testing (follow up with PCP or arrange E-visit).  The importance of social distancing was discussed today.  Patient Risk:   After full review of this patient's history and clinical status, I feel that he is at least moderate risk for cardiac complications at this time, thus necessitating a telehealth visit sooner than our first available in office visit.  Time:   Today, I have spent 5 minutes with the patient with telehealth technology discussing medical history, symptoms, and management plan.     [place], [place]  09/28/2021, 10:36 AM

## 2021-09-28 NOTE — Telephone Encounter (Signed)
I s/w the pt this morning and went over his medications for tele pre op appt today at 10:20. Pt also stated he wanted to let the [facility] know of his last 2 BP's in the last few days.  ? ?3 days ago BP was 129/78 ?2 days ago BP was 108/67 ?

## 2021-09-28 NOTE — Telephone Encounter (Signed)
Dr. Garen [name] to review.  I spoke with the patient over the phone via virtual visit this morning and cleared him for the neck procedure.  He was cleared to hold the Eliquis for 3 days prior to the procedure and will defer to the surgeon to decide if what is the earliest time to restart on the Eliquis after the procedure. ? ?I called and verified with the surgeon's office, Dr. Izora Ribas do prefer to hold the Eliquis for 2 weeks after the neck surgery.  Per Dr. Izora [name], "the annual stroke risk for holding anticoagulation is usually 5-10% depending on CHADSVaSC score (for most patients).  So, holding for 2 weeks is at most a 1/200 stroke risk.  Starting back 3 days after an open neurosurgical procedure is definitely not the standard of care, and would put him at significant risk of a post-op hematoma and potential paralysis.  That risk is highest within the first for the first 14 days, so I normally hold for 14 days and then resume.  The only exceptions to that are things like mechanical mitral valves, which have a much higher stroke risk (1% per day or something like that)." ? ?Unclear by much the stroke risk is higher for [name] since he is essentially in permanent atrial fibrillation at this point. I will forward this to Dr. Garen [name] to review. Dr. Nelly [name] cell phone number in staff message.  ?

## 2021-09-28 NOTE — Telephone Encounter (Signed)
I s/w the pt this morning and went over his medications for tele pre op appt today at 10:20. Pt also stated he wanted to let the [facility] know of his last 2 BP's in the last few days.  ? ?3 days ago BP was 129/78 ?2 days ago BP was 108/67 ?

## 2021-10-01 NOTE — Telephone Encounter (Signed)
Pt had tele pre op visit 09/28/21 with Almyra Deforest, PAC, see notes from [facility] in chart ?

## 2021-10-01 NOTE — Telephone Encounter (Signed)
? ?  Primary Cardiologist: Kate Sable, MD ? ?Chart reviewed as part of pre-operative protocol coverage. Given past medical history and time since last visit, based on ACC/AHA guidelines, [name] would be at acceptable risk for the planned procedure without further cardiovascular testing.  ? ?Per Dr. Garen Lah: ? ?"Okay to hold Eliquis 3 days before procedure.  ? ?Restart as soon as safely possible after procedure as per surgeon.  ? ?If surgeon wants to hold for 2 weeks after procedure, this should be discussed with patient, as obviously there is risk for stroke.  So long as patient has discussed this with  his surgeon and understands risk and is willing to proceed, that should be okay. " ? ? ?Patient was advised that if he develops new symptoms prior to surgery to contact our office to arrange a follow-up appointment.  He verbalized understanding. ? ?I will route this recommendation to the requesting party via Epic fax function and remove from pre-op pool. ? ?Please call with questions. ? ?Jossie Ng. Ronna Herskowitz NP-C ? ?  ?10/01/2021, 8:42 AM ?[place] ?[address] ?Office [phone] Fax [phone] ? ? ? ? ?

## 2021-10-01 NOTE — Telephone Encounter (Signed)
Preoperative team, please contact this patient and set up a phone call appointment for further cardiac evaluation.  Thank you for your help. ? ?Jossie Ng. Anne Sebring NP-C ? ?  ?10/01/2021, 8:25 AM ?[place] ?[address] ?Office [phone] Fax [phone] ? ?

## 2021-10-05 NOTE — Progress Notes (Signed)
When attempting to do a medication history over the phone on 10/05/21 in preparation for his upcoming procedure, the patient insisted he will bring his own medications and take them while at the hospital. The pharmacy tech informed him of [facility]'s home med policy but the patient insisted anyways. ? ?Please refer to policy: entitled Medications Brought from Home. ? ?Romeo Rabon, PharmD ?Coordinator - Medication History Program ?10/05/2021,9:47 AM ?  ? ? ?

## 2021-10-06 ENCOUNTER — Other Ambulatory Visit: Payer: PPO

## 2021-10-06 ENCOUNTER — Encounter
Admission: RE | Admit: 2021-10-06 | Discharge: 2021-10-06 | Disposition: A | Discharge status: Home / Self Care | Payer: PPO | Source: Ambulatory Visit | Attending: Neurosurgery | Admitting: Neurosurgery

## 2021-10-06 ENCOUNTER — Other Ambulatory Visit: Payer: Self-pay

## 2021-10-06 DIAGNOSIS — Z01812 Encounter for preprocedural laboratory examination: Secondary | ICD-10-CM | POA: Insufficient documentation

## 2021-10-06 HISTORY — DX: Other cervical disc degeneration, unspecified cervical region: M50.30

## 2021-10-06 HISTORY — DX: Nutritional anemia, unspecified: D53.9

## 2021-10-06 HISTORY — DX: Peripheral vascular disease, unspecified: I73.9

## 2021-10-06 LAB — BASIC METABOLIC PANEL
Anion gap: 4 — ABNORMAL LOW (ref 5–15)
BUN: 16 mg/dL (ref 8–23)
CO2: 29 mmol/L (ref 22–32)
Calcium: 9.1 mg/dL (ref 8.9–10.3)
Chloride: 104 mmol/L (ref 98–111)
Creatinine, Ser: 0.89 mg/dL (ref 0.61–1.24)
GFR, Estimated: >60 mL/min (ref >60)
Glucose, Bld: 117 mg/dL — ABNORMAL HIGH (ref 70–99)
Potassium: 4.7 mmol/L (ref 3.5–5.1)
Sodium: 137 mmol/L (ref 135–145)

## 2021-10-06 LAB — TYPE AND SCREEN
ABO/RH(D): A POS
Antibody Screen: NEGATIVE

## 2021-10-06 LAB — URINALYSIS, ROUTINE W REFLEX MICROSCOPIC
Bilirubin Urine: NEGATIVE
Glucose, UA: NEGATIVE mg/dL
Hgb urine dipstick: NEGATIVE
Ketones, ur: NEGATIVE mg/dL
Leukocytes,Ua: NEGATIVE
Nitrite: NEGATIVE
Protein, ur: NEGATIVE mg/dL
Specific Gravity, Urine: 1.012 (ref 1.005–1.030)
pH: 7 (ref 5.0–8.0)

## 2021-10-06 LAB — CBC
HCT: 38.3 % — ABNORMAL LOW (ref 39.0–52.0)
Hemoglobin: 13 g/dL (ref 13.0–17.0)
MCH: 32.7 pg (ref 26.0–34.0)
MCHC: 33.9 g/dL (ref 30.0–36.0)
MCV: 96.5 fL (ref 80.0–100.0)
Platelets: 208 10*3/uL (ref 150–400)
RBC: 3.97 MIL/uL — ABNORMAL LOW (ref 4.22–5.81)
RDW: 12.2 % (ref 11.5–15.5)
WBC: 5.2 10*3/uL (ref 4.0–10.5)
nRBC: 0 % (ref 0.0–0.2)

## 2021-10-06 LAB — SURGICAL PCR SCREEN
MRSA, PCR: NEGATIVE
Staphylococcus aureus: NEGATIVE

## 2021-10-06 LAB — PROTIME-INR
INR: 1 (ref 0.8–1.2)
Prothrombin Time: 12.9 seconds (ref 11.4–15.2)

## 2021-10-06 LAB — APTT: aPTT: 28 seconds (ref 24–36)

## 2021-10-06 NOTE — Patient Instructions (Addendum)
Your procedure is scheduled on:10-11-21 Monday ?Report to the Registration Desk on the 1st floor of the [facility].Then proceed to the 2nd floor Surgery Desk in the [facility] ?To find out your arrival time, please call [phone] between 1PM - 3PM on:10-08-21 Friday ? ?REMEMBER: ?Instructions that are not followed completely may result in serious medical risk, up to and including death; or upon the discretion of your surgeon and anesthesiologist your surgery may need to be rescheduled. ? ?Do not eat food after midnight the night before surgery.  ?No gum chewing, lozengers or hard candies. ? ?You may however, drink CLEAR liquids up to 2 hours before you are scheduled to arrive for your surgery. Do not drink anything within 2 hours of your scheduled arrival time. ? ?Clear liquids include: ?- water  ?- apple juice without pulp ?- gatorade (not RED colors) ?- black coffee or tea (Do NOT add milk or creamers to the coffee or tea) ?Do NOT drink anything that is not on this list. ? ?TAKE THESE MEDICATIONS THE MORNING OF SURGERY WITH A SIP OF WATER: ?-atorvastatin (LIPITOR) ?-metoprolol tartrate (LOPRESSOR) ? ?Stop your apixaban (ELIQUIS) 3 days prior to surgery-Last dose on 10-07-21 Thursday-Resume 14 days after surgery ? ?Use your Albuterol Sulfate (PROAIR) Inhaler the day of surgery and bring Albuterol inhaler to the hospital ? ?One week prior to surgery: ?Stop Anti-inflammatories (NSAIDS) such as Advil, Aleve, Ibuprofen, Motrin, Naproxen, Naprosyn and Aspirin based products such as Excedrin, Goodys Powder, BC Powder.You may however, take Tylenol if needed for pain up until the day of surgery. ? ?Stop ANY OVER THE COUNTER supplements/vitamins NOW (10-06-21) until after surgery (Vitamin D, BLM Capsules - sulfurzyme-Young Living Essential Oils) ? ?No Alcohol for 24 hours before or after surgery. ? ?No Smoking including e-cigarettes for 24 hours prior to surgery.  ?No chewable tobacco products for at least 6 hours  prior to surgery.  ?No nicotine patches on the day of surgery. ? ?Do not use any "recreational" drugs for at least a week prior to your surgery.  ?Please be advised that the combination of cocaine and anesthesia may have negative outcomes, up to and including death. ?If you test positive for cocaine, your surgery will be cancelled. ? ?On the morning of surgery brush your teeth with toothpaste and water, you may rinse your mouth with mouthwash if you wish. ?Do not swallow any toothpaste or mouthwash. ? ?Use CHG Soap as directed on instruction sheet. ? ?Do not wear jewelry, make-up, hairpins, clips or nail polish. ? ?Do not wear lotions, powders, or perfumes.  ? ?Do not shave body from the neck down 48 hours prior to surgery just in case you cut yourself which could leave a site for infection.  ?Also, freshly shaved skin may become irritated if using the CHG soap. ? ?Contact lenses, hearing aids and dentures may not be worn into surgery. ? ?Do not bring valuables to the hospital. [facility] is not responsible for any missing/lost belongings or valuables.  ? ?Notify your doctor if there is any change in your medical condition (cold, fever, infection). ? ?Wear comfortable clothing (specific to your surgery type) to the hospital. ? ?After surgery, you can help prevent lung complications by doing breathing exercises.  ?Take deep breaths and cough every 1-2 hours. Your doctor may order a device called an Incentive Spirometer to help you take deep breaths. ?When coughing or sneezing, hold a pillow firmly against your incision with both hands. This is called ?splinting.? Doing  this helps protect your incision. It also decreases belly discomfort. ? ?If you are being admitted to the hospital overnight, leave your suitcase in the car. ?After surgery it may be brought to your room. ? ?If you are being discharged the day of surgery, you will not be allowed to drive home. ?You will need a responsible adult (18 years or older) to  drive you home and stay with you that night.  ? ?If you are taking public transportation, you will need to have a responsible adult (18 years or older) with you. ?Please confirm with your physician that it is acceptable to use public transportation.  ? ?Please call the [place] Dept. at [phone] if you have any questions about these instructions. ? ?Surgery Visitation Policy: ? ?Patients undergoing a surgery or procedure may have two family members or support persons with them as long as the person is not COVID-19 positive or experiencing its symptoms.  ? ?Inpatient Visitation:   ? ?Visiting hours are 7 a.m. to 8 p.m. ?Up to four visitors are allowed at one time in a patient room, including children. The visitors may rotate out with other people during the day. One designated support person (adult) may remain overnight. ? ?All Areas: ?All visitors must pass COVID-19 screenings, use hand sanitizer when entering and exiting the patient?s room and wear a mask at all times, including in the patient?s room. ?Patients must also wear a mask when staff or their visitor are in the room. ?Masking is required regardless of vaccination status.  ?

## 2021-10-07 ENCOUNTER — Encounter: Payer: Self-pay | Admitting: Neurosurgery

## 2021-10-07 NOTE — Progress Notes (Addendum)
?Perioperative Services ? ?Pre-Admission/Anesthesia Testing Clinical Review ? ?Date: 10/07/21 ? ?Patient Demographics:  ?Name: [name] ?DOB:   12-Aug-1950 ?MRN:   [id number] ? ?Planned Surgical Procedure(s):  ? ? Case: [id number] Date/Time: 10/11/21 1509  ? Procedures:  ?    C6-T3 POSTERIOR FUSION ?    C6-T2 POSTERIOR SPINAL DECOMPRESSION ?    APPLICATION OF INTRAOPERATIVE CT SCAN  ? Anesthesia type: General  ? Pre-op diagnosis:  ?    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    Cervical radiculopathy M54.12 ?    Hand weakness R29.898  ? Location: [facility] OR ROOM 03 / [facility]  ? Surgeons: Meade Maw, MD  ? ?NOTE: Available PAT nursing documentation and vital signs have been reviewed. Clinical nursing staff has updated patient's PMH/PSHx, current medication list, and drug allergies/intolerances to ensure comprehensive history available to assist in medical decision making as it pertains to the aforementioned surgical procedure and anticipated anesthetic course. Extensive review of available clinical information performed. [place] PMH and PSHx updated with any diagnoses/procedures that  may have been inadvertently omitted during his intake with the pre-admission testing department's nursing staff. ? ?Clinical Discussion:  ?[name] is a 71 y.o. male who is submitted for pre-surgical anesthesia review and clearance prior to him undergoing the above procedure. Patient is a Current Smoker (20 pack years). Pertinent PMH includes: CAD, atrial fibrillation, PVD, aortic atherosclerosis, PVCs, HTN, HLD, HCV (s/p treatment), COPD, DOE, anemia, OA, cervical DDD, lumbar stenosis, ETOH abuse. ? ?Patient is followed by cardiology Garen Lah, MD). He was last seen in the cardiology clinic on 05/27/2021; notes reviewed. At the time of his clinic visit, the patient denied any chest pain, shortness of breath, PND, orthopnea, palpitations, significant peripheral edema, vertiginous  symptoms, or presyncope/syncope.  Patient with a PMH significant for cardiovascular diagnoses. ? ?Diagnostic left heart catheterization on 07/01/2015 revealed normal left ventricular systolic function and nonobstructive disease; 30% proximal to mid LAD and 20% proximal to mid RCA.  LVEDP moderately elevated at 24 mmHg.  ? ?TTE performed on 03/31/2020 revealed a mildly decreased left ventricular systolic function with an EF of 45-50%.  There was global hypokinesis.  Left ventricular diastolic parameters indeterminate.  Right ventricle mildly enlarged with mildly reduced systolic function.  Unable to assess PA pressure.  Right atrium mildly dilated.  There was trivial mitral valve regurgitation.  There was no evidence of a significant transvalvular gradient to suggest stenosis. ? ?Patient with a history of atrial fibrillation; CHA2DS2-VASc Score = 3 (age, HTN, aortic plaque).  Rate and rhythm maintained on oral metoprolol tartrate.  Patient is chronically anticoagulated using standard dose apixaban; compliant with therapy with no evidence or reports of GI bleeding.  Patient underwent DCCV procedure in 04/2020 that was unsuccessful.  Patient has been seen in consult by electrophysiology and was advised that antiarrhythmics would be considered if patient became symptomatic. Blood pressure remained elevated at 140/90 on prescribed ACEi and beta-blocker therapies.  Patient had seen his PCP approximately a week prior to his visit with cardiology and lisinopril dose had been increased.  Patient is on a statin for his HLD.  He is not diabetic.  It is important to note that patient has on a PDE5i (sildenafil) for his erectile dysfunction diagnosis. Functional capacity, as defined by DASI, is documented as being >/= 4 METS.  No changes were made to his medication regimen.  Patient to follow-up with outpatient cardiology in 6 months or sooner if needed. ? ?  [name] is scheduled for an C6-T3 POSTERIOR FUSION; C6-T2  POSTERIOR SPINAL DECOMPRESSION on 10/11/2021 with Dr. Meade Maw, MD. Given patient's past medical history significant for cardiovascular diagnoses, presurgical cardiac clearance was sought by the PAT team. Per cardiology, "based ACC/AHA guidelines, the patient's past medical history, and the amount of time since his last clinic visit, this patient would be at an overall ACCEPTABLE risk for the planned procedure without further cardiovascular testing or intervention at this time". Again, this patient is on daily anticoagulation therapy.  Neurosurgeon asking for prolonged (14 days) postoperative hold of patient's anticoagulant therapy. Cardiology concerned regarding patient's increased risk of CVA associated with patient holding his anticoagulation therapy for the requested period of time.  Per cardiology, "okay to hold apixaban for 3 days prior to procedure and restart as soon as safely possible after procedure as per surgeon. If surgeon wants to hold for 2 weeks after his procedure. This should be discussed with patient, as obviously there is risk for stroke. So long as patient has discussed this with the surgeon, understands the risk, and is willing to proceed, that should be okay". Patient is aware that his last dose of apixaban should be on 10/07/2021. ? ?Patient denies previous perioperative complications with anesthesia in the past. In review of the available records, it is noted that patient underwent a general LMA anesthetic course here (ASA III) in 10/2020 without documented complications.  ? ? ?  10/06/2021  ? 12:41 PM 08/16/2021  ?  9:00 AM 08/10/2021  ?  4:00 PM  ?Vitals with BMI  ?Height '5\' 6"'$  '5\' 6"'$    ?Weight 195 lbs 5 oz 190 lbs   ?BMI 31.54 30.68   ?Systolic 740  814  ?Diastolic 78  73  ?Pulse 96    ? ? ?Providers/Specialists:  ? ?NOTE: Primary physician provider listed below. Patient may have been seen by APP or partner within same practice.  ? ?PROVIDER ROLE / SPECIALTY LAST OV  ?Meade Maw, MD Neurosurgery (Surgeon) 09/23/2021  ?Ria Bush, MD Primary Care Provider 01/05/2021  ?Kate Sable, MD Cardiology 04/26/2021  ? ?Allergies:  ?Chantix [varenicline] ? ?Current Home Medications:  ? ?No current facility-administered medications for this encounter.  ? ? Albuterol Sulfate (PROAIR RESPICLICK) 481 (90 Base) MCG/ACT AEPB  ? apixaban (ELIQUIS) 5 MG TABS tablet  ? atorvastatin (LIPITOR) 10 MG tablet  ? Cholecalciferol 25 MCG (1000 UT) tablet  ? Fluticasone-Salmeterol (ADVAIR) 100-50 MCG/DOSE AEPB  ? gabapentin (NEURONTIN) 300 MG capsule  ? lisinopril (ZESTRIL) 20 MG tablet  ? loratadine (CLARITIN) 10 MG tablet  ? metoprolol tartrate (LOPRESSOR) 50 MG tablet  ? naproxen sodium (ALEVE) 220 MG tablet  ? OVER THE COUNTER MEDICATION  ? OVER THE COUNTER MEDICATION  ? sildenafil (VIAGRA) 50 MG tablet  ? tiotropium (SPIRIVA) 18 MCG inhalation capsule  ? traMADol (ULTRAM) 50 MG tablet  ? ?History:  ? ?Past Medical History:  ?Diagnosis Date  ? Allergic rhinitis 07/1998  ? Aortic atherosclerosis ([place]) 09/17/2016  ? Arthritis   ? BACK AND RIGHT KNEE  ? CAD (coronary artery disease) 07/01/2015  ? a.) LHC 07/01/2015: normal LV function; 30% p-mLAD, 20% p-mRCA; intervention deferred opting for medical mgmt.  ? COPD (chronic obstructive pulmonary disease) ([place]) 07/1998  ? centrilobular emphysema (09/2016) w ongoing tobacco use, spirometry (02/2013)  ? DDD (degenerative disc disease), cervical   ? DOE (dyspnea on exertion)   ? Erectile dysfunction   ? a.) on PDE5i (sildenafil)  ? ETOH abuse   ?  a.) consumes 4-5 beers on a daily basis  ? History of exposure to asbestos   ? HTN (hypertension) 05/1998  ? Hyperlipidemia 06/1996  ? Hypertensive retinopathy   ? Long term current use of anticoagulant   ? a.) apixaban  ? Lumbar spinal stenosis 03/2013  ? a.) s/p laminectomy  ? Macrocytic anemia   ? Migraines   ? Peripheral vascular disease ([place])   ? Persistent atrial fibrillation ([facility])   ? a.) CHA2DS2-VASc = 3  (age, HTN, aortic plaque). b.) rate/rhythm maintained on oral metoprolol tartrate; chronically anticoagulated with apixaban  ? 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 neg viral load - likely cleared infectio

## 2021-10-11 ENCOUNTER — Inpatient Hospital Stay: Payer: PPO | Admitting: Urgent Care

## 2021-10-11 ENCOUNTER — Other Ambulatory Visit: Payer: Self-pay

## 2021-10-11 ENCOUNTER — Inpatient Hospital Stay
Admission: RE | Admit: 2021-10-11 | Discharge: 2021-10-13 | DRG: 460 | Disposition: A | Discharge status: Home Health | Payer: PPO | Attending: Neurosurgery | Admitting: Neurosurgery

## 2021-10-11 ENCOUNTER — Inpatient Hospital Stay: Payer: PPO

## 2021-10-11 ENCOUNTER — Encounter: Payer: Self-pay | Admitting: Neurosurgery

## 2021-10-11 ENCOUNTER — Encounter
Admission: RE | Disposition: A | Discharge status: Home Health | Payer: Self-pay | Source: Home / Self Care | Attending: Neurosurgery

## 2021-10-11 DIAGNOSIS — M960 Pseudarthrosis after fusion or arthrodesis: Secondary | ICD-10-CM | POA: Diagnosis present

## 2021-10-11 DIAGNOSIS — M5412 Radiculopathy, cervical region: Secondary | ICD-10-CM | POA: Diagnosis present

## 2021-10-11 DIAGNOSIS — Z833 Family history of diabetes mellitus: Secondary | ICD-10-CM | POA: Diagnosis not present

## 2021-10-11 DIAGNOSIS — Z96651 Presence of right artificial knee joint: Secondary | ICD-10-CM | POA: Diagnosis present

## 2021-10-11 DIAGNOSIS — M4802 Spinal stenosis, cervical region: Secondary | ICD-10-CM | POA: Diagnosis present

## 2021-10-11 DIAGNOSIS — I739 Peripheral vascular disease, unspecified: Secondary | ICD-10-CM | POA: Diagnosis present

## 2021-10-11 DIAGNOSIS — Z981 Arthrodesis status: Principal | ICD-10-CM

## 2021-10-11 DIAGNOSIS — I10 Essential (primary) hypertension: Secondary | ICD-10-CM | POA: Diagnosis present

## 2021-10-11 DIAGNOSIS — Z823 Family history of stroke: Secondary | ICD-10-CM | POA: Diagnosis not present

## 2021-10-11 DIAGNOSIS — I2510 Atherosclerotic heart disease of native coronary artery without angina pectoris: Secondary | ICD-10-CM | POA: Diagnosis present

## 2021-10-11 DIAGNOSIS — J432 Centrilobular emphysema: Secondary | ICD-10-CM | POA: Diagnosis present

## 2021-10-11 DIAGNOSIS — I4819 Other persistent atrial fibrillation: Secondary | ICD-10-CM | POA: Diagnosis present

## 2021-10-11 DIAGNOSIS — E785 Hyperlipidemia, unspecified: Secondary | ICD-10-CM | POA: Diagnosis present

## 2021-10-11 DIAGNOSIS — Z6831 Body mass index (BMI) 31.0-31.9, adult: Secondary | ICD-10-CM | POA: Diagnosis not present

## 2021-10-11 DIAGNOSIS — E669 Obesity, unspecified: Secondary | ICD-10-CM | POA: Diagnosis present

## 2021-10-11 DIAGNOSIS — Z8249 Family history of ischemic heart disease and other diseases of the circulatory system: Secondary | ICD-10-CM | POA: Diagnosis not present

## 2021-10-11 DIAGNOSIS — M1990 Unspecified osteoarthritis, unspecified site: Secondary | ICD-10-CM | POA: Diagnosis present

## 2021-10-11 DIAGNOSIS — Z7901 Long term (current) use of anticoagulants: Secondary | ICD-10-CM

## 2021-10-11 DIAGNOSIS — N529 Male erectile dysfunction, unspecified: Secondary | ICD-10-CM | POA: Diagnosis present

## 2021-10-11 DIAGNOSIS — Y838 Other surgical procedures as the cause of abnormal reaction of the patient, or of later complication, without mention of misadventure at the time of the procedure: Secondary | ICD-10-CM | POA: Diagnosis present

## 2021-10-11 DIAGNOSIS — Z87891 Personal history of nicotine dependence: Secondary | ICD-10-CM

## 2021-10-11 DIAGNOSIS — Z79899 Other long term (current) drug therapy: Secondary | ICD-10-CM | POA: Diagnosis not present

## 2021-10-11 DIAGNOSIS — Z77090 Contact with and (suspected) exposure to asbestos: Secondary | ICD-10-CM | POA: Diagnosis present

## 2021-10-11 HISTORY — DX: Long term (current) use of anticoagulants: Z79.01

## 2021-10-11 HISTORY — DX: Migraine, unspecified, not intractable, without status migrainosus: G43.909

## 2021-10-11 HISTORY — DX: Contact with and (suspected) exposure to asbestos: Z77.090

## 2021-10-11 HISTORY — PX: POSTERIOR CERVICAL FUSION/FORAMINOTOMY: SHX5038

## 2021-10-11 HISTORY — DX: Other forms of dyspnea: R06.09

## 2021-10-11 HISTORY — PX: POSTERIOR CERVICAL LAMINECTOMY: SHX2248

## 2021-10-11 HISTORY — DX: Male erectile dysfunction, unspecified: N52.9

## 2021-10-11 HISTORY — DX: Hypertensive retinopathy, unspecified eye: H35.039

## 2021-10-11 HISTORY — PX: APPLICATION OF INTRAOPERATIVE CT SCAN: SHX6668

## 2021-10-11 LAB — CBC
HCT: 39.4 % (ref 39.0–52.0)
Hemoglobin: 13.4 g/dL (ref 13.0–17.0)
MCH: 33.5 pg (ref 26.0–34.0)
MCHC: 34 g/dL (ref 30.0–36.0)
MCV: 98.5 fL (ref 80.0–100.0)
Platelets: 180 10*3/uL (ref 150–400)
RBC: 4 MIL/uL — ABNORMAL LOW (ref 4.22–5.81)
RDW: 11.9 % (ref 11.5–15.5)
WBC: 10.1 10*3/uL (ref 4.0–10.5)
nRBC: 0 % (ref 0.0–0.2)

## 2021-10-11 LAB — CREATININE, SERUM
Creatinine, Ser: 1.18 mg/dL (ref 0.61–1.24)
GFR, Estimated: >60 mL/min (ref >60)

## 2021-10-11 SURGERY — POSTERIOR CERVICAL FUSION/FORAMINOTOMY LEVEL 4
Anesthesia: General | Site: Back

## 2021-10-11 MED ORDER — FAMOTIDINE 20 MG PO TABS
20.0000 mg | ORAL_TABLET | Freq: Once | ORAL | Status: AC
Start: 2021-10-11 — End: 2021-10-11

## 2021-10-11 MED ORDER — KETOROLAC TROMETHAMINE 30 MG/ML IJ SOLN
INTRAMUSCULAR | Status: AC
  Filled 2021-10-11: qty 1

## 2021-10-11 MED ORDER — PROPOFOL 10 MG/ML IV BOLUS
INTRAVENOUS | Status: DC | PRN
  Administered 2021-10-11: 150 mg via INTRAVENOUS

## 2021-10-11 MED ORDER — CHLORHEXIDINE GLUCONATE 0.12 % MT SOLN
OROMUCOSAL | Status: AC
  Administered 2021-10-11: 15 mL via OROMUCOSAL
  Filled 2021-10-11: qty 15

## 2021-10-11 MED ORDER — FENTANYL CITRATE (PF) 100 MCG/2ML IJ SOLN
INTRAMUSCULAR | Status: AC
  Administered 2021-10-11: 50 ug via INTRAVENOUS
  Filled 2021-10-11: qty 2

## 2021-10-11 MED ORDER — LACTATED RINGERS IV SOLN: INTRAVENOUS | Status: DC

## 2021-10-11 MED ORDER — METHOCARBAMOL 500 MG PO TABS
500.0000 mg | ORAL_TABLET | Freq: Four times a day (QID) | ORAL | Status: DC | PRN
  Administered 2021-10-12: 500 mg via ORAL
  Filled 2021-10-11: qty 1

## 2021-10-11 MED ORDER — BUPIVACAINE LIPOSOME 1.3 % IJ SUSP
INTRAMUSCULAR | Status: AC
  Filled 2021-10-11: qty 20

## 2021-10-11 MED ORDER — FENTANYL CITRATE (PF) 100 MCG/2ML IJ SOLN
INTRAMUSCULAR | Status: AC
  Filled 2021-10-11: qty 2

## 2021-10-11 MED ORDER — DEXAMETHASONE SODIUM PHOSPHATE 10 MG/ML IJ SOLN
INTRAMUSCULAR | Status: DC | PRN
Start: 2021-10-11 — End: 2021-10-11
  Administered 2021-10-11: 10 mg via INTRAVENOUS

## 2021-10-11 MED ORDER — OXYCODONE HCL 5 MG PO TABS: 5.0000 mg | ORAL_TABLET | ORAL | Status: DC | PRN

## 2021-10-11 MED ORDER — OXYCODONE HCL 5 MG/5ML PO SOLN: 5.0000 mg | Freq: Once | ORAL | Status: DC | PRN

## 2021-10-11 MED ORDER — PROPOFOL 1000 MG/100ML IV EMUL
INTRAVENOUS | Status: AC
  Filled 2021-10-11: qty 100

## 2021-10-11 MED ORDER — SENNA 8.6 MG PO TABS
1.0000 | ORAL_TABLET | Freq: Two times a day (BID) | ORAL | Status: DC
  Administered 2021-10-11 – 2021-10-13 (×4): 8.6 mg via ORAL
  Filled 2021-10-11 (×4): qty 1

## 2021-10-11 MED ORDER — MIDAZOLAM HCL 2 MG/2ML IJ SOLN
INTRAMUSCULAR | Status: DC | PRN
  Administered 2021-10-11: 2 mg via INTRAVENOUS

## 2021-10-11 MED ORDER — KETOROLAC TROMETHAMINE 15 MG/ML IJ SOLN
INTRAMUSCULAR | Status: AC
  Administered 2021-10-11: 7.5 mg via INTRAVENOUS
  Filled 2021-10-11: qty 1

## 2021-10-11 MED ORDER — MIDAZOLAM HCL 2 MG/2ML IJ SOLN
INTRAMUSCULAR | Status: AC
  Filled 2021-10-11: qty 2

## 2021-10-11 MED ORDER — SODIUM CHLORIDE FLUSH 0.9 % IV SOLN
INTRAVENOUS | Status: AC
  Filled 2021-10-11: qty 20

## 2021-10-11 MED ORDER — CEFAZOLIN SODIUM-DEXTROSE 2-4 GM/100ML-% IV SOLN
INTRAVENOUS | Status: AC
  Filled 2021-10-11: qty 100

## 2021-10-11 MED ORDER — LACTATED RINGERS IV SOLN: INTRAVENOUS | Status: DC | PRN

## 2021-10-11 MED ORDER — CHLORHEXIDINE GLUCONATE 0.12 % MT SOLN: 15.0000 mL | Freq: Once | OROMUCOSAL | Status: AC

## 2021-10-11 MED ORDER — PHENYLEPHRINE 40 MCG/ML (10ML) SYRINGE FOR IV PUSH (FOR BLOOD PRESSURE SUPPORT)
PREFILLED_SYRINGE | INTRAVENOUS | Status: DC | PRN
  Administered 2021-10-11: 80 ug via INTRAVENOUS

## 2021-10-11 MED ORDER — METHOCARBAMOL 1000 MG/10ML IJ SOLN
500.0000 mg | Freq: Four times a day (QID) | INTRAVENOUS | Status: DC | PRN
  Administered 2021-10-11: 500 mg via INTRAVENOUS
  Filled 2021-10-11: qty 500

## 2021-10-11 MED ORDER — MENTHOL 3 MG MT LOZG: 1.0000 | LOZENGE | OROMUCOSAL | Status: DC | PRN

## 2021-10-11 MED ORDER — ONDANSETRON HCL 4 MG/2ML IJ SOLN
INTRAMUSCULAR | Status: DC | PRN
  Administered 2021-10-11: 4 mg via INTRAVENOUS

## 2021-10-11 MED ORDER — FENTANYL CITRATE (PF) 100 MCG/2ML IJ SOLN
25.0000 ug | INTRAMUSCULAR | Status: DC | PRN
  Administered 2021-10-11: 50 ug via INTRAVENOUS

## 2021-10-11 MED ORDER — MORPHINE SULFATE (PF) 2 MG/ML IV SOLN: 2.0000 mg | INTRAVENOUS | Status: DC | PRN

## 2021-10-11 MED ORDER — BUPIVACAINE-EPINEPHRINE (PF) 0.5% -1:200000 IJ SOLN
INTRAMUSCULAR | Status: DC | PRN
  Administered 2021-10-11: 6 mL

## 2021-10-11 MED ORDER — BUPIVACAINE HCL (PF) 0.5 % IJ SOLN
INTRAMUSCULAR | Status: AC
  Filled 2021-10-11: qty 30

## 2021-10-11 MED ORDER — PROPOFOL 10 MG/ML IV BOLUS
INTRAVENOUS | Status: AC
  Filled 2021-10-11: qty 20

## 2021-10-11 MED ORDER — BISACODYL 10 MG RE SUPP: 10.0000 mg | Freq: Every day | RECTAL | Status: DC | PRN

## 2021-10-11 MED ORDER — FAMOTIDINE 20 MG PO TABS
ORAL_TABLET | ORAL | Status: AC
  Administered 2021-10-11: 20 mg via ORAL
  Filled 2021-10-11: qty 1

## 2021-10-11 MED ORDER — ALBUTEROL SULFATE (2.5 MG/3ML) 0.083% IN NEBU: 3.0000 mL | INHALATION_SOLUTION | Freq: Four times a day (QID) | RESPIRATORY_TRACT | Status: DC | PRN

## 2021-10-11 MED ORDER — ACETAMINOPHEN 10 MG/ML IV SOLN: 1000.0000 mg | Freq: Once | INTRAVENOUS | Status: DC | PRN

## 2021-10-11 MED ORDER — LIDOCAINE HCL (CARDIAC) PF 100 MG/5ML IV SOSY
PREFILLED_SYRINGE | INTRAVENOUS | Status: DC | PRN
  Administered 2021-10-11: 100 mg via INTRAVENOUS

## 2021-10-11 MED ORDER — KETOROLAC TROMETHAMINE 15 MG/ML IJ SOLN
7.5000 mg | Freq: Four times a day (QID) | INTRAMUSCULAR | Status: AC
  Administered 2021-10-12 (×3): 7.5 mg via INTRAVENOUS
  Filled 2021-10-11 (×3): qty 1

## 2021-10-11 MED ORDER — OXYCODONE HCL 5 MG PO TABS
10.0000 mg | ORAL_TABLET | ORAL | Status: DC | PRN
  Administered 2021-10-12 – 2021-10-13 (×6): 10 mg via ORAL
  Filled 2021-10-11 (×6): qty 2

## 2021-10-11 MED ORDER — PROPOFOL 500 MG/50ML IV EMUL
INTRAVENOUS | Status: DC | PRN
  Administered 2021-10-11: 150 ug/kg/min via INTRAVENOUS

## 2021-10-11 MED ORDER — SUCCINYLCHOLINE CHLORIDE 200 MG/10ML IV SOSY
PREFILLED_SYRINGE | INTRAVENOUS | Status: DC | PRN
  Administered 2021-10-11: 120 mg via INTRAVENOUS

## 2021-10-11 MED ORDER — CEFAZOLIN SODIUM-DEXTROSE 2-4 GM/100ML-% IV SOLN
2.0000 g | Freq: Once | INTRAVENOUS | Status: AC
  Administered 2021-10-11: 2 g via INTRAVENOUS

## 2021-10-11 MED ORDER — KETOROLAC TROMETHAMINE 30 MG/ML IJ SOLN
INTRAMUSCULAR | Status: DC | PRN
  Administered 2021-10-11: 15 mg via INTRAVENOUS

## 2021-10-11 MED ORDER — SURGIFLO WITH THROMBIN (HEMOSTATIC MATRIX KIT) OPTIME
TOPICAL | Status: DC | PRN
  Administered 2021-10-11: 1 via TOPICAL

## 2021-10-11 MED ORDER — SODIUM CHLORIDE 0.9 % IV SOLN: 250.0000 mL | INTRAVENOUS | Status: DC

## 2021-10-11 MED ORDER — ACETAMINOPHEN 500 MG PO TABS
1000.0000 mg | ORAL_TABLET | Freq: Four times a day (QID) | ORAL | Status: AC
  Administered 2021-10-12 (×4): 1000 mg via ORAL
  Filled 2021-10-11 (×4): qty 2

## 2021-10-11 MED ORDER — POLYETHYLENE GLYCOL 3350 17 G PO PACK: 17.0000 g | PACK | Freq: Every day | ORAL | Status: DC | PRN

## 2021-10-11 MED ORDER — PHENYLEPHRINE HCL-NACL 20-0.9 MG/250ML-% IV SOLN
INTRAVENOUS | Status: DC | PRN
Start: 2021-10-11 — End: 2021-10-11
  Administered 2021-10-11: 30 ug/min via INTRAVENOUS

## 2021-10-11 MED ORDER — SODIUM CHLORIDE FLUSH 0.9 % IV SOLN
INTRAVENOUS | Status: AC
  Filled 2021-10-11: qty 10

## 2021-10-11 MED ORDER — OXYCODONE HCL 5 MG PO TABS: 5.0000 mg | ORAL_TABLET | Freq: Once | ORAL | Status: DC | PRN

## 2021-10-11 MED ORDER — ACETAMINOPHEN 10 MG/ML IV SOLN
INTRAVENOUS | Status: DC | PRN
  Administered 2021-10-11: 1000 mg via INTRAVENOUS

## 2021-10-11 MED ORDER — LISINOPRIL 20 MG PO TABS
20.0000 mg | ORAL_TABLET | ORAL | Status: DC
Start: 2021-10-12 — End: 2021-10-13
  Administered 2021-10-12 – 2021-10-13 (×2): 20 mg via ORAL
  Filled 2021-10-11 (×2): qty 1

## 2021-10-11 MED ORDER — PRONTOSAN WOUND IRRIGATION OPTIME
TOPICAL | Status: DC | PRN
  Administered 2021-10-11: 1

## 2021-10-11 MED ORDER — PHENOL 1.4 % MT LIQD: 1.0000 | OROMUCOSAL | Status: DC | PRN

## 2021-10-11 MED ORDER — ENOXAPARIN SODIUM 40 MG/0.4ML IJ SOSY
40.0000 mg | PREFILLED_SYRINGE | INTRAMUSCULAR | Status: DC
  Administered 2021-10-12 – 2021-10-13 (×2): 40 mg via SUBCUTANEOUS
  Filled 2021-10-11 (×2): qty 0.4

## 2021-10-11 MED ORDER — SODIUM CHLORIDE 0.9% FLUSH: 3.0000 mL | INTRAVENOUS | Status: DC | PRN

## 2021-10-11 MED ORDER — FENTANYL CITRATE (PF) 100 MCG/2ML IJ SOLN
INTRAMUSCULAR | Status: DC | PRN
  Administered 2021-10-11: 100 ug via INTRAVENOUS
  Administered 2021-10-11 (×3): 25 ug via INTRAVENOUS

## 2021-10-11 MED ORDER — ONDANSETRON HCL 4 MG/2ML IJ SOLN: 4.0000 mg | Freq: Once | INTRAMUSCULAR | Status: DC | PRN

## 2021-10-11 MED ORDER — BUPIVACAINE-EPINEPHRINE (PF) 0.5% -1:200000 IJ SOLN
INTRAMUSCULAR | Status: AC
  Filled 2021-10-11: qty 30

## 2021-10-11 MED ORDER — ORAL CARE MOUTH RINSE: 15.0000 mL | Freq: Once | OROMUCOSAL | Status: AC

## 2021-10-11 MED ORDER — SUCCINYLCHOLINE CHLORIDE 200 MG/10ML IV SOSY
PREFILLED_SYRINGE | INTRAVENOUS | Status: AC
  Filled 2021-10-11: qty 20

## 2021-10-11 MED ORDER — ATORVASTATIN CALCIUM 10 MG PO TABS
10.0000 mg | ORAL_TABLET | ORAL | Status: DC
  Administered 2021-10-12 – 2021-10-13 (×2): 10 mg via ORAL
  Filled 2021-10-11 (×2): qty 1

## 2021-10-11 MED ORDER — FLEET ENEMA 7-19 GM/118ML RE ENEM: 1.0000 | ENEMA | Freq: Once | RECTAL | Status: DC | PRN

## 2021-10-11 MED ORDER — REMIFENTANIL HCL 1 MG IV SOLR
INTRAVENOUS | Status: AC
  Filled 2021-10-11: qty 1000

## 2021-10-11 MED ORDER — ONDANSETRON HCL 4 MG PO TABS: 4.0000 mg | ORAL_TABLET | Freq: Four times a day (QID) | ORAL | Status: DC | PRN

## 2021-10-11 MED ORDER — ONDANSETRON HCL 4 MG/2ML IJ SOLN
INTRAMUSCULAR | Status: AC
  Filled 2021-10-11: qty 2

## 2021-10-11 MED ORDER — TIOTROPIUM BROMIDE MONOHYDRATE 18 MCG IN CAPS: 18.0000 ug | ORAL_CAPSULE | RESPIRATORY_TRACT | Status: DC | PRN

## 2021-10-11 MED ORDER — FLUTICASONE FUROATE-VILANTEROL 100-25 MCG/ACT IN AEPB
1.0000 | INHALATION_SPRAY | Freq: Every day | RESPIRATORY_TRACT | Status: DC
  Filled 2021-10-11: qty 28

## 2021-10-11 MED ORDER — REMIFENTANIL HCL 1 MG IV SOLR
INTRAVENOUS | Status: DC | PRN
Start: 2021-10-11 — End: 2021-10-11
  Administered 2021-10-11: .1 ug/kg/min via INTRAVENOUS

## 2021-10-11 MED ORDER — SODIUM CHLORIDE (PF) 0.9 % IJ SOLN
INTRAMUSCULAR | Status: DC | PRN
  Administered 2021-10-11: 60 mL

## 2021-10-11 MED ORDER — SODIUM CHLORIDE 0.9% FLUSH
3.0000 mL | Freq: Two times a day (BID) | INTRAVENOUS | Status: DC
  Administered 2021-10-11 – 2021-10-13 (×2): 3 mL via INTRAVENOUS

## 2021-10-11 MED ORDER — 0.9 % SODIUM CHLORIDE (POUR BTL) OPTIME
TOPICAL | Status: DC | PRN
  Administered 2021-10-11: 1000 mL

## 2021-10-11 MED ORDER — DEXAMETHASONE SODIUM PHOSPHATE 10 MG/ML IJ SOLN
INTRAMUSCULAR | Status: AC
  Filled 2021-10-11: qty 1

## 2021-10-11 MED ORDER — VITAMIN D 25 MCG (1000 UNIT) PO TABS
1000.0000 [IU] | ORAL_TABLET | Freq: Every day | ORAL | Status: DC
  Administered 2021-10-12 – 2021-10-13 (×2): 1000 [IU] via ORAL
  Filled 2021-10-11 (×2): qty 1

## 2021-10-11 MED ORDER — ONDANSETRON HCL 4 MG/2ML IJ SOLN: 4.0000 mg | Freq: Four times a day (QID) | INTRAMUSCULAR | Status: DC | PRN

## 2021-10-11 MED ORDER — DOCUSATE SODIUM 100 MG PO CAPS
100.0000 mg | ORAL_CAPSULE | Freq: Two times a day (BID) | ORAL | Status: DC
  Administered 2021-10-11 – 2021-10-13 (×4): 100 mg via ORAL
  Filled 2021-10-11 (×4): qty 1

## 2021-10-11 MED ORDER — DIPHENHYDRAMINE HCL 50 MG/ML IJ SOLN
INTRAMUSCULAR | Status: AC
  Filled 2021-10-11: qty 1

## 2021-10-11 MED ORDER — LIDOCAINE HCL (PF) 2 % IJ SOLN
INTRAMUSCULAR | Status: AC
  Filled 2021-10-11: qty 5

## 2021-10-11 MED ORDER — SODIUM CHLORIDE 0.9 % IV SOLN: INTRAVENOUS | Status: DC

## 2021-10-11 SURGICAL SUPPLY — 78 items
BIT DRILLSCRW 3.5 (BIT) ×1 IMPLANT
BLADE BOVIE TIP EXT 4 (BLADE) ×1 IMPLANT
BLADE SURG 15 STRL LF DISP TIS (BLADE) ×2 IMPLANT
BLADE SURG 15 STRL SS (BLADE) ×3
BULB RESERV EVAC DRAIN JP 100C (MISCELLANEOUS) IMPLANT
BUR NEURO DRILL SOFT 3.0X3.8M (BURR) ×2 IMPLANT
CAP LOCKING (Cap) IMPLANT
CHLORAPREP W/TINT 26 (MISCELLANEOUS) ×4 IMPLANT
CONNECTOR LATERAL LONG (Connector) ×1 IMPLANT
COUNTER NEEDLE 20/40 LG (NEEDLE) ×3 IMPLANT
CUP MEDICINE 2OZ PLAST GRAD ST (MISCELLANEOUS) ×6 IMPLANT
DERMABOND ADVANCED (GAUZE/BANDAGES/DRESSINGS) ×1
DERMABOND ADVANCED .7 DNX12 (GAUZE/BANDAGES/DRESSINGS) ×2 IMPLANT
DRAIN CHANNEL JP 10F RND 20C F (MISCELLANEOUS) IMPLANT
DRAPE 3D C-ARM OEC (DRAPES) ×1 IMPLANT
DRAPE C ARM PK CFD 31 SPINE (DRAPES) ×2 IMPLANT
DRAPE C-ARMOR (DRAPES) IMPLANT
DRAPE LAPAROTOMY 100X77 ABD (DRAPES) ×3 IMPLANT
DRAPE MICROSCOPE SPINE 48X150 (DRAPES) IMPLANT
DRAPE SCAN PATIENT (DRAPES) ×3 IMPLANT
DRAPE SURG 17X11 SM STRL (DRAPES) ×10 IMPLANT
ELECT CAUTERY BLADE TIP 2.5 (TIP) ×3
ELECTRODE CAUTERY BLDE TIP 2.5 (TIP) ×2 IMPLANT
EX-PIN ORTHOLOCK NAV 4X150 (PIN) IMPLANT
FEE INTRAOP CADWELL SUPPLY NCS (MISCELLANEOUS) ×2 IMPLANT
FEE INTRAOP MONITOR IMPULS NCS (MISCELLANEOUS) IMPLANT
GAUZE 4X4 16PLY ~~LOC~~+RFID DBL (SPONGE) ×3 IMPLANT
GAUZE SPONGE 4X4 12PLY STRL (GAUZE/BANDAGES/DRESSINGS) ×3 IMPLANT
GLOVE SURG SYN 6.5 ES PF (GLOVE) ×9 IMPLANT
GLOVE SURG SYN 6.5 PF PI (GLOVE) ×2 IMPLANT
GLOVE SURG SYN 8.5  E (GLOVE) ×9
GLOVE SURG SYN 8.5 E (GLOVE) ×6 IMPLANT
GLOVE SURG SYN 8.5 PF PI (GLOVE) ×6 IMPLANT
GLOVE SURG UNDER POLY LF SZ6.5 (GLOVE) ×4 IMPLANT
GOWN SRG LRG LVL 4 IMPRV REINF (GOWNS) ×2 IMPLANT
GOWN SRG XL LVL 3 NONREINFORCE (GOWNS) ×2 IMPLANT
GOWN STRL NON-REIN TWL XL LVL3 (GOWNS) ×3
GOWN STRL REIN LRG LVL4 (GOWNS) ×3
GRADUATE 1200CC STRL 31836 (MISCELLANEOUS) ×3 IMPLANT
HEMOVAC 400CC 10FR (MISCELLANEOUS) ×1 IMPLANT
HOLDER FOLEY CATH W/STRAP (MISCELLANEOUS) ×1 IMPLANT
IMPL QUARTEX 3.5X16MM (Neuro Prosthesis/Implant) IMPLANT
IMPLANT QUARTEX 3.5X16MM (Neuro Prosthesis/Implant) ×6 IMPLANT
INTRAOP CADWELL SUPPLY FEE NCS (MISCELLANEOUS)
INTRAOP DISP SUPPLY FEE NCS (MISCELLANEOUS)
INTRAOP MONITOR FEE IMPULS NCS (MISCELLANEOUS)
INTRAOP MONITOR FEE IMPULSE (MISCELLANEOUS)
KIT INFUSE LRG II (Orthopedic Implant) ×1 IMPLANT
KIT PREVENA INCISION MGT 13 (CANNISTER) ×1 IMPLANT
LOCKING CAP (Cap) ×24 IMPLANT
MANIFOLD NEPTUNE II (INSTRUMENTS) ×3 IMPLANT
MARKER SKIN DUAL TIP RULER LAB (MISCELLANEOUS) ×5 IMPLANT
MARKER SPHERE PSV REFLC 13MM (MARKER) ×23 IMPLANT
NDL SPNL 18GX3.5 QUINCKE PK (NEEDLE) ×6 IMPLANT
NEEDLE HYPO 22GX1.5 SAFETY (NEEDLE) ×3 IMPLANT
NEEDLE SPNL 18GX3.5 QUINCKE PK (NEEDLE) ×3 IMPLANT
NS IRRIG 1000ML POUR BTL (IV SOLUTION) ×3 IMPLANT
PACK LAMINECTOMY NEURO (CUSTOM PROCEDURE TRAY) ×3 IMPLANT
PAD ARMBOARD 7.5X6 YLW CONV (MISCELLANEOUS) ×7 IMPLANT
PUTTY DBM PROPEL LRG (Putty) ×1 IMPLANT
ROD SPINE POST 3.5X80 (Rod) ×2 IMPLANT
SCREW QUARTEX 4.0X26 (Screw) ×2 IMPLANT
SCREW QUARTEX 5.0X30 (Screw) ×4 IMPLANT
SOLUTION PRONTOSAN WOUND 350ML (IRRIGATION / IRRIGATOR) ×1 IMPLANT
STAPLER SKIN PROX 35W (STAPLE) ×6 IMPLANT
SURGIFLO W/THROMBIN 8M KIT (HEMOSTASIS) ×3 IMPLANT
SUT ETHILON 3-0 FS-10 30 BLK (SUTURE) ×3
SUT V-LOC 90 ABS DVC 3-0 CL (SUTURE) ×3 IMPLANT
SUT VIC AB 0 CT1 27 (SUTURE) ×6
SUT VIC AB 0 CT1 27XCR 8 STRN (SUTURE) ×6 IMPLANT
SUT VIC AB 2-0 CT1 18 (SUTURE) ×5 IMPLANT
SUTURE EHLN 3-0 FS-10 30 BLK (SUTURE) IMPLANT
SYR 30ML LL (SYRINGE) ×9 IMPLANT
TAPE CLOTH 3X10 WHT NS LF (GAUZE/BANDAGES/DRESSINGS) ×6 IMPLANT
TOWEL OR 17X26 4PK STRL BLUE (TOWEL DISPOSABLE) ×12 IMPLANT
TRAY FOLEY MTR SLVR 16FR STAT (SET/KITS/TRAYS/PACK) ×1 IMPLANT
TUBING CONNECTING 10 (TUBING) ×3 IMPLANT
WATER STERILE IRR 500ML POUR (IV SOLUTION) ×2 IMPLANT

## 2021-10-11 NOTE — Anesthesia Preprocedure Evaluation (Addendum)
Anesthesia Evaluation  ?Patient identified by MRN, date of birth, ID band ?Patient awake ? ? ? ?Reviewed: ?Allergy & Precautions, NPO status , Patient's Chart, lab work & pertinent test results ? ?History of Anesthesia Complications ?Negative for: history of anesthetic complications ? ?Airway ?Mallampati: III ? ? ?Neck ROM: Full ? ? ? Dental ? ? ?Missing molars:   ?Pulmonary ?COPD, Current Smoker and Patient abstained from smoking., former smoker (quit 3 weeks ago),  ?  ?Pulmonary exam normal ?breath sounds clear to auscultation ? ? ? ? ? ? Cardiovascular ?hypertension, + CAD and + Peripheral Vascular Disease  ?+ dysrhythmias (a fib on Eliquis)  ?Rhythm:Irregular Rate:Normal ? ?ECG 05/27/21: a fib, HR 92 ? ?Echo 03/31/20: Mildly decreased left ventricular systolic function with an EF of 45-50%.  There was global hypokinesis.  Left ventricular diastolic parameters indeterminate.  Right ventricle mildly enlarged with mildly reduced systolic function.  Unable to assess PA pressure.  Right atrium mildly dilated.  There was trivial mitral valve regurgitation.  There was no evidence of a significant transvalvular gradient to suggest stenosis. ?  ?Neuro/Psych ? Headaches, Alcohol use disorder, 4-5 beers daily, last intake 3 days ago; retinopathy; left eye vision loss due to growth (early in workup) ?  ? GI/Hepatic ?negative GI ROS,   ?Endo/Other  ?Obesity  ? Renal/GU ?negative Renal ROS  ? ?  ?Musculoskeletal ? ?(+) Arthritis ,  ? Abdominal ?  ?Peds ? Hematology ? ?(+) Blood dyscrasia, anemia ,   ?Anesthesia Other Findings ?Reviewed and agree with Bayard Males pre-anesthesia clinical review note. ? ?Cardiology note 05/27/21:  ?ASSESSMENT:  ?1. Persistent atrial fibrillation ([place])  ?2. Coronary artery disease involving native coronary artery of native heart without angina pectoris  ?3. Primary hypertension  ?4. Smoking  ?? ?PLAN:  ?In order of problems listed above: ?1. Patient with  persistent atrial fibrillation.  CHA2DS2-VASc score 3(age, htn, vasc).  Clinically asymptomatic, echo with low normal EF of about 50%.  failed DCCV on 05/06/2020.  Continue Lopressor 50 mg twice daily, Eliquis 5 mg twice daily. ?2. nonobstructive CAD (30% mLAD, 20% mRCA).  Asymptomatic. Continue Eliquis, Lipitor.  LDL at goal. ?3. Hypertension, BP elevated.  Lisinopril recently increased to 20 mg daily.  Continue to monitor, low-salt diet advised, titrate lisinopril for adequate BP control. ?4. Current smoker, cessation recommended. ?? ?Follow-up in 6 months ? ? Reproductive/Obstetrics ? ?  ? ? ? ? ? ? ? ? ? ? ? ? ? ?  ?  ? ? ? ? ? ? ?Anesthesia Physical ?Anesthesia Plan ? ?ASA: 3 ? ?Anesthesia Plan: General  ? ?Post-op Pain Management:   ? ?Induction: Intravenous ? ?PONV Risk Score and Plan: 1 and Ondansetron, Dexamethasone and Treatment may vary due to age or medical condition ? ?Airway Management Planned: Oral ETT ? ?Additional Equipment: Arterial line ? ?Intra-op Plan:  ? ?Post-operative Plan: Extubation in OR ? ?Informed Consent: I have reviewed the patients History and Physical, chart, labs and discussed the procedure including the risks, benefits and alternatives for the proposed anesthesia with the patient or authorized representative who has indicated his/her understanding and acceptance.  ? ? ? ?Dental advisory given ? ?Plan Discussed with: CRNA ? ?Anesthesia Plan Comments: (Patient consented for risks of anesthesia including but not limited to:  ?- adverse reactions to medications ?- damage to eyes, teeth, lips or other oral mucosa, including vision loss 2/2 prone positioning ?- nerve damage due to positioning  ?- sore throat or hoarseness ?- damage to  heart, brain, nerves, lungs, other parts of body or loss of life ? ?Informed patient about role of CRNA in peri- and intra-operative care.  Patient voiced understanding.)  ? ? ? ? ?Anesthesia Quick Evaluation ? ?

## 2021-10-11 NOTE — Progress Notes (Signed)
Patient awake/alert x4. Posterior prevena intact, x1 hemovac with 2x2 dressing over, small amount bleeding around site, reinforced with 2x2. Indwelling foley intact, cloudy urine. ?Patient moving x4 ext, pulses intact x4. PIV x2, left radial a-line d/c'd upon arrival to pacu, noted bruising at site, patient is on eliquis. Pulses intact radial/ulnar, cap refill <3 sec.  ?Patient verbalizes understanding of procedure, plan of care, family visited in [place], updated.  ?Accepting nurse made aware of dressing to left radial area, to continue to monitor closely.  ?Toleated ice chips while in pacu.  Pain controlled up transfer to floor.  ?

## 2021-10-11 NOTE — Transfer of Care (Signed)
Immediate Anesthesia Transfer of Care Note ? ?Patient: [name] ? ?Procedure(s) Performed: C6-T3 POSTERIOR FUSION (Back) ?C6-T2 POSTERIOR SPINAL DECOMPRESSION (Back) ?APPLICATION OF INTRAOPERATIVE CT SCAN ? ?Patient Location: PACU ? ?Anesthesia Type:General ? ?Level of Consciousness: sedated and patient cooperative ? ?Airway & Oxygen Therapy: Patient Spontanous Breathing and Patient connected to face mask oxygen ? ?Post-op Assessment: Report given to RN and Post -op Vital signs reviewed and stable ? ?Post vital signs: Reviewed and stable ? ?Last Vitals:  ?Vitals Value Taken Time  ?BP 114/92 10/11/21 2004  ?Temp    ?Pulse 79 10/11/21 2010  ?Resp 13 10/11/21 2010  ?SpO2 100 % 10/11/21 2010  ?Vitals shown include unvalidated device data. ? ?Last Pain:  ?Vitals:  ? 10/11/21 1331  ?TempSrc: Oral  ?   ? ?  ? ?Complications: No notable events documented. ?

## 2021-10-11 NOTE — Consult Note (Signed)
Pharmacy Antibiotic Note ? ?[name] is a 71 y.o. y.o. male admitted on 10/11/21 for C6-T3 fusion.  Pharmacy has been consulted for ancef dosing for surgical prophylaxis. ? ?Serum creatinine: 0.89 mg/dL 10/06/21 1341 ?Estimated creatinine clearance: 80.5 mL/min  ? ?Weight: 88.6 kg  ? ?Allergies  ?Allergen Reactions  ? Chantix [Varenicline] Other (See Comments)  ?  headaches  ?  ? ?Plan: ?Ancef 2 g IV once in pre-op  ? ?Thank you for allowing pharmacy to be a part of this patient?s care. ? ?Darnelle Bos, PharmD ?10/11/21 1:44 PM ? ?

## 2021-10-11 NOTE — Op Note (Signed)
Indications: 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 ?Due to worsening weakness, surgery was recommended ? ?Findings: neuroforaminal stenosis at C6-7, C7-T1, and T1-2 ? 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 ? ?EBL: 200 ml ?IVF: see AR ml ?Drains: 1 placed ?Disposition: Extubated and Stable to PACU ?Complications: none ? ?A foley catheter was placed. ? ? ?Preoperative Note:  ? ?Risks of surgery discussed include: infection, bleeding, stroke, coma, death, paralysis, CSF leak, nerve/spinal cord injury, numbness, tingling, weakness, complex regional pain syndrome, recurrent stenosis and/or disc herniation, vascular injury, development of instability, neck/back pain, need for further surgery, persistent symptoms, development of deformity, and the risks of anesthesia. The patient understood these risks and agreed to proceed. ? ?Operative Note:  ? ?OPERATIVE PROCEDURE:  ?1. Posterior Segmental Instrumentation C6-T3 using Globus Quartex ?2. Posterolateral arthrodesis from C6-T3 ?3. Cervical foraminotomies from C6-7 to T1-2 ?4. Harvesting of autograft via the same incision ?5. Use of flouroscopy ?6. Use of stereotaxis ? ?OPERATIVE PROCEDURE:  After induction of general anesthesia, the patient was placed in the prone position on the [place] table and open frame. A midline incision was then planned using fluoroscopy.  A timeout was performed, and antibiotics given. ? ?Next, the posterior cervical region was prepped and draped in the usual sterile fashion. The incision was injected with local anesthetic, the opened sharply. A subperiosteal dissection was then carried out to expose the posterior elements from C6-T3.  After adequate exposure had been achieved, the stereotactic array was placed at  T2.  We then acquired stereotactic imaging for image guidance and placement of instrumentation. ? ?Utilizing the BrainLab image guidance system, the pedicles at C7, T2, and T3 were cannulated bilaterally.  5.0 x 30 mm screws were placed at T2 and T3 bilaterally.  At C7, 4.0 x 26 mm screws were placed.  We then placed lateral mass screws at C6 bilaterally with a 3.5 x 16 mm screw placed using modified Micrell technique. ? ?After instrumentation was placed, the high-speed drill was used to remove the left C6-7, C7-T1, and T1 to facets completely.  The left C7, C8, and T1 nerve roots were completely unroofed using curettes and 1 and 2 mm Kerrison punches until no compression was noted.  After these nerve roots were fully decompressed, we placed rods on either side.  Stereotactic CT was then performed to confirm instrumentation placement. ? ?On the initial CT scan, the left T2 and T3 screws were noted to be slightly lateral.  These were removed and replaced in a more medial trajectory.  The rods were then measured and secured to manufacturer specifications. ? ? ?The wound was copiously irrigated with bacitracin-containing solution and hemostasis was achieved.  Using high-speed drill, the posterolateral elements were decorticated from C6 to T3.  Infuse and allograft were then placed for arthrodesis. ? ?A Hemovac drain was then placed in the wound deep to the fascia.  ? ?The wound was closed in a multilayer fashion using interrupted 0 and 2-0 Vicryl sutures.  The final skin edges were reapproximated using a 3-0 Vicryl.  After closure, the patient was flipped supine and the [place] removed.  Patient was then handed back over to anesthesia. ? ?All counts were correct at the conclusion of the procedure.  Neurological monitoring was used throughout, and there were no changes. ? ?Pedro Render PA acted as an  Pensions consultant throughout the case.  ? ?Pedro Maw MD  ?

## 2021-10-11 NOTE — H&P (Signed)
I have reviewed and confirmed my history and physical from 09/23/2021 with no additions or changes. Plan for C6-T3 PSF with C6-T2 decompression.  Risks and benefits reviewed. ? ?Heart sounds normal no MRG. Chest Clear to Auscultation Bilaterally. ? ? ?  ? ?

## 2021-10-11 NOTE — Anesthesia Procedure Notes (Signed)
Procedure Name: Intubation ?Date/Time: 10/11/2021 3:52 PM ?Performed by: Lily Peer, Emillio Ngo, CRNA ?Pre-anesthesia Checklist: Patient identified, Emergency Drugs available, Suction available and Patient being monitored ?Patient Re-evaluated:Patient Re-evaluated prior to induction ?Oxygen Delivery Method: Circle system utilized ?Preoxygenation: Pre-oxygenation with 100% oxygen ?Induction Type: IV induction ?Ventilation: Mask ventilation without difficulty ?Laryngoscope Size: McGraph and 3 ?Grade View: Grade I ?Tube type: Oral ?Number of attempts: 1 ?Airway Equipment and Method: Stylet ?Placement Confirmation: ETT inserted through vocal cords under direct vision, positive ETCO2 and breath sounds checked- equal and bilateral ?Secured at: 21 cm ?Tube secured with: Tape ?Dental Injury: Teeth and Oropharynx as per pre-operative assessment  ? ? ? ? ?

## 2021-10-11 NOTE — Anesthesia Procedure Notes (Signed)
Arterial Line Insertion ?Start/End3/27/2023 5:00 PM, 10/11/2021 5:00 PM ?Performed by: Iran Ouch, MD, Jonna Clark, CRNA, CRNA ? Patient location: Pre-op. ?Preanesthetic checklist: patient identified, IV checked, risks and benefits discussed, surgical consent, monitors and equipment checked, pre-op evaluation, timeout performed and anesthesia consent ?Left, radial was placed ?Catheter size: 20 G ?Hand hygiene performed  and maximum sterile barriers used  ? ?Attempts: 1 ?Procedure performed without using ultrasound guided technique. ?Following insertion, dressing applied. ?Post procedure assessment: normal and unchanged ? ?Patient tolerated the procedure well with no immediate complications. ? ? ?

## 2021-10-12 NOTE — Discharge Instructions (Addendum)
NEUROSURGERY DISCHARGE INSTRUCTIONS ? ?Admission diagnosis: S/P cervical spinal fusion [Z98.1] ? ?Operative procedure: C6-T3 PSF. C6-7 and T1-2 foraminotomies. ? ?What to do after you leave the hospital: ? ?Recommended diet: regular diet. Increase protein intake to promote wound healing. ? ?Recommended activity: no lifting, driving, or strenuous exercise for 4 weeks . You should walk multiple times per day ? ?Special Instructions ? ?No straining, no heavy lifting > 10lbs x 4 weeks.  Keep incision area clean and dry. May shower in 2 days. No baths or pools for 6 weeks.  ?Please remove dressing tomorrow, no need to apply a bandage afterwards ? ?You have no sutures to remove ? ?Please take pain medications as directed. Take a stool softener if on pain medications ? ? ?Please Report any of the following: ?Nausea or Vomiting, Temperature is greater than 101.47F (38.1C) degrees, Dizziness, Abdominal Pain, Difficulty Breathing or Shortness of Breath, Inability to Eat, drink Fluids, or Take medications, Bleeding, swelling, or drainage from surgical incision sites, New numbness or weakness, and Bowel or bladder dysfunction to the neurosurgeon on call at [phone] ? ?Additional Follow up appointments ?Please follow up with Cooper Render PA-C in [place] clinic as scheduled in 2-3 weeks ? ? ?Please see below for scheduled appointments: ? ?Future Appointments  ?Date Time Provider [place]  ?11/25/2021 10:00 AM Kate Sable, MD [facility]  ?01/05/2022  9:00 AM [facility]  ?01/12/2022 10:00 AM Ria Bush, MD [facility]  ?02/08/2022  9:00 AM [facility] PHARMACIST [facility]  ? ? ?  ?

## 2021-10-12 NOTE — Progress Notes (Signed)
? ?   Attending Progress Note ? ?History: [name] is a 71 y.o s/p C6-T3 PSF. C6-7 and T1-2 foraminotomies. ? ?POD1: Continued left finger weakness and neck pain. Reports improved left arm pain  ? ?Physical Exam: ?Vitals:  ? 10/12/21 0356 10/12/21 0814  ?BP: (!) 145/84 134/80  ?Pulse: 87 93  ?Resp: 16 16  ?Temp: 98.4 ?F (36.9 ?C) 98.4 ?F (36.9 ?C)  ?SpO2: 97% 98%  ? ? ?AA Ox3 ?CNI ? ?Strength:5/5 throughout RUE and LUE except 2/5 left IO, wrist extension, and finger extension.  ? ?HV 100 since surgery ?Wound vac in place.  ? ?Data: ? ?Recent Labs  ?Lab 10/06/21 ?1341 10/11/21 ?2157  ?NA 137  --   ?K 4.7  --   ?CL 104  --   ?CO2 29  --   ?BUN 16  --   ?CREATININE 0.89 1.18  ?GLUCOSE 117*  --   ?CALCIUM 9.1  --   ? ?No results for input(s): AST, ALT, ALKPHOS in the last 168 hours. ? ?Invalid input(s): TBILI  ? Recent Labs  ?Lab 10/06/21 ?1341 10/11/21 ?2157  ?WBC 5.2 10.1  ?HGB 13.0 13.4  ?HCT 38.3* 39.4  ?PLT 208 180  ? ?Recent Labs  ?Lab 10/06/21 ?1341  ?APTT 28  ?INR 1.0  ?  ?   ? ? ?Other tests/results: none  ? ?Assessment/Plan: ? ?[name] is a 71 y.o male s/o cervical decompression and fusion without complication. ? ?- mobilize ?- pain control ?- DVT prophylaxis ?- PTOT ?- Please obtain figure of 8 brace from orthotics. ? ?Cooper Render PA-C ?Department of Neurosurgery ? ?  ?

## 2021-10-12 NOTE — Plan of Care (Signed)

## 2021-10-12 NOTE — Evaluation (Addendum)
Physical Therapy Evaluation ?Patient Details ?Name: [name] ?MRN: [id number] ?DOB: 1951-03-23 ?Today's Date: 10/12/2021 ? ?History of Present Illness ? Patient is a 71 year old male with cervical radiculopathy and left hand weakness. s/p C6-7, C7-T1, and T1-2. History of prior cervical surgery, lumbar surgery, TKA. ?  ?Clinical Impression ? Patient is agreeable to PT evaluation. He had been pre-medicated and just finished breakfast. Family member at the bedside. Patient is usually independent with activity and mobility at baseline and lives with his spouse.  ?The patient is demonstrating high level of mobility. Cervical brace donned prior to mobilizing. Gait training performed with and without rolling walker. Patient progressed from [place] guard assistance to supervision without need for rolling walker. He ambulated around the nursing station without difficulty. Stair training also completed without difficulty with only initial cues for safety. Reviewed general spine precautions following surgery and patient verbalized understanding. He is hopeful for home discharge as soon as possible. PT will follow up in the hospital to maximize independence and facilitate return to prior level of function.  ?   ? ?Recommendations for follow up therapy are one component of a multi-disciplinary discharge planning process, led by the attending physician.  Recommendations may be updated based on patient status, additional functional criteria and insurance authorization. ? ?Follow Up Recommendations Home; Follow physician's recommendations for follow up therapies ? ?  ?Assistance Recommended at Discharge Set up Supervision/Assistance  ?Patient can return home with the following ? Assist for transportation;A little help with bathing/dressing/bathroom ? ?  ?Equipment Recommendations None recommended by PT  ?Recommendations for Other Services ?    ?  ?Functional Status Assessment Patient has had a recent decline in their functional  status and demonstrates the ability to make significant improvements in function in a reasonable and predictable amount of time.  ? ?  ?Precautions / Restrictions Precautions ?Precautions: Fall ?Required Braces or Orthoses: Cervical Brace ?Cervical Brace:  (donn/doff in sitting position; patien has a [place] hard C-collar in room but is awaiting a figure 8 brace) ?Restrictions ?Weight Bearing Restrictions: No  ? ?  ? ?Mobility ? Bed Mobility ?Overal bed mobility: Modified Independent ?  ?  ?  ?  ?  ?  ?General bed mobility comments: with head of bed elevated, increased time but no physical assistance for sitting to supine. patient educated on logroll technique ?  ? ?Transfers ?Overall transfer level: Needs assistance ?Equipment used: None ?Transfers: Sit to/from Stand ?Sit to Stand: Supervision ?  ?  ?  ?  ?  ?General transfer comment: supervision for safety. no physical assistance required for standing. good safety awareness ?  ? ?Ambulation/Gait ?Ambulation/Gait assistance: Min guard, Supervision ?Gait Distance (Feet): 175 Feet ?Assistive device: None, Rolling walker (2 wheels) ?Gait Pattern/deviations: Step-through pattern ?Gait velocity: decreased ?  ?  ?General Gait Details: patient ambulated the first half with rolling walker for safety as he felt mild dizziness with standing. dizziness subsided with increased activity and patient walked the second half without assistive device. supervision for safety ? ?Stairs ?Stairs: Yes ?Stairs assistance: Supervision ?Stair Management: Alternating pattern ?Number of Stairs: 4 ?General stair comments: patient went up and down 4 steps with rail on the right going up. no loss of balance demonstrated. patient demonstrated good safety awarenes with occasional cues for safety ? ?Wheelchair Mobility ?  ? ?Modified Rankin (Stroke Patients Only) ?  ? ?  ? ?Balance Overall balance assessment: Needs assistance ?Sitting-balance support: Feet supported ?Sitting balance-Leahy Scale:  Good ?  ?  ?  Standing balance support: No upper extremity supported ?Standing balance-Leahy Scale: Fair ?Standing balance comment: no loss of balance in standing position ?  ?  ?  ?  ?  ?  ?  ?  ?  ?  ?  ?   ? ? ? ?Pertinent Vitals/Pain Pain Assessment ?Pain Assessment: Faces ?Faces Pain Scale: Hurts a little bit ?Pain Location: posterior neck ?Pain Descriptors / Indicators: Sore ?Pain Intervention(s): Limited activity within patient's tolerance, Premedicated before session  ? ? ?Home Living Family/patient expects to be discharged to:: Private residence ?Living Arrangements: Spouse/significant other ?Available Help at Discharge: Family ?Type of Home: House ?Home Access: Stairs to enter ?Entrance Stairs-Rails:  (rail on one side and wall on the other) ?Entrance Stairs-Number of Steps: 2 ?  ?Home Layout: One level ?Home Equipment: Conservation officer, nature (2 wheels) ?   ?  ?Prior Function Prior Level of Function : Independent/Modified Independent;Driving ?  ?  ?  ?  ?  ?  ?Mobility Comments: independent ?ADLs Comments: independent ?  ? ? ?Hand Dominance  ? Dominant Hand: Right ? ?  ?Extremity/Trunk Assessment  ? Upper Extremity Assessment ?Upper Extremity Assessment: LUE deficits/detail (possible left hand weakness noted with functional observation, difficulty with extension of digits. see OT note for more details) ?  ? ?Lower Extremity Assessment ?Lower Extremity Assessment: Overall WFL for tasks assessed ?  ? ?   ?Communication  ? Communication: No difficulties  ?Cognition Arousal/Alertness: Awake/alert ?Behavior During Therapy: [facility] for tasks assessed/performed ?Overall Cognitive Status: Within Functional Limits for tasks assessed ?  ?  ?  ?  ?  ?  ?  ?  ?  ?  ?  ?  ?  ?  ?  ?  ?  ?  ?  ? ?  ?General Comments General comments (skin integrity, edema, etc.): patient educated on general spine precuations and logroll technique. he verbalized understanding and is familiar as he has had multiple spine surgeries in the past ? ?   ?Exercises    ? ?Assessment/Plan  ?  ?PT Assessment Patient needs continued PT services  ?PT Problem List Decreased mobility;Decreased safety awareness;Decreased balance ? ?   ?  ?PT Treatment Interventions DME instruction;Stair training;Gait training;Functional mobility training;Therapeutic activities;Therapeutic exercise;Balance training;Neuromuscular re-education;Patient/family education   ? ?PT Goals (Current goals can be found in the Care Plan section)  ?Acute Rehab PT Goals ?Patient Stated Goal: to return home ?PT Goal Formulation: With patient ?Time For Goal Achievement: 10/26/21 ?Potential to Achieve Goals: Good ? ?  ?Frequency 7X/week ?  ? ? ?Co-evaluation   ?  ?  ?  ?  ? ? ?  ?AM-PAC PT "6 Clicks" Mobility  ?Outcome Measure Help needed turning from your back to your side while in a flat bed without using bedrails?: None ?Help needed moving from lying on your back to sitting on the side of a flat bed without using bedrails?: None ?Help needed moving to and from a bed to a chair (including a wheelchair)?: A Little ?Help needed standing up from a chair using your arms (e.g., wheelchair or bedside chair)?: A Little ?Help needed to walk in hospital room?: A Little ?Help needed climbing 3-5 steps with a railing? : A Little ?6 Click Score: 20 ? ?  ?End of Session Equipment Utilized During Treatment: Gait belt ?Activity Tolerance: Patient tolerated treatment well ?Patient left: in bed;with call bell/phone within reach;with bed alarm set;with family/visitor present (daughter present) ?Nurse Communication: Mobility status ?PT Visit Diagnosis: Muscle weakness (generalized) (  M62.81) ?  ? ?Time: 0623-7628 ?PT Time Calculation (min) (ACUTE ONLY): 38 min ? ? ?Charges:   PT Evaluation ?$PT Eval Low Complexity: 1 Low ?PT Treatments ?$Gait Training: 8-22 mins ?  ?   ? ? ?Minna Merritts, PT, MPT ? ? ?Percell Locus ?10/12/2021, 12:37 PM ? ?

## 2021-10-12 NOTE — Evaluation (Signed)
Occupational Therapy Evaluation ?Patient Details ?Name: [name] ?MRN: [id number] ?DOB: 1950-12-25 ?Today's Date: 10/12/2021 ? ? ?History of Present Illness Patient is a 71 year old male with cervical radiculopathy and left hand weakness. s/p C6-7, C7-T1, and T1-2. History of prior cervical surgery, lumbar surgery, TKA.  ? ?Clinical Impression ?  ?Pt seen for OT evaluation this date, POD#1 from above surgery. Prior to hospital admission, pt was independent with mobility, ADL, and IADL. No falls in past 6 months. Pt lives with spouse in a single family home and endorses several previous back surgeries. Pt received in bed, endorsing significant increase in pain at session progresses so mobility deferred. Pt premedicated. Pt/family educated in cervical precautions and how to maintain during ADL/mobility, self care skills, AE/DME, and home/routines modifications to maximize safety and functional independence while minimizing falls risk and maintaining precautions. Handout provided to support recall and carryover. Pt verbalized understanding of all education/training provided. Do not anticipate skilled OT needs upon discharge. Will continue to progress while hospitalized towards OT goals.    ? ?Recommendations for follow up therapy are one component of a multi-disciplinary discharge planning process, led by the attending physician.  Recommendations may be updated based on patient status, additional functional criteria and insurance authorization.  ? ?Follow Up Recommendations ? No OT follow up  ?  ?Assistance Recommended at Discharge PRN  ?Patient can return home with the following A little help with bathing/dressing/bathroom;Assistance with cooking/housework;Assist for transportation;Help with stairs or ramp for entrance ? ?  ?Functional Status Assessment ? Patient has had a recent decline in their functional status and demonstrates the ability to make significant improvements in function in a reasonable and  predictable amount of time.  ?Equipment Recommendations ? None recommended by OT  ?  ?Recommendations for Other Services   ? ? ?  ?Precautions / Restrictions Precautions ?Precautions: Fall ?Required Braces or Orthoses: Cervical Brace ?Cervical Brace:  (donn/doff in sitting position; patien has a [place] hard C-collar in room but is awaiting a figure 8 brace) ?Restrictions ?Weight Bearing Restrictions: No  ? ?  ? ?Mobility Bed Mobility ?  ?  ?  ?  ?  ?  ?  ?General bed mobility comments: pt long sitting in bed, OOB deferred 2/2 increased pain ?  ? ?Transfers ?  ?  ?  ?  ?  ?  ?  ?  ?  ?  ?  ? ?  ?Balance   ?  ?  ?  ?  ?  ?  ?  ?  ?  ?  ?  ?  ?  ?  ?  ?  ?  ?  ?   ? ?ADL either performed or assessed with clinical judgement  ? ?ADL   ?  ?  ?  ?  ?  ?  ?  ?  ?  ?  ?  ?  ?  ?  ?  ?  ?  ?  ?  ?General ADL Comments: Pt requires MIN A for LB ADL tasks 2/2 precautions  ? ? ? ?Vision   ?   ?   ?Perception   ?  ?Praxis   ?  ? ?Pertinent Vitals/Pain Pain Assessment ?Pain Assessment: Faces ?Faces Pain Scale: Hurts little more ?Pain Location: posterior neck ?Pain Descriptors / Indicators: Sore, Grimacing, Guarding ?Pain Intervention(s): Limited activity within patient's tolerance, Monitored during session, Repositioned  ? ? ? ?Hand Dominance Right ?  ?Extremity/Trunk Assessment Upper Extremity Assessment ?Upper  Extremity Assessment: LUE deficits/detail ?LUE Deficits / Details: WFL except cmoposite finger extension, ~3/5 otherwise 5/5; decreased sensation but pt reports this has improved some since surgery ?  ?Lower Extremity Assessment ?Lower Extremity Assessment: Overall WFL for tasks assessed ?  ?Cervical / Trunk Assessment ?Cervical / Trunk Assessment: Neck Surgery ?  ?Communication Communication ?Communication: No difficulties ?  ?Cognition Arousal/Alertness: Awake/alert ?Behavior During Therapy: [facility] for tasks assessed/performed ?Overall Cognitive Status: Within Functional Limits for tasks assessed ?  ?  ?  ?  ?  ?  ?  ?  ?   ?  ?  ?  ?  ?  ?  ?  ?General Comments: PRN VC to redirect ?  ?  ?General Comments  patient educated on general spine precuations and logroll technique. he verbalized understanding and is familiar as he has had multiple spine surgeries in the past ? ?  ?Exercises Other Exercises ?Other Exercises: Pt/family educated in cervical precautions, falls prevention, hmoe/routines modificaitons, and AE/DME; handout provided ?  ?Shoulder Instructions    ? ? ?Home Living Family/patient expects to be discharged to:: Private residence ?Living Arrangements: Spouse/significant other ?Available Help at Discharge: Family ?Type of Home: House ?Home Access: Stairs to enter ?Entrance Stairs-Number of Steps: 2 ?Entrance Stairs-Rails:  (rail on one side and wall on the other) ?Home Layout: One level ?  ?  ?Bathroom Shower/Tub: Walk-in shower ?  ?  ?  ?  ?Home Equipment: Conservation officer, nature (2 wheels) ?  ?  ?  ? ?  ?Prior Functioning/Environment Prior Level of Function : Independent/Modified Independent;Driving ?  ?  ?  ?  ?  ?  ?Mobility Comments: independent ?ADLs Comments: independent ?  ? ?  ?  ?OT Problem List: Decreased range of motion;Decreased strength;Pain;Decreased knowledge of use of DME or AE;Decreased knowledge of precautions ?  ?   ?OT Treatment/Interventions: Self-care/ADL training;Therapeutic exercise;Therapeutic activities;DME and/or AE instruction;Patient/family education  ?  ?OT Goals(Current goals can be found in the care plan section) Acute Rehab OT Goals ?Patient Stated Goal: go home ?OT Goal Formulation: With patient/family ?Time For Goal Achievement: 10/26/21 ?Potential to Achieve Goals: Good ?ADL Goals ?Pt Will Perform Lower Body Dressing: with modified independence;sit to/from stand (maintaining precautions, AE PRN) ?Additional ADL Goal #1: Pt will perform all aspects of bathing with modified independence, maintaining precautions. ?Additional ADL Goal #2: Pt will verbalize 100% of precautions and how to maintain  during bathing, toileting, and dressing with PRN VC.  ?OT Frequency: Min 2X/week ?  ? ?Co-evaluation   ?  ?  ?  ?  ? ?  ?AM-PAC OT "6 Clicks" Daily Activity     ?Outcome Measure Help from another person eating meals?: None ?Help from another person taking care of personal grooming?: None ?Help from another person toileting, which includes using toliet, bedpan, or urinal?: A Little ?Help from another person bathing (including washing, rinsing, drying)?: A Little ?Help from another person to put on and taking off regular upper body clothing?: None ?Help from another person to put on and taking off regular lower body clothing?: A Little ?6 Click Score: 21 ?  ?End of Session   ? ?Activity Tolerance: Patient limited by pain ?Patient left: in bed;with call bell/phone within reach;with family/visitor present ? ?OT Visit Diagnosis: Other abnormalities of gait and mobility (R26.89);Pain ?Pain - part of body:  (neck)  ?              ?Time: 9485-4627 ?OT Time Calculation (min): 21 min ?Charges:  OT General Charges ?$OT Visit: 1 Visit ?OT Evaluation ?$OT Eval Moderate Complexity: 1 Mod ?OT Treatments ?$Self Care/Home Management : 8-22 mins ? ?Ardeth Perfect., MPH, MS, OTR/L ?ascom [phone] ?10/12/21, 2:59 PM ?

## 2021-10-12 NOTE — Progress Notes (Signed)
0915 ?Pt ambulating in hallway with PT at this time  ?

## 2021-10-12 NOTE — Progress Notes (Signed)
Orthopedic Tech Progress Note ?Patient Details:  ?ODARIUS DINES ?06/05/1951 ?[id number] ? ?Called in order to [employer] for a "FIGURE 8 BRACE" ? ?Patient ID: COURTEZ TWADDLE, male   DOB: 01/10/1951, 71 y.o.   MRN: [id number] ? ?Janit Pagan ?10/12/2021, 2:26 PM ? ?

## 2021-10-12 NOTE — Anesthesia Postprocedure Evaluation (Signed)
Anesthesia Post Note ? ?Patient: [name] ? ?Procedure(s) Performed: C6-T3 POSTERIOR FUSION (Back) ?C6-T2 POSTERIOR SPINAL DECOMPRESSION (Back) ?APPLICATION OF INTRAOPERATIVE CT SCAN ? ?Patient location during evaluation: PACU ?Anesthesia Type: General ?Level of consciousness: awake and alert ?Pain management: pain level controlled ?Vital Signs Assessment: post-procedure vital signs reviewed and stable ?Respiratory status: spontaneous breathing, nonlabored ventilation and respiratory function stable ?Cardiovascular status: blood pressure returned to baseline and stable ?Postop Assessment: no apparent nausea or vomiting ?Anesthetic complications: no ? ? ?No notable events documented. ? ? ?Last Vitals:  ?Vitals:  ? 10/12/21 1136 10/12/21 1645  ?BP: 107/68 120/67  ?Pulse: 81 (!) 101  ?Resp: 18 16  ?Temp: 36.7 ?C 36.9 ?C  ?SpO2: 98% 98%  ?  ?Last Pain:  ?Vitals:  ? 10/12/21 1912  ?TempSrc:   ?PainSc: 7   ? ? ?  ?  ?  ?  ?  ?  ? ?[place] Ouch ? ? ? ? ?

## 2021-10-13 ENCOUNTER — Encounter: Payer: Self-pay | Admitting: Neurosurgery

## 2021-10-13 MED ORDER — OXYCODONE HCL 10 MG PO TABS: 10.0000 mg | ORAL_TABLET | ORAL | 0 refills | Status: AC | PRN

## 2021-10-13 MED ORDER — SENNA 8.6 MG PO TABS: 1.0000 | ORAL_TABLET | Freq: Two times a day (BID) | ORAL | 0 refills | Status: DC | PRN

## 2021-10-13 MED ORDER — METHOCARBAMOL 500 MG PO TABS: 500.0000 mg | ORAL_TABLET | Freq: Four times a day (QID) | ORAL | 0 refills | Status: DC | PRN

## 2021-10-13 NOTE — Discharge Summary (Signed)
Physician Discharge Summary  ?Patient ID: ?[name] ?MRN: [id number] ?DOB/AGE: 1950-11-29 71 y.o. ? ?Admit date: 10/11/2021 ?Discharge date: 10/13/2021 ? ?Admission Diagnoses: Cervical radiculopathy with hand weakness.  ? ?Discharge Diagnoses:  ?Principal Problem: ?  S/P cervical spinal fusion ? ? ?Discharged Condition: good ? ?Hospital Course:  ?ARAF CLUGSTON is a 71 y.o s/p C6-T3 PSF. C6-7 and T1-2 foraminotomies on 10/11/21 with Dr. Izora Ribas. His interoperative course was uncomplicated and he was admitted for therapy evaluation and drain monitoring. PT and OT recommended discharge home with home health. His drain output was monitored for 2 days and removed on POD2 when the output reduced to an acceptable level. He was given prescriptions for pain medication and muscle relaxant ? ?Consults: None ? ?Significant Diagnostic Studies: none  ? ?Treatments: surgery: as above. Please see separately dictated operative report for further details ? ?Discharge Exam: ?Blood pressure 112/66, pulse (!) 101, temperature 98.2 ?F (36.8 ?C), resp. rate 20, height '5\' 6"'$  (1.676 m), weight 88.6 kg, SpO2 98 %. ?AA Ox3 ?CNI ?  ?Strength:5/5 throughout RUE and LUE except 2/5 left IO, wrist extension, and finger extension.  ?Ambulated to bathroom with minimal assistance  ?Wound vac in place.  ? ?Disposition: Discharge disposition: 01-Home or Self Care ? ? ? ? ? ? ?Discharge Instructions   ? ? Discharge wound care:   Complete by: As directed ?  ? Remove wound vac 3/30. Dress with sterile dressing. Remove dressing and leave open to air on 3/21.  ? Incentive spirometry RT   Complete by: As directed ?  ? ?  ? ?Allergies as of 10/13/2021   ? ?   Reactions  ? Chantix [varenicline] Other (See Comments)  ? headaches  ? ?  ? ?  ?Medication List  ?  ? ?STOP taking these medications   ? ?Eliquis 5 MG Tabs tablet ?Generic drug: apixaban ?  ?naproxen sodium 220 MG tablet ?Commonly known as: ALEVE ?  ?traMADol 50 MG tablet ?Commonly known as:  ULTRAM ?  ? ?  ? ?TAKE these medications   ? ?Albuterol Sulfate 108 (90 Base) MCG/ACT Aepb ?Commonly known as: ProAir RespiClick ?Inhale 2 puffs into the lungs every 6 (six) hours as needed (cough, shortness of breath). ?  ?atorvastatin 10 MG tablet ?Commonly known as: LIPITOR ?TAKE 1 TABLET(10 MG) BY MOUTH DAILY ?What changed:  ?how much to take ?how to take this ?when to take this ?  ?Cholecalciferol 25 MCG (1000 UT) tablet ?Take 1,000 Units by mouth daily. ?  ?Fluticasone-Salmeterol 100-50 MCG/DOSE Aepb ?Commonly known as: ADVAIR ?Inhale 1 puff into the lungs daily as needed (Shortness of breath). WIXELA BRAND ?  ?gabapentin 300 MG capsule ?Commonly known as: NEURONTIN ?Take by mouth. ?  ?lisinopril 20 MG tablet ?Commonly known as: ZESTRIL ?Take 1 tablet (20 mg total) by mouth daily. ?What changed: when to take this ?  ?loratadine 10 MG tablet ?Commonly known as: CLARITIN ?Take 10 mg by mouth as needed. ?  ?methocarbamol 500 MG tablet ?Commonly known as: ROBAXIN ?Take 1 tablet (500 mg total) by mouth every 6 (six) hours as needed for muscle spasms. ?  ?metoprolol tartrate 50 MG tablet ?Commonly known as: LOPRESSOR ?Take 1 tablet (50 mg total) by mouth 2 (two) times daily. ?  ?OVER THE COUNTER MEDICATION ?Take 2 tablets by mouth daily. sulfurzyme ?  ?OVER THE COUNTER MEDICATION ?Take 1,500 mg by mouth daily. BLM Capsules - [place] ?750 mg each ?  ?Oxycodone HCl 10 MG  Tabs ?Take 1 tablet (10 mg total) by mouth every 4 (four) hours as needed for up to 5 days for severe pain ((score 7 to 10)). ?  ?senna 8.6 MG Tabs tablet ?Commonly known as: SENOKOT ?Take 1 tablet (8.6 mg total) by mouth 2 (two) times daily as needed for mild constipation. ?  ?sildenafil 50 MG tablet ?Commonly known as: VIAGRA ?Take 50 mg by mouth daily as needed for erectile dysfunction. Taking 1/2 tablet PRN (from [place]) ?  ?tiotropium 18 MCG inhalation capsule ?Commonly known as: SPIRIVA ?Place 18 mcg into inhaler and  inhale as needed. ?  ? ?  ? ?  ?  ? ? ?  ?Discharge Care Instructions  ?(From admission, onward)  ?  ? ? ?  ? ?  Start     Ordered  ? 10/13/21 0000  Discharge wound care:       ?Comments: Remove wound vac 3/30. Dress with sterile dressing. Remove dressing and leave open to air on 3/21.  ? 10/13/21 1106  ? ?  ?  ? ?  ? ? Follow-up Information   ? ? Loleta Dicker, PA Follow up in 2 week(s).   ?Why: For incision check. This appointment should be on your pre-op paperwork. Please call the office with any questions or concerns regarding appointment date or time. ?Contact information: ?[place] [address] ?[phone] ? ? ?  ?  ? ?  ?  ? ?  ? ? ?Signed: ?Loleta Dicker ?10/13/2021, 11:11 AM ? ? ?

## 2021-10-13 NOTE — Progress Notes (Signed)
? ?   Attending Progress Note ? ?History: [name] is a 71 y.o s/p C6-T3 PSF. C6-7 and T1-2 foraminotomies. ? ?POD2: improved left arm pain. Continued posterior neck pain  ? ?POD1: Continued left finger weakness and neck pain. Reports improved left arm pain  ? ?Physical Exam: ?Vitals:  ? 10/12/21 2334 10/13/21 0424  ?BP: 112/72 134/67  ?Pulse: 90 100  ?Resp: 18 18  ?Temp: 97.9 ?F (36.6 ?C) 97.9 ?F (36.6 ?C)  ?SpO2: 99% 97%  ? ? ?AA Ox3 ?CNI ? ?Strength:5/5 throughout RUE and LUE except 2/5 left IO, wrist extension, and finger extension.  ? ?HV 5 yesterday and minimal overnight  ?Wound vac in place.  ? ?Data: ? ?Recent Labs  ?Lab 10/06/21 ?1341 10/11/21 ?2157  ?NA 137  --   ?K 4.7  --   ?CL 104  --   ?CO2 29  --   ?BUN 16  --   ?CREATININE 0.89 1.18  ?GLUCOSE 117*  --   ?CALCIUM 9.1  --   ? ? ?No results for input(s): AST, ALT, ALKPHOS in the last 168 hours. ? ?Invalid input(s): TBILI  ? Recent Labs  ?Lab 10/06/21 ?1341 10/11/21 ?2157  ?WBC 5.2 10.1  ?HGB 13.0 13.4  ?HCT 38.3* 39.4  ?PLT 208 180  ? ? ?Recent Labs  ?Lab 10/06/21 ?1341  ?APTT 28  ?INR 1.0  ? ?  ?   ? ? ?Other tests/results: none  ? ?Assessment/Plan: ? ?[name] is a 71 y.o male s/o cervical decompression and fusion without complication. ? ?- mobilize ?- pain control ?- DVT prophylaxis ?- PTOT ?- Please obtain figure of 8 brace from orthotics. ?- dispo planning underway ? ?Pedro Render PA-C ?Department of Neurosurgery ? ?  ?

## 2021-10-13 NOTE — Progress Notes (Signed)
DISCHARGE NOTE: ? ?Pt given discharge instructions, pt verbalized understanding. Wound vac in place. Pt wheeled to car by staff, Wife providing transportation.  ?

## 2021-10-13 NOTE — Progress Notes (Signed)
Met with the patient in the room at the bedside ?He lives at home with his wife ?They currently have DME listed ?They have no further needs ?They have transportation with his wife and daughter ?They can afford their medication ?They are set up with [facility] for Home health services  ?

## 2021-10-15 ENCOUNTER — Other Ambulatory Visit: Payer: PPO

## 2021-10-19 ENCOUNTER — Telehealth: Payer: Self-pay

## 2021-10-19 NOTE — Chronic Care Management (AMB) (Signed)
? ? ?Chronic Care Management ?Pharmacy Assistant  ? ?Name: [name]  MRN: [id number] DOB: 1950/10/18 ? ? ?Reason for Encounter: Hypertension Disease State ?  ? ?Recent office visits:  ?None since last CCM contact ? ?Recent consult visits:  ?10/05/21-Optometry- [place]-Hypertension with mild retinopathy OU-Tetracaine 0.5%, Mydriacyl 1% and Neosynephrine 2.5% ?09/28/21-Cardiology-Hao Meng,MD-Telemedicine-Pre op clearance for cervical spine fusion surgery ?09/23/21-[facility] -Michelle Piper- follow up hand weakness and cervical radiculopathy. Planned surgery-stop Eliquis  3 days prior and 14 days following-preop covid test required. ?09/13/21-Neurosurgery-[place] Y[place],MD- follow up neck pain and hand weakness.Referral for CT-Scan ?09/07/21-[facility] [place]- I did encourage him to restart gabapentin and have given him a prescription for tramadol to use as needed for severe pain. We will update an MRI. ? ?Hospital visits:  ?Medication Reconciliation was completed by comparing discharge summary, patient?s EMR and Pharmacy list, and upon discussion with patient. ? ?Admitted to the hospital on 10/11/21 due to Cervical Spinal Fusion . Discharge date was 10/13/21. Discharged from [facility].   ? ?New?Medications Started at [facility] Discharge:?? ?-started methocarbamol ? Oxycodone ? Senna ? ?Medications Discontinued at Hospital Discharge: ?-Stopped Eliquis ? Tramadol ? Naproxen ? ?Medications that remain the same after Hospital Discharge:??  ?-All other medications will remain the same.   ? ?Medications: ?Outpatient Encounter Medications as of 10/19/2021  ?Medication Sig Note  ? Albuterol Sulfate (PROAIR RESPICLICK) 102 (90 Base) MCG/ACT AEPB Inhale 2 puffs into the lungs every 6 (six) hours as needed (cough, shortness of breath). 10/05/2021: No electronic evidence of it being filled in the past 12 months.  ? atorvastatin (LIPITOR) 10 MG tablet TAKE 1 TABLET(10 MG) BY MOUTH  DAILY (Patient taking differently: Take 10 mg by mouth every morning. TAKE 1 TABLET(10 MG) BY MOUTH DAILY)   ? Cholecalciferol 25 MCG (1000 UT) tablet Take 1,000 Units by mouth daily.   ? Fluticasone-Salmeterol (ADVAIR) 100-50 MCG/DOSE AEPB Inhale 1 puff into the lungs daily as needed (Shortness of breath). [employer]   ? gabapentin (NEURONTIN) 300 MG capsule Take by mouth. (Patient not taking: Reported on 10/06/2021) 10/05/2021: 90 caps dispensed in December 2022.  ? lisinopril (ZESTRIL) 20 MG tablet Take 1 tablet (20 mg total) by mouth daily. (Patient taking differently: Take 20 mg by mouth every morning.)   ? loratadine (CLARITIN) 10 MG tablet Take 10 mg by mouth as needed.   ? methocarbamol (ROBAXIN) 500 MG tablet Take 1 tablet (500 mg total) by mouth every 6 (six) hours as needed for muscle spasms.   ? metoprolol tartrate (LOPRESSOR) 50 MG tablet Take 1 tablet (50 mg total) by mouth 2 (two) times daily.   ? OVER THE COUNTER MEDICATION Take 2 tablets by mouth daily. sulfurzyme   ? OVER THE COUNTER MEDICATION Take 1,500 mg by mouth daily. BLM Capsules - [place] ?750 mg each   ? senna (SENOKOT) 8.6 MG TABS tablet Take 1 tablet (8.6 mg total) by mouth 2 (two) times daily as needed for mild constipation.   ? sildenafil (VIAGRA) 50 MG tablet Take 50 mg by mouth daily as needed for erectile dysfunction. Taking 1/2 tablet PRN (from [place])   ? tiotropium (SPIRIVA) 18 MCG inhalation capsule Place 18 mcg into inhaler and inhale as needed.   ? ?No facility-administered encounter medications on file as of 10/19/2021.  ? ? ? ?Recent Office Vitals: ?BP Readings from Last 3 Encounters:  ?10/13/21 112/66  ?10/06/21 112/78  ?08/10/21 122/73  ? ?Pulse Readings from Last  3 Encounters:  ?10/13/21 (!) 101  ?10/06/21 96  ?05/27/21 92  ?  ?Wt Readings from Last 3 Encounters:  ?10/11/21 195 lb 5.2 oz (88.6 kg)  ?10/06/21 195 lb 5.2 oz (88.6 kg)  ?08/16/21 190 lb (86.2 kg)  ?  ? ?Kidney Function ?Lab Results   ?Component Value Date/Time  ? CREATININE 1.18 10/11/2021 09:57 PM  ? CREATININE 0.89 10/06/2021 01:41 PM  ? CREATININE 0.99 03/27/2020 03:04 PM  ? GFR 82.42 07/02/2021 08:12 AM  ? GFRNONAA >60 10/11/2021 09:57 PM  ? GFRAA 104 04/24/2020 12:06 PM  ? ? ? ?  Latest Ref Rng & Units 10/11/2021  ?  9:57 PM 10/06/2021  ?  1:41 PM 07/02/2021  ?  8:12 AM  ?BMP  ?Glucose 70 - 99 mg/dL  117   133    ?BUN 8 - 23 mg/dL  16   16    ?Creatinine 0.61 - 1.24 mg/dL 1.18   0.89   0.94    ?Sodium 135 - 145 mmol/L  137   137    ?Potassium 3.5 - 5.1 mmol/L  4.7   4.1    ?Chloride 98 - 111 mmol/L  104   101    ?CO2 22 - 32 mmol/L  29   27    ?Calcium 8.9 - 10.3 mg/dL  9.1   9.5    ? ? ? ?Contacted patient on 10/20/21 to discuss hypertension disease state ? ?Current antihypertensive regimen:  ?Lisinopril 20 mg - 1 tablet daily  ?Metoprolol tartrate 50 mg - 1 tablet twice daily ? ? Patient verbally confirms he is taking the above medications as directed. Yes ? ?How often are you checking your Blood Pressure? daily ? ?he checks his blood pressure in the morning after taking his medication. ? ?Current home BP readings:  None available Patient will start monitoring more frequently as he is post op cervical spine fusion and in severe pain at this time . Therapist will take when doing sessions with the patient. ? ?DATE:             BP               PULSE ? 10/20/21  146/76  104  Therapist recorded at time of therapy in home  ? ?Wrist or arm cuff:arm ?Over the counter medications including pseudoephedrine or NSAIDs? Pain medication for post op pain relief ? ?Any readings above 180/120? No ? ?What recent interventions/DTPs have been made by any provider to improve Blood Pressure control since last CPP Visit:  Patient has been in severe pain since surgery and will get back on tract with monitoring of his BP when he progresses from this surgery . ? ?Any recent hospitalizations or ED visits since last visit with CPP? Yes  10/11/21 thru 10/13/21 [facility]  cervical spine fusion. ? ?What diet changes have been made to improve Blood Pressure Control?  ?Patient has  not changed his diet recently, stays with fresh vegetables and lean meats for meals  ? ?What exercise is being done to improve your Blood Pressure Control?  ? Unable to do independently due to post op cervical spine fusion . Having PT in the home . ? ?Adherence Review: ?Is the patient currently on ACE/ARB medication? Yes ?Does the patient have >5 day gap between last estimated fill dates? No ? ? ?Star Rating Drugs:  ?Medication:  Last Fill: Day Supply ?Atorvastatin '10mg'$  08/22/21  90 ?Lisinopril '20mg'$  10/16/21  90 ? ? ?Care Gaps: ?Annual  wellness visit in last year? Yes ?Most Recent BP reading:129/78   09/23/21 ? ? ? ?Upcoming appointments: ?Cardiology appointment on 11/25/21 ? ? ?Charlene Brooke, CPP notified ? ?Stepehn Eckard, CCMA ?Health concierge  ?[phone]  ?

## 2021-10-25 ENCOUNTER — Other Ambulatory Visit: Payer: PPO

## 2021-11-02 ENCOUNTER — Ambulatory Visit: Payer: PPO | Attending: Neurosurgery | Admitting: Occupational Therapy

## 2021-11-02 ENCOUNTER — Encounter: Payer: Self-pay | Admitting: Occupational Therapy

## 2021-11-02 DIAGNOSIS — R278 Other lack of coordination: Secondary | ICD-10-CM | POA: Insufficient documentation

## 2021-11-02 DIAGNOSIS — M6281 Muscle weakness (generalized): Secondary | ICD-10-CM | POA: Insufficient documentation

## 2021-11-03 NOTE — Therapy (Signed)
[place] [facility] ?[place], [place], [address] ?Phone: [phone]   Fax:  [phone] ? ?Occupational Therapy Evaluation ? ?Patient Details  ?Name: [name] ?MRN: [id number] ?Date of Birth: May 29, 1951 ?Referring Provider (OT): Loleta Dicker, PA ? ? ?Encounter Date: 11/02/2021 ? ? OT End of Session - 11/03/21 1917   ? ? Visit Number 1   ? Number of Visits 24   ? Date for OT Re-Evaluation 01/26/22   ? OT Start Time 1100   ? OT Stop Time 1200   ? OT Time Calculation (min) 60 min   ? Activity Tolerance Patient tolerated treatment well   ? Behavior During Therapy [facility] for tasks assessed/performed   ? ?  ?  ? ?  ? ? ?Past Medical History:  ?Diagnosis Date  ? Allergic rhinitis 07/1998  ? Aortic atherosclerosis ([place]) 09/17/2016  ? Arthritis   ? BACK AND RIGHT KNEE  ? CAD (coronary artery disease) 07/01/2015  ? a.) LHC 07/01/2015: normal LV function; 30% p-mLAD, 20% p-mRCA; intervention deferred opting for medical mgmt.  ? COPD (chronic obstructive pulmonary disease) ([place]) 07/1998  ? centrilobular emphysema (09/2016) w ongoing tobacco use, spirometry (02/2013)  ? DDD (degenerative disc disease), cervical   ? DOE (dyspnea on exertion)   ? Erectile dysfunction   ? a.) on PDE5i (sildenafil)  ? ETOH abuse   ? a.) consumes 4-5 beers on a daily basis  ? History of exposure to asbestos   ? HTN (hypertension) 05/1998  ? Hyperlipidemia 06/1996  ? Hypertensive retinopathy   ? Long term current use of anticoagulant   ? a.) apixaban  ? Lumbar spinal stenosis 03/2013  ? a.) s/p laminectomy  ? Macrocytic anemia   ? Migraines   ? Peripheral vascular disease ([place])   ? Persistent atrial fibrillation (HCC)   ? a.) CHA2DS2-VASc = 3 (age, HTN, aortic plaque). b.) rate/rhythm maintained on oral metoprolol tartrate; chronically anticoagulated with apixaban  ? 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 neg viral load - likely cleared infection  ? PVC's (premature  ventricular contractions)   ? Sciatic nerve injury   ? RESOLVED AFTER SURGERY  ? Smoker   ? ? ?Past Surgical History:  ?Procedure Laterality Date  ? ANTERIOR CERVICAL DECOMP/DISCECTOMY FUSION  2003  ? C3-7  ? APPLICATION OF INTRAOPERATIVE CT SCAN N/A 10/11/2021  ? Procedure: APPLICATION OF INTRAOPERATIVE CT SCAN;  Surgeon: Meade Maw, MD;  Location: [facility] ORS;  Service: Neurosurgery;  Laterality: N/A;  ? BACK SURGERY  2007  ? CARDIAC CATHETERIZATION N/A 07/01/2015  ? Procedure: Left Heart Cath and Coronary Angiography;  Surgeon: Wellington Hampshire, MD;  Location: [place] [facility];  Service: Cardiovascular;  Laterality: N/A;  ? CARDIOVERSION N/A 05/06/2020  ? Procedure: CARDIOVERSION;  Surgeon: Kate Sable, MD;  Location: [facility] OR[facility];  Service: Cardiovascular;  Laterality: N/A;  ? COLONOSCOPY  09/2010  ? HPx2, diverticula, hemorrhoids, rpt 10 yrs [place])  ? COLONOSCOPY  01/07/2021  ? [place])  ? CYSTOSCOPY WITH INSERTION OF UROLIFT N/A 11/02/2020  ? Procedure: CYSTOSCOPY WITH INSERTION OF UROLIFT;  Surgeon: Hollice Espy, MD;  Location: [facility] ORS;  Service: Urology;  Laterality: N/A;  ? ETT  2010  ? normal, ABIs normal  ? KNEE ARTHROSCOPY Right 2011  ? cartlage, ligaments  ? KNEE ARTHROSCOPY Left 1984  ? LUMBAR LAMINECTOMY  06/2020  ? Bilateral L2-3 laminectomy [name])  ? LUMBAR LAMINECTOMY/DECOMPRESSION MICRODISCECTOMY Left 03/27/2013  ? Procedure:  LUMBAR LAMINECTOMY/DECOMPRESSION MICRODISCECTOMY 2 LEVELS;  Surgeon: Ophelia Charter, MD;  Location: [place] NE[facility] ORS;  Service: Neurosurgery;  Laterality: Left;  LEFT Lumbar Three Four diskectomy with Lumbar Three-Four, Four-Five Laminectomy. L3/4 ahd L4/5 for LSS [name])   ? LUMBAR LAMINECTOMY/DECOMPRESSION MICRODISCECTOMY Bilateral 07/15/2020  ? Procedure: LAMINECTOMY AND FORAMINOTOMY BILATERAL LUMBAR TWO-LUMBAR THREE;  Surgeon: Newman Pies, MD;  Location: [place];  Service: Neurosurgery;  Laterality: Bilateral;  ? POSTERIOR CERVICAL  FUSION/FORAMINOTOMY N/A 05/14/2020  ? Procedure: LAMINECTOMY, POSTERIOR INSTRUMENTATION AND FUSION, CERVICAL ONE- CERVICAL TWO, CERVICAL TWO- CERVICAL THREE;  Surgeon: Newman Pies, MD;  Location: [place];  Service: Neurosurgery;  Laterality: N/A;  ? POSTERIOR CERVICAL FUSION/FORAMINOTOMY N/A 10/11/2021  ? Procedure: C6-T3 POSTERIOR FUSION;  Surgeon: Meade Maw, MD;  Location: [facility];  Service: Neurosurgery;  Laterality: N/A;  ? POSTERIOR CERVICAL LAMINECTOMY N/A 10/11/2021  ? Procedure: C6-T2 POSTERIOR SPINAL DECOMPRESSION;  Surgeon: Meade Maw, MD;  Location: [facility];  Service: Neurosurgery;  Laterality: N/A;  ? SHOULDER ARTHROSCOPY Right 2011  ? spriometry  02/2013  ? COPD with some restriction thought to be obesity related ([place])  ? TOTAL KNEE ARTHROPLASTY Right 06/27/2014  ? Procedure: RIGHT TOTAL KNEE ARTHROPLASTY;  Surgeon: Mcarth[place] Rossetti, MD;  [facility];  Service: Orthopedics;  Laterality: Right;  ? US ECHOCARDIOGRAPHY  01/2013  ? WNL, mild diastolic dysfunction  ? ? ?There were no vitals filed for this visit. ? ? Subjective Assessment - 11/03/21 2108   ? ? Subjective  Pt reports left hand weakness, which started in Feb of 2023,  had surgery for degenerating disc disease on 10-11-2021, He reports he has sensation but lacks strength in the hand, reports an adhesion in palm of hand.  He would like to return to independent  level of function and improve his hand strength and use   ? Pertinent History Per chart: [name] is a 71 y.o s/p C6-T3 PSF. C6-7 and T1-2 foraminotomies on 10/11/21 with Dr. Izora Ribas. His interoperative course was uncomplicated and he was admitted for therapy evaluation and drain monitoring. PT and OT recommended discharge home with home health. His drain output was monitored for 2 days and removed on POD2 when the output reduced to an acceptable level. He was given prescriptions for pain medication and muscle relaxant. Pt has a history of multiple  surgeries  Past Surgery: cervical surgery x 2 (about 20 years ago and in 2021), lumbar surgery about 10 years ago, lumbar surgery about 5 years ago, lumbar surgery (L1-3 decompression) 06/2020 with Dr [name] .  Per  follow up MD note after surgery:  PETRO TALENT is doing fair after cervical decompression and fusion. We restarted his gabapentin which is helped significantly with his postoperative right radiating arm pain. He continues to have some left hand weakness and a little bit of weakness in his tricep which were present preoperatively however he feels that he is improving some. He is only doing walking with home therapy. I would like for him to start occupational therapy to work on hand strengthening and we will put in a referral for this. He still requiring pain medication intermittently and would like a refill of his Percocet which was provided to him. We discussed activity escalation and I have advised the patient to lift up to 10 pounds until 6 weeks after surgery, then increase up to 25 pounds until 12 weeks after surgery. After 12 weeks post-op, the patient advised to increase activity as tolerated. he will return to clinic  in approximately 4 weeks with Dr. Izora Ribas with cervical xrays prior. He was encouraged to call the office in the interim should he have any questions or concerns. He expressed understanding was in agreement with this plan.   ? Patient Stated Goals Pt reports he would like his left hand stronger and use the hand as much as possible.   ? Currently in Pain? Yes   ? Pain Score 5    ? Pain Location Back   ? Pain Orientation Posterior   ? Pain Descriptors / Indicators Aching   ? Pain Type Surgical pain   ? Pain Onset 1 to 4 weeks ago   ? Pain Frequency Intermittent   ? ?  ?  ? ?  ? ? ? ? [place] OT Assessment - 11/03/21 2109   ? ?  ? Assessment  ? Medical Diagnosis C6-T3 PSF. C6-7 and T1-2 foraminotomies   ? Referring Provider (OT) Loleta Dicker, PA   ? Onset Date/Surgical Date  10/11/21   ? Hand Dominance Right   ? Next MD Visit May 2023   ?  ? Precautions  ? Precaution Comments brace on most of the time, dont raise arms overhead, 10# limit   ?  ? Balance Screen  ? Has the patient fallen in the past 6 mon

## 2021-11-04 ENCOUNTER — Ambulatory Visit: Payer: PPO

## 2021-11-04 DIAGNOSIS — M6281 Muscle weakness (generalized): Secondary | ICD-10-CM | POA: Diagnosis not present

## 2021-11-04 DIAGNOSIS — R278 Other lack of coordination: Secondary | ICD-10-CM

## 2021-11-05 NOTE — Therapy (Signed)
[place] [facility] ?[place], [place], [address] ?Phone: [phone]   Fax:  [phone] ? ?Occupational Therapy Treatment ? ?Patient Details  ?Name: [name] ?MRN: [id number] ?Date of Birth: Dec 08, 1950 ?Referring Provider (OT): Loleta Dicker, PA ? ? ?Encounter Date: 11/04/2021 ? ? OT End of Session - 11/05/21 0920   ? ? Visit Number 2   ? Number of Visits 24   ? Date for OT Re-Evaluation 01/26/22   ? OT Start Time 1145   ? OT Stop Time 1230   ? OT Time Calculation (min) 45 min   ? Activity Tolerance Patient tolerated treatment well   ? Behavior During Therapy [facility] for tasks assessed/performed   ? ?  ?  ? ?  ? ? ?Past Medical History:  ?Diagnosis Date  ? Allergic rhinitis 07/1998  ? Aortic atherosclerosis ([place]) 09/17/2016  ? Arthritis   ? BACK AND RIGHT KNEE  ? CAD (coronary artery disease) 07/01/2015  ? a.) LHC 07/01/2015: normal LV function; 30% p-mLAD, 20% p-mRCA; intervention deferred opting for medical mgmt.  ? COPD (chronic obstructive pulmonary disease) ([place]) 07/1998  ? centrilobular emphysema (09/2016) w ongoing tobacco use, spirometry (02/2013)  ? DDD (degenerative disc disease), cervical   ? DOE (dyspnea on exertion)   ? Erectile dysfunction   ? a.) on PDE5i (sildenafil)  ? ETOH abuse   ? a.) consumes 4-5 beers on a daily basis  ? History of exposure to asbestos   ? HTN (hypertension) 05/1998  ? Hyperlipidemia 06/1996  ? Hypertensive retinopathy   ? Long term current use of anticoagulant   ? a.) apixaban  ? Lumbar spinal stenosis 03/2013  ? a.) s/p laminectomy  ? Macrocytic anemia   ? Migraines   ? Peripheral vascular disease ([place])   ? Persistent atrial fibrillation (HCC)   ? a.) CHA2DS2-VASc = 3 (age, HTN, aortic plaque). b.) rate/rhythm maintained on oral metoprolol tartrate; chronically anticoagulated with apixaban  ? 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 neg viral load - likely cleared infection  ? PVC's (premature  ventricular contractions)   ? Sciatic nerve injury   ? RESOLVED AFTER SURGERY  ? Smoker   ? ? ?Past Surgical History:  ?Procedure Laterality Date  ? ANTERIOR CERVICAL DECOMP/DISCECTOMY FUSION  2003  ? C3-7  ? APPLICATION OF INTRAOPERATIVE CT SCAN N/A 10/11/2021  ? Procedure: APPLICATION OF INTRAOPERATIVE CT SCAN;  Surgeon: Meade Maw, MD;  Location: [facility];  Service: Neurosurgery;  Laterality: N/A;  ? BACK SURGERY  2007  ? CARDIAC CATHETERIZATION N/A 07/01/2015  ? Procedure: Left Heart Cath and Coronary Angiography;  Surgeon: Wellington Hampshire, MD;  Location: [place] [facility];  Service: Cardiovascular;  Laterality: N/A;  ? CARDIOVERSION N/A 05/06/2020  ? Procedure: CARDIOVERSION;  Surgeon: Kate Sable, MD;  Location: [facility];  Service: Cardiovascular;  Laterality: N/A;  ? COLONOSCOPY  09/2010  ? HPx2, diverticula, hemorrhoids, rpt 10 yrs [place])  ? COLONOSCOPY  01/07/2021  ? [place])  ? CYSTOSCOPY WITH INSERTION OF UROLIFT N/A 11/02/2020  ? Procedure: CYSTOSCOPY WITH INSERTION OF UROLIFT;  Surgeon: Hollice Espy, MD;  Location: [facility];  Service: Urology;  Laterality: N/A;  ? ETT  2010  ? normal, ABIs normal  ? KNEE ARTHROSCOPY Right 2011  ? cartlage, ligaments  ? KNEE ARTHROSCOPY Left 1984  ? LUMBAR LAMINECTOMY  06/2020  ? Bilateral L2-3 laminectomy [name])  ? LUMBAR LAMINECTOMY/DECOMPRESSION MICRODISCECTOMY Left 03/27/2013  ? Procedure:  LUMBAR LAMINECTOMY/DECOMPRESSION MICRODISCECTOMY 2 LEVELS;  Surgeon: Ophelia Charter, MD;  Location: [place] NE[facility] OR[facility];  Service: Neurosurgery;  Laterality: Left;  LEFT Lumbar Three Four diskectomy with Lumbar Three-Four, Four-Five Laminectomy. L3/4 ahd L4/5 for LSS [name])   ? LUMBAR LAMINECTOMY/DECOMPRESSION MICRODISCECTOMY Bilateral 07/15/2020  ? Procedure: LAMINECTOMY AND FORAMINOTOMY BILATERAL LUMBAR TWO-LUMBAR THREE;  Surgeon: Newman Pies, MD;  Location: [place];  Service: Neurosurgery;  Laterality: Bilateral;  ? POSTERIOR CERVICAL  FUSION/FORAMINOTOMY N/A 05/14/2020  ? Procedure: LAMINECTOMY, POSTERIOR INSTRUMENTATION AND FUSION, CERVICAL ONE- CERVICAL TWO, CERVICAL TWO- CERVICAL THREE;  Surgeon: Newman Pies, MD;  Location: [place];  Service: Neurosurgery;  Laterality: N/A;  ? POSTERIOR CERVICAL FUSION/FORAMINOTOMY N/A 10/11/2021  ? Procedure: C6-T3 POSTERIOR FUSION;  Surgeon: Meade Maw, MD;  Location: [facility];  Service: Neurosurgery;  Laterality: N/A;  ? POSTERIOR CERVICAL LAMINECTOMY N/A 10/11/2021  ? Procedure: C6-T2 POSTERIOR SPINAL DECOMPRESSION;  Surgeon: Meade Maw, MD;  Location: [facility];  Service: Neurosurgery;  Laterality: N/A;  ? SHOULDER ARTHROSCOPY Right 2011  ? spriometry  02/2013  ? COPD with some restriction thought to be obesity related ([place])  ? TOTAL KNEE ARTHROPLASTY Right 06/27/2014  ? Procedure: RIGHT TOTAL KNEE ARTHROPLASTY;  Surgeon: Mcarthur Rossetti, MD;  [facility];  Service: Orthopedics;  Laterality: Right;  ? US ECHOCARDIOGRAPHY  01/2013  ? WNL, mild diastolic dysfunction  ? ? ?There were no vitals filed for this visit. ? ? Subjective Assessment - 11/04/21 0918   ? ? Subjective  Pt reports being eager to work on his L hand strength.   ? Pertinent History Per chart: [name] is a 71 y.o s/p C6-T3 PSF. C6-7 and T1-2 foraminotomies on 10/11/21 with Dr. Izora Ribas. His interoperative course was uncomplicated and he was admitted for therapy evaluation and drain monitoring. PT and OT recommended discharge home with home health. His drain output was monitored for 2 days and removed on POD2 when the output reduced to an acceptable level. He was given prescriptions for pain medication and muscle relaxant. Pt has a history of multiple surgeries  Past Surgery: cervical surgery x 2 (about 20 years ago and in 2021), lumbar surgery about 10 years ago, lumbar surgery about 5 years ago, lumbar surgery (L1-3 decompression) 06/2020 with Dr [name] .  Per  follow up MD note after surgery:  [name] is doing fair after cervical decompression and fusion. We restarted his gabapentin which is helped significantly with his postoperative right radiating arm pain. He continues to have some left hand weakness and a little bit of weakness in his tricep which were present preoperatively however he feels that he is improving some. He is only doing walking with home therapy. I would like for him to start occupational therapy to work on hand strengthening and we will put in a referral for this. He still requiring pain medication intermittently and would like a refill of his Percocet which was provided to him. We discussed activity escalation and I have advised the patient to lift up to 10 pounds until 6 weeks after surgery, then increase up to 25 pounds until 12 weeks after surgery. After 12 weeks post-op, the patient advised to increase activity as tolerated. he will return to clinic in approximately 4 weeks with Dr. Izora Ribas with cervical xrays prior. He was encouraged to call the office in the interim should he have any questions or concerns. He expressed understanding was in agreement with this plan.   ? Patient Stated Goals Pt reports he  would like his left hand stronger and use the hand as much as possible.   ? Pain Score 5    ? Pain Location Back   ? Pain Orientation Posterior   ? Pain Descriptors / Indicators Aching   ? Pain Onset 1 to 4 weeks ago   ? Pain Frequency Intermittent   ? ?  ?  ? ?  ? ?Occupational Therapy Treatment: ?Therapeutic Exercise: ?Facilitated hand strengthening with use of hand gripper set at 11.2# to remove jumbo pegs from pegboard x3 trials using L hand, rest between sets.  Pt required initial cueing for slow and partial release of hand gripper when releasing pegs d/t weak digit extensors causing hand gripper to nearly drop from pt's hand.  OT issued pink theraputty, but was a bit too resistive at this time, so downgraded to yellow.  Pt to keep pink putty at home to work up to this  level of resistance.  Issued written handout for putty exercises.  Pt completed putty exercises as follows: gross grasping, lateral/2 point/3 point pinching, digit abd/add, digit extension (modified with 1 thin string of putty

## 2021-11-09 ENCOUNTER — Encounter: Payer: Self-pay | Admitting: Occupational Therapy

## 2021-11-09 ENCOUNTER — Ambulatory Visit: Payer: PPO | Admitting: Occupational Therapy

## 2021-11-09 DIAGNOSIS — R278 Other lack of coordination: Secondary | ICD-10-CM

## 2021-11-09 DIAGNOSIS — M6281 Muscle weakness (generalized): Secondary | ICD-10-CM | POA: Diagnosis not present

## 2021-11-09 NOTE — Therapy (Signed)
[place] [facility] ?[place], [place], [address] ?Phone: [phone]   Fax:  [phone] ? ?Occupational Therapy Treatment ? ?Patient Details  ?Name: [name] ?MRN: [id number] ?Date of Birth: [birth year] ?Referring Provider (OT): Loleta Dicker, PA ? ? ?Encounter Date: 11/09/2021 ? ? OT End of Session - 11/09/21 1041   ? ? Visit Number 3   ? Number of Visits 24   ? Date for OT Re-Evaluation 01/26/22   ? OT Start Time 1026   ? OT Stop Time 1116   ? OT Time Calculation (min) 50 min   ? Activity Tolerance Patient tolerated treatment well   ? Behavior During Therapy [facility] for tasks assessed/performed   ? ?  ?  ? ?  ? ? ?Past Medical History:  ?Diagnosis Date  ? Allergic rhinitis 07/1998  ? Aortic atherosclerosis ([place]) 09/17/2016  ? Arthritis   ? BACK AND RIGHT KNEE  ? CAD (coronary artery disease) 07/01/2015  ? a.) LHC 07/01/2015: normal LV function; 30% p-mLAD, 20% p-mRCA; intervention deferred opting for medical mgmt.  ? COPD (chronic obstructive pulmonary disease) ([place]) 07/1998  ? centrilobular emphysema (09/2016) w ongoing tobacco use, spirometry (02/2013)  ? DDD (degenerative disc disease), cervical   ? DOE (dyspnea on exertion)   ? Erectile dysfunction   ? a.) on PDE5i (sildenafil)  ? ETOH abuse   ? a.) consumes 4-5 beers on a daily basis  ? History of exposure to asbestos   ? HTN (hypertension) 05/1998  ? Hyperlipidemia 06/1996  ? Hypertensive retinopathy   ? Long term current use of anticoagulant   ? a.) apixaban  ? Lumbar spinal stenosis 03/2013  ? a.) s/p laminectomy  ? Macrocytic anemia   ? Migraines   ? Peripheral vascular disease ([place])   ? Persistent atrial fibrillation (HCC)   ? a.) CHA2DS2-VASc = 3 (age, HTN, aortic plaque). b.) rate/rhythm maintained on oral metoprolol tartrate; chronically anticoagulated with apixaban  ? 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 neg viral load - likely cleared infection  ? PVC's (premature  ventricular contractions)   ? Sciatic nerve injury   ? RESOLVED AFTER SURGERY  ? Smoker   ? ? ?Past Surgical History:  ?Procedure Laterality Date  ? ANTERIOR CERVICAL DECOMP/DISCECTOMY FUSION  2003  ? C3-7  ? APPLICATION OF INTRAOPERATIVE CT SCAN N/A 10/11/2021  ? Procedure: APPLICATION OF INTRAOPERATIVE CT SCAN;  Surgeon: Meade Maw, MD;  Location: [facility];  Service: Neurosurgery;  Laterality: N/A;  ? BACK SURGERY  2007  ? CARDIAC CATHETERIZATION N/A 07/01/2015  ? Procedure: Left Heart Cath and Coronary Angiography;  Surgeon: Wellington Hampshire, MD;  Location: [facility];  Service: Cardiovascular;  Laterality: N/A;  ? CARDIOVERSION N/A 05/06/2020  ? Procedure: CARDIOVERSION;  Surgeon: Kate Sable, MD;  Location: [facility];  Service: Cardiovascular;  Laterality: N/A;  ? COLONOSCOPY  09/2010  ? HPx2, diverticula, hemorrhoids, rpt 10 yrs [facility] [place])  ? COLONOSCOPY  01/07/2021  ? [facility] [place])  ? CYSTOSCOPY WITH INSERTION OF UROLIFT N/A 11/02/2020  ? Procedure: CYSTOSCOPY WITH INSERTION OF UROLIFT;  Surgeon: Hollice Espy, MD;  Location: [facility];  Service: Urology;  Laterality: N/A;  ? ETT  2010  ? normal, ABIs normal  ? KNEE ARTHROSCOPY Right 2011  ? cartlage, ligaments  ? KNEE ARTHROSCOPY Left 1984  ? LUMBAR LAMINECTOMY  06/2020  ? Bilateral L2-3 laminectomy [name])  ? LUMBAR LAMINECTOMY/DECOMPRESSION MICRODISCECTOMY Left 03/27/2013  ? Procedure:  LUMBAR LAMINECTOMY/DECOMPRESSION MICRODISCECTOMY 2 LEVELS;  Surgeon: Ophelia Charter, MD;  Location: [facility];  Service: Neurosurgery;  Laterality: Left;  LEFT Lumbar Three Four diskectomy with Lumbar Three-Four, Four-Five Laminectomy. L3/4 ahd L4/5 for LSS [name])   ? LUMBAR LAMINECTOMY/DECOMPRESSION MICRODISCECTOMY Bilateral 07/15/2020  ? Procedure: LAMINECTOMY AND FORAMINOTOMY BILATERAL LUMBAR TWO-LUMBAR THREE;  Surgeon: Newman Pies, MD;  Location: [facility];  Service: Neurosurgery;  Laterality: Bilateral;  ? POSTERIOR CERVICAL  FUSION/FORAMINOTOMY N/A 05/14/2020  ? Procedure: LAMINECTOMY, POSTERIOR INSTRUMENTATION AND FUSION, CERVICAL ONE- CERVICAL TWO, CERVICAL TWO- CERVICAL THREE;  Surgeon: Newman Pies, MD;  Location: [facility];  Service: Neurosurgery;  Laterality: N/A;  ? POSTERIOR CERVICAL FUSION/FORAMINOTOMY N/A 10/11/2021  ? Procedure: C6-T3 POSTERIOR FUSION;  Surgeon: Meade Maw, MD;  Location: [facility];  Service: Neurosurgery;  Laterality: N/A;  ? POSTERIOR CERVICAL LAMINECTOMY N/A 10/11/2021  ? Procedure: C6-T2 POSTERIOR SPINAL DECOMPRESSION;  Surgeon: Meade Maw, MD;  Location: [facility];  Service: Neurosurgery;  Laterality: N/A;  ? SHOULDER ARTHROSCOPY Right 2011  ? spriometry  02/2013  ? COPD with some restriction thought to be obesity related ([place])  ? TOTAL KNEE ARTHROPLASTY Right 06/27/2014  ? Procedure: RIGHT TOTAL KNEE ARTHROPLASTY;  Surgeon: Mcarthur Rossetti, MD;  Location: [facility];  Service: Orthopedics;  Laterality: Right;  ? US ECHOCARDIOGRAPHY  01/2013  ? WNL, mild diastolic dysfunction  ? ? ?There were no vitals filed for this visit. ? ? Subjective Assessment - 11/09/21 1030   ? ? Subjective  Pt reports pain at times in the neck, 2/10 today and seems worse when he has the brace on.  Reports he has been working on use of putty for hand strengthening but not everyday, some soreness noted in flexors of forearm after putty at home   ? Pertinent History Per chart: [name] is a 71 y.o s/p C6-T3 PSF. C6-7 and T1-2 foraminotomies on 10/11/21 with Dr. Izora Ribas. His interoperative course was uncomplicated and he was admitted for therapy evaluation and drain monitoring. PT and OT recommended discharge home with home health. His drain output was monitored for 2 days and removed on POD2 when the output reduced to an acceptable level. He was given prescriptions for pain medication and muscle relaxant. Pt has a history of multiple surgeries  Past Surgery: cervical surgery x 2 (about 20 years ago and in  2021), lumbar surgery about 10 years ago, lumbar surgery about 5 years ago, lumbar surgery (L1-3 decompression) 06/2020 with Dr [name] .  Per  follow up MD note after surgery:  ZIYAN SCHOON is doing fair after cervical decompression and fusion. We restarted his gabapentin which is helped significantly with his postoperative right radiating arm pain. He continues to have some left hand weakness and a little bit of weakness in his tricep which were present preoperatively however he feels that he is improving some. He is only doing walking with home therapy. I would like for him to start occupational therapy to work on hand strengthening and we will put in a referral for this. He still requiring pain medication intermittently and would like a refill of his Percocet which was provided to him. We discussed activity escalation and I have advised the patient to lift up to 10 pounds until 6 weeks after surgery, then increase up to 25 pounds until 12 weeks after surgery. After 12 weeks post-op, the patient advised to increase activity as tolerated. he will return to clinic in approximately 4 weeks with Dr. Izora Ribas with cervical xrays prior.  He was encouraged to call the office in the interim should he have any questions or concerns. He expressed understanding was in agreement with this plan.   ? Patient Stated Goals Pt reports he would like his left hand stronger and use the hand as much as possible.   ? Currently in Pain? Yes   ? Pain Score 2    ? Pain Location Neck   ? Pain Orientation Posterior   ? Pain Descriptors / Indicators Aching   ? Pain Onset 1 to 4 weeks ago   ? Pain Frequency Intermittent   ? ?  ?  ? ?  ? ?Pt stated at the end of session, "You really worked me hard today, I can tell it in my hand." ? ?Therapeutic Exercise: ?Pt seen for hand and finger strengthening with use of the following: ?Manipulation of Velcro squares from resistive board with use of  2 point and 3 point pinch patterns with cues for  technique to remove and place onto board.   ?Velcro board for wrist flex/ext with large cylindrical piece with handle, forwards and backwards for 3 total times, cues for hand placement on handle and griping patterns.   ?Finger ext

## 2021-11-11 ENCOUNTER — Ambulatory Visit: Payer: PPO | Admitting: Occupational Therapy

## 2021-11-11 DIAGNOSIS — M6281 Muscle weakness (generalized): Secondary | ICD-10-CM

## 2021-11-11 DIAGNOSIS — R278 Other lack of coordination: Secondary | ICD-10-CM

## 2021-11-14 NOTE — Therapy (Signed)
[place] [facility] ?[place], [place], [address] ?Phone: [phone]   Fax:  [phone] ? ?Occupational Therapy Treatment ? ?Patient Details  ?Name: [name] ?MRN: [id number] ?Date of Birth: Feb 23, 1951 ?Referring Provider (OT): Loleta Dicker, PA ? ? ?Encounter Date: 11/11/2021 ? ? OT End of Session - 11/14/21 1620   ? ? Visit Number 4   ? Number of Visits 24   ? Date for OT Re-Evaluation 01/26/22   ? OT Start Time 1016   ? OT Stop Time 1100   ? OT Time Calculation (min) 44 min   ? Activity Tolerance Patient tolerated treatment well   ? Behavior During Therapy [facility] for tasks assessed/performed   ? ?  ?  ? ?  ? ? ?Past Medical History:  ?Diagnosis Date  ? Allergic rhinitis 07/1998  ? Aortic atherosclerosis ([place]) 09/17/2016  ? Arthritis   ? BACK AND RIGHT KNEE  ? CAD (coronary artery disease) 07/01/2015  ? a.) LHC 07/01/2015: normal LV function; 30% p-mLAD, 20% p-mRCA; intervention deferred opting for medical mgmt.  ? COPD (chronic obstructive pulmonary disease) ([place]) 07/1998  ? centrilobular emphysema (09/2016) w ongoing tobacco use, spirometry (02/2013)  ? DDD (degenerative disc disease), cervical   ? DOE (dyspnea on exertion)   ? Erectile dysfunction   ? a.) on PDE5i (sildenafil)  ? ETOH abuse   ? a.) consumes 4-5 beers on a daily basis  ? History of exposure to asbestos   ? HTN (hypertension) 05/1998  ? Hyperlipidemia 06/1996  ? Hypertensive retinopathy   ? Long term current use of anticoagulant   ? a.) apixaban  ? Lumbar spinal stenosis 03/2013  ? a.) s/p laminectomy  ? Macrocytic anemia   ? Migraines   ? Peripheral vascular disease ([place])   ? Persistent atrial fibrillation (HCC)   ? a.) CHA2DS2-VASc = 3 (age, HTN, aortic plaque). b.) rate/rhythm maintained on oral metoprolol tartrate; chronically anticoagulated with apixaban  ? 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 neg viral load - likely cleared infection  ? PVC's (premature  ventricular contractions)   ? Sciatic nerve injury   ? RESOLVED AFTER SURGERY  ? Smoker   ? ? ?Past Surgical History:  ?Procedure Laterality Date  ? ANTERIOR CERVICAL DECOMP/DISCECTOMY FUSION  2003  ? C3-7  ? APPLICATION OF INTRAOPERATIVE CT SCAN N/A 10/11/2021  ? Procedure: APPLICATION OF INTRAOPERATIVE CT SCAN;  Surgeon: Meade Maw, MD;  Location: [facility];  Service: Neurosurgery;  Laterality: N/A;  ? BACK SURGERY  2007  ? CARDIAC CATHETERIZATION N/A 07/01/2015  ? Procedure: Left Heart Cath and Coronary Angiography;  Surgeon: Wellington Hampshire, MD;  Location: [facility];  Service: Cardiovascular;  Laterality: N/A;  ? CARDIOVERSION N/A 05/06/2020  ? Procedure: CARDIOVERSION;  Surgeon: Kate Sable, MD;  Location: [facility];  Service: Cardiovascular;  Laterality: N/A;  ? COLONOSCOPY  09/2010  ? HPx2, diverticula, hemorrhoids, rpt 10 yrs [facility] [place])  ? COLONOSCOPY  01/07/2021  ? [place] [facility] [place])  ? CYSTOSCOPY WITH INSERTION OF UROLIFT N/A 11/02/2020  ? Procedure: CYSTOSCOPY WITH INSERTION OF UROLIFT;  Surgeon: Hollice Espy, MD;  Location: [facility];  Service: Urology;  Laterality: N/A;  ? ETT  2010  ? normal, ABIs normal  ? KNEE ARTHROSCOPY Right 2011  ? cartlage, ligaments  ? KNEE ARTHROSCOPY Left 1984  ? LUMBAR LAMINECTOMY  06/2020  ? Bilateral L2-3 laminectomy [name])  ? LUMBAR LAMINECTOMY/DECOMPRESSION MICRODISCECTOMY Left 03/27/2013  ? Procedure:  LUMBAR LAMINECTOMY/DECOMPRESSION MICRODISCECTOMY 2 LEVELS;  Surgeon: Ophelia Charter, MD;  Location: [facility];  Service: Neurosurgery;  Laterality: Left;  LEFT Lumbar Three Four diskectomy with Lumbar Three-Four, Four-Five Laminectomy. L3/4 ahd L4/5 for LSS [name])   ? LUMBAR LAMINECTOMY/DECOMPRESSION MICRODISCECTOMY Bilateral 07/15/2020  ? Procedure: LAMINECTOMY AND FORAMINOTOMY BILATERAL LUMBAR TWO-LUMBAR THREE;  Surgeon: Newman Pies, MD;  Location: [facility];  Service: Neurosurgery;  Laterality: Bilateral;  ? POSTERIOR CERVICAL  FUSION/FORAMINOTOMY N/A 05/14/2020  ? Procedure: LAMINECTOMY, POSTERIOR INSTRUMENTATION AND FUSION, CERVICAL ONE- CERVICAL TWO, CERVICAL TWO- CERVICAL THREE;  Surgeon: Newman Pies, MD;  Location: [facility];  Service: Neurosurgery;  Laterality: N/A;  ? POSTERIOR CERVICAL FUSION/FORAMINOTOMY N/A 10/11/2021  ? Procedure: C6-T3 POSTERIOR FUSION;  Surgeon: Meade Maw, MD;  Location: [facility];  Service: Neurosurgery;  Laterality: N/A;  ? POSTERIOR CERVICAL LAMINECTOMY N/A 10/11/2021  ? Procedure: C6-T2 POSTERIOR SPINAL DECOMPRESSION;  Surgeon: Meade Maw, MD;  Location: [facility];  Service: Neurosurgery;  Laterality: N/A;  ? SHOULDER ARTHROSCOPY Right 2011  ? spriometry  02/2013  ? COPD with some restriction thought to be obesity related ([place])  ? TOTAL KNEE ARTHROPLASTY Right 06/27/2014  ? Procedure: RIGHT TOTAL KNEE ARTHROPLASTY;  Surgeon: Mcarthur Rossetti, MD;  [facility];  Service: Orthopedics;  Laterality: Right;  ? US ECHOCARDIOGRAPHY  01/2013  ? WNL, mild diastolic dysfunction  ? ? ?There were no vitals filed for this visit. ? ? Subjective Assessment - 11/14/21 1620   ? ? Pertinent History Per chart: [name] is a 71 y.o s/p C6-T3 PSF. C6-7 and T1-2 foraminotomies on 10/11/21 with Dr. Izora Ribas. His interoperative course was uncomplicated and he was admitted for therapy evaluation and drain monitoring. PT and OT recommended discharge home with home health. His drain output was monitored for 2 days and removed on POD2 when the output reduced to an acceptable level. He was given prescriptions for pain medication and muscle relaxant. Pt has a history of multiple surgeries  Past Surgery: cervical surgery x 2 (about 20 years ago and in 2021), lumbar surgery about 10 years ago, lumbar surgery about 5 years ago, lumbar surgery (L1-3 decompression) 06/2020 with Dr [name] .  Per  follow up MD note after surgery:  [name] is doing fair after cervical decompression and fusion. We  restarted his gabapentin which is helped significantly with his postoperative right radiating arm pain. He continues to have some left hand weakness and a little bit of weakness in his tricep which were present preoperatively however he feels that he is improving some. He is only doing walking with home therapy. I would like for him to start occupational therapy to work on hand strengthening and we will put in a referral for this. He still requiring pain medication intermittently and would like a refill of his Percocet which was provided to him. We discussed activity escalation and I have advised the patient to lift up to 10 pounds until 6 weeks after surgery, then increase up to 25 pounds until 12 weeks after surgery. After 12 weeks post-op, the patient advised to increase activity as tolerated. he will return to clinic in approximately 4 weeks with Dr. Izora Ribas with cervical xrays prior. He was encouraged to call the office in the interim should he have any questions or concerns. He expressed understanding was in agreement with this plan.   ? Patient Stated Goals Pt reports he would like his left hand stronger and use the hand as much as possible.   ?  Pain Onset 1 to 4 weeks ago   ? ?  ?  ? ?  ? ? ?Patient reports his wife is going out of town this weekend, and he will have to do everything on his own. States he occasionally will pick up food and is able to make light meal prep at home with sandwiches. Able to open a beer can and drink with left hand this week.  ? ?Pt reports 3/10 pain and reports he was pulling weeds at home this week.  ? ?Therapeutic Exercises: ?Pt seen for reaching tasks with grasp and release to place pegs with use of 100 peg Judy pegboard over wedge to increase elevation for reach.  Pt picking up 2-3 items at a time, placing into board, then using translatory skills of the hand to move to palm and then using the hand for storage.  Difficulty with extension of ring finger with attempts to  open.   ?Sustained grip strength with use of hand gripper on 3rd setting, 17# for 20 reps with cues for hand positioning on gripper.  ? ?Manipulation of small pegs to pick up and place/remove into board ? ?Difficulty with middle finge

## 2021-11-17 ENCOUNTER — Ambulatory Visit: Payer: PPO | Attending: Neurosurgery | Admitting: Occupational Therapy

## 2021-11-17 DIAGNOSIS — R278 Other lack of coordination: Secondary | ICD-10-CM | POA: Insufficient documentation

## 2021-11-17 DIAGNOSIS — M6281 Muscle weakness (generalized): Secondary | ICD-10-CM | POA: Diagnosis present

## 2021-11-19 ENCOUNTER — Ambulatory Visit: Payer: PPO | Admitting: Occupational Therapy

## 2021-11-20 ENCOUNTER — Encounter: Payer: Self-pay | Admitting: Occupational Therapy

## 2021-11-20 NOTE — Therapy (Signed)
[place] [facility] ?[place], [place], [address] ?Phone: [phone]   Fax:  [phone] ? ?Occupational Therapy Treatment ? ?Patient Details  ?Name: [name] ?MRN: [id number] ?Date of Birth: [birth year] ?Referring Provider (OT): Loleta Dicker, PA ? ? ?Encounter Date: 11/17/2021 ? ? OT End of Session - 11/20/21 1718   ? ? Visit Number 5   ? Number of Visits 24   ? Date for OT Re-Evaluation 01/26/22   ? OT Start Time 1100   ? OT Stop Time 1147   ? OT Time Calculation (min) 47 min   ? Activity Tolerance Patient tolerated treatment well   ? Behavior During Therapy [facility] for tasks assessed/performed   ? ?  ?  ? ?  ? ? ?Past Medical History:  ?Diagnosis Date  ? Allergic rhinitis 07/1998  ? Aortic atherosclerosis ([place]) 09/17/2016  ? Arthritis   ? BACK AND RIGHT KNEE  ? CAD (coronary artery disease) 07/01/2015  ? a.) LHC 07/01/2015: normal LV function; 30% p-mLAD, 20% p-mRCA; intervention deferred opting for medical mgmt.  ? COPD (chronic obstructive pulmonary disease) ([place]) 07/1998  ? centrilobular emphysema (09/2016) w ongoing tobacco use, spirometry (02/2013)  ? DDD (degenerative disc disease), cervical   ? DOE (dyspnea on exertion)   ? Erectile dysfunction   ? a.) on PDE5i (sildenafil)  ? ETOH abuse   ? a.) consumes 4-5 beers on a daily basis  ? History of exposure to asbestos   ? HTN (hypertension) 05/1998  ? Hyperlipidemia 06/1996  ? Hypertensive retinopathy   ? Long term current use of anticoagulant   ? a.) apixaban  ? Lumbar spinal stenosis 03/2013  ? a.) s/p laminectomy  ? Macrocytic anemia   ? Migraines   ? Peripheral vascular disease ([place])   ? Persistent atrial fibrillation (HCC)   ? a.) CHA2DS2-VASc = 3 (age, HTN, aortic plaque). b.) rate/rhythm maintained on oral metoprolol tartrate; chronically anticoagulated with apixaban  ? 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 neg viral load - likely cleared infection  ? PVC's (premature  ventricular contractions)   ? Sciatic nerve injury   ? RESOLVED AFTER SURGERY  ? Smoker   ? ? ?Past Surgical History:  ?Procedure Laterality Date  ? ANTERIOR CERVICAL DECOMP/DISCECTOMY FUSION  2003  ? C3-7  ? APPLICATION OF INTRAOPERATIVE CT SCAN N/A 10/11/2021  ? Procedure: APPLICATION OF INTRAOPERATIVE CT SCAN;  Surgeon: Meade Maw, MD;  Location: [facility];  Service: Neurosurgery;  Laterality: N/A;  ? BACK SURGERY  2007  ? CARDIAC CATHETERIZATION N/A 07/01/2015  ? Procedure: Left Heart Cath and Coronary Angiography;  Surgeon: Wellington Hampshire, MD;  Location: [place] [facility];  Service: Cardiovascular;  Laterality: N/A;  ? CARDIOVERSION N/A 05/06/2020  ? Procedure: CARDIOVERSION;  Surgeon: Kate Sable, MD;  Location: [facility];  Service: Cardiovascular;  Laterality: N/A;  ? COLONOSCOPY  09/2010  ? HPx2, diverticula, hemorrhoids, rpt 10 yrs [place])  ? COLONOSCOPY  01/07/2021  ? [place])  ? CYSTOSCOPY WITH INSERTION OF UROLIFT N/A 11/02/2020  ? Procedure: CYSTOSCOPY WITH INSERTION OF UROLIFT;  Surgeon: Hollice Espy, MD;  Location: [facility];  Service: Urology;  Laterality: N/A;  ? ETT  2010  ? normal, ABIs normal  ? KNEE ARTHROSCOPY Right 2011  ? cartlage, ligaments  ? KNEE ARTHROSCOPY Left 1984  ? LUMBAR LAMINECTOMY  06/2020  ? Bilateral L2-3 laminectomy [name])  ? LUMBAR LAMINECTOMY/DECOMPRESSION MICRODISCECTOMY Left 03/27/2013  ? Procedure:  LUMBAR LAMINECTOMY/DECOMPRESSION MICRODISCECTOMY 2 LEVELS;  Surgeon: Ophelia Charter, MD;  Location: [facility];  Service: Neurosurgery;  Laterality: Left;  LEFT Lumbar Three Four diskectomy with Lumbar Three-Four, Four-Five Laminectomy. L3/4 ahd L4/5 for LSS [name])   ? LUMBAR LAMINECTOMY/DECOMPRESSION MICRODISCECTOMY Bilateral 07/15/2020  ? Procedure: LAMINECTOMY AND FORAMINOTOMY BILATERAL LUMBAR TWO-LUMBAR THREE;  Surgeon: Newman Pies, MD;  Location: [place];  Service: Neurosurgery;  Laterality: Bilateral;  ? POSTERIOR CERVICAL  FUSION/FORAMINOTOMY N/A 05/14/2020  ? Procedure: LAMINECTOMY, POSTERIOR INSTRUMENTATION AND FUSION, CERVICAL ONE- CERVICAL TWO, CERVICAL TWO- CERVICAL THREE;  Surgeon: Newman Pies, MD;  Location: [place];  Service: Neurosurgery;  Laterality: N/A;  ? POSTERIOR CERVICAL FUSION/FORAMINOTOMY N/A 10/11/2021  ? Procedure: C6-T3 POSTERIOR FUSION;  Surgeon: Meade Maw, MD;  Location: [facility];  Service: Neurosurgery;  Laterality: N/A;  ? POSTERIOR CERVICAL LAMINECTOMY N/A 10/11/2021  ? Procedure: C6-T2 POSTERIOR SPINAL DECOMPRESSION;  Surgeon: Meade Maw, MD;  Location: [facility];  Service: Neurosurgery;  Laterality: N/A;  ? SHOULDER ARTHROSCOPY Right 2011  ? spriometry  02/2013  ? COPD with some restriction thought to be obesity related ([place])  ? TOTAL KNEE ARTHROPLASTY Right 06/27/2014  ? Procedure: RIGHT TOTAL KNEE ARTHROPLASTY;  Surgeon: Mcarthur Rossetti, MD;  Location: [facility];  Service: Orthopedics;  Laterality: Right;  ? US ECHOCARDIOGRAPHY  01/2013  ? WNL, mild diastolic dysfunction  ? ? ?There were no vitals filed for this visit. ? ? Subjective Assessment - 11/20/21 1716   ? ? Subjective  Patient reports, " I almost called and canceled for today, my right leg is hurting me so bad I could barely walk to get here, I had to stop multiple times and rest."   ? Pertinent History Per chart: [name] is a 71 y.o s/p C6-T3 PSF. C6-7 and T1-2 foraminotomies on 10/11/21 with Dr. Izora Ribas. His interoperative course was uncomplicated and he was admitted for therapy evaluation and drain monitoring. PT and OT recommended discharge home with home health. His drain output was monitored for 2 days and removed on POD2 when the output reduced to an acceptable level. He was given prescriptions for pain medication and muscle relaxant. Pt has a history of multiple surgeries  Past Surgery: cervical surgery x 2 (about 20 years ago and in 2021), lumbar surgery about 10 years ago, lumbar surgery about 5 years  ago, lumbar surgery (L1-3 decompression) 06/2020 with Dr [name] .  Per  follow up MD note after surgery:  [name] is doing fair after cervical decompression and fusion. We restarted his gabapentin which is helped significantly with his postoperative right radiating arm pain. He continues to have some left hand weakness and a little bit of weakness in his tricep which were present preoperatively however he feels that he is improving some. He is only doing walking with home therapy. I would like for him to start occupational therapy to work on hand strengthening and we will put in a referral for this. He still requiring pain medication intermittently and would like a refill of his Percocet which was provided to him. We discussed activity escalation and I have advised the patient to lift up to 10 pounds until 6 weeks after surgery, then increase up to 25 pounds until 12 weeks after surgery. After 12 weeks post-op, the patient advised to increase activity as tolerated. he will return to clinic in approximately 4 weeks with Dr. Izora Ribas with cervical xrays prior. He was encouraged to call the office in the interim should he have  any questions or concerns. He expressed understanding was in agreement with this plan.   ? Patient Stated Goals Pt reports he would like his left hand stronger and use the hand as much as possible.   ? Currently in Pain? Yes   ? Pain Score 9    ? Pain Location Leg   ? Pain Orientation Right   ? Pain Descriptors / Indicators Aching;Spasm   ? Pain Type Acute pain   ? Pain Onset Today   ? Pain Frequency Intermittent   ? ?  ?  ? ?  ? ? ?Pt reports increased pain in right LE this date and had difficulty walking to the clinic.  He reports he did not want to ask for help or use a transport chair, took multiple breaks to get to the clinic.   ? ?Ice applied to right leg and therapist used therapeutic roller to right leg/thigh and to IT band to decrease spasm and tightness to leg.  Pt  demonstrated difficulty tolerating.   ? ?Pt seen for focus on Grip strength with use of green and red band hand gripper for 3 sets of 20 reps each with short rest breaks between sets.   ?3# weight for elbow, flexion/extension,  wrist flexion/

## 2021-11-23 ENCOUNTER — Ambulatory Visit: Payer: PPO | Admitting: Occupational Therapy

## 2021-11-23 DIAGNOSIS — R278 Other lack of coordination: Secondary | ICD-10-CM

## 2021-11-23 DIAGNOSIS — M6281 Muscle weakness (generalized): Secondary | ICD-10-CM | POA: Diagnosis not present

## 2021-11-24 ENCOUNTER — Encounter: Payer: Self-pay | Admitting: Occupational Therapy

## 2021-11-24 NOTE — Therapy (Signed)
[place] [name] ?[place], [place], [address] ?Phone: [phone]   Fax:  [phone] ? ?Occupational Therapy Treatment ? ?Patient Details  ?Name: [name] ?MRN: [id number] ?Date of Birth: [birth year] ?Referring Provider (OT): Loleta Dicker, PA ? ? ?Encounter Date: 11/23/2021 ? ? OT End of Session - 11/24/21 2018   ? ? Visit Number 6   ? Number of Visits 24   ? Date for OT Re-Evaluation 01/26/22   ? OT Start Time 1100   ? OT Stop Time 1147   ? OT Time Calculation (min) 47 min   ? Activity Tolerance Patient tolerated treatment well   ? Behavior During Therapy [facility] for tasks assessed/performed   ? ?  ?  ? ?  ? ? ?Past Medical History:  ?Diagnosis Date  ? Allergic rhinitis 07/1998  ? Aortic atherosclerosis ([place]) 09/17/2016  ? Arthritis   ? BACK AND RIGHT KNEE  ? CAD (coronary artery disease) 07/01/2015  ? a.) LHC 07/01/2015: normal LV function; 30% p-mLAD, 20% p-mRCA; intervention deferred opting for medical mgmt.  ? COPD (chronic obstructive pulmonary disease) ([place]) 07/1998  ? centrilobular emphysema (09/2016) w ongoing tobacco use, spirometry (02/2013)  ? DDD (degenerative disc disease), cervical   ? DOE (dyspnea on exertion)   ? Erectile dysfunction   ? a.) on PDE5i (sildenafil)  ? ETOH abuse   ? a.) consumes 4-5 beers on a daily basis  ? History of exposure to asbestos   ? HTN (hypertension) 05/1998  ? Hyperlipidemia 06/1996  ? Hypertensive retinopathy   ? Long term current use of anticoagulant   ? a.) apixaban  ? Lumbar spinal stenosis 03/2013  ? a.) s/p laminectomy  ? Macrocytic anemia   ? Migraines   ? Peripheral vascular disease ([place])   ? Persistent atrial fibrillation (HCC)   ? a.) CHA2DS2-VASc = 3 (age, HTN, aortic plaque). b.) rate/rhythm maintained on oral metoprolol tartrate; chronically anticoagulated with apixaban  ? 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 neg viral load - likely cleared infection  ? PVC's (premature  ventricular contractions)   ? Sciatic nerve injury   ? RESOLVED AFTER SURGERY  ? Smoker   ? ? ?Past Surgical History:  ?Procedure Laterality Date  ? ANTERIOR CERVICAL DECOMP/DISCECTOMY FUSION  2003  ? C3-7  ? APPLICATION OF INTRAOPERATIVE CT SCAN N/A 10/11/2021  ? Procedure: APPLICATION OF INTRAOPERATIVE CT SCAN;  Surgeon: Meade Maw, MD;  Location: [facility];  Service: Neurosurgery;  Laterality: N/A;  ? BACK SURGERY  2007  ? CARDIAC CATHETERIZATION N/A 07/01/2015  ? Procedure: Left Heart Cath and Coronary Angiography;  Surgeon: Wellington Hampshire, MD;  Location: [place] [facility];  Service: Cardiovascular;  Laterality: N/A;  ? CARDIOVERSION N/A 05/06/2020  ? Procedure: CARDIOVERSION;  Surgeon: Kate Sable, MD;  Location: [facility];  Service: Cardiovascular;  Laterality: N/A;  ? COLONOSCOPY  09/2010  ? HPx2, diverticula, hemorrhoids, rpt 10 yrs [facility] [place])  ? COLONOSCOPY  01/07/2021  ? [place] [facility] [place])  ? CYSTOSCOPY WITH INSERTION OF UROLIFT N/A 11/02/2020  ? Procedure: CYSTOSCOPY WITH INSERTION OF UROLIFT;  Surgeon: Hollice Espy, MD;  Location: [facility];  Service: Urology;  Laterality: N/A;  ? ETT  2010  ? normal, ABIs normal  ? KNEE ARTHROSCOPY Right 2011  ? cartlage, ligaments  ? KNEE ARTHROSCOPY Left 1984  ? LUMBAR LAMINECTOMY  06/2020  ? Bilateral L2-3 laminectomy [name])  ? LUMBAR LAMINECTOMY/DECOMPRESSION MICRODISCECTOMY Left 03/27/2013  ? Procedure:  LUMBAR LAMINECTOMY/DECOMPRESSION MICRODISCECTOMY 2 LEVELS;  Surgeon: Ophelia Charter, MD;  Location: [facility];  Service: Neurosurgery;  Laterality: Left;  LEFT Lumbar Three Four diskectomy with Lumbar Three-Four, Four-Five Laminectomy. L3/4 ahd L4/5 for LSS [name])   ? LUMBAR LAMINECTOMY/DECOMPRESSION MICRODISCECTOMY Bilateral 07/15/2020  ? Procedure: LAMINECTOMY AND FORAMINOTOMY BILATERAL LUMBAR TWO-LUMBAR THREE;  Surgeon: Newman Pies, MD;  Location: [facility];  Service: Neurosurgery;  Laterality: Bilateral;  ? POSTERIOR CERVICAL  FUSION/FORAMINOTOMY N/A 05/14/2020  ? Procedure: LAMINECTOMY, POSTERIOR INSTRUMENTATION AND FUSION, CERVICAL ONE- CERVICAL TWO, CERVICAL TWO- CERVICAL THREE;  Surgeon: Newman Pies, MD;  Location: [place];  Service: Neurosurgery;  Laterality: N/A;  ? POSTERIOR CERVICAL FUSION/FORAMINOTOMY N/A 10/11/2021  ? Procedure: C6-T3 POSTERIOR FUSION;  Surgeon: Meade Maw, MD;  Location: [facility];  Service: Neurosurgery;  Laterality: N/A;  ? POSTERIOR CERVICAL LAMINECTOMY N/A 10/11/2021  ? Procedure: C6-T2 POSTERIOR SPINAL DECOMPRESSION;  Surgeon: Meade Maw, MD;  Location: [facility];  Service: Neurosurgery;  Laterality: N/A;  ? SHOULDER ARTHROSCOPY Right 2011  ? spriometry  02/2013  ? COPD with some restriction thought to be obesity related ([place])  ? TOTAL KNEE ARTHROPLASTY Right 06/27/2014  ? Procedure: RIGHT TOTAL KNEE ARTHROPLASTY;  Surgeon: Mcarthur Rossetti, MD;  [facility];  Service: Orthopedics;  Laterality: Right;  ? US ECHOCARDIOGRAPHY  01/2013  ? WNL, mild diastolic dysfunction  ? ? ?There were no vitals filed for this visit. ? ? Subjective Assessment - 11/24/21 2016   ? ? Subjective  Pt reports his leg was hurting last week for several days, he was not able to come to his last treatment session due to the pain.  He has an appt with an orthopedic MD soon to get it checked.  He was able to walk down to therapy appts but did use [employer] services today as recommended by therapist.   ? Pertinent History Per chart: [name] is a 71 y.o s/p C6-T3 PSF. C6-7 and T1-2 foraminotomies on 10/11/21 with Dr. Izora Ribas. His interoperative course was uncomplicated and he was admitted for therapy evaluation and drain monitoring. PT and OT recommended discharge home with home health. His drain output was monitored for 2 days and removed on POD2 when the output reduced to an acceptable level. He was given prescriptions for pain medication and muscle relaxant. Pt has a history of multiple surgeries  Past  Surgery: cervical surgery x 2 (about 20 years ago and in 2021), lumbar surgery about 10 years ago, lumbar surgery about 5 years ago, lumbar surgery (L1-3 decompression) 06/2020 with Dr [name] .  Per  follow up MD note after surgery:  GEAN LAROSE is doing fair after cervical decompression and fusion. We restarted his gabapentin which is helped significantly with his postoperative right radiating arm pain. He continues to have some left hand weakness and a little bit of weakness in his tricep which were present preoperatively however he feels that he is improving some. He is only doing walking with home therapy. I would like for him to start occupational therapy to work on hand strengthening and we will put in a referral for this. He still requiring pain medication intermittently and would like a refill of his Percocet which was provided to him. We discussed activity escalation and I have advised the patient to lift up to 10 pounds until 6 weeks after surgery, then increase up to 25 pounds until 12 weeks after surgery. After 12 weeks post-op, the patient advised to increase activity as tolerated. he will  return to clinic in approximately 4 weeks with Dr. Izora Ribas with cervical xrays prior. He was encouraged to call the office in the interim should he have any questions or concerns. He expressed understanding was in agreement with this plan.   ? Patient Stated Goals Pt reports he would like his left hand stronger and use the hand as much as possible.   ? Currently in Pain? Yes   ? Pain Score 5    ? Pain Location Leg   ? Pain Orientation Right   ? Pain Descriptors / Indicators Aching   ? Pain Type Acute pain   ? Pain Onset In the past 7 days   ? Pain Frequency Intermittent   ? ?  ?  ? ?  ? ?Therapeutic Exercises: ? ?Pt seen for left hand Grip strengthening with focus on sustained grip strength patterns with resistance at 4th setting, 23.4# for 20 reps for 2 sets.  ?Use of Resistive Velcro board with associated  tools ?Large barrel for resistive Finger extension to push and pull backwards with emphasis on middle, ring and small finger extension.  Multiple sets and reps completed with cues for finger extension and positioning.  ?Pt performi

## 2021-11-25 ENCOUNTER — Encounter: Payer: Self-pay | Admitting: Cardiology

## 2021-11-25 ENCOUNTER — Ambulatory Visit: Payer: PPO | Admitting: Cardiology

## 2021-11-25 VITALS — BP 130/80 | HR 74 | Ht 66.0 in | Wt 191.0 lb

## 2021-11-25 DIAGNOSIS — I4819 Other persistent atrial fibrillation: Secondary | ICD-10-CM | POA: Diagnosis not present

## 2021-11-25 DIAGNOSIS — F17200 Nicotine dependence, unspecified, uncomplicated: Secondary | ICD-10-CM

## 2021-11-25 DIAGNOSIS — I10 Essential (primary) hypertension: Secondary | ICD-10-CM | POA: Diagnosis not present

## 2021-11-25 DIAGNOSIS — I2510 Atherosclerotic heart disease of native coronary artery without angina pectoris: Secondary | ICD-10-CM

## 2021-11-25 NOTE — Patient Instructions (Signed)

## 2021-11-25 NOTE — Progress Notes (Signed)
?Cardiology Office Note:   ? ?Date:  11/25/2021  ? ?ID:  DREXEL IVEY, DOB 10/26/1950, MRN [id number] ? ?PCP:  Ria Bush, MD  ?[place] Cardiologist:  Kate Sable, MD  ?[facility] Electrophysiologist:  None  ? ?Referring MD: Ria Bush, MD  ? ?Chief Complaint  ?Patient presents with  ? OTher  ?  6 month follow up -- Meds reviewed verbally with patient.   ? ? ?History of Present Illness:   ? ?[name] is a 71 y.o. male with a hx of persistent A. fib on Eliquis, nonobstructive CAD (LHC 2016 LAD 30%, RCA 20%), hypertension, hyperlipidemia,  COPD, current smoker x40+ years, DDD, who presents for follow-up.  ? ?Being seen for atrial fibrillation, had recent upper back surgery due to degenerative disc disease.  Eliquis held for about a week after procedure, patient has not been taking Eliquis since.  He feels well, denies palpitations.  Has occasional bruises on the arms.  Blood pressure previously elevated, lisinopril increased to 20 mg daily with good effect. ? ? ?Prior notes  ?patient with history of neck pains, back pains with radiculopathy.  Diagnosed with cervical spinal stenosis.  Laminectomy is being planned.  Saw primary care provider for preop exam, EKG noted to be in atrial fibrillation rapid ventricular response.  He was started on Eliquis and Lopressor.  ?Echo 03/2020 showed low normal systolic function, EF 05%. ?DC cardioversion was attempted on 05/06/2020 unsuccessfully. ?DC cardioversion attempt on 04/2020 was unsuccessful.  Evaluated by EP, deemed not a good candidate for follow-up ablation given history of COPD and ongoing smoker.    ? ?Past Medical History:  ?Diagnosis Date  ? Allergic rhinitis 07/1998  ? Aortic atherosclerosis ([place]) 09/17/2016  ? Arthritis   ? BACK AND RIGHT KNEE  ? CAD (coronary artery disease) 07/01/2015  ? a.) LHC 07/01/2015: normal LV function; 30% p-mLAD, 20% p-mRCA; intervention deferred opting for medical mgmt.  ? COPD (chronic obstructive  pulmonary disease) ([place]) 07/1998  ? centrilobular emphysema (09/2016) w ongoing tobacco use, spirometry (02/2013)  ? DDD (degenerative disc disease), cervical   ? DOE (dyspnea on exertion)   ? Erectile dysfunction   ? a.) on PDE5i (sildenafil)  ? ETOH abuse   ? a.) consumes 4-5 beers on a daily basis  ? History of exposure to asbestos   ? HTN (hypertension) 05/1998  ? Hyperlipidemia 06/1996  ? Hypertensive retinopathy   ? Long term current use of anticoagulant   ? a.) apixaban  ? Lumbar spinal stenosis 03/2013  ? a.) s/p laminectomy  ? Macrocytic anemia   ? Migraines   ? Peripheral vascular disease ([place])   ? Persistent atrial fibrillation (HCC)   ? a.) CHA2DS2-VASc = 3 (age, HTN, aortic plaque). b.) rate/rhythm maintained on oral metoprolol tartrate; chronically anticoagulated with apixaban  ? 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 neg viral load - likely cleared infection  ? PVC's (premature ventricular contractions)   ? Sciatic nerve injury   ? RESOLVED AFTER SURGERY  ? Smoker   ? ? ?Past Surgical History:  ?Procedure Laterality Date  ? ANTERIOR CERVICAL DECOMP/DISCECTOMY FUSION  2003  ? C3-7  ? APPLICATION OF INTRAOPERATIVE CT SCAN N/A 10/11/2021  ? Procedure: APPLICATION OF INTRAOPERATIVE CT SCAN;  Surgeon: Meade Maw, MD;  Location: [facility] ORS;  Service: Neurosurgery;  Laterality: N/A;  ? BACK SURGERY  2007  ? CARDIAC CATHETERIZATION N/A 07/01/2015  ? Procedure: Left Heart Cath and Coronary Angiography;  Surgeon: Mertie Clause  Fletcher Anon, MD;  Location: [place] CV LAB;  Service: Cardiovascular;  Laterality: N/A;  ? CARDIOVERSION N/A 05/06/2020  ? Procedure: CARDIOVERSION;  Surgeon: Kate Sable, MD;  Location: [facility];  Service: Cardiovascular;  Laterality: N/A;  ? COLONOSCOPY  09/2010  ? HPx2, diverticula, hemorrhoids, rpt 10 yrs [place])  ? COLONOSCOPY  01/07/2021  ? [place])  ? CYSTOSCOPY WITH INSERTION OF UROLIFT N/A 11/02/2020  ? Procedure: CYSTOSCOPY WITH INSERTION OF UROLIFT;  Surgeon:  Hollice Espy, MD;  Location: [facility];  Service: Urology;  Laterality: N/A;  ? ETT  2010  ? normal, ABIs normal  ? KNEE ARTHROSCOPY Right 2011  ? cartlage, ligaments  ? KNEE ARTHROSCOPY Left 1984  ? LUMBAR LAMINECTOMY  06/2020  ? Bilateral L2-3 laminectomy [name])  ? LUMBAR LAMINECTOMY/DECOMPRESSION MICRODISCECTOMY Left 03/27/2013  ? Procedure: LUMBAR LAMINECTOMY/DECOMPRESSION MICRODISCECTOMY 2 LEVELS;  Surgeon: Ophelia Charter, MD;  Location: [place] NEURO ORS;  Service: Neurosurgery;  Laterality: Left;  LEFT Lumbar Three Four diskectomy with Lumbar Three-Four, Four-Five Laminectomy. L3/4 ahd L4/5 for LSS [name])   ? LUMBAR LAMINECTOMY/DECOMPRESSION MICRODISCECTOMY Bilateral 07/15/2020  ? Procedure: LAMINECTOMY AND FORAMINOTOMY BILATERAL LUMBAR TWO-LUMBAR THREE;  Surgeon: Newman Pies, MD;  Location: [place];  Service: Neurosurgery;  Laterality: Bilateral;  ? POSTERIOR CERVICAL FUSION/FORAMINOTOMY N/A 05/14/2020  ? Procedure: LAMINECTOMY, POSTERIOR INSTRUMENTATION AND FUSION, CERVICAL ONE- CERVICAL TWO, CERVICAL TWO- CERVICAL THREE;  Surgeon: Newman Pies, MD;  Location: [place];  Service: Neurosurgery;  Laterality: N/A;  ? POSTERIOR CERVICAL FUSION/FORAMINOTOMY N/A 10/11/2021  ? Procedure: C6-T3 POSTERIOR FUSION;  Surgeon: Meade Maw, MD;  Location: [facility];  Service: Neurosurgery;  Laterality: N/A;  ? POSTERIOR CERVICAL LAMINECTOMY N/A 10/11/2021  ? Procedure: C6-T2 POSTERIOR SPINAL DECOMPRESSION;  Surgeon: Meade Maw, MD;  Location: [facility];  Service: Neurosurgery;  Laterality: N/A;  ? SHOULDER ARTHROSCOPY Right 2011  ? spriometry  02/2013  ? COPD with some restriction thought to be obesity related ([place])  ? TOTAL KNEE ARTHROPLASTY Right 06/27/2014  ? Procedure: RIGHT TOTAL KNEE ARTHROPLASTY;  Surgeon: Mcarthur Rossetti, MD;  [facility];  Service: Orthopedics;  Laterality: Right;  ? US ECHOCARDIOGRAPHY  01/2013  ? WNL, mild diastolic dysfunction  ? ? ?Current  Medications: ?Current Meds  ?Medication Sig  ? Albuterol Sulfate (PROAIR RESPICLICK) 315 (90 Base) MCG/ACT AEPB Inhale 2 puffs into the lungs every 6 (six) hours as needed (cough, shortness of breath).  ? apixaban (ELIQUIS) 5 MG TABS tablet Take 5 mg by mouth 2 (two) times daily.  ? atorvastatin (LIPITOR) 10 MG tablet TAKE 1 TABLET(10 MG) BY MOUTH DAILY  ? Cholecalciferol 25 MCG (1000 UT) tablet Take 1,000 Units by mouth daily.  ? Fluticasone-Salmeterol (ADVAIR) 100-50 MCG/DOSE AEPB Inhale 1 puff into the lungs daily as needed (Shortness of breath). WIXELA BRAND  ? gabapentin (NEURONTIN) 300 MG capsule Take by mouth.  ? lisinopril (ZESTRIL) 20 MG tablet Take 1 tablet (20 mg total) by mouth daily.  ? loratadine (CLARITIN) 10 MG tablet Take 10 mg by mouth as needed.  ? methocarbamol (ROBAXIN) 500 MG tablet Take 1 tablet (500 mg total) by mouth every 6 (six) hours as needed for muscle spasms.  ? metoprolol tartrate (LOPRESSOR) 50 MG tablet Take 1 tablet (50 mg total) by mouth 2 (two) times daily.  ? OVER THE COUNTER MEDICATION Take 2 tablets by mouth daily. sulfurzyme  ? OVER THE COUNTER MEDICATION Take 1,500 mg by mouth daily. BLM Capsules - [place] ?750 mg each  ? senna (  SENOKOT) 8.6 MG TABS tablet Take 1 tablet (8.6 mg total) by mouth 2 (two) times daily as needed for mild constipation.  ? sildenafil (VIAGRA) 50 MG tablet Take 50 mg by mouth daily as needed for erectile dysfunction. Taking 1/2 tablet PRN (from [place])  ? tiotropium (SPIRIVA) 18 MCG inhalation capsule Place 18 mcg into inhaler and inhale as needed.  ?  ? ?Allergies:   Chantix [varenicline]  ? ?Social History  ? ?Socioeconomic History  ? Marital status: Married  ?  Spouse name: Not on file  ? Number of children: Not on file  ? Years of education: Not on file  ? Highest education level: Not on file  ?Occupational History  ? Not on file  ?Tobacco Use  ? Smoking status: Some Days  ?  Packs/day: 0.50  ?  Years: 40.00  ?  Pack  years: 20.00  ?  Types: Cigarettes  ? Smokeless tobacco: Never  ? Tobacco comments:  ?  3 cigarattes over the past 3 weeks as of 10-06-21  ?Vaping Use  ? Vaping Use: Never used  ?Substance and Sexual Activity  ? Alcohol use:

## 2021-11-26 ENCOUNTER — Ambulatory Visit: Payer: PPO | Admitting: Occupational Therapy

## 2021-11-26 DIAGNOSIS — M6281 Muscle weakness (generalized): Secondary | ICD-10-CM | POA: Diagnosis not present

## 2021-11-26 DIAGNOSIS — R278 Other lack of coordination: Secondary | ICD-10-CM

## 2021-11-29 ENCOUNTER — Ambulatory Visit: Payer: PPO | Admitting: Occupational Therapy

## 2021-11-29 DIAGNOSIS — M6281 Muscle weakness (generalized): Secondary | ICD-10-CM

## 2021-11-29 DIAGNOSIS — R278 Other lack of coordination: Secondary | ICD-10-CM

## 2021-12-01 ENCOUNTER — Ambulatory Visit: Payer: PPO | Admitting: Occupational Therapy

## 2021-12-04 ENCOUNTER — Encounter: Payer: Self-pay | Admitting: Occupational Therapy

## 2021-12-04 NOTE — Therapy (Signed)
[place] MAIN [facility] SERVICES [address] [place], [place], [address] Phone: [phone]   Fax:  [phone]  Occupational Therapy Treatment  Patient Details  Name: [name] MRN: [id number] Date of Birth: 05/13/1951 Referring Provider (OT): Loleta Dicker, [place]   Encounter Date: 11/26/2021   OT End of Session - 12/03/21 1413     Visit Number 7    Number of Visits 24    Date for OT Re-Evaluation 01/26/22    OT Start Time 1100    OT Stop Time 1147    OT Time Calculation (min) 47 min    Activity Tolerance Patient tolerated treatment well    Behavior During Therapy [facility] for tasks assessed/performed             Past Medical History:  Diagnosis Date   Allergic rhinitis 07/1998   Aortic atherosclerosis ([place]) 09/17/2016   Arthritis    BACK AND RIGHT KNEE   CAD (coronary artery disease) 07/01/2015   a.) LHC 07/01/2015: normal LV function; 30% p-mLAD, 20% p-mRCA; intervention deferred opting for medical mgmt.   COPD (chronic obstructive pulmonary disease) ([place]) 07/1998   centrilobular emphysema (09/2016) w ongoing tobacco use, spirometry (02/2013)   DDD (degenerative disc disease), cervical    DOE (dyspnea on exertion)    Erectile dysfunction    a.) on PDE5i (sildenafil)   ETOH abuse    a.) consumes 4-5 beers on a daily basis   History of exposure to asbestos    HTN (hypertension) 05/1998   Hyperlipidemia 06/1996   Hypertensive retinopathy    Long term current use of anticoagulant    a.) apixaban   Lumbar spinal stenosis 03/2013   a.) s/p laminectomy   Macrocytic anemia    Migraines    Peripheral vascular disease ([place])    Persistent atrial fibrillation (HCC)    a.) CHA2DS2-VASc = 3 (age, HTN, aortic plaque). 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   Smoker     Past Surgical History:  Procedure Laterality Date   ANTERIOR CERVICAL DECOMP/DISCECTOMY FUSION  4332   R5-1   APPLICATION OF INTRAOPERATIVE CT SCAN N/A 10/11/2021   Procedure: APPLICATION OF INTRAOPERATIVE CT SCAN;  Surgeon: Meade Maw, MD;  Location: [facility];  Service: Neurosurgery;  Laterality: N/A;   BACK SURGERY  2007   CARDIAC CATHETERIZATION N/A 07/01/2015   Procedure: Left Heart Cath and Coronary Angiography;  Surgeon: Wellington Hampshire, MD;  Location: [place] CV LAB;  Service: Cardiovascular;  Laterality: N/A;   CARDIOVERSION N/A 05/06/2020   Procedure: CARDIOVERSION;  Surgeon: Kate Sable, MD;  Location: [facility];  Service: Cardiovascular;  Laterality: N/A;   COLONOSCOPY  09/2010   HPx2, diverticula, hemorrhoids, rpt 10 yrs [place])   COLONOSCOPY  01/07/2021   SSA ([place])   CYSTOSCOPY WITH INSERTION OF UROLIFT N/A 11/02/2020   Procedure: CYSTOSCOPY WITH INSERTION OF UROLIFT;  Surgeon: Hollice Espy, MD;  Location: [facility];  Service: Urology;  Laterality: N/A;   ETT  2010   normal, ABIs normal   KNEE ARTHROSCOPY Right 2011   cartlage, ligaments   KNEE ARTHROSCOPY Left 1984   LUMBAR LAMINECTOMY  06/2020   Bilateral L2-3 laminectomy [name])   LUMBAR LAMINECTOMY/DECOMPRESSION MICRODISCECTOMY Left 03/27/2013   Procedure:  LUMBAR LAMINECTOMY/DECOMPRESSION MICRODISCECTOMY 2 LEVELS;  Surgeon: Ophelia Charter, MD;  Location: [place] NEURO ORS;  Service: Neurosurgery;  Laterality: Left;  LEFT Lumbar Three Four diskectomy with Lumbar Three-Four, Four-Five Laminectomy. L3/4 ahd L4/5 for LSS [name])    LUMBAR LAMINECTOMY/DECOMPRESSION MICRODISCECTOMY Bilateral 07/15/2020   Procedure: LAMINECTOMY AND FORAMINOTOMY BILATERAL LUMBAR TWO-LUMBAR THREE;  Surgeon: Newman Pies, MD;  Location: [place];  Service: Neurosurgery;  Laterality: Bilateral;   POSTERIOR CERVICAL  FUSION/FORAMINOTOMY N/A 05/14/2020   Procedure: LAMINECTOMY, POSTERIOR INSTRUMENTATION AND FUSION, CERVICAL ONE- CERVICAL TWO, CERVICAL TWO- CERVICAL THREE;  Surgeon: Newman Pies, MD;  Location: [place];  Service: Neurosurgery;  Laterality: N/A;   POSTERIOR CERVICAL FUSION/FORAMINOTOMY N/A 10/11/2021   Procedure: C6-T3 POSTERIOR FUSION;  Surgeon: Meade Maw, MD;  Location: [facility];  Service: Neurosurgery;  Laterality: N/A;   POSTERIOR CERVICAL LAMINECTOMY N/A 10/11/2021   Procedure: C6-T2 POSTERIOR SPINAL DECOMPRESSION;  Surgeon: Meade Maw, MD;  Location: [facility];  Service: Neurosurgery;  Laterality: N/A;   SHOULDER ARTHROSCOPY Right 2011   spriometry  02/2013   COPD with some restriction thought to be obesity related ([place])   TOTAL KNEE ARTHROPLASTY Right 06/27/2014   Procedure: RIGHT TOTAL KNEE ARTHROPLASTY;  Surgeon: Mcarthur Rossetti, MD;  [facility];  Service: Orthopedics;  Laterality: Right;   US ECHOCARDIOGRAPHY  01/2013   WNL, mild diastolic dysfunction    There were no vitals filed for this visit.   Subjective Assessment - 12/04/21 1411     Subjective  Pt reports his leg is still bothering him and is planning to see the doctor next week.    Pertinent History Per chart: [name] is a 71 y.o s/p C6-T3 PSF. C6-7 and T1-2 foraminotomies on 10/11/21 with Dr. Izora Ribas. His interoperative course was uncomplicated and he was admitted for therapy evaluation and drain monitoring. PT and OT recommended discharge home with home health. His drain output was monitored for 2 days and removed on POD2 when the output reduced to an acceptable level. He was given prescriptions for pain medication and muscle relaxant. Pt has a history of multiple surgeries  Past Surgery: cervical surgery x 2 (about 20 years ago and in 2021), lumbar surgery about 10 years ago, lumbar surgery about 5 years ago, lumbar surgery (L1-3 decompression) 06/2020 with Dr [name] .  Per  follow up  MD note after surgery:  DAMARE [name] is doing fair after cervical decompression and fusion. We restarted his gabapentin which is helped significantly with his postoperative right radiating arm pain. He continues to have some left hand weakness and a little bit of weakness in his tricep which were present preoperatively however he feels that he is improving some. He is only doing walking with home therapy. I would like for him to start occupational therapy to work on hand strengthening and we will put in a referral for this. He still requiring pain medication intermittently and would like a refill of his Percocet which was provided to him. We discussed activity escalation and I have advised the patient to lift up to 10 pounds until 6 weeks after surgery, then increase up to 25 pounds until 12 weeks after surgery. After 12 weeks post-op, the patient advised to increase activity as tolerated. he will return to clinic in approximately 4 weeks with Dr. Izora Ribas with cervical xrays prior. He was encouraged to call the office in the interim should he have any questions or concerns. He expressed understanding was in agreement with this plan.  Patient Stated Goals Pt reports he would like his left hand stronger and use the hand as much as possible.    Currently in Pain? Yes    Pain Score 5     Pain Location Leg    Pain Orientation Right    Pain Descriptors / Indicators Aching    Pain Type Acute pain    Pain Onset In the past 7 days    Pain Frequency Intermittent             Therapeutic Exercise: Patient seen for strengthening and Grip with use of resistive red putty, working towards: Finger extension  Flattening with ring and small Composite fisting and gripping Lateral, three-point and two-point pinches.  Patient required cues for proper form and technique as well as prehension patterns. Medication bottles-sorting with emphasis on release of objects utilizing a variety of sized medication  bottles Patient working towards turning and flipping bottles from into and using supination and pronation Working on opening variety of types of tops of medication bottles, able to manipulate when bottle is bigger to hold   Neuromuscular reeducation: Patient seen for manipulation of ball pegs with alternating thumb middle and thumb ring to remove pegs to place into container   Response to tx: Patient continues to be limited by pain in his right lower extremity.  He was able to slowly ambulate from the check-in to the rehab area but did not use a transport chair, he did use valet parking today.  Patient continues to demonstrate improvements in his left hand and digits for range of motion and strength.  Patient responds well to verbal cues and therapist demonstration for tasks.  He is able to manipulate and open medication bottles with his left hand, difficulty with smaller diameter bottles and child safety locks.  Performs best with larger diameter bottles and that do not require the thumb to progress down and twist the top.  Continued difficulty with opposition to middle ring and small fingers.  Continue to work towards goals and plan of care to maximize safety and independence in necessary daily tasks                  OT Education - 12/04/21 1413     Education Details coordination skills, strength, ROM    Person(s) Educated Patient    Methods Explanation;Demonstration;Tactile cues;Verbal cues;Handout    Comprehension Verbalized understanding;Returned demonstration;Verbal cues required;Need further instruction;Tactile cues required                 OT Long Term Goals - 11/03/21 2112       OT LONG TERM GOAL #1   Title Pt will demonstrate home exercise program with modified independence    Baseline Eval:  no current program    Time 6    Period Weeks    Status New    Target Date 12/15/21      OT LONG TERM GOAL #2   Title Pt to improve left grip strength by 10# to be  able to open jars and containers with modified independence    Baseline eval:  decreased grip on left  compared to right    Time 12    Period Weeks    Status New    Target Date 01/26/22      OT LONG TERM GOAL #3   Title Pt will improve left pinch strength by 5# to be able to cut food with modified independence.    Baseline Eval: requires assist to cut  food    Time 12    Period Weeks    Status New    Target Date 01/26/22      OT LONG TERM GOAL #4   Title Pt will complete bathing and dressing with modified independence.    Baseline Eval:  requires assist to bathe feet, back, increased time to complete bathing and dressing    Time 12    Period Weeks    Status New    Target Date 01/26/22      OT LONG TERM GOAL #5   Title Pt will be able to complete cooking/grilling tasks with modified independence.    Baseline Eval: unable to complete cooking or grilling    Time 12    Period Weeks    Status New    Target Date 01/26/22      Long Term Additional Goals   Additional Long Term Goals Yes      OT LONG TERM GOAL #6   Title Pt will improve FOTO score to 62 or better to demonstrate a clinically relevant change to impact and facilitate greater independence in daily ADL and IADL tasks.    Baseline Eval score 49    Time 12    Period Weeks    Target Date 01/26/22                   Plan - 12/03/21 1414     Clinical Impression Statement Patient continues to be limited by pain in his right lower extremity.  He was able to slowly ambulate from the check-in to the rehab area but did not use a transport chair, he did use valet parking today.  Patient continues to demonstrate improvements in his left hand and digits for range of motion and strength.  Patient responds well to verbal cues and therapist demonstration for tasks.  He is able to manipulate and open medication bottles with his left hand, difficulty with smaller diameter bottles and child safety locks.  Performs best with larger  diameter bottles and that do not require the thumb to progress down and twist the top.  Continued difficulty with opposition to middle ring and small fingers.  Continue to work towards goals and plan of care to maximize safety and independence in necessary daily tasks    OT Occupational Profile and History Problem Focused Assessment - Including review of records relating to presenting problem    Occupational performance deficits (Please refer to evaluation for details): ADL's;IADL's;Leisure    Body Structure / Function / Physical Skills ADL;GMC;UE functional use;Sensation;Flexibility;IADL;Pain;FMC;Dexterity;Strength;ROM    Psychosocial Skills Environmental  Adaptations;Habits;Routines and Behaviors    Rehab Potential Good    Clinical Decision Making Several treatment options, min-mod task modification necessary    Comorbidities Affecting Occupational Performance: Presence of comorbidities impacting occupational performance    Comorbidities impacting occupational performance description: extensive surgical history, currently with [place] J brace, pain    Modification or Assistance to Complete Evaluation  No modification of tasks or assist necessary to complete eval    OT Frequency 2x / week    OT Duration 12 weeks    OT Treatment/Interventions Self-care/ADL training;Cryotherapy;Paraffin;Therapeutic exercise;DME and/or AE instruction;Electrical Stimulation;Neuromuscular education;Manual Therapy;Splinting;Moist Heat;Contrast Bath;Passive range of motion;Therapeutic activities;Patient/family education;Coping strategies training    Consulted and Agree with Plan of Care Patient             Patient will benefit from skilled therapeutic intervention in order to improve the following deficits and impairments:   Body Structure / Function /  Physical Skills: ADL, GMC, UE functional use, Sensation, Flexibility, IADL, Pain, FMC, Dexterity, Strength, ROM   Psychosocial Skills: Environmental  Adaptations,  Habits, Routines and Behaviors   Visit Diagnosis: Muscle weakness (generalized)  Other lack of coordination    Problem List Patient Active Problem List   Diagnosis Date Noted   S/P cervical spinal fusion 10/11/2021   Aortic valve calcification 07/19/2021   Right leg pain 01/05/2021   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 [facility]) 05/14/2020   Spinal stenosis of lumbar region with neurogenic claudication 04/21/2020   Incomplete emptying of bladder 04/08/2020   Pre-op evaluation 03/27/2020   Persistent atrial fibrillation ([place]) 03/27/2020   Macrocytic anemia 06/20/2019   Lumbar radiculopathy, chronic 04/24/2019   Kidney cyst, acquired 12/12/2017   Personal history of tobacco use, presenting hazards to health 09/27/2016   Aortic atherosclerosis ([place]) 09/15/2016   CAD (coronary artery disease) 09/15/2016   Tinea pedis of both feet 03/07/2016   Tobacco abuse 09/30/2015   Advanced care planning/counseling discussion 09/03/2014   Primary osteoarthritis of right knee 06/27/2014   Status post total right knee replacement 06/27/2014   Habitual alcohol use 12/08/2013   Initial Medicare annual wellness visit 08/25/2011   Vitamin D deficiency 08/26/2010   Obesity, Class I, BMI 30-34.9 08/26/2010   BPH (benign prostatic hyperplasia) 08/26/2010   Onychomycosis 02/08/2010   PREMATURE VENTRICULAR CONTRACTIONS 11/17/2009   PVD 11/06/2008   Snoring 11/06/2008   HLD (hyperlipidemia) 07/31/2007   Essential hypertension 07/31/2007   Allergic rhinitis 07/31/2007   COPD (chronic obstructive pulmonary disease) ([place]) 07/31/2007   DEGENERATIVE DISC DISEASE, CERVICAL SPINE 07/31/2007   Prediabetes 07/31/2007   History of hepatitis C 07/31/2007   Seher Schlagel T Khalil Belote, OTR/L, CLT  Juliette Standre, OT 12/04/2021, 2:27 PM  [place] MAIN [facility] SERVICES [address] [place], [place], [address] Phone: [phone]   Fax:   [phone]  Name: OLEN EAVES MRN: [id number] Date of Birth: May 07, 1951

## 2021-12-04 NOTE — Therapy (Signed)
[place] MAIN [facility] SERVICES [address] [place], [place], [address] Phone: [phone]   Fax:  [phone]  Occupational Therapy Treatment  Patient Details  Name: [name] MRN: [id number] Date of Birth: Aug 09, 1950 Referring Provider (OT): Loleta Dicker, [place]   Encounter Date: 11/29/2021   OT End of Session - 12/04/21 1450     Visit Number 8    Number of Visits 24    Date for OT Re-Evaluation 01/26/22    OT Start Time 1028    OT Stop Time 1100    OT Time Calculation (min) 32 min    Activity Tolerance Patient tolerated treatment well    Behavior During Therapy [facility] for tasks assessed/performed             Past Medical History:  Diagnosis Date   Allergic rhinitis 07/1998   Aortic atherosclerosis ([place]) 09/17/2016   Arthritis    BACK AND RIGHT KNEE   CAD (coronary artery disease) 07/01/2015   a.) LHC 07/01/2015: normal LV function; 30% p-mLAD, 20% p-mRCA; intervention deferred opting for medical mgmt.   COPD (chronic obstructive pulmonary disease) ([place]) 07/1998   centrilobular emphysema (09/2016) w ongoing tobacco use, spirometry (02/2013)   DDD (degenerative disc disease), cervical    DOE (dyspnea on exertion)    Erectile dysfunction    a.) on PDE5i (sildenafil)   ETOH abuse    a.) consumes 4-5 beers on a daily basis   History of exposure to asbestos    HTN (hypertension) 05/1998   Hyperlipidemia 06/1996   Hypertensive retinopathy    Long term current use of anticoagulant    a.) apixaban   Lumbar spinal stenosis 03/2013   a.) s/p laminectomy   Macrocytic anemia    Migraines    Peripheral vascular disease ([place])    Persistent atrial fibrillation ([facility])    a.) CHA2DS2-VASc = 3 (age, HTN, aortic plaque). 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   Smoker     Past Surgical History:  Procedure Laterality Date   ANTERIOR CERVICAL DECOMP/DISCECTOMY FUSION  0623   J6-2   APPLICATION OF INTRAOPERATIVE CT SCAN N/A 10/11/2021   Procedure: APPLICATION OF INTRAOPERATIVE CT SCAN;  Surgeon: Meade Maw, MD;  Location: [facility] ORS;  Service: Neurosurgery;  Laterality: N/A;   BACK SURGERY  2007   CARDIAC CATHETERIZATION N/A 07/01/2015   Procedure: Left Heart Cath and Coronary Angiography;  Surgeon: Wellington Hampshire, MD;  Location: [place] CV LAB;  Service: Cardiovascular;  Laterality: N/A;   CARDIOVERSION N/A 05/06/2020   Procedure: CARDIOVERSION;  Surgeon: Kate Sable, MD;  Location: [facility] ORS;  Service: Cardiovascular;  Laterality: N/A;   COLONOSCOPY  09/2010   HPx2, diverticula, hemorrhoids, rpt 10 yrs [place])   COLONOSCOPY  01/07/2021   SSA ([place])   CYSTOSCOPY WITH INSERTION OF UROLIFT N/A 11/02/2020   Procedure: CYSTOSCOPY WITH INSERTION OF UROLIFT;  Surgeon: Hollice Espy, MD;  Location: [facility] ORS;  Service: Urology;  Laterality: N/A;   ETT  2010   normal, ABIs normal   KNEE ARTHROSCOPY Right 2011   [place], ligaments   KNEE ARTHROSCOPY Left 1984   LUMBAR LAMINECTOMY  06/2020   Bilateral L2-3 laminectomy [name])   LUMBAR LAMINECTOMY/DECOMPRESSION MICRODISCECTOMY Left 03/27/2013   Procedure:  LUMBAR LAMINECTOMY/DECOMPRESSION MICRODISCECTOMY 2 LEVELS;  Surgeon: Ophelia Charter, MD;  Location: [place] NEURO ORS;  Service: Neurosurgery;  Laterality: Left;  LEFT Lumbar Three Four diskectomy with Lumbar Three-Four, Four-Five Laminectomy. L3/4 ahd L4/5 for LSS [name])    LUMBAR LAMINECTOMY/DECOMPRESSION MICRODISCECTOMY Bilateral 07/15/2020   Procedure: LAMINECTOMY AND FORAMINOTOMY BILATERAL LUMBAR TWO-LUMBAR THREE;  Surgeon: Newman Pies, MD;  Location: [place];  Service: Neurosurgery;  Laterality: Bilateral;   POSTERIOR CERVICAL  FUSION/FORAMINOTOMY N/A 05/14/2020   Procedure: LAMINECTOMY, POSTERIOR INSTRUMENTATION AND FUSION, CERVICAL ONE- CERVICAL TWO, CERVICAL TWO- CERVICAL THREE;  Surgeon: Newman Pies, MD;  Location: [place];  Service: Neurosurgery;  Laterality: N/A;   POSTERIOR CERVICAL FUSION/FORAMINOTOMY N/A 10/11/2021   Procedure: C6-T3 POSTERIOR FUSION;  Surgeon: Meade Maw, MD;  Location: [facility] ORS;  Service: Neurosurgery;  Laterality: N/A;   POSTERIOR CERVICAL LAMINECTOMY N/A 10/11/2021   Procedure: C6-T2 POSTERIOR SPINAL DECOMPRESSION;  Surgeon: Meade Maw, MD;  Location: [facility] ORS;  Service: Neurosurgery;  Laterality: N/A;   SHOULDER ARTHROSCOPY Right 2011   spriometry  02/2013   COPD with some restriction thought to be obesity related ([place])   TOTAL KNEE ARTHROPLASTY Right 06/27/2014   Procedure: RIGHT TOTAL KNEE ARTHROPLASTY;  Surgeon: Mcarthur Rossetti, MD;  Location: [facility];  Service: Orthopedics;  Laterality: Right;   US ECHOCARDIOGRAPHY  01/2013   WNL, mild diastolic dysfunction    There were no vitals filed for this visit.   Subjective Assessment - 12/04/21 1447     Subjective  After 30 minutes, patient states, " I have had enough, I just can't do anymore."  Pain in his right leg bothering him.  He ambulated to clinic but therapist took him back upstairs in transport chair today.    Pertinent History Per chart: [name] is a 71 y.o s/p C6-T3 PSF. C6-7 and T1-2 foraminotomies on 10/11/21 with Dr. Izora Ribas. His interoperative course was uncomplicated and he was admitted for therapy evaluation and drain monitoring. PT and OT recommended discharge home with home health. His drain output was monitored for 2 days and removed on POD2 when the output reduced to an acceptable level. He was given prescriptions for pain medication and muscle relaxant. Pt has a history of multiple surgeries  Past Surgery: cervical surgery x 2 (about 20 years ago and in 2021), lumbar surgery about 10  years ago, lumbar surgery about 5 years ago, lumbar surgery (L1-3 decompression) 06/2020 with Dr [name] .  Per  follow up MD note after surgery:  [name] is doing fair after cervical decompression and fusion. We restarted his gabapentin which is helped significantly with his postoperative right radiating arm pain. He continues to have some left hand weakness and a little bit of weakness in his tricep which were present preoperatively however he feels that he is improving some. He is only doing walking with home therapy. I would like for him to start occupational therapy to work on hand strengthening and we will put in a referral for this. He still requiring pain medication intermittently and would like a refill of his Percocet which was provided to him. We discussed activity escalation and I have advised the patient to lift up to 10 pounds until 6 weeks after surgery, then increase up to 25 pounds until 12 weeks after surgery. After 12 weeks post-op, the patient advised to increase activity as tolerated. he will return to clinic in approximately 4 weeks with Dr. Izora Ribas with cervical xrays prior. He was encouraged to call the office in  the interim should he have any questions or concerns. He expressed understanding was in agreement with this plan.    Patient Stated Goals Pt reports he would like his left hand stronger and use the hand as much as possible.    Currently in Pain? Yes    Pain Score 7     Pain Location Leg    Pain Orientation Right    Pain Descriptors / Indicators Aching    Pain Type Acute pain    Pain Onset In the past 7 days    Pain Frequency Intermittent             Patient reports pain, 7/10 leg , difficulty walking today.  Therapeutic exercises: Patient seen this date for left hand range of motion and strengthening with use of resistive Blue putty for finger extension, pulling putty with two-point and three-point pinches.  Rolling putty out into a log roll hand  performing lateral pinch, three-point pinch and two-point pinches.  Using bilateral hands to form small meatballs, and then working towards flattening with finger extension.  Patient requires verbal cues and therapist demonstration for proper form and technique.  Use of Weight bearing at times to facilitate finger extension. Patient seen for left hand strengthening for grip with 4th setting, 20 reps for 2 sets, occasional cues for proper hand placement on resistive grip strengthener.  Patient then decided to cut session short due to increased pain in his right lower extremity, he had difficulty focusing on tasks and reported frustration with his performance since his hand was not working as well as in the previous sessions.  Response to treatment: Patient remained limited this week with engagement in therapy due to pain in his right lower extremity.  He ambulated down to the clinic and was resistant to have therapist get a transport chair however he was a fall risk with the increased pain and difficulty walking.  He is following up with the physician towards the end of this week for further assessment.  Patient had difficulty concentrating and focusing in session today due to pain, and reported frustration with his performance.  We will continue to work towards left hand strengthening range of motion and improved functional use for daily tasks.                     OT Education - 12/04/21 1450     Education Details coordination skills, strength, ROM    Person(s) Educated Patient    Methods Explanation;Demonstration;Tactile cues;Verbal cues;Handout    Comprehension Verbalized understanding;Returned demonstration;Verbal cues required;Need further instruction;Tactile cues required                 OT Long Term Goals - 11/03/21 2112       OT LONG TERM GOAL #1   Title Pt will demonstrate home exercise program with modified independence    Baseline Eval:  no current program     Time 6    Period Weeks    Status New    Target Date 12/15/21      OT LONG TERM GOAL #2   Title Pt to improve left grip strength by 10# to be able to open jars and containers with modified independence    Baseline eval:  decreased grip on left  compared to right    Time 12    Period Weeks    Status New    Target Date 01/26/22      OT LONG TERM GOAL #3  Title Pt will improve left pinch strength by 5# to be able to cut food with modified independence.    Baseline Eval: requires assist to cut food    Time 12    Period Weeks    Status New    Target Date 01/26/22      OT LONG TERM GOAL #4   Title Pt will complete bathing and dressing with modified independence.    Baseline Eval:  requires assist to bathe feet, back, increased time to complete bathing and dressing    Time 12    Period Weeks    Status New    Target Date 01/26/22      OT LONG TERM GOAL #5   Title Pt will be able to complete cooking/grilling tasks with modified independence.    Baseline Eval: unable to complete cooking or grilling    Time 12    Period Weeks    Status New    Target Date 01/26/22      Long Term Additional Goals   Additional Long Term Goals Yes      OT LONG TERM GOAL #6   Title Pt will improve FOTO score to 62 or better to demonstrate a clinically relevant change to impact and facilitate greater independence in daily ADL and IADL tasks.    Baseline Eval score 49    Time 12    Period Weeks    Target Date 01/26/22                   Plan - 12/04/21 1450     Clinical Impression Statement Patient remained limited this week with engagement in therapy due to pain in his right lower extremity.  He ambulated down to the clinic and was resistant to have therapist get a transport chair however he was a fall risk with the increased pain and difficulty walking.  He is following up with the physician towards the end of this week for further assessment.  Patient had difficulty concentrating and  focusing in session today due to pain, and reported frustration with his performance.  We will continue to work towards left hand strengthening range of motion and improved functional use for daily tasks.    OT Occupational Profile and History Problem Focused Assessment - Including review of records relating to presenting problem    Occupational performance deficits (Please refer to evaluation for details): ADL's;IADL's;Leisure    Body Structure / Function / Physical Skills ADL;GMC;UE functional use;Sensation;Flexibility;IADL;Pain;FMC;Dexterity;Strength;ROM    Psychosocial Skills Environmental  Adaptations;Habits;Routines and Behaviors    Rehab Potential Good    Clinical Decision Making Several treatment options, min-mod task modification necessary    Comorbidities Affecting Occupational Performance: Presence of comorbidities impacting occupational performance    Comorbidities impacting occupational performance description: extensive surgical history, currently with [place] J brace, pain    Modification or Assistance to Complete Evaluation  No modification of tasks or assist necessary to complete eval    OT Frequency 2x / week    OT Duration 12 weeks    OT Treatment/Interventions Self-care/ADL training;Cryotherapy;Paraffin;Therapeutic exercise;DME and/or AE instruction;Electrical Stimulation;Neuromuscular education;Manual Therapy;Splinting;Moist Heat;Contrast Bath;Passive range of motion;Therapeutic activities;Patient/family education;Coping strategies training    Consulted and Agree with Plan of Care Patient             Patient will benefit from skilled therapeutic intervention in order to improve the following deficits and impairments:   Body Structure / Function / Physical Skills: ADL, GMC, UE functional use, Sensation, Flexibility, IADL, Pain, FMC,  Dexterity, Strength, ROM   Psychosocial Skills: Environmental  Adaptations, Habits, Routines and Behaviors   Visit Diagnosis: Muscle  weakness (generalized)  Other lack of coordination    Problem List Patient Active Problem List   Diagnosis Date Noted   S/P cervical spinal fusion 10/11/2021   Aortic valve calcification 07/19/2021   Right leg pain 01/05/2021   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 [facility]) 05/14/2020   Spinal stenosis of lumbar region with neurogenic claudication 04/21/2020   Incomplete emptying of bladder 04/08/2020   Pre-op evaluation 03/27/2020   Persistent atrial fibrillation ([place]) 03/27/2020   Macrocytic anemia 06/20/2019   Lumbar radiculopathy, chronic 04/24/2019   Kidney cyst, acquired 12/12/2017   Personal history of tobacco use, presenting hazards to health 09/27/2016   Aortic atherosclerosis ([place]) 09/15/2016   CAD (coronary artery disease) 09/15/2016   Tinea pedis of both feet 03/07/2016   Tobacco abuse 09/30/2015   Advanced care planning/counseling discussion 09/03/2014   Primary osteoarthritis of right knee 06/27/2014   Status post total right knee replacement 06/27/2014   Habitual alcohol use 12/08/2013   Initial Medicare annual wellness visit 08/25/2011   Vitamin D deficiency 08/26/2010   Obesity, Class I, BMI 30-34.9 08/26/2010   BPH (benign prostatic hyperplasia) 08/26/2010   Onychomycosis 02/08/2010   PREMATURE VENTRICULAR CONTRACTIONS 11/17/2009   PVD 11/06/2008   Snoring 11/06/2008   HLD (hyperlipidemia) 07/31/2007   Essential hypertension 07/31/2007   Allergic rhinitis 07/31/2007   COPD (chronic obstructive pulmonary disease) ([place]) 07/31/2007   DEGENERATIVE DISC DISEASE, CERVICAL SPINE 07/31/2007   Prediabetes 07/31/2007   History of hepatitis C 07/31/2007   Maclean Foister T Jaelie Aguilera, OTR/L, CLT  Adelee Hannula, OT 12/04/2021, 2:59 PM  [place] MAIN [facility] SERVICES [address] [place], [place], [address] Phone: [phone]   Fax:  [phone]  Name: [name] MRN: [id number] Date of Birth:  1951/01/21

## 2021-12-07 ENCOUNTER — Encounter: Payer: Self-pay | Admitting: Occupational Therapy

## 2021-12-07 ENCOUNTER — Ambulatory Visit: Payer: PPO | Admitting: Occupational Therapy

## 2021-12-07 DIAGNOSIS — M6281 Muscle weakness (generalized): Secondary | ICD-10-CM | POA: Diagnosis not present

## 2021-12-07 DIAGNOSIS — R278 Other lack of coordination: Secondary | ICD-10-CM

## 2021-12-09 ENCOUNTER — Ambulatory Visit: Payer: PPO | Admitting: Occupational Therapy

## 2021-12-09 DIAGNOSIS — M6281 Muscle weakness (generalized): Secondary | ICD-10-CM | POA: Diagnosis not present

## 2021-12-09 DIAGNOSIS — R278 Other lack of coordination: Secondary | ICD-10-CM

## 2021-12-09 NOTE — Therapy (Signed)
[place] MAIN [facility] SERVICES [address] [place], [place], [address] Phone: [phone]   Fax:  [phone]  Occupational Therapy Treatment  Patient Details  Name: [name] MRN: [id number] Date of Birth: 11/08/1950 Referring Provider (OT): Loleta Dicker, [place]   Encounter Date: 12/07/2021   OT End of Session - 12/09/21 2147     Visit Number 9    Number of Visits 24    Date for OT Re-Evaluation 01/26/22    OT Start Time 1015    OT Stop Time 1100    OT Time Calculation (min) 45 min    Activity Tolerance Patient tolerated treatment well    Behavior During Therapy [facility] for tasks assessed/performed             Past Medical History:  Diagnosis Date   Allergic rhinitis 07/1998   Aortic atherosclerosis ([place]) 09/17/2016   Arthritis    BACK AND RIGHT KNEE   CAD (coronary artery disease) 07/01/2015   a.) LHC 07/01/2015: normal LV function; 30% p-mLAD, 20% p-mRCA; intervention deferred opting for medical mgmt.   COPD (chronic obstructive pulmonary disease) ([place]) 07/1998   centrilobular emphysema (09/2016) w ongoing tobacco use, spirometry (02/2013)   DDD (degenerative disc disease), cervical    DOE (dyspnea on exertion)    Erectile dysfunction    a.) on PDE5i (sildenafil)   ETOH abuse    a.) consumes 4-5 beers on a daily basis   History of exposure to asbestos    HTN (hypertension) 05/1998   Hyperlipidemia 06/1996   Hypertensive retinopathy    Long term current use of anticoagulant    a.) apixaban   Lumbar spinal stenosis 03/2013   a.) s/p laminectomy   Macrocytic anemia    Migraines    Peripheral vascular disease ([place])    Persistent atrial fibrillation ([facility])    a.) CHA2DS2-VASc = 3 (age, HTN, aortic plaque). 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   Smoker     Past Surgical History:  Procedure Laterality Date   ANTERIOR CERVICAL DECOMP/DISCECTOMY FUSION  9381   O1-7   APPLICATION OF INTRAOPERATIVE CT SCAN N/A 10/11/2021   Procedure: APPLICATION OF INTRAOPERATIVE CT SCAN;  Surgeon: Meade Maw, MD;  Location: [facility];  Service: Neurosurgery;  Laterality: N/A;   BACK SURGERY  2007   CARDIAC CATHETERIZATION N/A 07/01/2015   Procedure: Left Heart Cath and Coronary Angiography;  Surgeon: Wellington Hampshire, MD;  Location: [place] [facility];  Service: Cardiovascular;  Laterality: N/A;   CARDIOVERSION N/A 05/06/2020   Procedure: CARDIOVERSION;  Surgeon: Kate Sable, MD;  Location: [facility];  Service: Cardiovascular;  Laterality: N/A;   COLONOSCOPY  09/2010   HPx2, diverticula, hemorrhoids, rpt 10 yrs [place])   COLONOSCOPY  01/07/2021   SSA ([place])   CYSTOSCOPY WITH INSERTION OF UROLIFT N/A 11/02/2020   Procedure: CYSTOSCOPY WITH INSERTION OF UROLIFT;  Surgeon: Hollice Espy, MD;  Location: [facility];  Service: Urology;  Laterality: N/A;   ETT  2010   normal, ABIs normal   KNEE ARTHROSCOPY Right 2011   [place], ligaments   KNEE ARTHROSCOPY Left 1984   LUMBAR LAMINECTOMY  06/2020   Bilateral L2-3 laminectomy [name])   LUMBAR LAMINECTOMY/DECOMPRESSION MICRODISCECTOMY Left 03/27/2013   Procedure:  LUMBAR LAMINECTOMY/DECOMPRESSION MICRODISCECTOMY 2 LEVELS;  Surgeon: Ophelia Charter, MD;  Location: [place] [facility];  Service: Neurosurgery;  Laterality: Left;  LEFT Lumbar Three Four diskectomy with Lumbar Three-Four, Four-Five Laminectomy. L3/4 ahd L4/5 for LSS [name])    LUMBAR LAMINECTOMY/DECOMPRESSION MICRODISCECTOMY Bilateral 07/15/2020   Procedure: LAMINECTOMY AND FORAMINOTOMY BILATERAL LUMBAR TWO-LUMBAR THREE;  Surgeon: Newman Pies, MD;  Location: [place];  Service: Neurosurgery;  Laterality: Bilateral;   POSTERIOR CERVICAL  FUSION/FORAMINOTOMY N/A 05/14/2020   Procedure: LAMINECTOMY, POSTERIOR INSTRUMENTATION AND FUSION, CERVICAL ONE- CERVICAL TWO, CERVICAL TWO- CERVICAL THREE;  Surgeon: Newman Pies, MD;  Location: [place];  Service: Neurosurgery;  Laterality: N/A;   POSTERIOR CERVICAL FUSION/FORAMINOTOMY N/A 10/11/2021   Procedure: C6-T3 POSTERIOR FUSION;  Surgeon: Meade Maw, MD;  Location: [facility];  Service: Neurosurgery;  Laterality: N/A;   POSTERIOR CERVICAL LAMINECTOMY N/A 10/11/2021   Procedure: C6-T2 POSTERIOR SPINAL DECOMPRESSION;  Surgeon: Meade Maw, MD;  Location: [facility];  Service: Neurosurgery;  Laterality: N/A;   SHOULDER ARTHROSCOPY Right 2011   spriometry  02/2013   COPD with some restriction thought to be obesity related ([place])   TOTAL KNEE ARTHROPLASTY Right 06/27/2014   Procedure: RIGHT TOTAL KNEE ARTHROPLASTY;  Surgeon: Mcarthur Rossetti, MD;  Location: [facility];  Service: Orthopedics;  Laterality: Right;   US ECHOCARDIOGRAPHY  01/2013   WNL, mild diastolic dysfunction    There were no vitals filed for this visit.   Subjective Assessment - 12/08/21 2146     Subjective  Pt reports he went to the doctor and will return on thursday for steriod injections.    Pertinent History Per chart: [name] is a 71 y.o s/p C6-T3 PSF. C6-7 and T1-2 foraminotomies on 10/11/21 with Dr. Izora Ribas. His interoperative course was uncomplicated and he was admitted for therapy evaluation and drain monitoring. PT and OT recommended discharge home with home health. His drain output was monitored for 2 days and removed on POD2 when the output reduced to an acceptable level. He was given prescriptions for pain medication and muscle relaxant. Pt has a history of multiple surgeries  Past Surgery: cervical surgery x 2 (about 20 years ago and in 2021), lumbar surgery about 10 years ago, lumbar surgery about 5 years ago, lumbar surgery (L1-3 decompression) 06/2020 with Dr [name] .  Per  follow up  MD note after surgery:  [name] is doing fair after cervical decompression and fusion. We restarted his gabapentin which is helped significantly with his postoperative right radiating arm pain. He continues to have some left hand weakness and a little bit of weakness in his tricep which were present preoperatively however he feels that he is improving some. He is only doing walking with home therapy. I would like for him to start occupational therapy to work on hand strengthening and we will put in a referral for this. He still requiring pain medication intermittently and would like a refill of his Percocet which was provided to him. We discussed activity escalation and I have advised the patient to lift up to 10 pounds until 6 weeks after surgery, then increase up to 25 pounds until 12 weeks after surgery. After 12 weeks post-op, the patient advised to increase activity as tolerated. he will return to clinic in approximately 4 weeks with Dr. Izora Ribas with cervical xrays prior. He was encouraged to call the office in the interim should he have any questions or concerns. He expressed understanding was in agreement with this plan.    Patient Stated  Goals Pt reports he would like his left hand stronger and use the hand as much as possible.    Currently in Pain? Yes    Pain Score 3     Pain Location Leg    Pain Orientation Right    Pain Descriptors / Indicators Aching    Pain Type Acute pain    Pain Onset 1 to 4 weeks ago    Pain Frequency Intermittent            Therapeutic Exercises:  Pt seen for resistive theraputty with use of tools, soft red putty.  Performed the following:  Knob turn with standard grip, attempts at intrinsic turn.  Cap turn with standard turn, L bar for forearm supination, pronation, ulnar and radial deviation, functional pull  Key turn with standard turn, lateral pinch turn Multiple reps performed of each exercise, cues at times for proper grasping patterns on tools.    Resistive putty without tools, light blue putty utilized for gross gripping patterns, lateral pinch, 3 point and 2 point pinches, rolling out and then forming small meatballs followed by pinches to flatten.    Manipulation of Coins to pick up and place in resistive bank opposition to middle finger today and pushing coins into bank with middle finger extended. Promotion of finger extension during tasks, occasional weight bearing utilized for full stretch.   Response to tx: Pt progressing well towards goals, has an appt this week for steroid injection in hip and thigh area to address recent pain and mobility issues.  Pt has continued to work towards increased finger extension as well as strength in left hand to improve functional use for daily tasks. Responds well to cues for proper form and technique.                      OT Education - 12/09/21 2146     Education Details coordination skills, strength, ROM    Person(s) Educated Patient    Methods Explanation;Demonstration;Tactile cues;Verbal cues;Handout    Comprehension Verbalized understanding;Returned demonstration;Verbal cues required;Need further instruction;Tactile cues required                 OT Long Term Goals - 11/03/21 2112       OT LONG TERM GOAL #1   Title Pt will demonstrate home exercise program with modified independence    Baseline Eval:  no current program    Time 6    Period Weeks    Status New    Target Date 12/15/21      OT LONG TERM GOAL #2   Title Pt to improve left grip strength by 10# to be able to open jars and containers with modified independence    Baseline eval:  decreased grip on left  compared to right    Time 12    Period Weeks    Status New    Target Date 01/26/22      OT LONG TERM GOAL #3   Title Pt will improve left pinch strength by 5# to be able to cut food with modified independence.    Baseline Eval: requires assist to cut food    Time 12    Period Weeks     Status New    Target Date 01/26/22      OT LONG TERM GOAL #4   Title Pt will complete bathing and dressing with modified independence.    Baseline Eval:  requires assist to bathe feet, back, increased time to  complete bathing and dressing    Time 12    Period Weeks    Status New    Target Date 01/26/22      OT LONG TERM GOAL #5   Title Pt will be able to complete cooking/grilling tasks with modified independence.    Baseline Eval: unable to complete cooking or grilling    Time 12    Period Weeks    Status New    Target Date 01/26/22      Long Term Additional Goals   Additional Long Term Goals Yes      OT LONG TERM GOAL #6   Title Pt will improve FOTO score to 62 or better to demonstrate a clinically relevant change to impact and facilitate greater independence in daily ADL and IADL tasks.    Baseline Eval score 49    Time 12    Period Weeks    Target Date 01/26/22                   Plan - 12/08/21 2148     Clinical Impression Statement Pt progressing well towards goals, has an appt this week for steroid injection in hip and thigh area to address recent pain and mobility issues.  Pt has continued to work towards increased finger extension as well as strength in left hand to improve functional use for daily tasks. Responds well to cues for proper form and technique.    OT Occupational Profile and History Problem Focused Assessment - Including review of records relating to presenting problem    Occupational performance deficits (Please refer to evaluation for details): ADL's;IADL's;Leisure    Body Structure / Function / Physical Skills ADL;GMC;UE functional use;Sensation;Flexibility;IADL;Pain;FMC;Dexterity;Strength;ROM    Psychosocial Skills Environmental  Adaptations;Habits;Routines and Behaviors    Rehab Potential Good    Clinical Decision Making Several treatment options, min-mod task modification necessary    Comorbidities Affecting Occupational Performance: Presence  of comorbidities impacting occupational performance    Comorbidities impacting occupational performance description: extensive surgical history, currently with [place] J brace, pain    Modification or Assistance to Complete Evaluation  No modification of tasks or assist necessary to complete eval    OT Frequency 2x / week    OT Duration 12 weeks    OT Treatment/Interventions Self-care/ADL training;Cryotherapy;Paraffin;Therapeutic exercise;DME and/or AE instruction;Electrical Stimulation;Neuromuscular education;Manual Therapy;Splinting;Moist Heat;Contrast Bath;Passive range of motion;Therapeutic activities;Patient/family education;Coping strategies training    Consulted and Agree with Plan of Care Patient             Patient will benefit from skilled therapeutic intervention in order to improve the following deficits and impairments:   Body Structure / Function / Physical Skills: ADL, GMC, UE functional use, Sensation, Flexibility, IADL, Pain, FMC, Dexterity, Strength, ROM   Psychosocial Skills: Environmental  Adaptations, Habits, Routines and Behaviors   Visit Diagnosis: Muscle weakness (generalized)  Other lack of coordination    Problem List Patient Active Problem List   Diagnosis Date Noted   S/P cervical spinal fusion 10/11/2021   Aortic valve calcification 07/19/2021   Right leg pain 01/05/2021   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 [facility]) 05/14/2020   Spinal stenosis of lumbar region with neurogenic claudication 04/21/2020   Incomplete emptying of bladder 04/08/2020   Pre-op evaluation 03/27/2020   Persistent atrial fibrillation ([place]) 03/27/2020   Macrocytic anemia 06/20/2019   Lumbar radiculopathy, chronic 04/24/2019   Kidney cyst, acquired 12/12/2017   Personal history of tobacco use, presenting hazards to  health 09/27/2016   Aortic atherosclerosis ([place]) 09/15/2016   CAD (coronary artery disease) 09/15/2016   Tinea pedis of both  feet 03/07/2016   Tobacco abuse 09/30/2015   Advanced care planning/counseling discussion 09/03/2014   Primary osteoarthritis of right knee 06/27/2014   Status post total right knee replacement 06/27/2014   Habitual alcohol use 12/08/2013   Initial Medicare annual wellness visit 08/25/2011   Vitamin D deficiency 08/26/2010   Obesity, Class I, BMI 30-34.9 08/26/2010   BPH (benign prostatic hyperplasia) 08/26/2010   Onychomycosis 02/08/2010   PREMATURE VENTRICULAR CONTRACTIONS 11/17/2009   PVD 11/06/2008   Snoring 11/06/2008   HLD (hyperlipidemia) 07/31/2007   Essential hypertension 07/31/2007   Allergic rhinitis 07/31/2007   COPD (chronic obstructive pulmonary disease) ([place]) 07/31/2007   DEGENERATIVE [place] DISEASE, CERVICAL SPINE 07/31/2007   Prediabetes 07/31/2007   History of hepatitis C 07/31/2007   Ishmel Acevedo T Miracle Criado, OTR/L, CLT  Xavior Niazi, OT 12/09/2021, 10:11 PM  [place] MAIN [facility] SERVICES [address] [place], [place], [address] Phone: [phone]   Fax:  [phone]  Name: MATHAYUS STANBERY MRN: [id number] Date of Birth: 08-Sep-1950

## 2021-12-10 ENCOUNTER — Encounter: Payer: Self-pay | Admitting: Occupational Therapy

## 2021-12-10 NOTE — Therapy (Signed)
[place] [facility] [address] [place], [place], [address] Phone: [phone]   Fax:  [phone]  Occupational Therapy Treatment/Progress Update Reporting period from 11/02/2021 and to 12/09/2021  Patient Details  Name: [name] MRN: [id number] Date of Birth: 1951/05/23 Referring Provider (OT): Loleta Dicker, [place]   Encounter Date: 12/09/2021   OT End of Session - 12/10/21 1035     Visit Number 10    Number of Visits 24    Date for OT Re-Evaluation 01/26/22    OT Start Time 1015    OT Stop Time 1059    OT Time Calculation (min) 44 min    Activity Tolerance Patient tolerated treatment well    Behavior During Therapy [facility] for tasks assessed/performed             Past Medical History:  Diagnosis Date   Allergic rhinitis 07/1998   Aortic atherosclerosis ([place]) 09/17/2016   Arthritis    BACK AND RIGHT KNEE   CAD (coronary artery disease) 07/01/2015   a.) LHC 07/01/2015: normal LV function; 30% p-mLAD, 20% p-mRCA; intervention deferred opting for medical mgmt.   COPD (chronic obstructive pulmonary disease) ([place]) 07/1998   centrilobular emphysema (09/2016) w ongoing tobacco use, spirometry (02/2013)   DDD (degenerative disc disease), cervical    DOE (dyspnea on exertion)    Erectile dysfunction    a.) on PDE5i (sildenafil)   ETOH abuse    a.) consumes 4-5 beers on a daily basis   History of exposure to asbestos    HTN (hypertension) 05/1998   Hyperlipidemia 06/1996   Hypertensive retinopathy    Long term current use of anticoagulant    a.) apixaban   Lumbar spinal stenosis 03/2013   a.) s/p laminectomy   Macrocytic anemia    Migraines    Peripheral vascular disease ([place])    Persistent atrial fibrillation ([facility])    a.) CHA2DS2-VASc = 3 (age, HTN, aortic plaque). 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   Smoker     Past Surgical History:  Procedure Laterality Date   ANTERIOR CERVICAL DECOMP/DISCECTOMY FUSION  1324   M0-1   APPLICATION OF INTRAOPERATIVE CT SCAN N/A 10/11/2021   Procedure: APPLICATION OF INTRAOPERATIVE CT SCAN;  Surgeon: Meade Maw, MD;  Location: [facility] OR[facility];  Service: Neurosurgery;  Laterality: N/A;   BACK SURGERY  2007   CARDIAC CATHETERIZATION N/A 07/01/2015   Procedure: Left Heart Cath and Coronary Angiography;  Surgeon: Wellington Hampshire, MD;  Location: [place] [facility];  Service: Cardiovascular;  Laterality: N/A;   CARDIOVERSION N/A 05/06/2020   Procedure: CARDIOVERSION;  Surgeon: Kate Sable, MD;  Location: [facility];  Service: Cardiovascular;  Laterality: N/A;   COLONOSCOPY  09/2010   HPx2, diverticula, hemorrhoids, rpt 10 yrs [facility] [place])   COLONOSCOPY  01/07/2021   SSA ([place])   CYSTOSCOPY WITH INSERTION OF UROLIFT N/A 11/02/2020   Procedure: CYSTOSCOPY WITH INSERTION OF UROLIFT;  Surgeon: Hollice Espy, MD;  Location: [facility];  Service: Urology;  Laterality: N/A;   ETT  2010   normal, ABIs normal   KNEE ARTHROSCOPY Right 2011   [place], ligaments   KNEE ARTHROSCOPY Left 1984   LUMBAR LAMINECTOMY  06/2020   Bilateral L2-3 laminectomy [name])  LUMBAR LAMINECTOMY/DECOMPRESSION MICRODISCECTOMY Left 03/27/2013   Procedure: LUMBAR LAMINECTOMY/DECOMPRESSION MICRODISCECTOMY 2 LEVELS;  Surgeon: Ophelia Charter, MD;  Location: [place] [facility];  Service: Neurosurgery;  Laterality: Left;  LEFT Lumbar Three Four diskectomy with Lumbar Three-Four, Four-Five Laminectomy. L3/4 ahd L4/5 for LSS [name])    LUMBAR LAMINECTOMY/DECOMPRESSION MICRODISCECTOMY Bilateral 07/15/2020   Procedure: LAMINECTOMY AND FORAMINOTOMY BILATERAL LUMBAR TWO-LUMBAR THREE;  Surgeon: Newman Pies, MD;  Location: [place];  Service: Neurosurgery;   Laterality: Bilateral;   POSTERIOR CERVICAL FUSION/FORAMINOTOMY N/A 05/14/2020   Procedure: LAMINECTOMY, POSTERIOR INSTRUMENTATION AND FUSION, CERVICAL ONE- CERVICAL TWO, CERVICAL TWO- CERVICAL THREE;  Surgeon: Newman Pies, MD;  Location: [place];  Service: Neurosurgery;  Laterality: N/A;   POSTERIOR CERVICAL FUSION/FORAMINOTOMY N/A 10/11/2021   Procedure: C6-T3 POSTERIOR FUSION;  Surgeon: Meade Maw, MD;  Location: [facility];  Service: Neurosurgery;  Laterality: N/A;   POSTERIOR CERVICAL LAMINECTOMY N/A 10/11/2021   Procedure: C6-T2 POSTERIOR SPINAL DECOMPRESSION;  Surgeon: Meade Maw, MD;  Location: [facility];  Service: Neurosurgery;  Laterality: N/A;   SHOULDER ARTHROSCOPY Right 2011   spriometry  02/2013   COPD with some restriction thought to be obesity related ([place])   TOTAL KNEE ARTHROPLASTY Right 06/27/2014   Procedure: RIGHT TOTAL KNEE ARTHROPLASTY;  Surgeon: Mcarthur Rossetti, MD;  Location: [facility];  Service: Orthopedics;  Laterality: Right;   US ECHOCARDIOGRAPHY  01/2013   WNL, mild diastolic dysfunction    There were no vitals filed for this visit.   Subjective Assessment - 12/10/21 1034     Subjective  Hip 3/10  pain this date, Getting steroid injection today at 130    Pertinent History Per chart: [name] is a 71 y.o s/p C6-T3 PSF. C6-7 and T1-2 foraminotomies on 10/11/21 with Dr. Izora Ribas. His interoperative course was uncomplicated and he was admitted for therapy evaluation and drain monitoring. PT and OT recommended discharge home with home health. His drain output was monitored for 2 days and removed on POD2 when the output reduced to an acceptable level. He was given prescriptions for pain medication and muscle relaxant. Pt has a history of multiple surgeries  Past Surgery: cervical surgery x 2 (about 20 years ago and in 2021), lumbar surgery about 10 years ago, lumbar surgery about 5 years ago, lumbar surgery (L1-3 decompression) 06/2020 with Dr  [name] .  Per  follow up MD note after surgery:  [name] is doing fair after cervical decompression and fusion. We restarted his gabapentin which is helped significantly with his postoperative right radiating arm pain. He continues to have some left hand weakness and a little bit of weakness in his tricep which were present preoperatively however he feels that he is improving some. He is only doing walking with home therapy. I would like for him to start occupational therapy to work on hand strengthening and we will put in a referral for this. He still requiring pain medication intermittently and would like a refill of his Percocet which was provided to him. We discussed activity escalation and I have advised the patient to lift up to 10 pounds until 6 weeks after surgery, then increase up to 25 pounds until 12 weeks after surgery. After 12 weeks post-op, the patient advised to increase activity as tolerated. he will return to clinic in approximately 4 weeks with Dr. Izora Ribas with cervical xrays prior. He was encouraged to call the office in the interim should he have any questions or concerns. He expressed understanding was in agreement with this plan.  Patient Stated Goals Pt reports he would like his left hand stronger and use the hand as much as possible.    Currently in Pain? Yes    Pain Score 3     Pain Location Hip    Pain Orientation Right    Pain Descriptors / Indicators Aching    Pain Type Acute pain    Pain Onset 1 to 4 weeks ago            Pt reports he shaved today and used left hand for button on shaving can, was not able to do this weeks ago, still some effort.   Therapeutic Exercises: 2# dumbbell weight for left hand flexion/extension, ulnar and radial deviation, forearm supination/pronation, rolling out weight for finger extension while forearm is in supination. Pt performing 10-12 reps for 3 sets. Loop scissors with left hand use, cutting straight lines, precision  decreases with increased repetitions.   Neuromuscular Reeducation: Pt seen for manipulation of [place] discs, turning, flipping, stacking with use of left hand.  Cues for prehension patterns.   Reassessment of goals for progress update.   Response to tx:   Pt demonstrating progress with ROM, strength and use of left hand for functional tasks.  Pt now able to use left hand for shave gel application for shaving.  Improved finger extension on left but still lacks full extension in middle, ring and small fingers. Pt requires occasional rest breaks with hand function tasks.  Pt continues to benefit from skilled OT services to maximize safety and independence in necessary daily tasks.                    OT Education - 12/10/21 1035     Education Details coordination skills, strength, ROM    Person(s) Educated Patient    Methods Explanation;Demonstration;Tactile cues;Verbal cues;Handout    Comprehension Verbalized understanding;Returned demonstration;Verbal cues required;Need further instruction;Tactile cues required                 OT Long Term Goals - 12/10/21 1051       OT LONG TERM GOAL #1   Title Pt will demonstrate home exercise program with modified independence    Baseline Eval:  no current program.  10th visit: continue to modify HEP    Time 6    Period Weeks    Status On-going    Target Date 12/15/21      OT LONG TERM GOAL #2   Title Pt to improve left grip strength by 10# to be able to open jars and containers with modified independence    Baseline eval:  decreased grip on left  compared to right    Time 12    Period Weeks    Status On-going    Target Date 01/26/22      OT LONG TERM GOAL #3   Title Pt will improve left pinch strength by 5# to be able to cut food with modified independence.    Baseline Eval: requires assist to cut food    Time 12    Period Weeks    Status On-going    Target Date 01/26/22      OT LONG TERM GOAL #4   Title Pt  will complete bathing and dressing with modified independence.    Baseline Eval:  requires assist to bathe feet, back, increased time to complete bathing and dressing    Time 12    Period Weeks    Status On-going    Target  Date 01/26/22      OT LONG TERM GOAL #5   Title Pt will be able to complete cooking/grilling tasks with modified independence.    Baseline Eval: unable to complete cooking or grilling    Time 12    Period Weeks    Status On-going    Target Date 01/26/22      OT LONG TERM GOAL #6   Title Pt will improve FOTO score to 62 or better to demonstrate a clinically relevant change to impact and facilitate greater independence in daily ADL and IADL tasks.    Baseline Eval score 49    Time 12    Period Weeks    Status On-going    Target Date 01/26/22                   Plan - 12/10/21 1036     Clinical Impression Statement Pt demonstrating progress with ROM, strength and use of left hand for functional tasks.  Pt now able to use left hand for shave gel application for shaving.  Improved finger extension on left but still lacks full extension in middle, ring and small fingers. Pt requires occasional rest breaks with hand function tasks.  Pt continues to benefit from skilled OT services to maximize safety and independence in necessary daily tasks.    OT Occupational Profile and History Problem Focused Assessment - Including review of records relating to presenting problem    Occupational performance deficits (Please refer to evaluation for details): ADL's;IADL's;Leisure    Body Structure / Function / Physical Skills ADL;GMC;UE functional use;Sensation;Flexibility;IADL;Pain;FMC;Dexterity;Strength;ROM    Psychosocial Skills Environmental  Adaptations;Habits;Routines and Behaviors    Rehab Potential Good    Clinical Decision Making Several treatment options, min-mod task modification necessary    Comorbidities Affecting Occupational Performance: Presence of comorbidities  impacting occupational performance    Comorbidities impacting occupational performance description: extensive surgical history, currently with [place] J brace, pain    Modification or Assistance to Complete Evaluation  No modification of tasks or assist necessary to complete eval    OT Frequency 2x / week    OT Duration 12 weeks    OT Treatment/Interventions Self-care/ADL training;Cryotherapy;Paraffin;Therapeutic exercise;DME and/or AE instruction;Electrical Stimulation;Neuromuscular education;Manual Therapy;Splinting;Moist Heat;Contrast Bath;Passive range of motion;Therapeutic activities;Patient/family education;Coping strategies training    Consulted and Agree with Plan of Care Patient             Patient will benefit from skilled therapeutic intervention in order to improve the following deficits and impairments:   Body Structure / Function / Physical Skills: ADL, GMC, UE functional use, Sensation, Flexibility, IADL, Pain, FMC, Dexterity, Strength, ROM   Psychosocial Skills: Environmental  Adaptations, Habits, Routines and Behaviors   Visit Diagnosis: Muscle weakness (generalized)  Other lack of coordination    Problem List Patient Active Problem List   Diagnosis Date Noted   S/P cervical spinal fusion 10/11/2021   Aortic valve calcification 07/19/2021   Right leg pain 01/05/2021   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 [facility]) 05/14/2020   Spinal stenosis of lumbar region with neurogenic claudication 04/21/2020   Incomplete emptying of bladder 04/08/2020   Pre-op evaluation 03/27/2020   Persistent atrial fibrillation ([place]) 03/27/2020   Macrocytic anemia 06/20/2019   Lumbar radiculopathy, chronic 04/24/2019   Kidney cyst, acquired 12/12/2017   Personal history of tobacco use, presenting hazards to health 09/27/2016   Aortic atherosclerosis ([place]) 09/15/2016   CAD (coronary artery disease) 09/15/2016   Tinea pedis of  both feet 03/07/2016    Tobacco abuse 09/30/2015   Advanced care planning/counseling discussion 09/03/2014   Primary osteoarthritis of right knee 06/27/2014   Status post total right knee replacement 06/27/2014   Habitual alcohol use 12/08/2013   Initial Medicare annual wellness visit 08/25/2011   Vitamin D deficiency 08/26/2010   Obesity, Class I, BMI 30-34.9 08/26/2010   BPH (benign prostatic hyperplasia) 08/26/2010   Onychomycosis 02/08/2010   PREMATURE VENTRICULAR CONTRACTIONS 11/17/2009   PVD 11/06/2008   Snoring 11/06/2008   HLD (hyperlipidemia) 07/31/2007   Essential hypertension 07/31/2007   Allergic rhinitis 07/31/2007   COPD (chronic obstructive pulmonary disease) ([place]) 07/31/2007   DEGENERATIVE [place] DISEASE, CERVICAL SPINE 07/31/2007   Prediabetes 07/31/2007   History of hepatitis C 07/31/2007   Shatoya Roets T Ledger Heindl, OTR/L, CLT  Alexius Ellington, OT 12/10/2021, 10:55 AM  [place] [address] [place], [place], [address] Phone: [phone]   Fax:  [phone]  Name: MAEJOR ERVEN MRN: [id number] Date of Birth: May 27, 1951

## 2021-12-14 ENCOUNTER — Encounter: Payer: Self-pay | Admitting: Occupational Therapy

## 2021-12-14 ENCOUNTER — Ambulatory Visit: Payer: PPO | Admitting: Occupational Therapy

## 2021-12-14 DIAGNOSIS — R278 Other lack of coordination: Secondary | ICD-10-CM

## 2021-12-14 DIAGNOSIS — M6281 Muscle weakness (generalized): Secondary | ICD-10-CM

## 2021-12-15 NOTE — Therapy (Addendum)
[place] MAIN [facility] SERVICES [address] [place], [place], [address] Phone: [phone]   Fax:  [phone]  Occupational Therapy Treatment/Discharge  Patient Details  Name: [name] MRN: [id number] Date of Birth: 05/26/1951 Referring Provider (OT): Loleta Dicker, [place]   Encounter Date: 12/14/2021   OT End of Session - 12/15/21 2010     Visit Number 11    Number of Visits 24    Date for OT Re-Evaluation 01/26/22    OT Start Time 1014    OT Stop Time 1100    OT Time Calculation (min) 46 min    Activity Tolerance Patient tolerated treatment well    Behavior During Therapy [facility] for tasks assessed/performed             Past Medical History:  Diagnosis Date   Allergic rhinitis 07/1998   Aortic atherosclerosis ([place]) 09/17/2016   Arthritis    BACK AND RIGHT KNEE   CAD (coronary artery disease) 07/01/2015   a.) LHC 07/01/2015: normal LV function; 30% p-mLAD, 20% p-mRCA; intervention deferred opting for medical mgmt.   COPD (chronic obstructive pulmonary disease) ([place]) 07/1998   centrilobular emphysema (09/2016) w ongoing tobacco use, spirometry (02/2013)   DDD (degenerative disc disease), cervical    DOE (dyspnea on exertion)    Erectile dysfunction    a.) on PDE5i (sildenafil)   ETOH abuse    a.) consumes 4-5 beers on a daily basis   History of exposure to asbestos    HTN (hypertension) 05/1998   Hyperlipidemia 06/1996   Hypertensive retinopathy    Long term current use of anticoagulant    a.) apixaban   Lumbar spinal stenosis 03/2013   a.) s/p laminectomy   Macrocytic anemia    Migraines    Peripheral vascular disease ([place])    Persistent atrial fibrillation ([facility])    a.) CHA2DS2-VASc = 3 (age, HTN, aortic plaque). 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   Smoker     Past Surgical History:  Procedure Laterality Date   ANTERIOR CERVICAL DECOMP/DISCECTOMY FUSION  2774   J2-8   APPLICATION OF INTRAOPERATIVE CT SCAN N/A 10/11/2021   Procedure: APPLICATION OF INTRAOPERATIVE CT SCAN;  Surgeon: Meade Maw, MD;  Location: [facility] ORS;  Service: Neurosurgery;  Laterality: N/A;   BACK SURGERY  2007   CARDIAC CATHETERIZATION N/A 07/01/2015   Procedure: Left Heart Cath and Coronary Angiography;  Surgeon: Wellington Hampshire, MD;  Location: [place] CV LAB;  Service: Cardiovascular;  Laterality: N/A;   CARDIOVERSION N/A 05/06/2020   Procedure: CARDIOVERSION;  Surgeon: Kate Sable, MD;  Location: [facility] ORS;  Service: Cardiovascular;  Laterality: N/A;   COLONOSCOPY  09/2010   HPx2, diverticula, hemorrhoids, rpt 10 yrs [place])   COLONOSCOPY  01/07/2021   SSA ([place])   CYSTOSCOPY WITH INSERTION OF UROLIFT N/A 11/02/2020   Procedure: CYSTOSCOPY WITH INSERTION OF UROLIFT;  Surgeon: Hollice Espy, MD;  Location: [facility] ORS;  Service: Urology;  Laterality: N/A;   ETT  2010   normal, ABIs normal   KNEE ARTHROSCOPY Right 2011   [place], ligaments   KNEE ARTHROSCOPY Left 1984   LUMBAR LAMINECTOMY  06/2020   Bilateral L2-3 laminectomy [name])   LUMBAR LAMINECTOMY/DECOMPRESSION MICRODISCECTOMY Left 03/27/2013   Procedure:  LUMBAR LAMINECTOMY/DECOMPRESSION MICRODISCECTOMY 2 LEVELS;  Surgeon: Ophelia Charter, MD;  Location: [place] NEURO ORS;  Service: Neurosurgery;  Laterality: Left;  LEFT Lumbar Three Four diskectomy with Lumbar Three-Four, Four-Five Laminectomy. L3/4 ahd L4/5 for LSS [name])    LUMBAR LAMINECTOMY/DECOMPRESSION MICRODISCECTOMY Bilateral 07/15/2020   Procedure: LAMINECTOMY AND FORAMINOTOMY BILATERAL LUMBAR TWO-LUMBAR THREE;  Surgeon: Newman Pies, MD;  Location: [place];  Service: Neurosurgery;  Laterality: Bilateral;   POSTERIOR CERVICAL  FUSION/FORAMINOTOMY N/A 05/14/2020   Procedure: LAMINECTOMY, POSTERIOR INSTRUMENTATION AND FUSION, CERVICAL ONE- CERVICAL TWO, CERVICAL TWO- CERVICAL THREE;  Surgeon: Newman Pies, MD;  Location: [place];  Service: Neurosurgery;  Laterality: N/A;   POSTERIOR CERVICAL FUSION/FORAMINOTOMY N/A 10/11/2021   Procedure: C6-T3 POSTERIOR FUSION;  Surgeon: Meade Maw, MD;  Location: [facility] ORS;  Service: Neurosurgery;  Laterality: N/A;   POSTERIOR CERVICAL LAMINECTOMY N/A 10/11/2021   Procedure: C6-T2 POSTERIOR SPINAL DECOMPRESSION;  Surgeon: Meade Maw, MD;  Location: [facility] ORS;  Service: Neurosurgery;  Laterality: N/A;   SHOULDER ARTHROSCOPY Right 2011   spriometry  02/2013   COPD with some restriction thought to be obesity related ([place])   TOTAL KNEE ARTHROPLASTY Right 06/27/2014   Procedure: RIGHT TOTAL KNEE ARTHROPLASTY;  Surgeon: Mcarthur Rossetti, MD;  Location: [facility] ORS;  Service: Orthopedics;  Laterality: Right;   US ECHOCARDIOGRAPHY  01/2013   WNL, mild diastolic dysfunction    There were no vitals filed for this visit.   Subjective Assessment - 12/15/21 2009     Subjective  Pt reports he is planning for today to be his last day of therapy, states, "I know what exercises I need to do, you taught me"    Pertinent History Per chart: [name] is a 71 y.o s/p C6-T3 PSF. C6-7 and T1-2 foraminotomies on 10/11/21 with Dr. Izora Ribas. His interoperative course was uncomplicated and he was admitted for therapy evaluation and drain monitoring. PT and OT recommended discharge home with home health. His drain output was monitored for 2 days and removed on POD2 when the output reduced to an acceptable level. He was given prescriptions for pain medication and muscle relaxant. Pt has a history of multiple surgeries  Past Surgery: cervical surgery x 2 (about 20 years ago and in 2021), lumbar surgery about 10 years ago, lumbar surgery about 5 years ago, lumbar surgery (L1-3 decompression)  06/2020 with Dr [name] .  Per  follow up MD note after surgery:  [name] is doing fair after cervical decompression and fusion. We restarted his gabapentin which is helped significantly with his postoperative right radiating arm pain. He continues to have some left hand weakness and a little bit of weakness in his tricep which were present preoperatively however he feels that he is improving some. He is only doing walking with home therapy. I would like for him to start occupational therapy to work on hand strengthening and we will put in a referral for this. He still requiring pain medication intermittently and would like a refill of his Percocet which was provided to him. We discussed activity escalation and I have advised the patient to lift up to 10 pounds until 6 weeks after surgery, then increase up to 25 pounds until 12 weeks after surgery. After 12 weeks post-op, the patient advised to increase activity as tolerated. he will return to clinic in approximately 4 weeks with Dr. Izora Ribas with cervical xrays prior. He was encouraged to call the office in the interim should he have any questions or concerns. He expressed understanding  was in agreement with this plan.    Patient Stated Goals Pt reports he would like his left hand stronger and use the hand as much as possible.    Pain Onset 1 to 4 weeks ago              Pt seen for reassessment of hand skills as noted in flowchart above.  Also administered FOTO scores improved from 48 to 71.    Pt seen for final instruction on home program for strengthening with use of theraputty with [place] handouts.  Able to demonstrate understanding.  Neuro: Pt instructed on fine motor coordination exercises with written handout with review and instruction from therapist.    Response to tx: Pt made excellent progress in left UE, improved ROM overall in MCP, PIP and DIP joints of all digits.  Pt still lacks full extension but realizes with the  nerve regeneration and time this will likely continue to improve.  Pt feels he is aware of exercises to continue with at home and independent with his exercises.  Foto scores improved significantly and exceeded original goal.  Will plan to discharge pt at this time with goals partially met and patient to continue with a daily home exercise program.  Please reconsult if further needs arise.          Rationale for Evaluation and Treatment Rehabilitation           OT Education - 12/15/21 2010     Education Details coordination skills, strength, ROM    Person(s) Educated Patient    Methods Explanation;Demonstration;Tactile cues;Verbal cues;Handout    Comprehension Verbalized understanding;Returned demonstration;Verbal cues required;Need further instruction;Tactile cues required                 OT Long Term Goals - 12/14/21 1033       OT LONG TERM GOAL #1   Title Pt will demonstrate home exercise program with modified independence    Baseline Eval:  no current program.  10th visit: continue to modify HEP    Time 6    Period Weeks    Status Achieved    Target Date 12/15/21      OT LONG TERM GOAL #2   Title Pt to improve left grip strength by 10# to be able to open jars and containers with modified independence    Baseline eval:  decreased grip on left  compared to right    Time 12    Period Weeks    Status Achieved    Target Date 01/26/22      OT LONG TERM GOAL #3   Title Pt will improve left pinch strength by 5# to be able to cut food with modified independence.    Baseline Eval: requires assist to cut food    Time 12    Period Weeks    Status Partially Met    Target Date 01/26/22      OT LONG TERM GOAL #4   Title Pt will complete bathing and dressing with modified independence.    Baseline Eval:  requires assist to bathe feet, back, increased time to complete bathing and dressing    Time 12    Period Weeks    Status Achieved    Target Date 01/26/22       OT LONG TERM GOAL #5   Title Pt will be able to complete cooking/grilling tasks with modified independence.    Baseline Eval: unable to complete cooking or grilling    Time  12    Period Weeks    Status Achieved    Target Date 01/26/22      OT LONG TERM GOAL #6   Title Pt will improve FOTO score to 62 or better to demonstrate a clinically relevant change to impact and facilitate greater independence in daily ADL and IADL tasks.    Baseline Eval score 49, 11th visit: 71    Time 12    Period Weeks    Status Achieved    Target Date 01/26/22                   Plan - 12/15/21 2011     Clinical Impression Statement Pt made excellent progress in left UE, improved ROM overall in MCP, PIP and DIP joints of all digits.  Pt still lacks full extension but realizes with the nerve regeneration and time this will likely continue to improve.  Pt feels he is aware of exercises to continue with at home and independent with his exercises.  Foto scores improved significantly and exceeded original goal.  Will plan to discharge pt at this time with goals partially met and patient to continue with a daily home exercise program.  Please reconsult if further needs arise.      OT Occupational Profile and History Problem Focused Assessment - Including review of records relating to presenting problem    Occupational performance deficits (Please refer to evaluation for details): ADL's;IADL's;Leisure    Body Structure / Function / Physical Skills ADL;GMC;UE functional use;Sensation;Flexibility;IADL;Pain;FMC;Dexterity;Strength;ROM    Psychosocial Skills Environmental  Adaptations;Habits;Routines and Behaviors    Rehab Potential Good    Clinical Decision Making Several treatment options, min-mod task modification necessary    Comorbidities Affecting Occupational Performance: Presence of comorbidities impacting occupational performance    Comorbidities impacting occupational performance description: extensive  surgical history, currently with [place] J brace, pain    Modification or Assistance to Complete Evaluation  No modification of tasks or assist necessary to complete eval    OT Frequency 2x / week    OT Duration 12 weeks    OT Treatment/Interventions Self-care/ADL training;Cryotherapy;Paraffin;Therapeutic exercise;DME and/or AE instruction;Electrical Stimulation;Neuromuscular education;Manual Therapy;Splinting;Moist Heat;Contrast Bath;Passive range of motion;Therapeutic activities;Patient/family education;Coping strategies training    Consulted and Agree with Plan of Care Patient             Patient will benefit from skilled therapeutic intervention in order to improve the following deficits and impairments:   Body Structure / Function / Physical Skills: ADL, GMC, UE functional use, Sensation, Flexibility, IADL, Pain, FMC, Dexterity, Strength, ROM   Psychosocial Skills: Environmental  Adaptations, Habits, Routines and Behaviors   Visit Diagnosis: Muscle weakness (generalized)  Other lack of coordination    Problem List Patient Active Problem List   Diagnosis Date Noted   S/P cervical spinal fusion 10/11/2021   Aortic valve calcification 07/19/2021   Right leg pain 01/05/2021   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 [facility]) 05/14/2020   Spinal stenosis of lumbar region with neurogenic claudication 04/21/2020   Incomplete emptying of bladder 04/08/2020   Pre-op evaluation 03/27/2020   Persistent atrial fibrillation ([place]) 03/27/2020   Macrocytic anemia 06/20/2019   Lumbar radiculopathy, chronic 04/24/2019   Kidney cyst, acquired 12/12/2017   Personal history of tobacco use, presenting hazards to health 09/27/2016   Aortic atherosclerosis ([place]) 09/15/2016   CAD (coronary artery disease) 09/15/2016   Tinea pedis of both feet 03/07/2016   Tobacco abuse 09/30/2015   Advanced care  planning/counseling discussion 09/03/2014   Primary osteoarthritis of  right knee 06/27/2014   Status post total right knee replacement 06/27/2014   Habitual alcohol use 12/08/2013   Initial Medicare annual wellness visit 08/25/2011   Vitamin D deficiency 08/26/2010   Obesity, Class I, BMI 30-34.9 08/26/2010   BPH (benign prostatic hyperplasia) 08/26/2010   Onychomycosis 02/08/2010   PREMATURE VENTRICULAR CONTRACTIONS 11/17/2009   PVD 11/06/2008   Snoring 11/06/2008   HLD (hyperlipidemia) 07/31/2007   Essential hypertension 07/31/2007   Allergic rhinitis 07/31/2007   COPD (chronic obstructive pulmonary disease) ([place]) 07/31/2007   DEGENERATIVE [place] DISEASE, CERVICAL SPINE 07/31/2007   Prediabetes 07/31/2007   History of hepatitis C 07/31/2007   Avabella Wailes T Sotiria Keast, OTR/L, CLT  Marializ Ferrebee, OT 12/15/2021, 8:12 PM  [place] [address] [place], [place], [address] Phone: [phone]   Fax:  [phone]  Name: MAINOR HELLMANN MRN: [id number] Date of Birth: [birth year]

## 2021-12-16 ENCOUNTER — Ambulatory Visit: Payer: PPO | Admitting: Occupational Therapy

## 2021-12-20 ENCOUNTER — Encounter: Payer: PPO | Admitting: Occupational Therapy

## 2021-12-21 ENCOUNTER — Encounter: Payer: PPO | Admitting: Occupational Therapy

## 2021-12-22 ENCOUNTER — Encounter: Payer: PPO | Admitting: Occupational Therapy

## 2021-12-23 ENCOUNTER — Encounter: Payer: PPO | Admitting: Occupational Therapy

## 2021-12-28 ENCOUNTER — Encounter: Payer: PPO | Admitting: Occupational Therapy

## 2021-12-30 ENCOUNTER — Telehealth: Payer: Self-pay | Admitting: Family Medicine

## 2021-12-30 ENCOUNTER — Encounter: Payer: PPO | Admitting: Occupational Therapy

## 2021-12-30 NOTE — Telephone Encounter (Signed)
Spoke to patient to AWV.   Patient declined

## 2022-01-02 ENCOUNTER — Other Ambulatory Visit: Payer: Self-pay | Admitting: Family Medicine

## 2022-01-02 DIAGNOSIS — I4819 Other persistent atrial fibrillation: Secondary | ICD-10-CM

## 2022-01-02 DIAGNOSIS — N400 Enlarged prostate without lower urinary tract symptoms: Secondary | ICD-10-CM

## 2022-01-02 DIAGNOSIS — E7800 Pure hypercholesterolemia, unspecified: Secondary | ICD-10-CM

## 2022-01-02 DIAGNOSIS — R7303 Prediabetes: Secondary | ICD-10-CM

## 2022-01-02 DIAGNOSIS — F1090 Alcohol use, unspecified, uncomplicated: Secondary | ICD-10-CM

## 2022-01-02 DIAGNOSIS — E559 Vitamin D deficiency, unspecified: Secondary | ICD-10-CM

## 2022-01-05 ENCOUNTER — Other Ambulatory Visit (INDEPENDENT_AMBULATORY_CARE_PROVIDER_SITE_OTHER): Payer: PPO

## 2022-01-05 DIAGNOSIS — F1090 Alcohol use, unspecified, uncomplicated: Secondary | ICD-10-CM | POA: Diagnosis not present

## 2022-01-05 DIAGNOSIS — E559 Vitamin D deficiency, unspecified: Secondary | ICD-10-CM | POA: Diagnosis not present

## 2022-01-05 DIAGNOSIS — N400 Enlarged prostate without lower urinary tract symptoms: Secondary | ICD-10-CM | POA: Diagnosis not present

## 2022-01-05 DIAGNOSIS — R7303 Prediabetes: Secondary | ICD-10-CM

## 2022-01-05 DIAGNOSIS — I4819 Other persistent atrial fibrillation: Secondary | ICD-10-CM

## 2022-01-05 DIAGNOSIS — E7800 Pure hypercholesterolemia, unspecified: Secondary | ICD-10-CM | POA: Diagnosis not present

## 2022-01-05 LAB — CBC WITH DIFFERENTIAL/PLATELET
Basophils Absolute: 0 10*3/uL (ref 0.0–0.1)
Basophils Relative: 0.4 % (ref 0.0–3.0)
Eosinophils Absolute: 0.1 10*3/uL (ref 0.0–0.7)
Eosinophils Relative: 2.8 % (ref 0.0–5.0)
HCT: 36.8 % — ABNORMAL LOW (ref 39.0–52.0)
Hemoglobin: 12.6 g/dL — ABNORMAL LOW (ref 13.0–17.0)
Lymphocytes Relative: 47 % — ABNORMAL HIGH (ref 12.0–46.0)
Lymphs Abs: 2.3 10*3/uL (ref 0.7–4.0)
MCHC: 34.3 g/dL (ref 30.0–36.0)
MCV: 99.8 fl (ref 78.0–100.0)
Monocytes Absolute: 0.5 10*3/uL (ref 0.1–1.0)
Monocytes Relative: 10.3 % (ref 3.0–12.0)
Neutro Abs: 1.9 10*3/uL (ref 1.4–7.7)
Neutrophils Relative %: 39.5 % — ABNORMAL LOW (ref 43.0–77.0)
Platelets: 231 10*3/uL (ref 150.0–400.0)
RBC: 3.69 Mil/uL — ABNORMAL LOW (ref 4.22–5.81)
RDW: 13.7 % (ref 11.5–15.5)
WBC: 4.8 10*3/uL (ref 4.0–10.5)

## 2022-01-05 LAB — LIPID PANEL
Cholesterol: 174 mg/dL (ref 0–200)
HDL: 81.8 mg/dL (ref >39.00)
LDL Cholesterol: 82 mg/dL (ref 0–99)
NonHDL: 92.13
Total CHOL/HDL Ratio: 2
Triglycerides: 51 mg/dL (ref 0.0–149.0)
VLDL: 10.2 mg/dL (ref 0.0–40.0)

## 2022-01-05 LAB — COMPREHENSIVE METABOLIC PANEL
ALT: 22 U/L (ref 0–53)
AST: 17 U/L (ref 0–37)
Albumin: 4.3 g/dL (ref 3.5–5.2)
Alkaline Phosphatase: 71 U/L (ref 39–117)
BUN: 12 mg/dL (ref 6–23)
CO2: 29 mEq/L (ref 19–32)
Calcium: 9.4 mg/dL (ref 8.4–10.5)
Chloride: 98 mEq/L (ref 96–112)
Creatinine, Ser: 0.81 mg/dL (ref 0.40–1.50)
GFR: 89.32 mL/min (ref >60.00)
Glucose, Bld: 115 mg/dL — ABNORMAL HIGH (ref 70–99)
Potassium: 4.7 mEq/L (ref 3.5–5.1)
Sodium: 132 mEq/L — ABNORMAL LOW (ref 135–145)
Total Bilirubin: 0.5 mg/dL (ref 0.2–1.2)
Total Protein: 6.5 g/dL (ref 6.0–8.3)

## 2022-01-05 LAB — VITAMIN B12: Vitamin B-12: 321 pg/mL (ref 211–911)

## 2022-01-05 LAB — VITAMIN D 25 HYDROXY (VIT D DEFICIENCY, FRACTURES): VITD: 43.22 ng/mL (ref 30.00–100.00)

## 2022-01-05 LAB — PSA: PSA: 2.67 ng/mL (ref 0.10–4.00)

## 2022-01-05 LAB — HEMOGLOBIN A1C: Hgb A1c MFr Bld: 5.7 % (ref 4.6–6.5)

## 2022-01-11 LAB — VITAMIN B1: Vitamin B1 (Thiamine): 13 nmol/L (ref 8–30)

## 2022-01-12 ENCOUNTER — Ambulatory Visit (INDEPENDENT_AMBULATORY_CARE_PROVIDER_SITE_OTHER): Payer: PPO | Admitting: Family Medicine

## 2022-01-12 ENCOUNTER — Encounter: Payer: Self-pay | Admitting: Family Medicine

## 2022-01-12 VITALS — BP 130/80 | HR 80 | Temp 97.6°F | Ht 64.5 in | Wt 185.4 lb

## 2022-01-12 DIAGNOSIS — E7800 Pure hypercholesterolemia, unspecified: Secondary | ICD-10-CM

## 2022-01-12 DIAGNOSIS — N281 Cyst of kidney, acquired: Secondary | ICD-10-CM

## 2022-01-12 DIAGNOSIS — N400 Enlarged prostate without lower urinary tract symptoms: Secondary | ICD-10-CM

## 2022-01-12 DIAGNOSIS — Z981 Arthrodesis status: Secondary | ICD-10-CM

## 2022-01-12 DIAGNOSIS — Z0000 Encounter for general adult medical examination without abnormal findings: Secondary | ICD-10-CM | POA: Insufficient documentation

## 2022-01-12 DIAGNOSIS — E559 Vitamin D deficiency, unspecified: Secondary | ICD-10-CM

## 2022-01-12 DIAGNOSIS — R7303 Prediabetes: Secondary | ICD-10-CM

## 2022-01-12 DIAGNOSIS — E669 Obesity, unspecified: Secondary | ICD-10-CM

## 2022-01-12 DIAGNOSIS — I700 Atherosclerosis of aorta: Secondary | ICD-10-CM

## 2022-01-12 DIAGNOSIS — Z720 Tobacco use: Secondary | ICD-10-CM

## 2022-01-12 DIAGNOSIS — F1090 Alcohol use, unspecified, uncomplicated: Secondary | ICD-10-CM

## 2022-01-12 DIAGNOSIS — I10 Essential (primary) hypertension: Secondary | ICD-10-CM

## 2022-01-12 DIAGNOSIS — Z7189 Other specified counseling: Secondary | ICD-10-CM

## 2022-01-12 DIAGNOSIS — G992 Myelopathy in diseases classified elsewhere: Secondary | ICD-10-CM

## 2022-01-12 DIAGNOSIS — D539 Nutritional anemia, unspecified: Secondary | ICD-10-CM

## 2022-01-12 DIAGNOSIS — M5030 Other cervical disc degeneration, unspecified cervical region: Secondary | ICD-10-CM

## 2022-01-12 DIAGNOSIS — I4819 Other persistent atrial fibrillation: Secondary | ICD-10-CM

## 2022-01-12 DIAGNOSIS — J449 Chronic obstructive pulmonary disease, unspecified: Secondary | ICD-10-CM

## 2022-01-12 DIAGNOSIS — M48062 Spinal stenosis, lumbar region with neurogenic claudication: Secondary | ICD-10-CM

## 2022-01-12 MED ORDER — ATORVASTATIN CALCIUM 10 MG PO TABS: ORAL_TABLET | ORAL | 3 refills | Status: DC

## 2022-01-12 MED ORDER — LISINOPRIL 20 MG PO TABS: 20.0000 mg | ORAL_TABLET | Freq: Every day | ORAL | 3 refills | Status: DC

## 2022-01-12 MED ORDER — PROAIR RESPICLICK 108 (90 BASE) MCG/ACT IN AEPB: 2.0000 | INHALATION_SPRAY | Freq: Four times a day (QID) | RESPIRATORY_TRACT | 3 refills | Status: AC | PRN

## 2022-01-12 NOTE — Assessment & Plan Note (Signed)
Preventative protocols reviewed and updated unless pt declined. Discussed healthy diet and lifestyle.  

## 2022-01-12 NOTE — Assessment & Plan Note (Signed)

## 2022-01-12 NOTE — Progress Notes (Unsigned)
Patient ID: [name], male    DOB: Sep 19, 1950, 71 y.o.   MRN: [id number]  This visit was conducted in person.  BP 130/80   Pulse 80   Temp 97.6 F (36.4 C) (Temporal)   Ht 5' 4.5" (1.638 m)   Wt 185 lb 6 oz (84.1 kg)   SpO2 97%   BMI 31.33 kg/m    CC: AMW/CPE Subjective:   HPI: [name] is a 71 y.o. male presenting on 01/12/2022 for Medicare Wellness   Did not see health advisor this year.   Hearing Screening   '500Hz'$  '1000Hz'$  '2000Hz'$  '4000Hz'$   Right ear '20 20 20 '$ 0  Left ear '20 20 20 '$ 0  Vision Screening - Comments:: Last eye exam, 08/2021.   Flowsheet Row Clinical Support from 12/29/2020 in [facility]  PHQ-2 Total Score 0          01/12/2022    9:45 AM 12/29/2020   10:28 AM 10/02/2019   11:15 AM 09/12/2018   12:18 PM 09/11/2017    9:36 AM  Fall Risk   Falls in the past year? 0 0 0 0 No  Number falls in past yr:  0 0    Injury with Fall?  0 0    Risk for fall due to :  Medication side effect Medication side effect    Follow up  Falls evaluation completed;Falls prevention discussed Falls prevention discussed;Falls evaluation completed     Persistent afib, failed DCCV 04/2020, continues eliquis and metoprolol bid.    Multiple recent back procedures including posterior cervical disc fusion C1-2-3 04/2020 as well as bilateral L2/3 laminectomy/foraminotomy 06/2020 [name]).  Most recently C6-T3 posterior spinal fusion ([place]) 09/2021.  Notes persistent L finger extension weakness that is hopeful will improve.   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   [facility]   Lung cancer screening CT showed aort ATH, CAC, emphysema, and likely bilateral kidney cysts.   COPD - using albuterol proair twice weekly.     Preventative: Colonoscopy 09/23/2010 - small HPs, diverticula, hemorrhoids. Rpt 10 yrs Benson Norway). iFOB normal 2020.  Colonoscopy 1-2 yrs ago Benson Norway) - will request records.  Prostate - normal DRE/PSA in past.  h/o BPH, s/p UroLift 10/2020 [name]) with significant improvement in obstructive symptoms.  Lung cancer screening - continues yearly screening CT since 2018, latest 07/2021.  AAA screen - poor study, no AAA noted (09/2016).  Flu shot yearly  [place] 08/2019, 09/2019, Moderna booster 10/2020  Pneumovax - 2015, 2020, [id number] 2018, 2019 Tdap 2014 zostavax - 09/2013 shingrix - discussed - interested  Advanced directives: has at home. HCPOA is daughter, [name]. He and wife decline bringing copy of living will for our records .  Seat belt use discussed. Sunscreen use discussed. No changing moles on skin. Smoking 3-4 cig/wk. 1 pack lasts a month Alcohol - 4-5 beers/day  Dentist q6 mo  Eye exam q2 yrs  Bowel - no diarrhea/constipation  Bladder - no incontinence    Lives with wife. Has 1 son, 2 daughters.   Occupation: Psychologist, clinical. Activity: no regular exercise.  Diet: no water, fruits/vegetables daily      Relevant past medical, surgical, family and social history reviewed and updated as indicated. Interim medical history since our last visit reviewed. Allergies and  medications reviewed and updated. Outpatient Medications Prior to Visit  Medication Sig Dispense Refill   apixaban (ELIQUIS) 5 MG TABS tablet Take 5 mg by mouth 2 (two) times daily.     Cholecalciferol 25 MCG (1000 UT) tablet Take 1,000 Units by mouth daily.     fluticasone-salmeterol (WIXELA INHUB) 250-50 MCG/ACT AEPB Inhale 1 puff into the lungs daily.     loratadine (CLARITIN) 10 MG tablet Take 10 mg by mouth as needed.     metoprolol tartrate (LOPRESSOR) 50 MG tablet Take 1 tablet (50 mg total) by mouth 2 (two) times daily. 60 tablet 3   OVER THE COUNTER MEDICATION Take 2 tablets by mouth daily. sulfurzyme      OVER THE COUNTER MEDICATION Take 1,500 mg by mouth daily. BLM Capsules - [facility] Essential Oils 750 mg each     sildenafil (VIAGRA) 50 MG tablet Take 50 mg by mouth daily as needed for erectile dysfunction. Taking 1/2 tablet PRN (from [name])     Albuterol Sulfate (PROAIR RESPICLICK) 573 (90 Base) MCG/ACT AEPB Inhale 2 puffs into the lungs every 6 (six) hours as needed (cough, shortness of breath). 1 each 3   atorvastatin (LIPITOR) 10 MG tablet TAKE 1 TABLET(10 MG) BY MOUTH DAILY 90 tablet 2   Fluticasone-Salmeterol (ADVAIR) 100-50 MCG/DOSE AEPB Inhale 1 puff into the lungs daily as needed (Shortness of breath). WIXELA BRAND     lisinopril (ZESTRIL) 20 MG tablet Take 1 tablet (20 mg total) by mouth daily. 90 tablet 3   gabapentin (NEURONTIN) 300 MG capsule Take by mouth.     methocarbamol (ROBAXIN) 500 MG tablet Take 1 tablet (500 mg total) by mouth every 6 (six) hours as needed for muscle spasms. 120 tablet 0   senna (SENOKOT) 8.6 MG TABS tablet Take 1 tablet (8.6 mg total) by mouth 2 (two) times daily as needed for mild constipation. 60 tablet 0   tiotropium (SPIRIVA) 18 MCG inhalation capsule Place 18 mcg into inhaler and inhale as needed.     No facility-administered medications prior to visit.     Per HPI unless specifically indicated in ROS section below Review of Systems  Constitutional:  Negative for activity change, appetite change, chills, fatigue, fever and unexpected weight change.  HENT:  Negative for hearing loss.   Eyes:  Negative for visual disturbance.  Respiratory:  Positive for cough (occ) and shortness of breath (occ). Negative for chest tightness and wheezing.   Cardiovascular:  Negative for chest pain, palpitations and leg swelling.  Gastrointestinal:  Negative for abdominal distention, abdominal pain, blood in stool, constipation, diarrhea, nausea and vomiting.       Gassiness since Urolift - rec Gas-x  Genitourinary:  Negative for difficulty urinating and  hematuria.  Musculoskeletal:  Negative for arthralgias, myalgias and neck pain.  Skin:  Negative for rash.  Neurological:  Negative for dizziness, seizures, syncope and headaches.  Hematological:  Negative for adenopathy. Bruises/bleeds easily (eliquis).  Psychiatric/Behavioral:  Negative for dysphoric mood. The patient is not nervous/anxious.     Objective:  BP 130/80   Pulse 80   Temp 97.6 F (36.4 C) (Temporal)   Ht 5' 4.5" (1.638 m)   Wt 185 lb 6 oz (84.1 kg)   SpO2 97%   BMI 31.33 kg/m   Wt Readings from Last 3 Encounters:  01/12/22 185 lb 6 oz (84.1 kg)  11/25/21 191 lb (86.6 kg)  10/11/21 195 lb 5.2 oz (88.6 kg)      Physical Exam  Vitals and nursing note reviewed.  Constitutional:      General: He is not in acute distress.    Appearance: Normal appearance. He is well-developed. He is not ill-appearing.  HENT:     Head: Normocephalic and atraumatic.     Right Ear: Hearing, tympanic membrane, ear canal and external ear normal.     Left Ear: Hearing, tympanic membrane, ear canal and external ear normal.  Eyes:     General: No scleral icterus.    Extraocular Movements: Extraocular movements intact.     Conjunctiva/sclera: Conjunctivae normal.     Pupils: Pupils are equal, round, and reactive to light.  Neck:     Thyroid: No thyroid mass or thyromegaly.     Vascular: No carotid bruit.  Cardiovascular:     Rate and Rhythm: Normal rate and regular rhythm.     Pulses: Normal pulses.          Radial pulses are 2+ on the right side and 2+ on the left side.     Heart sounds: Normal heart sounds. No murmur heard. Pulmonary:     Effort: Pulmonary effort is normal. No respiratory distress.     Breath sounds: Normal breath sounds. No wheezing, rhonchi or rales.  Abdominal:     General: Bowel sounds are normal. There is no distension.     Palpations: Abdomen is soft. There is no mass.     Tenderness: There is no abdominal tenderness. There is no guarding or rebound.      Hernia: No hernia is present.  Musculoskeletal:        General: Normal range of motion.     Cervical back: Normal range of motion and neck supple.     Right lower leg: No edema.     Left lower leg: No edema.  Lymphadenopathy:     Cervical: No cervical adenopathy.  Skin:    General: Skin is warm and dry.     Findings: No rash.  Neurological:     General: No focal deficit present.     Mental Status: He is alert and oriented to person, place, and time.     Comments:  Recall 3/3 Calculation 5/5 DLROW  Psychiatric:        Mood and Affect: Mood normal.        Behavior: Behavior normal.        Thought Content: Thought content normal.        Judgment: Judgment normal.       Results for orders placed or performed in visit on 01/05/22  Vitamin B1  Result Value Ref Range   Vitamin B1 (Thiamine) 13 8 - 30 nmol/L  Vitamin B12  Result Value Ref Range   Vitamin B-12 321 211 - 911 pg/mL  PSA  Result Value Ref Range   PSA 2.67 0.10 - 4.00 ng/mL  CBC with Differential/Platelet  Result Value Ref Range   WBC 4.8 4.0 - 10.5 K/uL   RBC 3.69 (L) 4.22 - 5.81 Mil/uL   Hemoglobin 12.6 (L) 13.0 - 17.0 g/dL   HCT 36.8 (L) 39.0 - 52.0 %   MCV 99.8 78.0 - 100.0 fl   MCHC 34.3 30.0 - 36.0 g/dL   RDW 13.7 11.5 - 15.5 %   Platelets 231.0 150.0 - 400.0 K/uL   Neutrophils Relative % 39.5 (L) 43.0 - 77.0 %   Lymphocytes Relative 47.0 (H) 12.0 - 46.0 %   Monocytes Relative 10.3 3.0 - 12.0 %   Eosinophils Relative 2.8  0.0 - 5.0 %   Basophils Relative 0.4 0.0 - 3.0 %   Neutro Abs 1.9 1.4 - 7.7 K/uL   Lymphs Abs 2.3 0.7 - 4.0 K/uL   Monocytes Absolute 0.5 0.1 - 1.0 K/uL   Eosinophils Absolute 0.1 0.0 - 0.7 K/uL   Basophils Absolute 0.0 0.0 - 0.1 K/uL  Hemoglobin A1c  Result Value Ref Range   Hgb A1c MFr Bld 5.7 4.6 - 6.5 %  Comprehensive metabolic panel  Result Value Ref Range   Sodium 132 (L) 135 - 145 mEq/L   Potassium 4.7 3.5 - 5.1 mEq/L   Chloride 98 96 - 112 mEq/L   CO2 29 19 - 32 mEq/L    Glucose, Bld 115 (H) 70 - 99 mg/dL   BUN 12 6 - 23 mg/dL   Creatinine, Ser 0.81 0.40 - 1.50 mg/dL   Total Bilirubin 0.5 0.2 - 1.2 mg/dL   Alkaline Phosphatase 71 39 - 117 U/L   AST 17 0 - 37 U/L   ALT 22 0 - 53 U/L   Total Protein 6.5 6.0 - 8.3 g/dL   Albumin 4.3 3.5 - 5.2 g/dL   GFR 89.32 >60.00 mL/min   Calcium 9.4 8.4 - 10.5 mg/dL  Lipid panel  Result Value Ref Range   Cholesterol 174 0 - 200 mg/dL   Triglycerides 51.0 0.0 - 149.0 mg/dL   HDL 81.80 >39.00 mg/dL   VLDL 10.2 0.0 - 40.0 mg/dL   LDL Cholesterol 82 0 - 99 mg/dL   Total CHOL/HDL Ratio 2    NonHDL 92.13   VITAMIN D 25 Hydroxy (Vit-D Deficiency, Fractures)  Result Value Ref Range   VITD 43.22 30.00 - 100.00 ng/mL    Assessment & Plan:   Problem List Items Addressed This Visit     Medicare annual wellness visit, subsequent - Primary (Chronic)    I have personally reviewed the Medicare Annual Wellness questionnaire and have noted 1. The patient's medical and social history 2. Their use of alcohol, tobacco or illicit drugs 3. Their current medications and supplements 4. The patient's functional ability including ADL's, fall risks, home safety risks and hearing or visual impairment. Cognitive function has been assessed and addressed as indicated.  5. Diet and physical activity 6. Evidence for depression or mood disorders The patients weight, height, BMI have been recorded in the chart. I have made referrals, counseling and provided education to the patient based on review of the above and I have provided the pt with a written personalized care plan for preventive services. Provider list updated.. See scanned questionairre as needed for further documentation. Reviewed preventative protocols and updated unless pt declined.       Advanced care planning/counseling discussion (Chronic)    Advanced directives: has at home. HCPOA is daughter, [name]. He and wife decline bringing copy of living will for our records .        Health maintenance examination (Chronic)    Preventative protocols reviewed and updated unless pt declined. Discussed healthy diet and lifestyle.       HLD (hyperlipidemia)    Chronic, adequate on low dose atorvastatin. The 10-year ASCVD risk score (Arnett DK, et al., 2019) is: 34.4%   Values used to calculate the score:     Age: 61 years     Sex: Male     Is Non-Hispanic African American: No     Diabetic: Yes     Tobacco smoker: Yes     Systolic Blood Pressure: 259 mmHg  Is BP treated: Yes     HDL Cholesterol: 81.8 mg/dL     Total Cholesterol: 174 mg/dL       Relevant Medications   lisinopril (ZESTRIL) 20 MG tablet   atorvastatin (LIPITOR) 10 MG tablet   Essential hypertension    Chronic, stable on current regimen - continue.       Relevant Medications   lisinopril (ZESTRIL) 20 MG tablet   atorvastatin (LIPITOR) 10 MG tablet   COPD (chronic obstructive pulmonary disease) (HCC)    Chronic on Wixela inhaler 1 puff daily. Notes increased need for albuterol recently.       Relevant Medications   Albuterol Sulfate (PROAIR RESPICLICK) 932 (90 Base) MCG/ACT AEPB   fluticasone-salmeterol (WIXELA INHUB) 250-50 MCG/ACT AEPB   DEGENERATIVE DISC DISEASE, CERVICAL SPINE    S/p prior C1-3 fusion 04/2020, and recent C6-T3 posterior fusion by Dr Serita Butcher.  Planned f/u with neurosurgery.       Prediabetes    Chronic, stable period.       Vitamin D deficiency    Well repleted on current regimen of vit D 1000 IU daily.        Obesity, Class I, BMI 30-34.9    Encouraged healthy diet and lifestyle for sustainable weight loss.       BPH (benign prostatic hyperplasia)    Sees urology s/p urolift 2022      Habitual alcohol use    Encouraged limiting alcohol use.       Tobacco use    Down to a few cigarettes a week! Encouraged ongoing smoking cessation efforts.  Continues lung cancer screening CT.       Aortic atherosclerosis (HCC)    Continue statin.        Relevant Medications   lisinopril (ZESTRIL) 20 MG tablet   atorvastatin (LIPITOR) 10 MG tablet   Kidney cyst, acquired    Stable to enlarged R kidney cyst measuring 3.9cm on recent lung cancer screen along with possibly new L cyst incompletely visualized - UA 09/2021 WNL.       Macrocytic anemia    Discussed vit B12 intake - and start supplementation if not already taking.       Persistent atrial fibrillation ([place])    Followed by cardiology on eliquis.       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    Dispense:  90 tablet    Refill:  3   atorvastatin (LIPITOR) 10 MG tablet    Sig: TAKE 1 TABLET(10 MG) BY MOUTH DAILY    Dispense:  90 tablet    Refill:  3   Albuterol Sulfate (PROAIR RESPICLICK) 355 (90 Base) MCG/ACT AEPB    Sig: Inhale 2 puffs into the lungs every 6 (six) hours as needed (cough, shortness of breath).    Dispense:  1 each    Refill:  3   No orders of the defined types were placed in this encounter.   Patient instructions: We will request records of colonoscopy by Dr Benson Norway a few years ago.  If interested, check with pharmacy about new 2 shot  shingles series (shingrix).  Good to see you today Return as needed or in 1 year for next physical.  Follow up plan: Return in about 1 year (around 01/13/2023) for annual exam, prior fasting for blood work, medicare wellness visit.  Ria Bush, MD

## 2022-01-12 NOTE — Assessment & Plan Note (Signed)
Advanced directives: has at home. HCPOA is daughter, [name]. He and wife decline bringing copy of living will for our records .

## 2022-01-12 NOTE — Patient Instructions (Addendum)
We will request records of colonoscopy by Dr Benson Norway a few years ago.  If interested, check with pharmacy about new 2 shot shingles series (shingrix).  Good to see you today.  Return as needed or in 1 year for next physical.   Health Maintenance After Age 71 After age 51, you are at a higher risk for certain long-term diseases and infections as well as injuries from falls. Falls are a major cause of broken bones and head injuries in people who are older than age 75. Getting regular preventive care can help to keep you healthy and well. Preventive care includes getting regular testing and making lifestyle changes as recommended by your health care provider. Talk with your health care provider about: Which screenings and tests you should have. A screening is a test that checks for a disease when you have no symptoms. A diet and exercise plan that is right for you. What should I know about screenings and tests to prevent falls? Screening and testing are the best ways to find a health problem early. Early diagnosis and treatment give you the best chance of managing medical conditions that are common after age 58. Certain conditions and lifestyle choices may make you more likely to have a fall. Your health care provider may recommend: Regular vision checks. Poor vision and conditions such as cataracts can make you more likely to have a fall. If you wear glasses, make sure to get your prescription updated if your vision changes. Medicine review. Work with your health care provider to regularly review all of the medicines you are taking, including over-the-counter medicines. Ask your health care provider about any side effects that may make you more likely to have a fall. Tell your health care provider if any medicines that you take make you feel dizzy or sleepy. Strength and balance checks. Your health care provider may recommend certain tests to check your strength and balance while standing, walking, or  changing positions. Foot health exam. Foot pain and numbness, as well as not wearing proper footwear, can make you more likely to have a fall. Screenings, including: Osteoporosis screening. Osteoporosis is a condition that causes the bones to get weaker and break more easily. Blood pressure screening. Blood pressure changes and medicines to control blood pressure can make you feel dizzy. Depression screening. You may be more likely to have a fall if you have a fear of falling, feel depressed, or feel unable to do activities that you used to do. Alcohol use screening. Using too much alcohol can affect your balance and may make you more likely to have a fall. Follow these instructions at home: Lifestyle Do not drink alcohol if: Your health care provider tells you not to drink. If you drink alcohol: Limit how much you have to: 0-1 drink a day for women. 0-2 drinks a day for men. Know how much alcohol is in your drink. In the [place] one drink equals one 12 oz bottle of beer (355 mL), one 5 oz glass of wine (148 mL), or one 1 oz glass of hard liquor (44 mL). Do not use any products that contain nicotine or tobacco. These products include cigarettes, chewing tobacco, and vaping devices, such as e-cigarettes. If you need help quitting, ask your health care provider. Activity  Follow a regular exercise program to stay fit. This will help you maintain your balance. Ask your health care provider what types of exercise are appropriate for you. If you need a cane or walker, use  it as recommended by your health care provider. Wear supportive shoes that have nonskid soles. Safety  Remove any tripping hazards, such as rugs, cords, and clutter. Install safety equipment such as grab bars in bathrooms and safety rails on stairs. Keep rooms and walkways well-lit. General instructions Talk with your health care provider about your risks for falling. Tell your health care provider if: You fall. Be sure to  tell your health care provider about all falls, even ones that seem minor. You feel dizzy, tiredness (fatigue), or off-balance. Take over-the-counter and prescription medicines only as told by your health care provider. These include supplements. Eat a healthy diet and maintain a healthy weight. A healthy diet includes low-fat dairy products, low-fat (lean) meats, and fiber from whole grains, beans, and lots of fruits and vegetables. Stay current with your vaccines. Schedule regular health, dental, and eye exams. Summary Having a healthy lifestyle and getting preventive care can help to protect your health and wellness after age 49. Screening and testing are the best way to find a health problem early and help you avoid having a fall. Early diagnosis and treatment give you the best chance for managing medical conditions that are more common for people who are older than age 74. Falls are a major cause of broken bones and head injuries in people who are older than age 76. Take precautions to prevent a fall at home. Work with your health care provider to learn what changes you can make to improve your health and wellness and to prevent falls. This information is not intended to replace advice given to you by your health care provider. Make sure you discuss any questions you have with your health care provider. Document Revised: 11/23/2020 Document Reviewed: 11/23/2020 Elsevier Patient Education  [place].

## 2022-01-13 NOTE — Assessment & Plan Note (Addendum)
Down to a few cigarettes a week! Encouraged ongoing smoking cessation efforts.  Continues lung cancer screening CT.

## 2022-01-13 NOTE — Assessment & Plan Note (Addendum)
Sees urology s/p urolift 2022

## 2022-01-13 NOTE — Assessment & Plan Note (Signed)
Encouraged healthy diet and lifestyle for sustainable weight loss.

## 2022-01-13 NOTE — Assessment & Plan Note (Signed)
Discussed vit B12 intake - and start supplementation if not already taking.

## 2022-01-13 NOTE — Assessment & Plan Note (Signed)
Followed by cardiology on eliquis.

## 2022-01-13 NOTE — Assessment & Plan Note (Signed)
Chronic, stable period.

## 2022-01-13 NOTE — Assessment & Plan Note (Signed)
Well repleted on current regimen of vit D 1000 IU daily.

## 2022-01-13 NOTE — Assessment & Plan Note (Signed)
Chronic on Wixela inhaler 1 puff daily. Notes increased need for albuterol recently.

## 2022-01-13 NOTE — Assessment & Plan Note (Signed)
S/p prior C1-3 fusion 04/2020, and recent C6-T3 posterior fusion by Dr Serita Butcher.  Planned f/u with neurosurgery.

## 2022-01-13 NOTE — Assessment & Plan Note (Addendum)
Stable to enlarged R kidney cyst measuring 3.9cm on recent lung cancer screen along with possibly new L cyst incompletely visualized - UA 09/2021 WNL.

## 2022-01-13 NOTE — Assessment & Plan Note (Addendum)
Encouraged limiting alcohol use.

## 2022-01-13 NOTE — Assessment & Plan Note (Signed)
Chronic, stable on current regimen - continue. 

## 2022-01-13 NOTE — Assessment & Plan Note (Signed)
Chronic, adequate on low dose atorvastatin. The 10-year ASCVD risk score (Arnett DK, et al., 2019) is: 34.4%   Values used to calculate the score:     Age: 71 years     Sex: Male     Is Non-Hispanic African American: No     Diabetic: Yes     Tobacco smoker: Yes     Systolic Blood Pressure: 953 mmHg     Is BP treated: Yes     HDL Cholesterol: 81.8 mg/dL     Total Cholesterol: 174 mg/dL

## 2022-01-13 NOTE — Assessment & Plan Note (Signed)
Continue statin. 

## 2022-01-26 ENCOUNTER — Encounter: Payer: Self-pay | Admitting: Family Medicine

## 2022-02-03 ENCOUNTER — Telehealth: Payer: Self-pay

## 2022-02-03 NOTE — Chronic Care Management (AMB) (Signed)
    Chronic Care Management Pharmacy Assistant   Name: [name]  MRN: [id number] DOB: Sep 24, 1950   Reason for Encounter: Reminder Call   Conditions to be addressed/monitored: CAD, HTN, HLD, and COPD    Recent office visits:  01/12/22-Javier Gutierrez,MD(PCP)-AWV,discussed screenings,vaccines,encouraged ongoing smoking cessation efforts.Get shringrix-f/u 1 year  Recent consult visits:  01/13/22-Chester Yarbrough,MD([facility]) f/u cervical decompression and fusion,continue to work with Pt,no medication changes f/u 6 months  01/10/22-Andrew Kubinski,DO(ortho) Right lower extremity pain,xrays,HEP,Use Tylenol, topical pain cream, relative rest, massage, and ice/heat as needed for pain. F/u as needed 12/09/21-[facility]-ultrasound-guided right lateral femoral cutaneous nerve hydrodissection and steroid injection 12/03/21-John Poggi,MD-[facility]- no data found 11/25/21-Brian Agbor-Etang,MD(cardio)-f/u heart disease,EKG,no medication changes f/u 1 year  [facility]Hy[facility]ension with mild retinopathy OU 09/28/21-Hao Meng,PA-(cardio)-pre op clearance 09/23/21-[facility](Neurosurg)-left hand weakness,surgery planned,Blood thinners: stop Eliquis 72 hours prior, resume Eliquis 14 days after 09/23/21-[facility]- F/U routine,no medication changes, f/u 6 months  09/07/21-Danielle Koch,PA([facility] )-f/u Neck and arm pain,    Hospital visits:  Medication Reconciliation was completed by comparing discharge summary, patient's EMR and Pharmacy list, and upon discussion with patient.  Admitted to the hospital on 10/11/21 due to Cervical Spinal Fusion . Discharge date was 10/13/21. Discharged from [facility].    New?Medications Started at [facility] Discharge:?? -started methocarbamol Oxycodone Senna Medications Discontinued at Hospital Discharge: -Stopped Eliquis Naproxen Tramadol   Medications that remain the same after Hospital Discharge:??  -All other medications will  remain the same.    Medications: Outpatient Encounter Medications as of 02/03/2022  Medication Sig   Albuterol Sulfate (PROAIR RESPICLICK) 749 (90 Base) MCG/ACT AEPB Inhale 2 puffs into the lungs every 6 (six) hours as needed (cough, shortness of breath).   apixaban (ELIQUIS) 5 MG TABS tablet Take 5 mg by mouth 2 (two) times daily.   atorvastatin (LIPITOR) 10 MG tablet TAKE 1 TABLET(10 MG) BY MOUTH DAILY   Cholecalciferol 25 MCG (1000 UT) tablet Take 1,000 Units by mouth daily.   fluticasone-salmeterol (WIXELA INHUB) 250-50 MCG/ACT AEPB Inhale 1 puff into the lungs daily.   lisinopril (ZESTRIL) 20 MG tablet Take 1 tablet (20 mg total) by mouth daily.   loratadine (CLARITIN) 10 MG tablet Take 10 mg by mouth as needed.   metoprolol tartrate (LOPRESSOR) 50 MG tablet Take 1 tablet (50 mg total) by mouth 2 (two) times daily.   OVER THE COUNTER MEDICATION Take 2 tablets by mouth daily. sulfurzyme   OVER THE COUNTER MEDICATION Take 1,500 mg by mouth daily. BLM Capsules - Young Living Essential Oils 750 mg each   sildenafil (VIAGRA) 50 MG tablet Take 50 mg by mouth daily as needed for erectile dysfunction. Taking 1/2 tablet PRN (from [place])   No facility-administered encounter medications on file as of 02/03/2022.  [name] was contacted to remind of upcoming telephone visit with Charlene Brooke  on 02/08/22 at 8:45am. Patient was reminded to have any blood glucose and blood pressure readings available for review at appointment.   Message was left reminding patient of appointment.   CCM referral has been placed prior to visit?  No    Star Rating Drugs: Medication:  Last Fill: Day Supply Atorvastatin '10mg'$  11/20/21  90 Lisinopril '20mg'$  01/12/22 [place], [place]  CPP notified  [name], [place] Assistant [phone]

## 2022-02-08 ENCOUNTER — Ambulatory Visit: Payer: PPO | Admitting: Pharmacist

## 2022-02-08 DIAGNOSIS — I4819 Other persistent atrial fibrillation: Secondary | ICD-10-CM

## 2022-02-08 DIAGNOSIS — I10 Essential (primary) hypertension: Secondary | ICD-10-CM

## 2022-02-08 DIAGNOSIS — J449 Chronic obstructive pulmonary disease, unspecified: Secondary | ICD-10-CM

## 2022-02-08 DIAGNOSIS — E7800 Pure hypercholesterolemia, unspecified: Secondary | ICD-10-CM

## 2022-02-08 DIAGNOSIS — I700 Atherosclerosis of aorta: Secondary | ICD-10-CM

## 2022-02-08 NOTE — Progress Notes (Signed)
Chronic Care Management Pharmacy Note  02/08/2022 Name:  [name] MRN:  [id number] DOB:  [birth year]  Summary: CCM F/U visit -Reviewed medications; pt gets some meds through the [place] - Eliquis, metoprolol and inhalers -Reviewed COPD; pt reports using Albuterol more often in summer/humidity but otherwise feels well -Pt reports using Aleve ~once a week for musculoskeletal pain; discussed interaction with Eliquis and increased risk for bleeding  Recommendations/Changes made from today's visit: -Advised to use Tylenol instead of Aleve/other NSAIDs due to bleeding risk  Plan: -Transition CCM to Self Care: Patient achieved CCM goals and no longer needs to be contacted as frequently. Patient advised services will still be available to them if they would like to reach out or have any new health concerns. Verified patient had contact information to pharmacist and health concierge on hand. Patient made aware CCM services would be continued if desired.    Subjective: [name] is an 71 y.o. year old male who is a primary patient of Ria Bush, MD.  The CCM team was consulted for assistance with disease management and care coordination needs.    Engaged with patient by telephone for follow up visit in response to provider referral for pharmacy case management and/or care coordination services.   Consent to Services:  The patient was given information about Chronic Care Management services, agreed to services, and gave verbal consent prior to initiation of services.  Please see initial visit note for detailed documentation.   Patient Care Team: Ria Bush, MD as PCP - General (Family Medicine) Kate Sable, MD as PCP - Cardiology (Cardiology) Charlton Haws, [facility] as Pharmacist (Pharmacist)  Recent office visits: 01/12/22-Javier Gutierrez,MD(PCP)-AWV,discussed screenings,vaccines,encouraged ongoing smoking cessation efforts.Get shringrix-f/u 1 year  Recent  consult visits: 01/13/22 Dr Izora Ribas (Neurosurgery)  s/p spinal fusion; continue PT; f/u 6 months  01/10/22 Dr Candelaria Stagers (Ortho): Right lower extremity pain,xrays,HEP,Use Tylenol, topical pain cream, relative rest, massage, and ice/heat as needed for pain. F/u as needed 12/09/21 Dr Candelaria Stagers (Ortho Surg): ultrasound-guided right lateral femoral cutaneous nerve hydrodissection and steroid injection 12/03/21 Dr Roland Rack (Ortho Surg): chronic leg pain, meralgia paresthetica - resume gabapentin f/u Dr Candelaria Stagers for injection.  11/25/21-Brian Agbor-Etang,MD (Cardiology)-f/u heart disease,EKG,no medication changes f/u 1 year   10/11/21 spinal fusion surgery   [facility]-Hypertension with mild retinopathy OU 09/28/21-Hao Meng,PA-(cardio)-pre op clearance 09/23/21-[facility](Neurosurg)-left hand weakness,surgery planned,Blood thinners: stop Eliquis 72 hours prior, resume Eliquis 14 days after 09/23/21 Dr Izora Ribas (Neurosurgery): hand weakness, cervical radiculopathy - rec spinal fusion surgery 09/07/21-Danielle Koch,PA (Neurosurgery)-f/u Neck and arm pain; restart gabapentin, rx'd tramadol  Hospital visits: None in previous 6 months   Objective:  Lab Results  Component Value Date   CREATININE 0.81 01/05/2022   BUN 12 01/05/2022   GFR 89.32 01/05/2022   GFRNONAA >60 10/11/2021   GFRAA 104 04/24/2020   NA 132 (L) 01/05/2022   K 4.7 01/05/2022   CALCIUM 9.4 01/05/2022   CO2 29 01/05/2022   GLUCOSE 115 (H) 01/05/2022    Lab Results  Component Value Date/Time   HGBA1C 5.7 01/05/2022 08:36 AM   HGBA1C 5.7 (A) 09/06/2021 08:26 AM   HGBA1C 5.9 12/29/2020 08:07 AM   HGBA1C 5.8 03/17/2020 12:00 AM   GFR 89.32 01/05/2022 08:36 AM   GFR 82.42 07/02/2021 08:12 AM   MICROALBUR <0.7 03/02/2016 07:59 AM   MICROALBUR 2.1 (H) 09/04/2015 08:26 AM    Last diabetic Eye exam:  Lab Results  Component Value Date/Time   HMDIABEYEEXA No Retinopathy 01/15/2018 12:00  AM    Last diabetic Foot exam: No  results found for: "HMDIABFOOTEX"   Lab Results  Component Value Date   CHOL 174 01/05/2022   HDL 81.80 01/05/2022   LDLCALC 82 01/05/2022   LDLDIRECT 82.0 09/07/2016   TRIG 51.0 01/05/2022   CHOLHDL 2 01/05/2022       Latest Ref Rng & Units 01/05/2022    8:36 AM 12/29/2020    8:07 AM 07/02/2020    8:15 AM  Hepatic Function  Total Protein 6.0 - 8.3 g/dL 6.5  6.8  6.7   Albumin 3.5 - 5.2 g/dL 4.3  4.4  4.4   AST 0 - 37 U/L '17  19  14   ' ALT 0 - 53 U/L '22  25  17   ' Alk Phosphatase 39 - 117 U/L 71  66  78   Total Bilirubin 0.2 - 1.2 mg/dL 0.5  1.0  1.0     Lab Results  Component Value Date/Time   TSH 3.42 03/27/2020 03:04 PM   TSH 1.650 12/08/2013 05:00 AM       Latest Ref Rng & Units 01/05/2022    8:36 AM 10/11/2021    9:57 PM 10/06/2021    1:41 PM  CBC  WBC 4.0 - 10.5 K/uL 4.8  10.1  5.2   Hemoglobin 13.0 - 17.0 g/dL 12.6  13.4  13.0   Hematocrit 39.0 - 52.0 % 36.8  39.4  38.3   Platelets 150.0 - 400.0 K/uL 231.0  180  208     Lab Results  Component Value Date/Time   VD25OH 43.22 01/05/2022 08:36 AM   VD25OH 43.40 12/29/2020 08:07 AM    Clinical ASCVD: Yes  The 10-year ASCVD risk score (Arnett DK, et al., 2019) is: 33.6%   Values used to calculate the score:     Age: 62 years     Sex: Male     Is Non-Hispanic African American: No     Diabetic: Yes     Tobacco smoker: Yes     Systolic Blood Pressure: 628 mmHg     Is BP treated: Yes     HDL Cholesterol: 81.8 mg/dL     Total Cholesterol: 174 mg/dL    CHA2DS2/VAS Stroke Risk Points  Current as of yesterday     4 >= 2 Points: High Risk  1 - 1.99 Points: Medium Risk  0 Points: Low Risk    Last Change: N/A       Points Metrics  0 Has Congestive Heart Failure:  No    Current as of yesterday  1 Has Vascular Disease:  Yes    Current as of yesterday  1 Has Hypertension:  Yes    Current as of yesterday  1 Age:  75    Current as of yesterday  1 Has Diabetes:  Yes     Current as of yesterday  0 Had Stroke:   No  Had TIA:  No  Had Thromboembolism:  No    Current as of yesterday  0 Male:  No    Current as of yesterday        12/29/2020   10:28 AM 10/02/2019   11:16 AM 09/12/2018   12:18 PM  Depression screen PHQ 2/9  Decreased Interest 0 0 0  Down, Depressed, Hopeless 0 0 0  PHQ - 2 Score 0 0 0  Altered sleeping 0 0 0  Tired, decreased energy 0 0 0  Change in appetite 0 0 0  Feeling bad or failure about yourself  0 0 0  Trouble concentrating 0 0 0  Moving slowly or fidgety/restless 0 0 0  Suicidal thoughts 0 0 0  PHQ-9 Score 0 0 0  Difficult doing work/chores Not difficult at all Not difficult at all Not difficult at all     Social History   Tobacco Use  Smoking Status Some Days   Packs/day: 0.50   Years: 40.00   Total pack years: 20.00   Types: Cigarettes  Smokeless Tobacco Never  Tobacco Comments   3 cigarattes over the past 3 weeks as of 10-06-21   BP Readings from Last 3 Encounters:  01/12/22 130/80  11/25/21 130/80  10/13/21 112/66   Pulse Readings from Last 3 Encounters:  01/12/22 80  11/25/21 74  10/13/21 (!) 101   Wt Readings from Last 3 Encounters:  01/12/22 185 lb 6 oz (84.1 kg)  11/25/21 191 lb (86.6 kg)  10/11/21 195 lb 5.2 oz (88.6 kg)   BMI Readings from Last 3 Encounters:  01/12/22 31.33 kg/m  11/25/21 30.83 kg/m  10/11/21 31.53 kg/m    Assessment/Interventions: Review of patient past medical history, allergies, medications, health status, including review of consultants reports, laboratory and other test data, was performed as part of comprehensive evaluation and provision of chronic care management services.   SDOH:  (Social Determinants of Health) assessments and interventions performed: Yes SDOH Interventions    Flowsheet Row Most Recent Value  SDOH Interventions   Food Insecurity Interventions Intervention Not Indicated  Financial Strain Interventions Intervention Not Indicated      SDOH Screenings   Alcohol Screen: Low Risk   (12/29/2020)   Alcohol Screen    Last Alcohol Screening Score (AUDIT): 5  Depression (PHQ2-9): Low Risk  (12/29/2020)   Depression (PHQ2-9)    PHQ-2 Score: 0  Financial Resource Strain: Low Risk  (02/08/2022)   Overall Financial Resource Strain (CARDIA)    Difficulty of Paying Living Expenses: Not very hard  Food Insecurity: No Food Insecurity (02/08/2022)   Hunger Vital Sign    Worried About Running Out of Food in the Last Year: Never true    Ran Out of Food in the Last Year: Never true  Housing: Low Risk  (12/29/2020)   Housing    Last Housing Risk Score: 0  Physical Activity: Inactive (12/29/2020)   Exercise Vital Sign    Days of Exercise per Week: 0 days    Minutes of Exercise per Session: 0 min  Social Connections: Not on file  Stress: No Stress Concern Present (12/29/2020)   [place]    Feeling of Stress : Not at all  Tobacco Use: High Risk (01/26/2022)   Patient History    Smoking Tobacco Use: Some Days    Smokeless Tobacco Use: Never    Passive Exposure: Not on file  Transportation Needs: No Transportation Needs (12/29/2020)   PRAPARE - Transportation    Lack of Transportation (Medical): No    Lack of Transportation (Non-Medical): No    CCM Care Plan  Allergies  Allergen Reactions   Chantix [Varenicline] Other (See Comments)    headaches    Medications Reviewed Today     Reviewed by Charlton Haws, [facility] (Pharmacist) on 02/08/22 at [place] List Status: <None>   Medication Order Taking? Sig Documenting Provider Last Dose Status Informant  Albuterol Sulfate (PROAIR RESPICLICK) 160 (90 Base) MCG/ACT AEPB [id number] Yes Inhale 2 puffs into the lungs  every 6 (six) hours as needed (cough, shortness of breath). Ria Bush, MD Taking Active   apixaban (ELIQUIS) 5 MG TABS tablet [id number] Yes Take 5 mg by mouth 2 (two) times daily. [provider] Taking Active   atorvastatin (LIPITOR) 10  MG tablet [id number] Yes TAKE 1 TABLET(10 MG) BY MOUTH DAILY Ria Bush, MD Taking Active   Cholecalciferol 25 MCG (1000 UT) tablet [id number] Yes Take 1,000 Units by mouth daily. [provider] Taking Active Self  fluticasone-salmeterol [place] INHUB) 250-50 MCG/ACT AEPB [id number] Yes Inhale 1 puff into the lungs daily. Ria Bush, MD Taking Active   lisinopril (ZESTRIL) 20 MG tablet [id number] Yes Take 1 tablet (20 mg total) by mouth daily. Ria Bush, MD Taking Active   loratadine (CLARITIN) 10 MG tablet [id number] Yes Take 10 mg by mouth as needed. [provider] Taking Active Self  metoprolol tartrate (LOPRESSOR) 50 MG tablet [id number] Yes Take 1 tablet (50 mg total) by mouth 2 (two) times daily. Kate Sable, MD Taking Active            Med Note Marthann Schiller Oct 06, 2021  2:27 PM)    OVER THE COUNTER MEDICATION [id number] Yes Take 2 tablets by mouth daily. sulfurzyme [provider] Taking Active Self  OVER THE COUNTER MEDICATION [id number] Yes Take 1,500 mg by mouth daily. BLM Capsules - Young Living Essential Oils 750 mg each [provider] Taking Active Self  sildenafil (VIAGRA) 50 MG tablet [id number] Yes Take 50 mg by mouth daily as needed for erectile dysfunction. 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 [facility]) 05/14/2020   Spinal stenosis of lumbar region with neurogenic claudication 04/21/2020   Incomplete emptying of bladder 04/08/2020   Pre-op evaluation 03/27/2020   Persistent atrial fibrillation ([place]) 03/27/2020   Macrocytic anemia 06/20/2019   Lumbar radiculopathy, chronic 04/24/2019   Kidney cyst,  acquired 12/12/2017   Aortic atherosclerosis ([place]) 09/15/2016   CAD (coronary artery disease) 09/15/2016   Tinea pedis of both feet 03/07/2016   Tobacco use 09/30/2015   Advanced care planning/counseling discussion 09/03/2014   Primary osteoarthritis of right knee 06/27/2014   Status post total right knee replacement 06/27/2014   Habitual alcohol use 12/08/2013   Medicare annual wellness visit, subsequent 08/25/2011   Vitamin D deficiency 08/26/2010   Obesity, Class I, BMI 30-34.9 08/26/2010   BPH (benign prostatic hyperplasia) 08/26/2010   Onychomycosis 02/08/2010   PREMATURE VENTRICULAR CONTRACTIONS 11/17/2009   PVD 11/06/2008   Snoring 11/06/2008   HLD (hyperlipidemia) 07/31/2007   Essential hypertension 07/31/2007   Allergic rhinitis 07/31/2007   COPD (chronic obstructive pulmonary disease) ([place]) 07/31/2007   DEGENERATIVE [place] DISEASE, CERVICAL SPINE 07/31/2007   Prediabetes 07/31/2007   History of hepatitis C 07/31/2007    Immunization History  Administered Date(s) Administered   Fluad Quad(high Dose 65+) 04/23/2019, 04/21/2020   Influenza Split 04/17/2017   Influenza Whole 05/18/2005   Influenza, High Dose Seasonal PF 04/26/2018   Influenza,inj,Quad PF,6+ Mos 04/16/2014, 04/14/2015, 04/15/2016, 05/05/2017   Influenza,trivalent, recombinat, inj, PF 04/10/2018   Influenza-Unspecified 04/18/2019, 04/17/2020   Moderna Sars-Covid-2 Vaccination 10/23/2020   PFIZER(Purple Top)SARS-COV-2 Vaccination 08/24/2019, 09/17/2019  Pneumococcal Conjugate-13 09/09/2016, 04/10/2018   Pneumococcal Polysaccharide-23 08/26/2013, 09/12/2018   Td 10/18/2002   Tdap 08/24/2012, 04/17/2014   Zoster, Live 10/03/2013    Conditions to be addressed/monitored:  Hypertension, Hyperlipidemia, Atrial Fibrillation, Coronary Artery Disease, and COPD  Care Plan : [place]  Updates made by Charlton Haws, [place] since 02/08/2022 12:00 AM     Problem: Hypertension, Hyperlipidemia,  Atrial Fibrillation, Coronary Artery Disease, and COPD      Long-Range Goal: Disease mgmt   Start Date: 01/15/2021  Expected End Date: 02/08/2022  This Visit's Progress: On track  Priority: High  Note:   Current Barriers:  None identified  Pharmacist Clinical Goal(s):  Patient will contact provider office for questions/concerns as evidenced notation of same in electronic health record through collaboration with PharmD and provider.   Interventions: 1:1 collaboration with Ria Bush, MD regarding development and update of comprehensive plan of care as evidenced by provider attestation and co-signature Inter-disciplinary care team collaboration (see longitudinal plan of care) Comprehensive medication review performed; medication list updated in electronic medical record   Hypertension (BP goal <140/90) -Controlled - per clinic readings -Denies hypotensive/hypertensive symptoms -Current treatment: Lisinopril 20 mg daily - Appropriate, Effective, Safe, Accessible Metoprolol tartrate 50 mg BID - Appropriate, Effective, Safe, Accessible -Medications previously tried: none   -Educated on BP goals and benefits of medications for prevention of heart attack, stroke and kidney damage; -Counseled to monitor BP at home periodically -Recommended to continue current medication   Atrial Fibrillation (Goal: prevent stroke and major bleeding) -Controlled - per cardiology; of note pt reports using Aleve ~once a week for MSK pain -CHADSVASC: 4; failed DCCV 04/2020; follows with cardiology, Dr Garen Lah -Current treatment: Metoprolol tartrate 50 mg BID - Appropriate, Effective, Safe, Accessible ([place]) Eliquiz 5 mg BID - Appropriate, Effective, Safe, Accessible ([place]) -Medications previously tried: Xarelto -Counseled on bleeding risk associated with Eliquis and importance of self-monitoring for signs/symptoms of bleeding; avoidance of NSAIDs due to increased bleeding risk with  anticoagulants; -Advised to use Tylenol instead of Aleve/other NSAIDs  Hyperlipidemia: (LDL goal < 70) -Controlled - LDL 82 (12/2021) close to goal -Hx CAD (aortic atherosclerosis on CT); not on aspirin due to Eliquis -Current treatment: Atorvastatin 10 mg daily - Appropriate, Effective, Safe, Accessible -Medications previously tried: n/a  -Educated on Cholesterol goals;  -Recommended to continue current medication  COPD (Goal: control symptoms and prevent exacerbations) -Controlled - he denies frequent SOB; he uses Wixela once daily, Proair a few times a week -PFTs: 03/05/2013 Simple spirometry > ratio 66%, FEV1 1.53 L (45%), FVC 55% pred -Current treatment : Wixela (fluticasone-salmeterol) 250-50 mcg 1 puff daily - Appropriate, Effective, Safe, Accessible ([place]) Proair Respiclick PRN - Appropriate, Effective, Safe, Accessible -Medications previously tried: Advair, Spiriva -Exacerbations requiring treatment in last 6 months: none -Patient reports consistent use of maintenance inhaler -Frequency of rescue inhaler use: twice weekly (depending on weather) -Recommended to continue current medication  Health Maintenance -Vaccine gaps: Shingrix -s/p spinal fusion 10/11/21, following with [facility] neurosurgery -Hemoglobin slightly low 12/2021 - PCP advised to increase iron intake. Attached iron-rich diet handout to Mychart AVS  Patient Goals/Self-Care Activities Patient will:  - take medications as prescribed as evidenced by patient report and record review focus on medication adherence by routine check blood pressure periodically, document, and provide at future appointments engage in dietary modifications by increasing iron intake (see handout)       Medication Assistance: None required.  Patient affirms current coverage meets needs.  Compliance/Adherence/Medication fill  history: Care Gaps: Colon cancer screening (FOBT)  Star-Rating Drugs: Atorvastatin - PDC 100% Lisinopril - PDC  100%  Medication Access: Within the past 30 days, how often has patient missed a dose of medication? 0 Is a pillbox or other method used to improve adherence? No  Factors that may affect medication adherence? no barriers identified Are meds synced by current pharmacy? No  Are meds delivered by current pharmacy? No  Does patient experience delays in picking up medications due to transportation concerns? No   [facility] Services Reviewed: Is patient disadvantaged to use [facility]?: Yes  Current Rx insurance plan: H[employer] / [place] Name and location of Current pharmacy:  [facility] [place], [place] - [place] [address] [place] [address] Phone: [phone] Fax: [phone]  [facility] services reviewed with patient today?: No  Patient requests to transfer care to [facility]?: No  Reason patient declined to change pharmacies: Disadvantaged due to insurance/mail order   Care Plan and Follow Up Patient Decision:  Patient agrees to Care Plan and Follow-up.  Plan: The patient has been provided with contact information for the care management team and has been advised to call with any health related questions or concerns.   Charlene Brooke, PharmD, BCACP Clinical Pharmacist [facility] [phone]

## 2022-02-08 NOTE — Patient Instructions (Addendum)
Visit Information  Phone number for Pharmacist: [phone]   Goals Addressed   None     Care Plan : [place]  Updates made by Charlton Haws, RPH since 02/08/2022 12:00 AM     Problem: Hypertension, Hyperlipidemia, Atrial Fibrillation, Coronary Artery Disease, and COPD      Long-Range Goal: Disease mgmt   Start Date: 01/15/2021  Expected End Date: 02/08/2022  This Visit's Progress: On track  Priority: High  Note:   Current Barriers:  None identified  Pharmacist Clinical Goal(s):  Patient will contact provider office for questions/concerns as evidenced notation of same in electronic health record through collaboration with PharmD and provider.   Interventions: 1:1 collaboration with Ria Bush, MD regarding development and update of comprehensive plan of care as evidenced by provider attestation and co-signature Inter-disciplinary care team collaboration (see longitudinal plan of care) Comprehensive medication review performed; medication list updated in electronic medical record   Hypertension (BP goal <140/90) -Controlled - per clinic readings -Denies hypotensive/hypertensive symptoms -Current treatment: Lisinopril 20 mg daily - Appropriate, Effective, Safe, Accessible Metoprolol tartrate 50 mg BID - Appropriate, Effective, Safe, Accessible -Medications previously tried: none   -Educated on BP goals and benefits of medications for prevention of heart attack, stroke and kidney damage; -Counseled to monitor BP at home periodically -Recommended to continue current medication   Atrial Fibrillation (Goal: prevent stroke and major bleeding) -Controlled - per cardiology; of note pt reports using Aleve ~once a week for MSK pain -CHADSVASC: 4; failed DCCV 04/2020; follows with cardiology, Dr Garen Lah -Current treatment: Metoprolol tartrate 50 mg BID - Appropriate, Effective, Safe, Accessible ([place]) Eliquiz 5 mg BID - Appropriate, Effective, Safe,  Accessible ([place]) -Medications previously tried: Xarelto -Counseled on bleeding risk associated with Eliquis and importance of self-monitoring for signs/symptoms of bleeding; avoidance of NSAIDs due to increased bleeding risk with anticoagulants; -Advised to use Tylenol instead of Aleve/other NSAIDs  Hyperlipidemia: (LDL goal < 70) -Controlled - LDL 82 (12/2021) close to goal -Hx CAD (aortic atherosclerosis on CT); not on aspirin due to Eliquis -Current treatment: Atorvastatin 10 mg daily - Appropriate, Effective, Safe, Accessible -Medications previously tried: n/a  -Educated on Cholesterol goals;  -Recommended to continue current medication  COPD (Goal: control symptoms and prevent exacerbations) -Controlled - he denies frequent SOB; he uses Wixela once daily, Proair a few times a week -PFTs: 03/05/2013 Simple spirometry > ratio 66%, FEV1 1.53 L (45%), FVC 55% pred -Current treatment : Wixela (fluticasone-salmeterol) 250-50 mcg 1 puff daily - Appropriate, Effective, Safe, Accessible ([place]) Proair Respiclick PRN - Appropriate, Effective, Safe, Accessible -Medications previously tried: Advair, Spiriva -Exacerbations requiring treatment in last 6 months: none -Patient reports consistent use of maintenance inhaler -Frequency of rescue inhaler use: twice weekly (depending on weather) -Recommended to continue current medication  Health Maintenance -Vaccine gaps: Shingrix -s/p spinal fusion 10/11/21, following with [facility] neurosurgery -Hemoglobin slightly low 12/2021 - PCP advised to increase iron intake. Attached iron-rich diet handout to Mychart AVS  Patient Goals/Self-Care Activities Patient will:  - take medications as prescribed as evidenced by patient report and record review focus on medication adherence by routine check blood pressure periodically, document, and provide at future appointments engage in dietary modifications by increasing iron intake (see handout)       Patient  verbalizes understanding of instructions and care plan provided today and agrees to view in [place]. Active MyChart status and patient understanding of how to access instructions and care plan via MyChart confirmed with patient.  The patient has been provided with contact information for the care management team and has been advised to call with any health related questions or concerns.   Charlene Brooke, PharmD, BCACP Clinical Pharmacist [facility] [phone]

## 2022-07-13 ENCOUNTER — Other Ambulatory Visit: Payer: Self-pay

## 2022-07-13 DIAGNOSIS — Z981 Arthrodesis status: Secondary | ICD-10-CM

## 2022-07-19 ENCOUNTER — Ambulatory Visit: Payer: PPO | Admitting: Neurosurgery

## 2022-08-16 ENCOUNTER — Ambulatory Visit
Admission: RE | Admit: 2022-08-16 | Discharge: 2022-08-16 | Disposition: A | Discharge status: Home / Self Care | Payer: PPO | Source: Ambulatory Visit | Attending: Family Medicine | Admitting: Family Medicine

## 2022-08-16 DIAGNOSIS — Z87891 Personal history of nicotine dependence: Secondary | ICD-10-CM | POA: Insufficient documentation

## 2022-08-16 DIAGNOSIS — F17210 Nicotine dependence, cigarettes, uncomplicated: Secondary | ICD-10-CM | POA: Insufficient documentation

## 2022-08-18 ENCOUNTER — Other Ambulatory Visit: Payer: Self-pay

## 2022-08-18 DIAGNOSIS — F17210 Nicotine dependence, cigarettes, uncomplicated: Secondary | ICD-10-CM

## 2022-08-18 DIAGNOSIS — Z87891 Personal history of nicotine dependence: Secondary | ICD-10-CM

## 2022-08-18 HISTORY — PX: HIP SURGERY: SHX245

## 2022-09-02 ENCOUNTER — Encounter: Payer: Self-pay | Admitting: Family Medicine

## 2022-09-23 LAB — LAB REPORT - SCANNED
A1c: 5.9
EGFR (Non-African Amer.): 92

## 2023-01-09 ENCOUNTER — Other Ambulatory Visit (INDEPENDENT_AMBULATORY_CARE_PROVIDER_SITE_OTHER): Payer: PPO

## 2023-01-09 ENCOUNTER — Other Ambulatory Visit: Payer: Self-pay | Admitting: Family Medicine

## 2023-01-09 DIAGNOSIS — N400 Enlarged prostate without lower urinary tract symptoms: Secondary | ICD-10-CM

## 2023-01-09 DIAGNOSIS — D539 Nutritional anemia, unspecified: Secondary | ICD-10-CM | POA: Diagnosis not present

## 2023-01-09 DIAGNOSIS — R7303 Prediabetes: Secondary | ICD-10-CM | POA: Diagnosis not present

## 2023-01-09 DIAGNOSIS — E7800 Pure hypercholesterolemia, unspecified: Secondary | ICD-10-CM

## 2023-01-09 DIAGNOSIS — E559 Vitamin D deficiency, unspecified: Secondary | ICD-10-CM

## 2023-01-09 LAB — PSA: PSA: 2.38 ng/mL (ref 0.10–4.00)

## 2023-01-09 LAB — COMPREHENSIVE METABOLIC PANEL
ALT: 27 U/L (ref 0–53)
AST: 23 U/L (ref 0–37)
Albumin: 4.4 g/dL (ref 3.5–5.2)
Alkaline Phosphatase: 80 U/L (ref 39–117)
BUN: 8 mg/dL (ref 6–23)
CO2: 28 mEq/L (ref 19–32)
Calcium: 9.6 mg/dL (ref 8.4–10.5)
Chloride: 96 mEq/L (ref 96–112)
Creatinine, Ser: 0.83 mg/dL (ref 0.40–1.50)
GFR: 88.04 mL/min (ref >60.00)
Glucose, Bld: 121 mg/dL — ABNORMAL HIGH (ref 70–99)
Potassium: 4.7 mEq/L (ref 3.5–5.1)
Sodium: 131 mEq/L — ABNORMAL LOW (ref 135–145)
Total Bilirubin: 1.3 mg/dL — ABNORMAL HIGH (ref 0.2–1.2)
Total Protein: 6.6 g/dL (ref 6.0–8.3)

## 2023-01-09 LAB — CBC WITH DIFFERENTIAL/PLATELET
Basophils Absolute: 0 10*3/uL (ref 0.0–0.1)
Basophils Relative: 0.5 % (ref 0.0–3.0)
Eosinophils Absolute: 0.2 10*3/uL (ref 0.0–0.7)
Eosinophils Relative: 4.3 % (ref 0.0–5.0)
HCT: 37.2 % — ABNORMAL LOW (ref 39.0–52.0)
Hemoglobin: 12.5 g/dL — ABNORMAL LOW (ref 13.0–17.0)
Lymphocytes Relative: 41.3 % (ref 12.0–46.0)
Lymphs Abs: 2.1 10*3/uL (ref 0.7–4.0)
MCHC: 33.7 g/dL (ref 30.0–36.0)
MCV: 102.3 fl — ABNORMAL HIGH (ref 78.0–100.0)
Monocytes Absolute: 0.5 10*3/uL (ref 0.1–1.0)
Monocytes Relative: 9.6 % (ref 3.0–12.0)
Neutro Abs: 2.2 10*3/uL (ref 1.4–7.7)
Neutrophils Relative %: 44.3 % (ref 43.0–77.0)
Platelets: 208 10*3/uL (ref 150.0–400.0)
RBC: 3.63 Mil/uL — ABNORMAL LOW (ref 4.22–5.81)
RDW: 13.9 % (ref 11.5–15.5)
WBC: 5.1 10*3/uL (ref 4.0–10.5)

## 2023-01-09 LAB — LIPID PANEL
Cholesterol: 152 mg/dL (ref 0–200)
HDL: 85.9 mg/dL (ref >39.00)
LDL Cholesterol: 58 mg/dL (ref 0–99)
NonHDL: 65.95
Total CHOL/HDL Ratio: 2
Triglycerides: 39 mg/dL (ref 0.0–149.0)
VLDL: 7.8 mg/dL (ref 0.0–40.0)

## 2023-01-09 LAB — VITAMIN D 25 HYDROXY (VIT D DEFICIENCY, FRACTURES): VITD: 49.87 ng/mL (ref 30.00–100.00)

## 2023-01-09 LAB — HEMOGLOBIN A1C: Hgb A1c MFr Bld: 5.8 % (ref 4.6–6.5)

## 2023-01-09 LAB — VITAMIN B12: Vitamin B-12: 567 pg/mL (ref 211–911)

## 2023-01-11 ENCOUNTER — Ambulatory Visit: Payer: PPO | Attending: Cardiology | Admitting: Cardiology

## 2023-01-11 ENCOUNTER — Encounter: Payer: Self-pay | Admitting: Cardiology

## 2023-01-11 VITALS — BP 138/92 | HR 75 | Ht 66.0 in | Wt 198.4 lb

## 2023-01-11 DIAGNOSIS — I4819 Other persistent atrial fibrillation: Secondary | ICD-10-CM

## 2023-01-11 DIAGNOSIS — I2510 Atherosclerotic heart disease of native coronary artery without angina pectoris: Secondary | ICD-10-CM

## 2023-01-11 DIAGNOSIS — F17200 Nicotine dependence, unspecified, uncomplicated: Secondary | ICD-10-CM | POA: Diagnosis not present

## 2023-01-11 DIAGNOSIS — I10 Essential (primary) hypertension: Secondary | ICD-10-CM

## 2023-01-11 NOTE — Progress Notes (Signed)
Cardiology Office Note:    Date:  01/11/2023   ID:  [name], DOB 03/21/1951, MRN [id number]  PCP:  Eustaquio Boyden, MD  [facility] Cardiologist:  Debbe Odea, MD  [facility] Electrophysiologist:  None   Referring MD: Eustaquio Boyden, MD   Chief Complaint  Patient presents with   Follow-up    Fasting labs were drawn at PCP on 01/09/23 (results available in chart).    History of Present Illness:    [name] is a 72 y.o. male with a hx of persistent A. fib on Eliquis, nonobstructive CAD (LHC 2016 LAD 30%, RCA 20%), hypertension, hyperlipidemia,  COPD, current smoker x40+ years, DDD, who presents for follow-up.   Feels well, denies palpitations, dizziness.  Has occasional bruises with Eliquis, no significant bleeding.  Has cut back on smoking significantly, 1 pack now lasts about a month.  Compliant with medications as prescribed.  Prior notes  patient with history of neck pains, back pains with radiculopathy.  Diagnosed with cervical spinal stenosis.  Laminectomy is being planned.  Saw primary care provider for preop exam, EKG noted to be in atrial fibrillation rapid ventricular response.  He was started on Eliquis and Lopressor.  Echo 03/2020 showed low normal systolic function, EF 50%. DC cardioversion was attempted on 05/06/2020 unsuccessfully. DC cardioversion attempt on 04/2020 was unsuccessful.  Evaluated by EP, deemed not a good candidate for follow-up ablation given history of COPD and ongoing smoker.     Past Medical History:  Diagnosis Date   Allergic rhinitis 07/1998   Aortic atherosclerosis (HCC) 09/17/2016   Arthritis    BACK AND RIGHT KNEE   CAD (coronary artery disease) 07/01/2015   a.) LHC 07/01/2015: normal LV function; 30% p-mLAD, 20% p-mRCA; intervention deferred opting for medical mgmt.   COPD (chronic obstructive pulmonary disease) (HCC) 07/1998   centrilobular emphysema (09/2016) w ongoing tobacco use, spirometry (02/2013)   DDD  (degenerative disc disease), cervical    DOE (dyspnea on exertion)    Erectile dysfunction    a.) on PDE5i (sildenafil)   ETOH abuse    a.) consumes 4-5 beers on a daily basis   History of exposure to asbestos    HTN (hypertension) 05/1998   Hyperlipidemia 06/1996   Hypertensive retinopathy    Long term current use of anticoagulant    a.) apixaban   Lumbar spinal stenosis 03/2013   a.) s/p laminectomy   Macrocytic anemia    Migraines    Peripheral vascular disease (HCC)    Persistent atrial fibrillation (HCC)    a.) CHA2DS2-VASc = 3 (age, HTN, aortic plaque). 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   Smoker     Past Surgical History:  Procedure Laterality Date   ANTERIOR CERVICAL DECOMP/DISCECTOMY FUSION  2003   C3-7   APPLICATION OF INTRAOPERATIVE CT SCAN N/A 10/11/2021   Procedure: APPLICATION OF INTRAOPERATIVE CT SCAN;  Surgeon: Venetia Night, MD;  Location: [facility] ORS;  Service: Neurosurgery;  Laterality: N/A;   BACK SURGERY  2007   CARDIAC CATHETERIZATION N/A 07/01/2015   Procedure: Left Heart Cath and Coronary Angiography;  Surgeon: Iran Ouch, MD;  Location: [facility];  Service: Cardiovascular;  Laterality: N/A;   CARDIOVERSION N/A 05/06/2020   Procedure: CARDIOVERSION;  Surgeon: Debbe Odea, MD;  Location: [facility] ORS;  Service: Cardiovascular;  Laterality: N/A;   COLONOSCOPY  09/2010   HPx2, diverticula, hemorrhoids, rpt 10 yrs Elnoria Howard)   COLONOSCOPY  01/07/2021   SSAx2, diverticulosis, rpt 7 yrs Elnoria Howard)   CYSTOSCOPY WITH INSERTION OF UROLIFT N/A 11/02/2020   Procedure: CYSTOSCOPY WITH INSERTION OF UROLIFT;  Surgeon: Vanna Scotland, MD;  Location: [facility] ORS;  Service: Urology;  Laterality: N/A;   ETT  2010    normal, ABIs normal   HIP SURGERY Right 08/2022   R lateral femoral cutaneous nerve decompression/transposition   KNEE ARTHROSCOPY Right 2011   [place], ligaments   KNEE ARTHROSCOPY Left 1984   LUMBAR LAMINECTOMY  06/2020   Bilateral L2-3 laminectomy Lovell Sheehan)   LUMBAR LAMINECTOMY/DECOMPRESSION MICRODISCECTOMY Left 03/27/2013   Procedure: LUMBAR LAMINECTOMY/DECOMPRESSION MICRODISCECTOMY 2 LEVELS;  Surgeon: Cristi Loron, MD;  Location: [facility] NEURO [facility];  Service: Neurosurgery;  Laterality: Left;  LEFT Lumbar Three Four diskectomy with Lumbar Three-Four, Four-Five Laminectomy. L3/4 ahd L4/5 for LSS Lovell Sheehan)    LUMBAR LAMINECTOMY/DECOMPRESSION MICRODISCECTOMY Bilateral 07/15/2020   Procedure: LAMINECTOMY AND FORAMINOTOMY BILATERAL LUMBAR TWO-LUMBAR THREE;  Surgeon: Tressie Stalker, MD;  [facility];  Service: Neurosurgery;  Laterality: Bilateral;   POSTERIOR CERVICAL FUSION/FORAMINOTOMY N/A 05/14/2020   Procedure: LAMINECTOMY, POSTERIOR INSTRUMENTATION AND FUSION, CERVICAL ONE- CERVICAL TWO, CERVICAL TWO- CERVICAL THREE;  Surgeon: Tressie Stalker, MD;  Location: [facility] OR;  Service: Neurosurgery;  Laterality: N/A;   POSTERIOR CERVICAL FUSION/FORAMINOTOMY N/A 10/11/2021   Procedure: C6-T3 POSTERIOR FUSION;  Surgeon: Venetia Night, MD;  Location: [facility] ORS;  Service: Neurosurgery;  Laterality: N/A;   POSTERIOR CERVICAL LAMINECTOMY N/A 10/11/2021   Procedure: C6-T2 POSTERIOR SPINAL DECOMPRESSION;  Surgeon: Venetia Night, MD;  Location: [facility] ORS;  Service: Neurosurgery;  Laterality: N/A;   SHOULDER ARTHROSCOPY Right 2011   spriometry  02/2013   COPD with some restriction thought to be obesity related ([place])   TOTAL KNEE ARTHROPLASTY Right 06/27/2014   Procedure: RIGHT TOTAL KNEE ARTHROPLASTY;  Surgeon: Kathryne Hitch, MD;  Location: [facility] ORS;  Service: Orthopedics;  Laterality: Right;   US ECHOCARDIOGRAPHY  01/2013   WNL, mild diastolic dysfunction    Current  Medications: Current Meds  Medication Sig   Albuterol Sulfate (PROAIR RESPICLICK) 108 (90 Base) MCG/ACT AEPB Inhale 2 puffs into the lungs every 6 (six) hours as needed (cough, shortness of breath).   apixaban (ELIQUIS) 5 MG TABS tablet Take 5 mg by mouth 2 (two) times daily.   atorvastatin (LIPITOR) 10 MG tablet TAKE 1 TABLET(10 MG) BY MOUTH DAILY   Cholecalciferol 25 MCG (1000 UT) tablet Take 1,000 Units by mouth daily.   ferrous sulfate 325 (65 FE) MG EC tablet Take 325 mg by mouth daily with breakfast.   fluticasone-salmeterol (WIXELA INHUB) 250-50 MCG/ACT AEPB Inhale 1 puff into the lungs daily.   lisinopril (ZESTRIL) 20 MG tablet Take 1 tablet (20 mg total) by mouth daily.   loratadine (CLARITIN) 10 MG tablet Take 10 mg by mouth as needed.   metoprolol tartrate (LOPRESSOR) 50 MG tablet Take 1 tablet (50 mg total) by mouth 2 (two) times daily.   OVER THE COUNTER MEDICATION Take 2 tablets by mouth daily. sulfurzyme   OVER THE COUNTER MEDICATION Take 1,500 mg by mouth daily. BLM Capsules - Young Living Essential Oils 750 mg each   sildenafil (VIAGRA) 50 MG tablet Take 50 mg by mouth daily as needed for erectile dysfunction. Taking 1/2 tablet PRN (from [facility])     Allergies:  Chantix [varenicline]   Social History   Socioeconomic History   Marital status: Married    Spouse name: Not on file   Number of children: Not on file   Years of education: Not on file   Highest education level: Not on file  Occupational History   Not on file  Tobacco Use   Smoking status: Some Days    Packs/day: 0.50    Years: 40.00    Additional pack years: 0.00    Total pack years: 20.00    Types: Cigarettes   Smokeless tobacco: Never   Tobacco comments:    3 cigarattes over the past 3 weeks as of 10-06-21  Vaping Use   Vaping Use: Never used  Substance and Sexual Activity   Alcohol use: Yes    Alcohol/week: 28.0 standard drinks of alcohol    Types: 28 Cans of beer per week    Comment: 4-5  beers daily   Drug use: No   Sexual activity: Not Currently  Other Topics Concern   Not on file  Social History Narrative   Lives with wife. Has 1 son, 2 daughters.    Occupation: Event organiser.    Activity: no exercise   Diet: good water, fruits/vegetables daily      Screened positive for OSA at recent hospitalization (03/2013)   Social Determinants of Health   Financial Resource Strain: Low Risk  (02/08/2022)   Overall Financial Resource Strain (CARDIA)    Difficulty of Paying Living Expenses: Not very hard  Food Insecurity: No Food Insecurity (02/08/2022)   Hunger Vital Sign    Worried About Running Out of Food in the Last Year: Never true    Ran Out of Food in the Last Year: Never true  Transportation Needs: No Transportation Needs (12/29/2020)   PRAPARE - Administrator, Civil Service (Medical): No    Lack of Transportation (Non-Medical): No  Physical Activity: Inactive (12/29/2020)   Exercise Vital Sign    Days of Exercise per Week: 0 days    Minutes of Exercise per Session: 0 min  Stress: No Stress Concern Present (12/29/2020)   [employer] of Occupational Health - Occupational Stress Questionnaire    Feeling of Stress : Not at all  Social Connections: Not on file     Family History: The patient's family history includes Coronary artery disease in his father and mother; Diabetes in his father; Hypertension in his father and mother; Irregular heart beat in his brother; Maple syrup urine disease in his brother; Pneumonia in his sister; Stroke in his father and mother. There is no history of Cancer.  ROS:   Please see the history of present illness.     All other systems reviewed and are negative.  EKGs/Labs/Other Studies Reviewed:    The following studies were reviewed today:   EKG:EKG Interpretation  Date/Time:  Wednesday January 11 2023 09:31:48 EDT Ventricular Rate:  75 PR Interval:    QRS Duration: 88 QT Interval:  366 QTC  Calculation: 408 R Axis:   40 Text Interpretation: Atrial fibrillation Nonspecific ST abnormality Confirmed by Debbe Odea ([id number]) on 01/11/2023 9:50:54 AM    Recent Labs: 01/09/2023: ALT 27; BUN 8; Creatinine, Ser 0.83; Hemoglobin 12.5; Platelets 208.0; Potassium 4.7; Sodium 131  Recent Lipid Panel    Component Value Date/Time   CHOL 152 01/09/2023 0738   TRIG 39.0 01/09/2023 0738   HDL 85.90 01/09/2023 0738   CHOLHDL 2 01/09/2023 0738   VLDL 7.8 01/09/2023 0738  LDLCALC 58 01/09/2023 0738   LDLDIRECT 82.0 09/07/2016 0900    Physical Exam:    VS:  BP (!) 138/92 (BP Location: Left Arm, Patient Position: Sitting, Cuff Size: Normal)   Pulse 75   Ht 5\' 6"  (1.676 m)   Wt 198 lb 6.4 oz (90 kg)   SpO2 98%   BMI 32.02 kg/m     Wt Readings from Last 3 Encounters:  01/11/23 198 lb 6.4 oz (90 kg)  01/12/22 185 lb 6 oz (84.1 kg)  11/25/21 191 lb (86.6 kg)     GEN:  Well nourished, well developed in no acute distress HEENT: Normal NECK: No JVD; No carotid bruits CARDIAC: Irregular irregular, no murmurs, rubs, gallops RESPIRATORY: Decreased breath sounds, no wheezing ABDOMEN: Soft, non-tender, non-distended MUSCULOSKELETAL:  No edema; No deformity  SKIN: Warm and dry NEUROLOGIC:  Alert and oriented x 3 PSYCHIATRIC:  Normal affect   ASSESSMENT:    1. Persistent atrial fibrillation (HCC)   2. Coronary artery disease involving native coronary artery of native heart without angina pectoris   3. Primary hypertension   4. Smoking    PLAN:    In order of problems listed above:  persistent atrial fibrillation. Failed DCCV on 04/2020 CHA2DS2-VASc score 3(age, htn, vasc).  Clinically asymptomatic, heart rate controlled.  Last EF 50%.  Continue Lopressor 50 mg twice daily, Eliquis 5 mg twice daily. nonobstructive CAD (30% mLAD, 20% mRCA).  Asymptomatic. Continue Eliquis, Lipitor. Hypertension, BP controlled.  Continue lisinopril 20 mg daily, Lopressor 50 mg twice  daily. Current smoker, congratulated on cutting back significantly, continues working towards cessation recommended.  Follow-up in yearly   Medication Adjustments/Labs and Tests Ordered: Current medicines are reviewed at length with the patient today.  Concerns regarding medicines are outlined above.  Orders Placed This Encounter  Procedures   EKG 12-Lead    No orders of the defined types were placed in this encounter.    Patient Instructions  Medication Instructions:  Your physician recommends that you continue on your current medications as directed. Please refer to the Current Medication list given to you today.  *If you need a refill on your cardiac medications before your next appointment, please call your pharmacy*   Lab Work: none If you have labs (blood work) drawn today and your tests are completely normal, you will receive your results only by: MyChart Message (if you have MyChart) OR A paper copy in the mail If you have any lab test that is abnormal or we need to change your treatment, we will call you to review the results.   Testing/Procedures: none   Follow-Up: At [facility], you and your health needs are our priority.  As part of our continuing mission to provide you with exceptional heart care, we have created designated Provider Care Teams.  These Care Teams include your primary Cardiologist (physician) and Advanced Practice Providers (APPs -  Physician Assistants and Nurse Practitioners) who all work together to provide you with the care you need, when you need it.  We recommend signing up for the patient portal called "MyChart".  Sign up information is provided on this After Visit Summary.  MyChart is used to connect with patients for Virtual Visits (Telemedicine).  Patients are able to view lab/test results, encounter notes, upcoming appointments, etc.  Non-urgent messages can be sent to your provider as well.   To learn more about what you can  do with MyChart, go to [email or web address].    Your next appointment:  1 year(s)  Provider:   Debbe Odea, MD    Signed, Debbe Odea, MD  01/11/2023 11:04 AM    [facility]

## 2023-01-11 NOTE — Patient Instructions (Signed)
Medication Instructions:  Your physician recommends that you continue on your current medications as directed. Please refer to the Current Medication list given to you today.  *If you need a refill on your cardiac medications before your next appointment, please call your pharmacy*   Lab Work: none If you have labs (blood work) drawn today and your tests are completely normal, you will receive your results only by: MyChart Message (if you have MyChart) OR A paper copy in the mail If you have any lab test that is abnormal or we need to change your treatment, we will call you to review the results.   Testing/Procedures: none   Follow-Up: At [facility], you and your health needs are our priority.  As part of our continuing mission to provide you with exceptional heart care, we have created designated Provider Care Teams.  These Care Teams include your primary Cardiologist (physician) and Advanced Practice Providers (APPs -  Physician Assistants and Nurse Practitioners) who all work together to provide you with the care you need, when you need it.  We recommend signing up for the patient portal called "MyChart".  Sign up information is provided on this After Visit Summary.  MyChart is used to connect with patients for Virtual Visits (Telemedicine).  Patients are able to view lab/test results, encounter notes, upcoming appointments, etc.  Non-urgent messages can be sent to your provider as well.   To learn more about what you can do with MyChart, go to [email or web address].    Your next appointment:   1 year(s)  Provider:   Debbe Odea, MD

## 2023-01-16 ENCOUNTER — Encounter: Payer: Self-pay | Admitting: Family Medicine

## 2023-01-16 ENCOUNTER — Ambulatory Visit (INDEPENDENT_AMBULATORY_CARE_PROVIDER_SITE_OTHER): Payer: PPO | Admitting: Family Medicine

## 2023-01-16 VITALS — BP 168/90 | HR 72 | Temp 97.5°F | Ht 64.25 in | Wt 198.4 lb

## 2023-01-16 DIAGNOSIS — I10 Essential (primary) hypertension: Secondary | ICD-10-CM

## 2023-01-16 DIAGNOSIS — D539 Nutritional anemia, unspecified: Secondary | ICD-10-CM

## 2023-01-16 DIAGNOSIS — R7303 Prediabetes: Secondary | ICD-10-CM

## 2023-01-16 DIAGNOSIS — Z7189 Other specified counseling: Secondary | ICD-10-CM

## 2023-01-16 DIAGNOSIS — Z0000 Encounter for general adult medical examination without abnormal findings: Secondary | ICD-10-CM

## 2023-01-16 DIAGNOSIS — M5416 Radiculopathy, lumbar region: Secondary | ICD-10-CM

## 2023-01-16 DIAGNOSIS — F1090 Alcohol use, unspecified, uncomplicated: Secondary | ICD-10-CM

## 2023-01-16 DIAGNOSIS — I700 Atherosclerosis of aorta: Secondary | ICD-10-CM

## 2023-01-16 DIAGNOSIS — E559 Vitamin D deficiency, unspecified: Secondary | ICD-10-CM

## 2023-01-16 DIAGNOSIS — M48062 Spinal stenosis, lumbar region with neurogenic claudication: Secondary | ICD-10-CM

## 2023-01-16 DIAGNOSIS — E669 Obesity, unspecified: Secondary | ICD-10-CM

## 2023-01-16 DIAGNOSIS — E7800 Pure hypercholesterolemia, unspecified: Secondary | ICD-10-CM

## 2023-01-16 DIAGNOSIS — M5030 Other cervical disc degeneration, unspecified cervical region: Secondary | ICD-10-CM

## 2023-01-16 DIAGNOSIS — Z720 Tobacco use: Secondary | ICD-10-CM

## 2023-01-16 DIAGNOSIS — J449 Chronic obstructive pulmonary disease, unspecified: Secondary | ICD-10-CM

## 2023-01-16 MED ORDER — ATORVASTATIN CALCIUM 10 MG PO TABS: ORAL_TABLET | ORAL | 4 refills | Status: DC

## 2023-01-16 MED ORDER — LISINOPRIL 20 MG PO TABS: 20.0000 mg | ORAL_TABLET | Freq: Every day | ORAL | 4 refills | Status: DC

## 2023-01-16 MED ORDER — ACETAMINOPHEN ER 650 MG PO TBCR: 650.0000 mg | EXTENDED_RELEASE_TABLET | Freq: Two times a day (BID) | ORAL | Status: AC

## 2023-01-16 NOTE — Assessment & Plan Note (Addendum)
Recent iron levels overall normal through [place], no change in Hgb over the past year of taking oral iron - will stop.  Continue MVI.

## 2023-01-16 NOTE — Assessment & Plan Note (Signed)
Preventative protocols reviewed and updated unless pt declined. Discussed healthy diet and lifestyle.  

## 2023-01-16 NOTE — Patient Instructions (Addendum)
Stop oral iron.  BP staying too high in office today. Start monitoring at home a few times a week, let me know if consistently >140/90.  Watch sodium/ salt levels in diet. Increase fruits/vegetables in diet.  Good to see you today.  Return in 1 year for next physical/wellness visit.

## 2023-01-16 NOTE — Assessment & Plan Note (Signed)
Chronic, stable 

## 2023-01-16 NOTE — Progress Notes (Signed)
Ph: [phone] Fax: [phone]   Patient ID: [name], male    DOB: 02-May-1951, 72 y.o.   MRN: [id number]  This visit was conducted in person.  BP (!) 168/90   Pulse 72   Temp (!) 97.5 F (36.4 C) (Temporal)   Ht 5' 4.25" (1.632 m)   Wt 198 lb 6 oz (90 kg)   SpO2 98%   BMI 33.79 kg/m   BP on repeat 170s/90  CC: AMW/CPE Subjective:   HPI: [name] is a 72 y.o. male presenting on 01/16/2023 for Medicare Wellness   Did not see health advisor this year.   Hearing Screening   500Hz  1000Hz  2000Hz  4000Hz   Right ear 20 20 20  0  Left ear 20 20 20  0  Vision Screening - Comments:: Last eye exam, 09/2022.  Flowsheet Row Clinical Support from 12/29/2020 in [facility] [place]  PHQ-2 Total Score 0          01/12/2022    9:45 AM 12/29/2020   10:28 AM 10/02/2019   11:15 AM 09/12/2018   12:18 PM 09/11/2017    9:36 AM  Fall Risk   Falls in the past year? 0 0 0 0 No  Number falls in past yr:  0 0    Injury with Fall?  0 0    Risk for fall due to :  Medication side effect Medication side effect    Follow up  Falls evaluation completed;Falls prevention discussed Falls prevention discussed;Falls evaluation completed     [facility]   Persistent afib, failed DCCV 04/2020, continues eliquis and metoprolol bid. 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    Multiple back procedures including posterior cervical disc fusion C1-2-3 04/2020 as well as bilateral L2/3 laminectomy/foraminotomy 06/2020 Lovell Sheehan) followed by C6-T3 posterior spinal fusion ([facility]) 09/2021.    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   COPD - gets inhalers through the [place].     Preventative: Colonoscopy 09/23/2010 - small HPs, diverticula, hemorrhoids. Rpt 10 yrs Elnoria Howard).  Colonoscopy 12/2020 - SSAx2, diverticulosis, rpt 7 yrs Elnoria Howard)  Prostate - normal DRE/PSA in past. h/o BPH, s/p UroLift 10/2020 Apolinar Junes) with significant improvement in obstructive symptoms.  Lung cancer screening - yearly screening CT since 2018, latest 07/2022.  AAA screen - poor study, no AAA noted (09/2016).  Flu shot yearly  Pfizer COVID 08/2019, 09/2019, Moderna booster 10/2020  Pneumovax - 2015, 2020, prevnar-13 2018, 2019 Tdap 2014 RSV 09/2022 zostavax - 09/2013 Shingrix - 03/2022, 05/2022 Advanced directives: has at home. HCPOA is daughter, Aggie Cosier. He and wife decline bringing copy of living will for our records.  Seat belt use discussed. Sunscreen use discussed. No changing moles on skin.  Smoking 3-4 cig/wk. 1 pack lasts a month Alcohol - 4-5 beers/day  Dentist q6 mo  Eye exam yearly - through the [place]  Bowel - no diarrhea/constipation  Bladder - no incontinence   Lives with wife. Has 1 son, 2 daughters.   Occupation: Event organiser. Activity: no regular exercise  Diet: gatorade, no water, fruits/vegetables daily      Relevant past medical, surgical, family and social history reviewed and updated as indicated. Interim medical history since our last visit reviewed. Allergies and medications reviewed and updated. Outpatient  Medications Prior to Visit  Medication Sig Dispense Refill   Albuterol Sulfate (PROAIR RESPICLICK) 108 (90 Base) MCG/ACT AEPB Inhale 2 puffs into the lungs every 6 (six) hours as needed (cough, shortness of breath). 1 each 3   apixaban (ELIQUIS) 5 MG TABS tablet Take 5 mg by mouth 2 (two) times daily.     Cholecalciferol 25 MCG (1000 UT) tablet Take 1,000 Units by mouth daily.     fluticasone-salmeterol (WIXELA INHUB) 250-50 MCG/ACT AEPB Inhale 1 puff into the lungs daily.     loratadine (CLARITIN) 10 MG tablet Take 10 mg by mouth as needed.     metoprolol  tartrate (LOPRESSOR) 50 MG tablet Take 1 tablet (50 mg total) by mouth 2 (two) times daily. 60 tablet 3   OVER THE COUNTER MEDICATION Take 2 tablets by mouth daily. sulfurzyme     sildenafil (VIAGRA) 50 MG tablet Take 50 mg by mouth daily as needed for erectile dysfunction. Taking 1/2 tablet PRN (from [facility])     atorvastatin (LIPITOR) 10 MG tablet TAKE 1 TABLET(10 MG) BY MOUTH DAILY 90 tablet 3   ferrous sulfate 325 (65 FE) MG EC tablet Take 325 mg by mouth daily with breakfast.     lisinopril (ZESTRIL) 20 MG tablet Take 1 tablet (20 mg total) by mouth daily. 90 tablet 3   OVER THE COUNTER MEDICATION Take 1,500 mg by mouth daily. BLM Capsules - [facility] Essential Oils 750 mg each     No facility-administered medications prior to visit.     Per HPI unless specifically indicated in ROS section below Review of Systems  Constitutional:  Negative for activity change, appetite change, chills, fatigue, fever and unexpected weight change.  HENT:  Negative for hearing loss.   Eyes:  Negative for visual disturbance.  Respiratory:  Positive for cough (occ), shortness of breath (COPD) and wheezing (occ). Negative for chest tightness.   Cardiovascular:  Negative for chest pain, palpitations and leg swelling.  Gastrointestinal:  Positive for constipation. Negative for abdominal distention, abdominal pain, blood in stool, diarrhea, nausea and vomiting.  Genitourinary:  Negative for difficulty urinating and hematuria.  Musculoskeletal:  Positive for arthralgias, back pain and myalgias. Negative for neck pain.  Skin:  Negative for rash.  Neurological:  Positive for headaches. Negative for dizziness, seizures and syncope.  Hematological:  Negative for adenopathy. Does not bruise/bleed easily.    Objective:  BP (!) 168/90   Pulse 72   Temp (!) 97.5 F (36.4 C) (Temporal)   Ht 5' 4.25" (1.632 m)   Wt 198 lb 6 oz (90 kg)   SpO2 98%   BMI 33.79 kg/m   Wt Readings from Last 3 Encounters:   01/16/23 198 lb 6 oz (90 kg)  01/11/23 198 lb 6.4 oz (90 kg)  01/12/22 185 lb 6 oz (84.1 kg)      Physical Exam Vitals and nursing note reviewed.  Constitutional:      General: He is not in acute distress.    Appearance: Normal appearance. He is well-developed. He is not ill-appearing.  HENT:     Head: Normocephalic and atraumatic.     Right Ear: Hearing, tympanic membrane, ear canal and external ear normal.     Left Ear: Hearing, tympanic membrane, ear canal and external ear normal.     Mouth/Throat:     Mouth: Mucous membranes are moist.     Pharynx: Oropharynx is clear. No oropharyngeal exudate or posterior oropharyngeal erythema.  Eyes:  General: No scleral icterus.    Extraocular Movements: Extraocular movements intact.     Conjunctiva/sclera: Conjunctivae normal.     Pupils: Pupils are equal, round, and reactive to light.  Neck:     Thyroid: No thyroid mass or thyromegaly.     Vascular: No carotid bruit.  Cardiovascular:     Rate and Rhythm: Normal rate and regular rhythm.     Pulses: Normal pulses.          Radial pulses are 2+ on the right side and 2+ on the left side.     Heart sounds: Normal heart sounds. No murmur heard. Pulmonary:     Effort: Pulmonary effort is normal. No respiratory distress.     Breath sounds: Normal breath sounds. No wheezing, rhonchi or rales.  Abdominal:     General: Bowel sounds are normal. There is no distension.     Palpations: Abdomen is soft. There is no mass.     Tenderness: There is no abdominal tenderness. There is no guarding or rebound.     Hernia: No hernia is present.  Musculoskeletal:        General: Normal range of motion.     Cervical back: Normal range of motion and neck supple.     Right lower leg: No edema.     Left lower leg: No edema.  Lymphadenopathy:     Cervical: No cervical adenopathy.  Skin:    General: Skin is warm and dry.     Findings: No rash.  Neurological:     General: No focal deficit present.      Mental Status: He is alert and oriented to person, place, and time.     Comments: Declines memory testing  Psychiatric:        Mood and Affect: Mood normal.        Behavior: Behavior normal.        Thought Content: Thought content normal.        Judgment: Judgment normal.       Results for orders placed or performed in visit on 01/09/23  CBC with Differential/Platelet  Result Value Ref Range   WBC 5.1 4.0 - 10.5 K/uL   RBC 3.63 (L) 4.22 - 5.81 Mil/uL   Hemoglobin 12.5 (L) 13.0 - 17.0 g/dL   HCT 16.1 (L) 09.6 - 04.5 %   MCV 102.3 (H) 78.0 - 100.0 fl   MCHC 33.7 30.0 - 36.0 g/dL   RDW 40.9 81.1 - 91.4 %   Platelets 208.0 150.0 - 400.0 K/uL   Neutrophils Relative % 44.3 43.0 - 77.0 %   Lymphocytes Relative 41.3 12.0 - 46.0 %   Monocytes Relative 9.6 3.0 - 12.0 %   Eosinophils Relative 4.3 0.0 - 5.0 %   Basophils Relative 0.5 0.0 - 3.0 %   Neutro Abs 2.2 1.4 - 7.7 K/uL   Lymphs Abs 2.1 0.7 - 4.0 K/uL   Monocytes Absolute 0.5 0.1 - 1.0 K/uL   Eosinophils Absolute 0.2 0.0 - 0.7 K/uL   Basophils Absolute 0.0 0.0 - 0.1 K/uL  PSA  Result Value Ref Range   PSA 2.38 0.10 - 4.00 ng/mL  VITAMIN D 25 Hydroxy (Vit-D Deficiency, Fractures)  Result Value Ref Range   VITD 49.87 30.00 - 100.00 ng/mL  Vitamin B12  Result Value Ref Range   Vitamin B-12 567 211 - 911 pg/mL  Hemoglobin A1c  Result Value Ref Range   Hgb A1c MFr Bld 5.8 4.6 - 6.5 %  Comprehensive  metabolic panel  Result Value Ref Range   Sodium 131 (L) 135 - 145 mEq/L   Potassium 4.7 3.5 - 5.1 mEq/L   Chloride 96 96 - 112 mEq/L   CO2 28 19 - 32 mEq/L   Glucose, Bld 121 (H) 70 - 99 mg/dL   BUN 8 6 - 23 mg/dL   Creatinine, Ser 1.61 0.40 - 1.50 mg/dL   Total Bilirubin 1.3 (H) 0.2 - 1.2 mg/dL   Alkaline Phosphatase 80 39 - 117 U/L   AST 23 0 - 37 U/L   ALT 27 0 - 53 U/L   Total Protein 6.6 6.0 - 8.3 g/dL   Albumin 4.4 3.5 - 5.2 g/dL   GFR 09.60 >45.40 mL/min   Calcium 9.6 8.4 - 10.5 mg/dL  Lipid panel  Result Value  Ref Range   Cholesterol 152 0 - 200 mg/dL   Triglycerides 98.1 0.0 - 149.0 mg/dL   HDL 19.14 >78.29 mg/dL   VLDL 7.8 0.0 - 56.2 mg/dL   LDL Cholesterol 58 0 - 99 mg/dL   Total CHOL/HDL Ratio 2    NonHDL 65.95     Assessment & Plan:   Problem List Items Addressed This Visit     Medicare annual wellness visit, subsequent - Primary (Chronic)    I have personally reviewed the Medicare Annual Wellness questionnaire and have noted 1. The patient's medical and social history 2. Their use of alcohol, tobacco or illicit drugs 3. Their current medications and supplements 4. The patient's functional ability including ADL's, fall risks, home safety risks and hearing or visual impairment. Cognitive function has been assessed and addressed as indicated.  5. Diet and physical activity 6. Evidence for depression or mood disorders The patients weight, height, BMI have been recorded in the chart. I have made referrals, counseling and provided education to the patient based on review of the above and I have provided the pt with a written personalized care plan for preventive services. Provider list updated.. See scanned questionairre as needed for further documentation. Reviewed preventative protocols and updated unless pt declined.       Advanced care planning/counseling discussion (Chronic)    Previously discussed.  HCPOA is daughter Crystal. We don't have living will per pt preference.       Health maintenance examination (Chronic)    Preventative protocols reviewed and updated unless pt declined. Discussed healthy diet and lifestyle.       HLD (hyperlipidemia)    Chronic, good control on low dose atorvastatin - continue. The 10-year ASCVD risk score (Arnett DK, et al., 2019) is: 47.9%   Values used to calculate the score:     Age: 53 years     Sex: Male     Is Non-Hispanic African American: No     Diabetic: Yes     Tobacco smoker: Yes     Systolic Blood Pressure: 168 mmHg     Is BP  treated: Yes     HDL Cholesterol: 85.9 mg/dL     Total Cholesterol: 152 mg/dL       Relevant Medications   atorvastatin (LIPITOR) 10 MG tablet   lisinopril (ZESTRIL) 20 MG tablet   Essential hypertension    Chronic, deteriorated. BP markedly elevated despite current regimen. Pt attributes to stressful morning.  Agrees to monitor BP at home, let me know if consistently above goal to titrate antihypertensives accordingly.       Relevant Medications   atorvastatin (LIPITOR) 10 MG tablet   lisinopril (ZESTRIL)  20 MG tablet   COPD (chronic obstructive pulmonary disease) (HCC)    Chronic, continued smoker. Continue Wixela controller inhaler daily + albuterol rescue inhaler prn      DEGENERATIVE DISC DISEASE, CERVICAL SPINE   Relevant Medications   acetaminophen (TYLENOL 8 HOUR) 650 MG CR tablet   Prediabetes    Chronic, stable.       Vitamin D deficiency    Continue vit D 1000 international units  daily.       Obesity, Class I, BMI 30-34.9    Continue to encourage healthy diet choices to affect sustainable weight loss. Activity limited by joint and back pains.       Habitual alcohol use    Encouraged limiting alcohol use.  Discussed how macrocytic anemia and hyponatremia could be coming from alcoholic liver disease.       Tobacco use    Continues smoking intermittently. Precontemplative. Continues lung cancer screening CT.       Aortic atherosclerosis (HCC)    Continue statin, eliquis.       Relevant Medications   atorvastatin (LIPITOR) 10 MG tablet   lisinopril (ZESTRIL) 20 MG tablet   Lumbar radiculopathy, chronic   Macrocytic anemia    Recent iron levels overall normal through [place], no change in Hgb over the past year of taking oral iron - will stop.  Continue MVI.       Spinal stenosis of lumbar region with neurogenic claudication   Relevant Medications   acetaminophen (TYLENOL 8 HOUR) 650 MG CR tablet     Meds ordered this encounter  Medications    DISCONTD: atorvastatin (LIPITOR) 10 MG tablet    Sig: TAKE 1 TABLET(10 MG) BY MOUTH DAILY    Dispense:  90 tablet    Refill:  4   DISCONTD: lisinopril (ZESTRIL) 20 MG tablet    Sig: Take 1 tablet (20 mg total) by mouth daily.    Dispense:  90 tablet    Refill:  4   acetaminophen (TYLENOL 8 HOUR) 650 MG CR tablet    Sig: Take 1 tablet (650 mg total) by mouth in the morning and at bedtime.   atorvastatin (LIPITOR) 10 MG tablet    Sig: TAKE 1 TABLET(10 MG) BY MOUTH DAILY    Dispense:  90 tablet    Refill:  4   lisinopril (ZESTRIL) 20 MG tablet    Sig: Take 1 tablet (20 mg total) by mouth daily.    Dispense:  90 tablet    Refill:  4    No orders of the defined types were placed in this encounter.   Patient Instructions  Stop oral iron.  BP staying too high in office today. Start monitoring at home a few times a week, let me know if consistently >140/90.  Watch sodium/ salt levels in diet. Increase fruits/vegetables in diet.  Good to see you today.  Return in 1 year for next physical/wellness visit.   Follow up plan: Return in about 1 year (around 01/16/2024) for annual exam, prior fasting for blood work, medicare wellness visit.  Eustaquio Boyden, MD

## 2023-01-16 NOTE — Assessment & Plan Note (Addendum)
Chronic, good control on low dose atorvastatin - continue. The 10-year ASCVD risk score (Arnett DK, et al., 2019) is: 47.9%   Values used to calculate the score:     Age: 72 years     Sex: Male     Is Non-Hispanic African American: No     Diabetic: Yes     Tobacco smoker: Yes     Systolic Blood Pressure: 168 mmHg     Is BP treated: Yes     HDL Cholesterol: 85.9 mg/dL     Total Cholesterol: 152 mg/dL

## 2023-01-16 NOTE — Assessment & Plan Note (Signed)
Chronic, continued smoker. Continue Wixela controller inhaler daily + albuterol rescue inhaler prn

## 2023-01-16 NOTE — Assessment & Plan Note (Addendum)
Continue to encourage healthy diet choices to affect sustainable weight loss. Activity limited by joint and back pains.

## 2023-01-16 NOTE — Assessment & Plan Note (Signed)
Continue statin, eliquis.  

## 2023-01-16 NOTE — Assessment & Plan Note (Addendum)
Continues smoking intermittently. Precontemplative. Continues lung cancer screening CT.

## 2023-01-16 NOTE — Assessment & Plan Note (Signed)
Continue vit D 1000 international units  daily.

## 2023-01-16 NOTE — Assessment & Plan Note (Signed)

## 2023-01-16 NOTE — Assessment & Plan Note (Signed)
Encouraged limiting alcohol use.  Discussed how macrocytic anemia and hyponatremia could be coming from alcoholic liver disease.

## 2023-01-16 NOTE — Assessment & Plan Note (Signed)
Previously discussed.  HCPOA is daughter [name]. We don't have living will per pt preference.

## 2023-01-16 NOTE — Assessment & Plan Note (Signed)
Chronic, deteriorated. BP markedly elevated despite current regimen. Pt attributes to stressful morning.  Agrees to monitor BP at home, let me know if consistently above goal to titrate antihypertensives accordingly.

## 2023-07-17 ENCOUNTER — Other Ambulatory Visit (INDEPENDENT_AMBULATORY_CARE_PROVIDER_SITE_OTHER): Payer: Self-pay

## 2023-07-17 ENCOUNTER — Encounter: Payer: Self-pay | Admitting: Physician Assistant

## 2023-07-17 ENCOUNTER — Ambulatory Visit: Payer: PPO | Admitting: Physician Assistant

## 2023-07-17 DIAGNOSIS — M7051 Other bursitis of knee, right knee: Secondary | ICD-10-CM

## 2023-07-17 DIAGNOSIS — M7052 Other bursitis of knee, left knee: Secondary | ICD-10-CM

## 2023-07-17 DIAGNOSIS — M25562 Pain in left knee: Secondary | ICD-10-CM

## 2023-07-17 DIAGNOSIS — G8929 Other chronic pain: Secondary | ICD-10-CM | POA: Diagnosis not present

## 2023-07-17 DIAGNOSIS — M7050 Other bursitis of knee, unspecified knee: Secondary | ICD-10-CM

## 2023-07-17 MED ORDER — LIDOCAINE HCL 1 % IJ SOLN
0.5000 mL | INTRAMUSCULAR | Status: AC | PRN
  Administered 2023-07-17: .5 mL

## 2023-07-17 MED ORDER — METHYLPREDNISOLONE ACETATE 40 MG/ML IJ SUSP
20.0000 mg | INTRAMUSCULAR | Status: AC | PRN
  Administered 2023-07-17: 20 mg via INTRAMUSCULAR

## 2023-07-17 MED ORDER — METHYLPREDNISOLONE ACETATE 40 MG/ML IJ SUSP
40.0000 mg | INTRAMUSCULAR | Status: AC | PRN
  Administered 2023-07-17: 40 mg via INTRA_ARTICULAR

## 2023-07-17 MED ORDER — LIDOCAINE HCL 1 % IJ SOLN
3.0000 mL | INTRAMUSCULAR | Status: AC | PRN
  Administered 2023-07-17: 3 mL

## 2023-07-17 NOTE — Progress Notes (Signed)
Office Visit Note   Patient: [name]           Date of Birth: Sep 28, 1950           MRN: [id number] Visit Date: 07/17/2023              Requested by: Pedro Boyden, MD [address] [place],  [place] [address] PCP: Pedro Boyden, MD   Assessment & Plan: Visit Diagnoses:  1. Pes anserine bursitis   2. Chronic pain of left knee     Plan:  He will work on Dance movement psychotherapist.  Would like to see him back in just 2 weeks see what type of response he had to the injections.  Questions were encouraged and answered.    Follow-Up Instructions: Return in about 2 weeks (around 07/31/2023).   Orders:  Orders Placed This Encounter  Procedures   Large Joint Inj: L knee   Trigger Point Inj   XR Knee 1-2 Views Right   XR Knee 1-2 Views Left   No orders of the defined types were placed in this encounter.     Procedures: Large Joint Inj: L knee on 07/17/2023 3:32 PM Indications: pain Details: 22 G 1.5 in needle, anterolateral approach  Arthrogram: No  Medications: 3 mL lidocaine 1 %; 40 mg methylPREDNISolone acetate 40 MG/ML Outcome: tolerated well, no immediate complications Procedure, treatment alternatives, risks and benefits explained, specific risks discussed. Consent was given by the patient. Immediately prior to procedure a time out was called to verify the correct patient, procedure, equipment, support staff and site/side marked as required. Patient was prepped and draped in the usual sterile fashion.    Trigger Point Inj  Date/Time: 07/17/2023 3:33 PM  Performed by: Kirtland Bouchard, PA-C Authorized by: Kirtland Bouchard, PA-C   Indications:  Pain and therapeutic Total # of Trigger Points:  1 Location: lower extremity   Approach:  Medial Medications #1:  0.5 mL lidocaine 1 %; 20 mg methylPREDNISolone acetate 40 MG/ML    Clinical Data: No additional findings.   Subjective: Chief Complaint  Patient presents with   Right Knee - Pain   Left  Knee - Pain    HPI [name] 72 year old male well-known to Dr. Raye [name] service comes in today with bilateral knee pain.  States that knee pain has been ongoing for the past year.  He had a right total knee arthroplasty performed by Dr. Magnus [name] approximately 9 years ago.  No new injury to either knee.  Takes Tylenol for the pain.  Patient is nondiabetic  Review of Systems Denies any fevers chills.  Objective: Vital Signs: There were no vitals taken for this visit.  Physical Exam Constitutional:      Appearance: He is not ill-appearing or diaphoretic.  Pulmonary:     Effort: Pulmonary effort is normal.  Neurological:     Mental Status: He is alert and oriented to person, place, and time.  Psychiatric:        Mood and Affect: Mood normal.     Ortho Exam Bilateral knees good range of motion of both knees no instability valgus varus stressing of either knee anterior drawer is negative bilaterally.  No abnormal warmth erythema or effusion of either knee.  Tenderness right knee along the pes anserinus region.  Right knee surgical incision is well-healed.  Left knee tenderness mainly over the medial joint line.  Specialty Comments:  No specialty comments available.  Imaging: XR Knee 1-2 Views Left Result Date:  07/17/2023 Left knee: 2 views knee is well located.  Tricompartmental arthritis with moderate patellofemoral and mild to moderate medial compartmental arthritic changes.  Very mild lateral compartmental changes.  No acute fractures or acute findings otherwise.  XR Knee 1-2 Views Right Result Date: 07/17/2023 Right knee 2 views: Status post right total knee arthroplasty well-seated components.  No acute fractures or acute findings.  Knee is well located.    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 [facility]) 05/14/2020   Spinal stenosis of lumbar region with neurogenic claudication 04/21/2020   Incomplete emptying of bladder 04/08/2020   Pre-op evaluation 03/27/2020   Persistent atrial fibrillation (HCC) 03/27/2020   Macrocytic anemia 06/20/2019   Lumbar radiculopathy, chronic 04/24/2019   Kidney cyst, acquired 12/12/2017   Aortic atherosclerosis (HCC) 09/15/2016   CAD (coronary artery disease) 09/15/2016   Tinea pedis of both feet 03/07/2016   Tobacco use 09/30/2015   Advanced care planning/counseling discussion 09/03/2014   Primary osteoarthritis of right knee 06/27/2014   Status post total right knee replacement 06/27/2014   Habitual alcohol use 12/08/2013   Medicare annual wellness visit, subsequent 08/25/2011   Vitamin D deficiency 08/26/2010   Obesity, Class I, BMI 30-34.9 08/26/2010   BPH (benign prostatic hyperplasia) 08/26/2010   Onychomycosis 02/08/2010   PREMATURE VENTRICULAR CONTRACTIONS 11/17/2009   PVD 11/06/2008   Snoring 11/06/2008   HLD (hyperlipidemia) 07/31/2007   Essential hypertension 07/31/2007   Allergic rhinitis 07/31/2007   COPD (chronic obstructive pulmonary disease) (HCC) 07/31/2007   DEGENERATIVE DISC DISEASE, CERVICAL SPINE 07/31/2007   Prediabetes 07/31/2007   History of hepatitis C 07/31/2007   Past Medical History:  Diagnosis Date   Allergic rhinitis 07/1998   Aortic atherosclerosis (HCC) 09/17/2016   Arthritis    BACK AND RIGHT KNEE   CAD (coronary artery disease) 07/01/2015   a.) LHC 07/01/2015: normal LV function; 30% p-mLAD, 20% p-mRCA; intervention deferred opting for medical mgmt.   COPD (chronic obstructive pulmonary disease) (HCC) 07/1998   centrilobular emphysema (09/2016) w ongoing tobacco use, spirometry (02/2013)   DDD (degenerative disc disease), cervical    DOE (dyspnea on exertion)    Erectile dysfunction    a.) on PDE5i (sildenafil)   ETOH abuse    a.) consumes 4-5 beers on a  daily basis   History of exposure to asbestos    HTN (hypertension) 05/1998   Hyperlipidemia 06/1996   Hypertensive retinopathy    Long term current use of anticoagulant    a.) apixaban   Lumbar spinal stenosis 03/2013   a.) s/p laminectomy   Macrocytic anemia    Migraines    Peripheral vascular disease (HCC)    Persistent atrial fibrillation (HCC)    a.) CHA2DS2-VASc = 3 (age, HTN, aortic plaque). 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   Smoker     Family History  Problem Relation Age of Onset   Coronary artery disease Mother    Hypertension Mother    Stroke Mother    Coronary artery disease Father    Hypertension Father    Diabetes  Father    Stroke Father    Pneumonia Sister    Irregular heart beat Brother    Maple syrup urine disease Brother    Cancer Neg Hx     Past Surgical History:  Procedure Laterality Date   ANTERIOR CERVICAL DECOMP/DISCECTOMY FUSION  2003   C3-7   APPLICATION OF INTRAOPERATIVE CT SCAN N/A 10/11/2021   Procedure: APPLICATION OF INTRAOPERATIVE CT SCAN;  Surgeon: Venetia Night, MD;  Location: [facility] ORS;  Service: Neurosurgery;  Laterality: N/A;   BACK SURGERY  2007   CARDIAC CATHETERIZATION N/A 07/01/2015   Procedure: Left Heart Cath and Coronary Angiography;  Surgeon: Iran Ouch, MD;  Location: [facility];  Service: Cardiovascular;  Laterality: N/A;   CARDIOVERSION N/A 05/06/2020   Procedure: CARDIOVERSION;  Surgeon: Debbe Odea, MD;  Location: [facility];  Service: Cardiovascular;  Laterality: N/A;   COLONOSCOPY  09/2010   HPx2, diverticula, hemorrhoids, rpt 10 yrs Elnoria Howard)   COLONOSCOPY  01/07/2021   SSAx2, diverticulosis, rpt 7 yrs ([place])   CYSTOSCOPY WITH INSERTION OF UROLIFT N/A  11/02/2020   Procedure: CYSTOSCOPY WITH INSERTION OF UROLIFT;  Surgeon: Vanna Scotland, MD;  Location: [facility];  Service: Urology;  Laterality: N/A;   ETT  2010   normal, ABIs normal   HIP SURGERY Right 08/2022   R lateral femoral cutaneous nerve decompression/transposition   KNEE ARTHROSCOPY Right 2011   cartlage, ligaments   KNEE ARTHROSCOPY Left 1984   LUMBAR LAMINECTOMY  06/2020   Bilateral L2-3 laminectomy Lovell Sheehan)   LUMBAR LAMINECTOMY/DECOMPRESSION MICRODISCECTOMY Left 03/27/2013   Procedure: LUMBAR LAMINECTOMY/DECOMPRESSION MICRODISCECTOMY 2 LEVELS;  Surgeon: Cristi Loron, MD;  Location: [facility];  Service: Neurosurgery;  Laterality: Left;  LEFT Lumbar Three Four diskectomy with Lumbar Three-Four, Four-Five Laminectomy. L3/4 ahd L4/5 for LSS Lovell Sheehan)    LUMBAR LAMINECTOMY/DECOMPRESSION MICRODISCECTOMY Bilateral 07/15/2020   Procedure: LAMINECTOMY AND FORAMINOTOMY BILATERAL LUMBAR TWO-LUMBAR THREE;  Surgeon: Tressie Stalker, MD;  Location: [facility] OR;  Service: Neurosurgery;  Laterality: Bilateral;   POSTERIOR CERVICAL FUSION/FORAMINOTOMY N/A 05/14/2020   Procedure: LAMINECTOMY, POSTERIOR INSTRUMENTATION AND FUSION, CERVICAL ONE- CERVICAL TWO, CERVICAL TWO- CERVICAL THREE;  Surgeon: Tressie Stalker, MD;  Location: [facility] OR;  Service: Neurosurgery;  Laterality: N/A;   POSTERIOR CERVICAL FUSION/FORAMINOTOMY N/A 10/11/2021   Procedure: C6-T3 POSTERIOR FUSION;  Surgeon: Venetia Night, MD;  Location: [facility] OR[facility];  Service: Neurosurgery;  Laterality: N/A;   POSTERIOR CERVICAL LAMINECTOMY N/A 10/11/2021   Procedure: C6-T2 POSTERIOR SPINAL DECOMPRESSION;  Surgeon: Venetia Night, MD;  Location: [facility] OR[facility];  Service: Neurosurgery;  Laterality: N/A;   SHOULDER ARTHROSCOPY Right 2011   spriometry  02/2013   COPD with some restriction thought to be obesity related ([place])   TOTAL KNEE ARTHROPLASTY Right 06/27/2014   Procedure: RIGHT TOTAL KNEE ARTHROPLASTY;  Surgeon: Kathryne Hitch, MD;  Location: [facility];  Service: Orthopedics;  Laterality: Right;   US ECHOCARDIOGRAPHY  01/2013   WNL, mild diastolic dysfunction   Social History   Occupational History   Not on file  Tobacco Use   Smoking status: Some Days    Current packs/day: 0.50    Average packs/day: 0.5 packs/day for 40.0 years (20.0 ttl pk-yrs)    Types: Cigarettes   Smokeless tobacco: Never   Tobacco comments:    3 cigarattes over the past 3 weeks as of 10-06-21  Vaping Use   Vaping status: Never Used  Substance and Sexual Activity   Alcohol use: Yes    Alcohol/week: 28.0 standard drinks of alcohol  Types: 28 Cans of beer per week    Comment: 4-5 beers daily   Drug use: No   Sexual activity: Not Currently

## 2023-08-14 ENCOUNTER — Ambulatory Visit: Payer: PPO | Admitting: Physician Assistant

## 2023-08-18 ENCOUNTER — Ambulatory Visit
Admission: RE | Admit: 2023-08-18 | Discharge: 2023-08-18 | Disposition: A | Discharge status: Home / Self Care | Payer: PPO | Source: Ambulatory Visit | Attending: Acute Care | Admitting: Acute Care

## 2023-08-18 DIAGNOSIS — Z87891 Personal history of nicotine dependence: Secondary | ICD-10-CM | POA: Insufficient documentation

## 2023-08-18 DIAGNOSIS — F17210 Nicotine dependence, cigarettes, uncomplicated: Secondary | ICD-10-CM | POA: Diagnosis present

## 2023-09-01 ENCOUNTER — Telehealth: Payer: Self-pay | Admitting: Acute Care

## 2023-09-01 DIAGNOSIS — R911 Solitary pulmonary nodule: Secondary | ICD-10-CM

## 2023-09-01 NOTE — Telephone Encounter (Signed)
Called and left VM for pt. Per Kandice Robinsons NP, will need to see if pt has been sick, if so he will need to see his PCP for treatment and do 3 month follow up scan. If pt is not currently sick then OK to order 3 month follow up scan to re-assess.    IMPRESSION: 1. Today's study demonstrates new nodular areas of architectural distortion, with an overall appearance which favors an acute infectious or inflammatory etiology limiting today's study resulting categorization as Lung-RADS 0S, incomplete. Short-term follow-up in 3 months is recommended with repeat low-dose chest CT without contrast (please use the following order, "CT CHEST LCS NODULE FOLLOW-UP W/O CM"). 2. The "S" modifier above refers to potentially clinically significant non lung cancer related findings. Specifically, there is aortic atherosclerosis, in addition to left main and 2 vessel coronary artery disease. Please note that although the presence of coronary artery calcium documents the presence of coronary artery disease, the severity of this disease and any potential stenosis cannot be assessed on this non-gated CT examination. Assessment for potential risk factor modification, dietary therapy or pharmacologic therapy may be warranted, if clinically indicated. 3. Mild diffuse bronchial wall thickening with mild centrilobular and paraseptal emphysema; imaging findings suggestive of underlying COPD.   Aortic Atherosclerosis (ICD10-I70.0) and Emphysema (ICD10-J43.9).     Electronically Signed   By: Trudie Reed M.D.   On: 08/29/2023 11:40

## 2023-09-04 NOTE — Telephone Encounter (Signed)
 Called and spoke to patient. Informed him of the results and recommendations per Kandice Robinsons NP. Patient verbalized understanding. He states he is not currently sick nor does he remember being sick at the time of his scan. However, patient states he occasionally has COPD flares with chest congestion. Patient thinks the results of scan could've reflected an exacerbation. 3 month follow up scan ordered. Results and plan sent to PCP.

## 2023-09-14 ENCOUNTER — Ambulatory Visit: Payer: PPO | Admitting: Physician Assistant

## 2023-09-22 LAB — LAB REPORT - SCANNED
A1c: 5.8
Creatinine, POC: 45.7 mg/dL
EGFR: 82
Microalb Creat Ratio: 17.5
Microalbumin, Urine: 0.8

## 2023-11-15 ENCOUNTER — Ambulatory Visit
Admission: RE | Admit: 2023-11-15 | Discharge: 2023-11-15 | Disposition: A | Discharge status: Home / Self Care | Source: Ambulatory Visit | Attending: Acute Care | Admitting: Acute Care

## 2023-11-15 DIAGNOSIS — R911 Solitary pulmonary nodule: Secondary | ICD-10-CM | POA: Diagnosis present

## 2023-12-05 ENCOUNTER — Telehealth: Payer: Self-pay | Admitting: Family Medicine

## 2023-12-05 NOTE — Telephone Encounter (Signed)
 Sorry, no results yet, and should be advised by the ordering provider (neither of which is myself or PCP)

## 2023-12-05 NOTE — Telephone Encounter (Signed)
 Patient is requesting a call back with CT lung results.   Thanks [facility] Guide [facility] AWV TEAM Direct Dial: [phone]

## 2023-12-06 NOTE — Telephone Encounter (Addendum)
 CT was completed on 4/30. Can we call radiology department to get update on read?  Thanks,  Jody Mura

## 2023-12-12 ENCOUNTER — Other Ambulatory Visit: Payer: Self-pay | Admitting: Acute Care

## 2023-12-12 DIAGNOSIS — Z87891 Personal history of nicotine dependence: Secondary | ICD-10-CM

## 2023-12-12 DIAGNOSIS — F17210 Nicotine dependence, cigarettes, uncomplicated: Secondary | ICD-10-CM

## 2023-12-12 DIAGNOSIS — Z122 Encounter for screening for malignant neoplasm of respiratory organs: Secondary | ICD-10-CM

## 2023-12-12 NOTE — Telephone Encounter (Signed)
Spoke with pt relaying results and Dr. Synthia Innocent message. Pt verbalizes understanding and expresses his thanks for the call.

## 2023-12-12 NOTE — Telephone Encounter (Signed)
 Plz notify lung cancer screening CT overall reassuring - showing emphysema changes, plaque buildup in arteries of chest, and 3.2cm R kidney cyst (no f/u recommended). No new lung nodules or mass. Previous nodular areas are resolved.

## 2023-12-18 ENCOUNTER — Encounter: Payer: Self-pay | Admitting: Physician Assistant

## 2023-12-18 ENCOUNTER — Ambulatory Visit: Admitting: Physician Assistant

## 2023-12-18 ENCOUNTER — Telehealth: Payer: Self-pay | Admitting: Radiology

## 2023-12-18 DIAGNOSIS — G8929 Other chronic pain: Secondary | ICD-10-CM

## 2023-12-18 DIAGNOSIS — M7051 Other bursitis of knee, right knee: Secondary | ICD-10-CM | POA: Diagnosis not present

## 2023-12-18 DIAGNOSIS — M25562 Pain in left knee: Secondary | ICD-10-CM

## 2023-12-18 MED ORDER — METHYLPREDNISOLONE ACETATE 40 MG/ML IJ SUSP
40.0000 mg | INTRAMUSCULAR | Status: AC | PRN
  Administered 2023-12-18: 40 mg via INTRA_ARTICULAR

## 2023-12-18 MED ORDER — LIDOCAINE HCL 1 % IJ SOLN
3.0000 mL | INTRAMUSCULAR | Status: AC | PRN
  Administered 2023-12-18: 3 mL

## 2023-12-18 MED ORDER — LIDOCAINE HCL 1 % IJ SOLN
1.0000 mL | INTRAMUSCULAR | Status: AC | PRN
  Administered 2023-12-18: 1 mL

## 2023-12-18 MED ORDER — METHYLPREDNISOLONE ACETATE 40 MG/ML IJ SUSP
20.0000 mg | INTRAMUSCULAR | Status: AC | PRN
  Administered 2023-12-18: 20 mg via INTRAMUSCULAR

## 2023-12-18 NOTE — Progress Notes (Signed)
   Procedure Note  Patient: [name]             Date of Birth: [birth year]           MRN: [id number]             Visit Date: 12/18/2023 HPI: [name] comes in today for bilateral knee pain asking for injection both knees.  History of right total knee arthroplasty.  We last saw him 07/17/2023 and was given a left knee injection and a right knee pes anserinus injection.  He states that both knees began to bother him after about 4 months of good relief.  He has had no recent falls or injuries. Radiographs of his left knee dated 07/17/2023 are reviewed and shows tricompartmental arthritis with moderate patellofemoral arthritic changes and mild to moderate medial compartmental arthritic changes.  Denies any fevers or chills.  Nondiabetic.  Review of systems: See HPI otherwise negative  Procedures: Visit Diagnoses:  1. Pes anserinus bursitis of right knee   2. Chronic pain of left knee     Large Joint Inj: L knee on 12/18/2023 2:25 PM Indications: pain Details: 22 G 1.5 in needle, anterolateral approach  Arthrogram: No  Medications: 3 mL lidocaine  1 %; 40 mg methylPREDNISolone  acetate 40 MG/ML Outcome: tolerated well, no immediate complications Procedure, treatment alternatives, risks and benefits explained, specific risks discussed. Consent was given by the patient. Immediately prior to procedure a time out was called to verify the correct patient, procedure, equipment, support staff and site/side marked as required. Patient was prepped and draped in the usual sterile fashion.    Trigger Point Inj  Date/Time: 12/18/2023 2:26 PM  Performed by: Bronson Canny, PA-C Authorized by: Bronson Canny, PA-C   Consent Given by:  Patient Site marked: the procedure site was marked   Timeout: prior to procedure the correct patient, procedure, and site was verified   Indications:  Pain and therapeutic Total # of Trigger Points:  1 Location: lower extremity   Needle Size:  25  G Medications #1:  1 mL lidocaine  1 %; 20 mg methylPREDNISolone  acetate 40 MG/ML   Plan: He did ask about getting a viscosupplementation injection for his left knee will work on trying to gain approval for this.  He has no surgery scheduled on either knee in the next 6 months.Is on chronic Eliquis  and unable to take NSAIDs.  He has tried Tylenol  for his knee pain.  He has also tried cortisone injections.

## 2023-12-18 NOTE — Telephone Encounter (Signed)
 LT KNEE VISCO

## 2023-12-22 NOTE — Telephone Encounter (Signed)
 VOB submitted for Monovisc, left knee

## 2024-01-11 ENCOUNTER — Ambulatory Visit: Attending: Cardiology | Admitting: Cardiology

## 2024-01-11 VITALS — BP 142/80 | HR 85 | Ht 66.0 in | Wt 205.1 lb

## 2024-01-11 DIAGNOSIS — I2089 Other forms of angina pectoris: Secondary | ICD-10-CM

## 2024-01-11 DIAGNOSIS — I10 Essential (primary) hypertension: Secondary | ICD-10-CM

## 2024-01-11 DIAGNOSIS — R0609 Other forms of dyspnea: Secondary | ICD-10-CM

## 2024-01-11 DIAGNOSIS — I4819 Other persistent atrial fibrillation: Secondary | ICD-10-CM

## 2024-01-11 DIAGNOSIS — I2510 Atherosclerotic heart disease of native coronary artery without angina pectoris: Secondary | ICD-10-CM | POA: Diagnosis not present

## 2024-01-11 DIAGNOSIS — F17200 Nicotine dependence, unspecified, uncomplicated: Secondary | ICD-10-CM

## 2024-01-11 MED ORDER — LISINOPRIL 40 MG PO TABS: 40.0000 mg | ORAL_TABLET | Freq: Every day | ORAL | 3 refills | Status: DC

## 2024-01-11 NOTE — Patient Instructions (Signed)
 Medication Instructions:  Your physician recommends the following medication changes.  INCREASE: Lisinopril  to 40 mg   *If you need a refill on your cardiac medications before your next appointment, please call your pharmacy*  Lab Work: No labs ordered today  If you have labs (blood work) drawn today and your tests are completely normal, you will receive your results only by: MyChart Message (if you have MyChart) OR A paper copy in the mail If you have any lab test that is abnormal or we need to change your treatment, we will call you to review the results.  Testing/Procedures: Your physician has requested that you have an echocardiogram. Echocardiography is a painless test that uses sound waves to create images of your heart. It provides your doctor with information about the size and shape of your heart and how well your heart's chambers and valves are working.   You may receive an ultrasound enhancing agent through an IV if needed to better visualize your heart during the echo. This procedure takes approximately one hour.  There are no restrictions for this procedure.  This will take place at [address] [facility] [address] [facility]) [address], [place] [address]  Please note: We ask at that you not bring children with you during ultrasound (echo/ vascular) testing. Due to room size and safety concerns, children are not allowed in the ultrasound rooms during exams. Our front office staff cannot provide observation of children in our lobby area while testing is being conducted. An adult accompanying a patient to their appointment will only be allowed in the ultrasound room at the discretion of the ultrasound technician under special circumstances. We apologize for any inconvenience.   Your provider has ordered a Lexiscan/ Exercise Myoview Stress test. This will take place at [facility]. Please report to the [facility] medical mall entrance. The volunteers at the first desk will direct you where to go.   [facility] MYOVIEW  Your provider has ordered a Stress Test with nuclear imaging. The purpose of this test is to evaluate the blood supply to your heart muscle. This procedure is referred to as a Non-Invasive Stress Test. This is because other than having an IV started in your vein, nothing is inserted or invades your body. Cardiac stress tests are done to find areas of poor blood flow to the heart by determining the extent of coronary artery disease (CAD). Some patients exercise on a treadmill, which naturally increases the blood flow to your heart, while others who are unable to walk on a treadmill due to physical limitations will have a pharmacologic/chemical stress agent called Lexiscan . This medicine will mimic walking on a treadmill by temporarily increasing your coronary blood flow.   Please note: these test may take anywhere between 2-4 hours to complete  How to prepare for your Myoview test:  Nothing to eat for 6 hours prior to the test No caffeine for 24 hours prior to test No smoking 24 hours prior to test. Your medication may be taken with water.  If your doctor stopped a medication because of this test, do not take that medication. Ladies, please do not wear dresses.  Skirts or pants are appropriate. Please wear a short sleeve shirt. No perfume, cologne or lotion. Wear comfortable walking shoes. No heels!   PLEASE NOTIFY THE OFFICE AT LEAST 24 HOURS IN ADVANCE IF YOU ARE UNABLE TO KEEP YOUR APPOINTMENT.  [phone] AND  PLEASE NOTIFY NUCLEAR MEDICINE AT [facility] AT LEAST 24 HOURS IN ADVANCE IF YOU ARE  UNABLE TO KEEP YOUR APPOINTMENT. [phone]   Follow-Up: At [facility], you and your health needs are our priority.  As part of our continuing mission to provide you with exceptional heart care, our providers are all part of one team.  This team includes your primary Cardiologist (physician) and Advanced Practice Providers or APPs (Physician Assistants and Nurse  Practitioners) who all work together to provide you with the care you need, when you need it.  Your next appointment:   3 month(s)  Provider:   You may see Redell Cave, MD or one of the following Advanced Practice Providers on your designated Care Team:   Lonni Meager, NP Lesley Maffucci, PA-C Bernardino Bring, PA-C Cadence Noyack, PA-C Tylene Lunch, NP Barnie Hila, NP    We recommend signing up for the patient portal called MyChart.  Sign up information is provided on this After Visit Summary.  MyChart is used to connect with patients for Virtual Visits (Telemedicine).  Patients are able to view lab/test results, encounter notes, upcoming appointments, etc.  Non-urgent messages can be sent to your provider as well.   To learn more about what you can do with MyChart, go to [email or web address].

## 2024-01-11 NOTE — Progress Notes (Signed)
 Cardiology Office Note:    Date:  01/11/2024   ID:  [name], DOB 11-28-1950, MRN [id number]  PCP:  Rilla Baller, MD  [facility] Cardiologist:  Redell Cave, MD  [facility] Electrophysiologist:  None   Referring MD: Rilla Baller, MD   Chief Complaint  Patient presents with   12 month follow up     Patient c/o shortness of breath with walking a short distance with some chest discomfort that comes and goes.     History of Present Illness:    [name] is a 73 y.o. male with a hx of persistent A. fib on Eliquis , nonobstructive CAD (LHC 2016 LAD 30%, RCA 20%), hypertension, hyperlipidemia,  COPD, current smoker x40+ years, DDD, who presents for follow-up.   Complains of worsening dyspnea on exertion over the past several months.  Has occasional nonspecific chest pain, rare heart flutters.  Is compliant with metoprolol , Eliquis  as prescribed.  No bleeding issues.  Blood pressures at home range in the 130s to 150s systolic.  Patient not sure if his heart or his lungs are causing his symptoms.  He still smokes.   Prior notes  patient with history of neck pains, back pains with radiculopathy.  Diagnosed with cervical spinal stenosis.  Laminectomy is being planned.  Saw primary care provider for preop exam, EKG noted to be in atrial fibrillation rapid ventricular response.  He was started on Eliquis  and Lopressor .  Echo 03/2020 showed low normal systolic function, EF 50%. DC cardioversion was attempted on 05/06/2020 unsuccessfully. DC cardioversion attempt on 04/2020 was unsuccessful.  Evaluated by EP, deemed not a good candidate for follow-up ablation given history of COPD and ongoing smoker.     Past Medical History:  Diagnosis Date   Allergic rhinitis 07/1998   Aortic atherosclerosis (HCC) 09/17/2016   Arthritis    BACK AND RIGHT KNEE   CAD (coronary artery disease) 07/01/2015   a.) LHC 07/01/2015: normal LV function; 30% p-mLAD, 20% p-mRCA;  intervention deferred opting for medical mgmt.   COPD (chronic obstructive pulmonary disease) (HCC) 07/1998   centrilobular emphysema (09/2016) w ongoing tobacco use, spirometry (02/2013)   DDD (degenerative disc disease), cervical    DOE (dyspnea on exertion)    Erectile dysfunction    a.) on PDE5i (sildenafil )   ETOH abuse    a.) consumes 4-5 beers on a daily basis   History of exposure to asbestos    HTN (hypertension) 05/1998   Hyperlipidemia 06/1996   Hypertensive retinopathy    Long term current use of anticoagulant    a.) apixaban    Lumbar spinal stenosis 03/2013   a.) s/p laminectomy   Macrocytic anemia    Migraines    Peripheral vascular disease (HCC)    Persistent atrial fibrillation (HCC)    a.) CHA2DS2-VASc = 3 (age, HTN, aortic plaque). 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   Smoker     Past Surgical History:  Procedure Laterality Date   ANTERIOR CERVICAL DECOMP/DISCECTOMY FUSION  2003   C3-7   APPLICATION OF INTRAOPERATIVE CT SCAN N/A 10/11/2021   Procedure: APPLICATION OF INTRAOPERATIVE CT SCAN;  Surgeon: Clois Fret, MD;  Location: [facility] ORS;  Service: Neurosurgery;  Laterality: N/A;   BACK SURGERY  2007   CARDIAC  CATHETERIZATION N/A 07/01/2015   Procedure: Left Heart Cath and Coronary Angiography;  Surgeon: Deatrice DELENA Cage, MD;  [facility]: [facility];  Service: Cardiovascular;  Laterality: N/A;   CARDIOVERSION N/A 05/06/2020   Procedure: CARDIOVERSION;  Surgeon: Darliss Rogue, MD;  Location: [facility] ORS;  Service: Cardiovascular;  Laterality: N/A;   COLONOSCOPY  09/2010   HPx2, diverticula, hemorrhoids, rpt 10 yrs [place])   COLONOSCOPY  01/07/2021   SSAx2, diverticulosis, rpt 7 yrs ([place])   CYSTOSCOPY  WITH INSERTION OF UROLIFT N/A 11/02/2020   Procedure: CYSTOSCOPY WITH INSERTION OF UROLIFT;  Surgeon: Penne Knee, MD;  Location: [facility] ORS;  Service: Urology;  Laterality: N/A;   ETT  2010   normal, ABIs normal   HIP SURGERY Right 08/2022   R lateral femoral cutaneous nerve decompression/transposition   KNEE ARTHROSCOPY Right 2011   cartlage, ligaments   KNEE ARTHROSCOPY Left 1984   LUMBAR LAMINECTOMY  06/2020   Bilateral L2-3 laminectomy Gavin)   LUMBAR LAMINECTOMY/DECOMPRESSION MICRODISCECTOMY Left 03/27/2013   Procedure: LUMBAR LAMINECTOMY/DECOMPRESSION MICRODISCECTOMY 2 LEVELS;  Surgeon: Reyes JONETTA Budge, MD;  Location: [facility];  Service: Neurosurgery;  Laterality: Left;  LEFT Lumbar Three Four diskectomy with Lumbar Three-Four, Four-Five Laminectomy. L3/4 ahd L4/5 for LSS Gavin)    LUMBAR LAMINECTOMY/DECOMPRESSION MICRODISCECTOMY Bilateral 07/15/2020   Procedure: LAMINECTOMY AND FORAMINOTOMY BILATERAL LUMBAR TWO-LUMBAR THREE;  Surgeon: Budge Reyes, MD;  Location: [facility] OR;  Service: Neurosurgery;  Laterality: Bilateral;   POSTERIOR CERVICAL FUSION/FORAMINOTOMY N/A 05/14/2020   Procedure: LAMINECTOMY, POSTERIOR INSTRUMENTATION AND FUSION, CERVICAL ONE- CERVICAL TWO, CERVICAL TWO- CERVICAL THREE;  Surgeon: Budge Reyes, MD;  Location: [facility] OR;  Service: Neurosurgery;  Laterality: N/A;   POSTERIOR CERVICAL FUSION/FORAMINOTOMY N/A 10/11/2021   Procedure: C6-T3 POSTERIOR FUSION;  Surgeon: Clois Fret, MD;  Location: [facility] ORS;  Service: Neurosurgery;  Laterality: N/A;   POSTERIOR CERVICAL LAMINECTOMY N/A 10/11/2021   Procedure: C6-T2 POSTERIOR SPINAL DECOMPRESSION;  Surgeon: Clois Fret, MD;  Location: [facility] ORS;  Service: Neurosurgery;  Laterality: N/A;   SHOULDER ARTHROSCOPY Right 2011   spriometry  02/2013   COPD with some restriction thought to be obesity related ([place])   TOTAL KNEE ARTHROPLASTY Right 06/27/2014   Procedure: RIGHT TOTAL KNEE  ARTHROPLASTY;  Surgeon: Lonni CINDERELLA Poli, MD;  Location: [facility];  Service: Orthopedics;  Laterality: Right;   US  ECHOCARDIOGRAPHY  01/2013   WNL, mild diastolic dysfunction    Current Medications: Current Meds  Medication Sig   acetaminophen  (TYLENOL  8 HOUR) 650 MG CR tablet Take 1 tablet (650 mg total) by mouth in the morning and at bedtime.   Albuterol  Sulfate (PROAIR  RESPICLICK) 108 (90 Base) MCG/ACT AEPB Inhale 2 puffs into the lungs every 6 (six) hours as needed (cough, shortness of breath).   apixaban  (ELIQUIS ) 5 MG TABS tablet Take 5 mg by mouth 2 (two) times daily.   atorvastatin  (LIPITOR) 10 MG tablet TAKE 1 TABLET(10 MG) BY MOUTH DAILY   fluticasone -salmeterol (WIXELA INHUB) 250-50 MCG/ACT AEPB Inhale 1 puff into the lungs daily.   loratadine (CLARITIN) 10 MG tablet Take 10 mg by mouth as needed.   metoprolol  tartrate (LOPRESSOR ) 50 MG tablet Take 1 tablet (50 mg total) by mouth 2 (two) times daily.   nicotine  polacrilex (NICORETTE) 4 MG gum Take 4 mg by mouth as needed.   OVER THE COUNTER MEDICATION Take 2 tablets by mouth daily. sulfurzyme   sildenafil  (VIAGRA ) 50 MG tablet Take 50 mg by mouth daily as needed for erectile dysfunction. Taking 1/2 tablet PRN (  from [facility])   tiotropium (SPIRIVA ) 18 MCG inhalation capsule Place 18 mcg into inhaler and inhale daily.   [DISCONTINUED] lisinopril  (ZESTRIL ) 20 MG tablet Take 1 tablet (20 mg total) by mouth daily.     Allergies:   Varenicline    Social History   Socioeconomic History   Marital status: Married    Spouse name: Not on file   Number of children: Not on file   Years of education: Not on file   Highest education level: Not on file  Occupational History   Not on file  Tobacco Use   Smoking status: Some Days    Current packs/day: 0.50    Average packs/day: 0.5 packs/day for 40.0 years (20.0 ttl pk-yrs)    Types: Cigarettes   Smokeless tobacco: Never   Tobacco comments:    3 cigarattes over the past 3  weeks as of 10-06-21  Vaping Use   Vaping status: Never Used  Substance and Sexual Activity   Alcohol use: Yes    Alcohol/week: 28.0 standard drinks of alcohol    Types: 28 Cans of beer per week    Comment: 4-5 beers daily   Drug use: No   Sexual activity: Not Currently  Other Topics Concern   Not on file  Social History Narrative   Lives with wife. Has 1 son, 2 daughters.    Occupation: Event organiser.    Activity: no exercise   Diet: good water, fruits/vegetables daily      Screened positive for OSA at recent hospitalization (03/2013)   Social Drivers of Health   Financial Resource Strain: Low Risk  (07/19/2022)   Received from [facility]   Overall Financial Resource Strain (CARDIA)    Difficulty of Paying Living Expenses: Not hard at all  Food Insecurity: No Food Insecurity (07/19/2022)   Received from [facility]   Hunger Vital Sign    Within the past 12 months, you worried that your food would run out before you got the money to buy more.: Never true    Within the past 12 months, the food you bought just didn't last and you didn't have money to get more.: Never true  Transportation Needs: No Transportation Needs (07/19/2022)   Received from [facility] - Transportation    In the past 12 months, has lack of transportation kept you from medical appointments or from getting medications?: No    Lack of Transportation (Non-Medical): No  Physical Activity: Inactive (12/29/2020)   Exercise Vital Sign    Days of Exercise per Week: 0 days    Minutes of Exercise per Session: 0 min  Stress: No Stress Concern Present (12/29/2020)   [employer] of [employer]ational [employer] - Occupational Stress Questionnaire    Feeling of Stress : Not at all  Social Connections: Not on file     Family History: The patient's family history includes Coronary artery disease in his father and mother; Diabetes in his father; Hypertension in  his father and mother; Irregular heart beat in his brother; Maple syrup urine disease in his brother; Pneumonia in his sister; Stroke in his father and mother. There is no history of Cancer.  ROS:   Please see the history of present illness.     All other systems reviewed and are negative.  EKGs/Labs/Other Studies Reviewed:    The following studies were reviewed today:   EKG:EKG Interpretation Date/Time:  Thursday January 11 2024 08:00:19 EDT Ventricular Rate:  85 PR Interval:    QRS Duration:  92 QT Interval:  358 QTC Calculation: 426 R Axis:   31  Text Interpretation: Atrial fibrillation Inferior infarct , age undetermined Confirmed by Darliss Rogue ([id number]) on 01/11/2024 8:21:02 AM    Recent Labs: No results found for requested labs within last 365 days.  Recent Lipid Panel    Component Value Date/Time   CHOL 152 01/09/2023 0738   TRIG 39.0 01/09/2023 0738   HDL 85.90 01/09/2023 0738   CHOLHDL 2 01/09/2023 0738   VLDL 7.8 01/09/2023 0738   LDLCALC 58 01/09/2023 0738   LDLDIRECT 82.0 09/07/2016 0900    Physical Exam:    VS:  BP (!) 142/80 (BP Location: Left Arm, Patient Position: Sitting, Cuff Size: Normal)   Pulse 85   Ht 5' 6 (1.676 m)   Wt 205 lb 2 oz (93 kg)   SpO2 95%   BMI 33.11 kg/m     Wt Readings from Last 3 Encounters:  01/11/24 205 lb 2 oz (93 kg)  08/18/23 200 lb (90.7 kg)  01/16/23 198 lb 6 oz (90 kg)     GEN:  Well nourished, well developed in no acute distress HEENT: Normal NECK: No JVD; No carotid bruits CARDIAC: Irregular irregular, no murmurs, rubs, gallops RESPIRATORY: Decreased breath sounds, no wheezing ABDOMEN: Soft, non-tender, non-distended MUSCULOSKELETAL:  No edema; No deformity  SKIN: Warm and dry NEUROLOGIC:  Alert and oriented x 3 PSYCHIATRIC:  Normal affect   ASSESSMENT:    1. Dyspnea on exertion   2. Persistent atrial fibrillation (HCC)   3. Coronary artery disease involving native coronary artery of native heart  without angina pectoris   4. Primary hypertension   5. Current smoker   6. Anginal equivalent (HCC)    PLAN:    In order of problems listed above:  Dyspnea on exertion, this could be an anginal equivalent.  Obtain echo, obtain Lexiscan Myoview.  If cardiac testing unrevealing, etiology could also be COPD, current smoking. persistent atrial fibrillation. Failed DCCV on 04/2020 CHA2DS2-VASc score 3(age, htn, vasc).  Clinically asymptomatic, heart rate controlled.  Last EF 50%.  Continue Lopressor  50 mg twice daily, Eliquis  5 mg twice daily. nonobstructive CAD (30% mLAD, 20% mRCA).  Asymptomatic. Continue Eliquis , Lipitor. Hypertension, BP elevated.  Increase lisinopril  to 40 mg daily.  Continue lopressor  50 mg twice daily. Current smoker, smoking cessation again recommended.  Follow-up in 2 to 3 months.  Informed Consent   Shared Decision Making/Informed Consent The risks [chest pain, shortness of breath, cardiac arrhythmias, dizziness, blood pressure fluctuations, myocardial infarction, stroke/transient ischemic attack, nausea, vomiting, allergic reaction, radiation exposure, metallic taste sensation and life-threatening complications (estimated to be 1 in 10,000)], benefits (risk stratification, diagnosing coronary artery disease, treatment guidance) and alternatives of a nuclear stress test were discussed in detail with Mr. Hume and he agrees to proceed.      Medication Adjustments/Labs and Tests Ordered: Current medicines are reviewed at length with the patient today.  Concerns regarding medicines are outlined above.  Orders Placed This Encounter  Procedures   NM Myocar Multi W/Spect W/Wall Motion / EF   EKG 12-Lead   ECHOCARDIOGRAM COMPLETE    Meds ordered this encounter  Medications   lisinopril  (ZESTRIL ) 40 MG tablet    Sig: Take 1 tablet (40 mg total) by mouth daily.    Dispense:  30 tablet    Refill:  3     Patient Instructions  Medication Instructions:  Your  physician recommends  the following medication changes.  INCREASE: Lisinopril  to 40 mg   *If you need a refill on your cardiac medications before your next appointment, please call your pharmacy*  Lab Work: No labs ordered today  If you have labs (blood work) drawn today and your tests are completely normal, you will receive your results only by: MyChart Message (if you have MyChart) OR A paper copy in the mail If you have any lab test that is abnormal or we need to change your treatment, we will call you to review the results.  Testing/Procedures: Your physician has requested that you have an echocardiogram. Echocardiography is a painless test that uses sound waves to create images of your heart. It provides your doctor with information about the size and shape of your heart and how well your heart's chambers and valves are working.   You may receive an ultrasound enhancing agent through an IV if needed to better visualize your heart during the echo. This procedure takes approximately one hour.  There are no restrictions for this procedure.  This will take place at [address] [facility] [address] [facility], [place] [address]  Please note: We ask at that you not bring children with you during ultrasound (echo/ vascular) testing. Due to room size and safety concerns, children are not allowed in the ultrasound rooms during exams. Our front office staff cannot provide observation of children in our lobby area while testing is being conducted. An adult accompanying a patient to their appointment will only be allowed in the ultrasound room at the discretion of the ultrasound technician under special circumstances. We apologize for any inconvenience.   Your provider has ordered a Lexiscan/ Exercise Myoview Stress test. This will take place at [facility]. Please report to the [facility] medical mall entrance. The volunteers at the first desk will direct you where to go.  [facility] MYOVIEW  Your provider has  ordered a Stress Test with nuclear imaging. The purpose of this test is to evaluate the blood supply to your heart muscle. This procedure is referred to as a Non-Invasive Stress Test. This is because other than having an IV started in your vein, nothing is inserted or invades your body. Cardiac stress tests are done to find areas of poor blood flow to the heart by determining the extent of coronary artery disease (CAD). Some patients exercise on a treadmill, which naturally increases the blood flow to your heart, while others who are unable to walk on a treadmill due to physical limitations will have a pharmacologic/chemical stress agent called Lexiscan . This medicine will mimic walking on a treadmill by temporarily increasing your coronary blood flow.   Please note: these test may take anywhere between 2-4 hours to complete  How to prepare for your Myoview test:  Nothing to eat for 6 hours prior to the test No caffeine for 24 hours prior to test No smoking 24 hours prior to test. Your medication may be taken with water.  If your doctor stopped a medication because of this test, do not take that medication. Ladies, please do not wear dresses.  Skirts or pants are appropriate. Please wear a short sleeve shirt. No perfume, cologne or lotion. Wear comfortable walking shoes. No heels!   PLEASE NOTIFY THE OFFICE AT LEAST 24 HOURS IN ADVANCE IF YOU ARE UNABLE TO KEEP YOUR APPOINTMENT.  [phone] AND  PLEASE NOTIFY NUCLEAR MEDICINE AT [facility] AT LEAST 24 HOURS IN ADVANCE IF YOU ARE UNABLE TO KEEP YOUR APPOINTMENT. [phone]  Follow-Up: At [facility], you and your health needs are our priority.  As part of our continuing mission to provide you with exceptional heart care, our providers are all part of one team.  This team includes your primary Cardiologist (physician) and Advanced Practice Providers or APPs (Physician Assistants and Nurse Practitioners) who all work together to  provide you with the care you need, when you need it.  Your next appointment:   3 month(s)  Provider:   You may see Redell Cave, MD or one of the following Advanced Practice Providers on your designated Care Team:   Lonni Meager, NP Lesley Maffucci, PA-C Bernardino Bring, PA-C Cadence Montague, PA-C Tylene Lunch, NP Barnie Hila, NP    We recommend signing up for the patient portal called MyChart.  Sign up information is provided on this After Visit Summary.  MyChart is used to connect with patients for Virtual Visits (Telemedicine).  Patients are able to view lab/test results, encounter notes, upcoming appointments, etc.  Non-urgent messages can be sent to your provider as well.   To learn more about what you can do with MyChart, go to [email or web address].      Signed, Redell Cave, MD  01/11/2024 12:22 PM    [facility]

## 2024-01-13 ENCOUNTER — Other Ambulatory Visit: Payer: Self-pay | Admitting: Family Medicine

## 2024-01-13 DIAGNOSIS — I4819 Other persistent atrial fibrillation: Secondary | ICD-10-CM

## 2024-01-13 DIAGNOSIS — E559 Vitamin D deficiency, unspecified: Secondary | ICD-10-CM

## 2024-01-13 DIAGNOSIS — R7303 Prediabetes: Secondary | ICD-10-CM

## 2024-01-13 DIAGNOSIS — F1090 Alcohol use, unspecified, uncomplicated: Secondary | ICD-10-CM

## 2024-01-13 DIAGNOSIS — D539 Nutritional anemia, unspecified: Secondary | ICD-10-CM

## 2024-01-13 DIAGNOSIS — N400 Enlarged prostate without lower urinary tract symptoms: Secondary | ICD-10-CM

## 2024-01-13 DIAGNOSIS — E7800 Pure hypercholesterolemia, unspecified: Secondary | ICD-10-CM

## 2024-01-15 ENCOUNTER — Other Ambulatory Visit (INDEPENDENT_AMBULATORY_CARE_PROVIDER_SITE_OTHER): Payer: PPO

## 2024-01-15 DIAGNOSIS — I4819 Other persistent atrial fibrillation: Secondary | ICD-10-CM

## 2024-01-15 DIAGNOSIS — D539 Nutritional anemia, unspecified: Secondary | ICD-10-CM

## 2024-01-15 DIAGNOSIS — F1090 Alcohol use, unspecified, uncomplicated: Secondary | ICD-10-CM | POA: Diagnosis not present

## 2024-01-15 DIAGNOSIS — E559 Vitamin D deficiency, unspecified: Secondary | ICD-10-CM

## 2024-01-15 DIAGNOSIS — R7303 Prediabetes: Secondary | ICD-10-CM | POA: Diagnosis not present

## 2024-01-15 DIAGNOSIS — E7800 Pure hypercholesterolemia, unspecified: Secondary | ICD-10-CM | POA: Diagnosis not present

## 2024-01-15 DIAGNOSIS — N400 Enlarged prostate without lower urinary tract symptoms: Secondary | ICD-10-CM | POA: Diagnosis not present

## 2024-01-15 LAB — CBC WITH DIFFERENTIAL/PLATELET
Basophils Absolute: 0 10*3/uL (ref 0.0–0.1)
Basophils Relative: 0.6 % (ref 0.0–3.0)
Eosinophils Absolute: 0.2 10*3/uL (ref 0.0–0.7)
Eosinophils Relative: 4.7 % (ref 0.0–5.0)
HCT: 35.1 % — ABNORMAL LOW (ref 39.0–52.0)
Hemoglobin: 12.1 g/dL — ABNORMAL LOW (ref 13.0–17.0)
Lymphocytes Relative: 38.8 % (ref 12.0–46.0)
Lymphs Abs: 1.8 10*3/uL (ref 0.7–4.0)
MCHC: 34.3 g/dL (ref 30.0–36.0)
MCV: 102.2 fl — ABNORMAL HIGH (ref 78.0–100.0)
Monocytes Absolute: 0.4 10*3/uL (ref 0.1–1.0)
Monocytes Relative: 9.4 % (ref 3.0–12.0)
Neutro Abs: 2.1 10*3/uL (ref 1.4–7.7)
Neutrophils Relative %: 46.5 % (ref 43.0–77.0)
Platelets: 206 10*3/uL (ref 150.0–400.0)
RBC: 3.43 Mil/uL — ABNORMAL LOW (ref 4.22–5.81)
RDW: 13.6 % (ref 11.5–15.5)
WBC: 4.6 10*3/uL (ref 4.0–10.5)

## 2024-01-15 LAB — COMPREHENSIVE METABOLIC PANEL WITH GFR
ALT: 43 U/L (ref 0–53)
AST: 30 U/L (ref 0–37)
Albumin: 4.5 g/dL (ref 3.5–5.2)
Alkaline Phosphatase: 81 U/L (ref 39–117)
BUN: 12 mg/dL (ref 6–23)
CO2: 27 meq/L (ref 19–32)
Calcium: 9.4 mg/dL (ref 8.4–10.5)
Chloride: 98 meq/L (ref 96–112)
Creatinine, Ser: 0.85 mg/dL (ref 0.40–1.50)
GFR: 86.79 mL/min (ref >60.00)
Glucose, Bld: 114 mg/dL — ABNORMAL HIGH (ref 70–99)
Potassium: 4.9 meq/L (ref 3.5–5.1)
Sodium: 133 meq/L — ABNORMAL LOW (ref 135–145)
Total Bilirubin: 0.5 mg/dL (ref 0.2–1.2)
Total Protein: 6.7 g/dL (ref 6.0–8.3)

## 2024-01-15 LAB — LIPID PANEL
Cholesterol: 172 mg/dL (ref 0–200)
HDL: 85.9 mg/dL (ref >39.00)
LDL Cholesterol: 77 mg/dL (ref 0–99)
NonHDL: 86.2
Total CHOL/HDL Ratio: 2
Triglycerides: 45 mg/dL (ref 0.0–149.0)
VLDL: 9 mg/dL (ref 0.0–40.0)

## 2024-01-15 LAB — PSA: PSA: 1.88 ng/mL (ref 0.10–4.00)

## 2024-01-15 LAB — FOLATE: Folate: 19.3 ng/mL (ref >5.9)

## 2024-01-15 LAB — VITAMIN D 25 HYDROXY (VIT D DEFICIENCY, FRACTURES): VITD: 38.67 ng/mL (ref 30.00–100.00)

## 2024-01-15 LAB — HEMOGLOBIN A1C: Hgb A1c MFr Bld: 5.9 % (ref 4.6–6.5)

## 2024-01-15 LAB — VITAMIN B12: Vitamin B-12: 729 pg/mL (ref 211–911)

## 2024-01-16 ENCOUNTER — Ambulatory Visit (INDEPENDENT_AMBULATORY_CARE_PROVIDER_SITE_OTHER)

## 2024-01-16 VITALS — BP 142/80 | Ht 66.0 in | Wt 205.0 lb

## 2024-01-16 DIAGNOSIS — Z0000 Encounter for general adult medical examination without abnormal findings: Secondary | ICD-10-CM | POA: Diagnosis not present

## 2024-01-16 NOTE — Progress Notes (Signed)
 Because this visit was a virtual/telehealth visit,  certain criteria was not obtained, such a blood pressure, CBG if applicable, and timed get up and go. Any medications not marked as taking were not mentioned during the medication reconciliation part of the visit. Any vitals not documented were not able to be obtained due to this being a telehealth visit or patient was unable to self-report a recent blood pressure reading due to a lack of equipment at home via telehealth. Vitals that have been documented are verbally provided by the patient.   This visit was performed by a medical professional under my direct supervision. I was immediately available for consultation/collaboration. I have reviewed and agree with the Annual Wellness Visit documentation.  Subjective:   [name] is a 73 y.o. who presents for a Medicare Wellness preventive visit.  As a reminder, Annual Wellness Visits don't include a physical exam, and some assessments may be limited, especially if this visit is performed virtually. We may recommend an in-person follow-up visit with your provider if needed.  Visit Complete: Virtual I connected with  [name] on 01/16/24 by a audio enabled telemedicine application and verified that I am speaking with the correct person using two identifiers.  Patient Location: Home  Provider Location: Home Office  I discussed the limitations of evaluation and management by telemedicine. The patient expressed understanding and agreed to proceed.  Vital Signs: Because this visit was a virtual/telehealth visit, some criteria may be missing or patient reported. Any vitals not documented were not able to be obtained and vitals that have been documented are patient reported.  VideoDeclined- This patient declined Librarian, academic. Therefore the visit was completed with audio only.  Persons Participating in Visit: Patient.  AWV Questionnaire: No: Patient  Medicare AWV questionnaire was not completed prior to this visit.  Cardiac Risk Factors include: advanced age (>83men, >26 women);male gender;obesity (BMI >30kg/m2);smoking/ tobacco exposure;hypertension;dyslipidemia     Objective:    Today's Vitals   01/16/24 1029  BP: (!) 142/80  Weight: 205 lb (93 kg)  Height: 5' 6 (1.676 m)   Body mass index is 33.09 kg/m.     01/16/2024   10:33 AM 10/11/2021    1:39 PM 10/06/2021    1:01 PM 12/29/2020   10:26 AM 11/02/2020    6:57 AM 10/26/2020    8:47 AM 07/07/2020   10:20 AM  Advanced Directives  Does Patient Have a Medical Advance Directive? Yes Yes Yes Yes Yes Yes Yes  Type of Estate agent of [place];Living will Healthcare Power of [place];Living will  Healthcare Power of [place];Living will Healthcare Power of [place] Living will Healthcare Power of [place];Living will  Does patient want to make changes to medical advance directive? No - Patient declined No - Patient declined   No - Guardian declined  No - Patient declined  Copy of Healthcare Power of Attorney in Chart? No - copy requested   No - copy requested   No - copy requested  Would patient like information on creating a medical advance directive?     No - Patient declined      Current Medications (verified) Outpatient Encounter Medications as of 01/16/2024  Medication Sig   acetaminophen  (TYLENOL  8 HOUR) 650 MG CR tablet Take 1 tablet (650 mg total) by mouth in the morning and at bedtime.   Albuterol  Sulfate (PROAIR  RESPICLICK) 108 (90 Base) MCG/ACT AEPB Inhale 2 puffs into the lungs every 6 (six) hours as needed (  cough, shortness of breath).   apixaban  (ELIQUIS ) 5 MG TABS tablet Take 5 mg by mouth 2 (two) times daily.   atorvastatin  (LIPITOR) 10 MG tablet TAKE 1 TABLET(10 MG) BY MOUTH DAILY   fluticasone -salmeterol (WIXELA INHUB) 250-50 MCG/ACT AEPB Inhale 1 puff into the lungs daily.   lisinopril  (ZESTRIL ) 40 MG tablet Take 1 tablet (40 mg total) by  mouth daily.   loratadine (CLARITIN) 10 MG tablet Take 10 mg by mouth as needed.   metoprolol  tartrate (LOPRESSOR ) 50 MG tablet Take 1 tablet (50 mg total) by mouth 2 (two) times daily.   nicotine  polacrilex (NICORETTE) 4 MG gum Take 4 mg by mouth as needed.   OVER THE COUNTER MEDICATION Take 2 tablets by mouth daily. sulfurzyme   sildenafil  (VIAGRA ) 50 MG tablet Take 50 mg by mouth daily as needed for erectile dysfunction. Taking 1/2 tablet PRN (from [facility])   tiotropium (SPIRIVA ) 18 MCG inhalation capsule Place 18 mcg into inhaler and inhale daily.   Cholecalciferol  25 MCG (1000 UT) tablet Take 1,000 Units by mouth daily. (Patient not taking: Reported on 01/16/2024)   No facility-administered encounter medications on file as of 01/16/2024.    Allergies (verified) Varenicline    History: Past Medical History:  Diagnosis Date   Allergic rhinitis 07/1998   Aortic atherosclerosis (HCC) 09/17/2016   Arthritis    BACK AND RIGHT KNEE   CAD (coronary artery disease) 07/01/2015   a.) LHC 07/01/2015: normal LV function; 30% p-mLAD, 20% p-mRCA; intervention deferred opting for medical mgmt.   COPD (chronic obstructive pulmonary disease) (HCC) 07/1998   centrilobular emphysema (09/2016) w ongoing tobacco use, spirometry (02/2013)   DDD (degenerative disc disease), cervical    DOE (dyspnea on exertion)    Erectile dysfunction    a.) on PDE5i (sildenafil )   ETOH abuse    a.) consumes 4-5 beers on a daily basis   History of exposure to asbestos    HTN (hypertension) 05/1998   Hyperlipidemia 06/1996   Hypertensive retinopathy    Long term current use of anticoagulant    a.) apixaban    Lumbar spinal stenosis 03/2013   a.) s/p laminectomy   Macrocytic anemia    Migraines    Peripheral vascular disease (HCC)    Persistent atrial fibrillation (HCC)    a.) CHA2DS2-VASc = 3 (age, HTN, aortic plaque). 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   Smoker    Past Surgical History:  Procedure Laterality Date   ANTERIOR CERVICAL DECOMP/DISCECTOMY FUSION  2003   C3-7   APPLICATION OF INTRAOPERATIVE CT SCAN N/A 10/11/2021   Procedure: APPLICATION OF INTRAOPERATIVE CT SCAN;  Surgeon: Clois Fret, MD;  Location: [facility] ORS;  Service: Neurosurgery;  Laterality: N/A;   BACK SURGERY  2007   CARDIAC CATHETERIZATION N/A 07/01/2015   Procedure: Left Heart Cath and Coronary Angiography;  Surgeon: Deatrice DELENA Cage, MD;  Location: [facility];  Service: Cardiovascular;  Laterality: N/A;   CARDIOVERSION N/A 05/06/2020   Procedure: CARDIOVERSION;  Surgeon: Darliss Rogue, MD;  Location: [facility];  Service: Cardiovascular;  Laterality: N/A;   COLONOSCOPY  09/2010   HPx2, diverticula, hemorrhoids, rpt 10 yrs [place])   COLONOSCOPY  01/07/2021   SSAx2, diverticulosis, rpt 7 yrs ([place])   CYSTOSCOPY WITH INSERTION  OF UROLIFT N/A 11/02/2020   Procedure: CYSTOSCOPY WITH INSERTION OF UROLIFT;  Surgeon: Penne Knee, MD;  Location: [facility] OR[facility];  Service: Urology;  Laterality: N/A;   ETT  2010   normal, ABIs normal   HIP SURGERY Right 08/2022   R lateral femoral cutaneous nerve decompression/transposition   KNEE ARTHROSCOPY Right 2011   [place], ligaments   KNEE ARTHROSCOPY Left 1984   LUMBAR LAMINECTOMY  06/2020   Bilateral L2-3 laminectomy [place])   LUMBAR LAMINECTOMY/DECOMPRESSION MICRODISCECTOMY Left 03/27/2013   Procedure: LUMBAR LAMINECTOMY/DECOMPRESSION MICRODISCECTOMY 2 LEVELS;  Surgeon: Reyes JONETTA Budge, MD;  Location: [facility];  Service: Neurosurgery;  Laterality: Left;  LEFT Lumbar Three Four diskectomy with Lumbar Three-Four, Four-Five Laminectomy. L3/4 ahd L4/5 for LSS [place])    LUMBAR LAMINECTOMY/DECOMPRESSION  MICRODISCECTOMY Bilateral 07/15/2020   Procedure: LAMINECTOMY AND FORAMINOTOMY BILATERAL LUMBAR TWO-LUMBAR THREE;  Surgeon: Budge Reyes, MD;  Location: [facility] OR;  Service: Neurosurgery;  Laterality: Bilateral;   POSTERIOR CERVICAL FUSION/FORAMINOTOMY N/A 05/14/2020   Procedure: LAMINECTOMY, POSTERIOR INSTRUMENTATION AND FUSION, CERVICAL ONE- CERVICAL TWO, CERVICAL TWO- CERVICAL THREE;  Surgeon: Budge Reyes, MD;  Location: [facility] OR;  Service: Neurosurgery;  Laterality: N/A;   POSTERIOR CERVICAL FUSION/FORAMINOTOMY N/A 10/11/2021   Procedure: C6-T3 POSTERIOR FUSION;  Surgeon: Clois Fret, MD;  Location: [facility] ORS;  Service: Neurosurgery;  Laterality: N/A;   POSTERIOR CERVICAL LAMINECTOMY N/A 10/11/2021   Procedure: C6-T2 POSTERIOR SPINAL DECOMPRESSION;  Surgeon: Clois Fret, MD;  Location: [facility] OR[facility];  Service: Neurosurgery;  Laterality: N/A;   SHOULDER ARTHROSCOPY Right 2011   spriometry  02/2013   COPD with some restriction thought to be obesity related ([place])   TOTAL KNEE ARTHROPLASTY Right 06/27/2014   Procedure: RIGHT TOTAL KNEE ARTHROPLASTY;  Surgeon: Lonni CINDERELLA Poli, MD;  Location: [facility];  Service: Orthopedics;  Laterality: Right;   US  ECHOCARDIOGRAPHY  01/2013   WNL, mild diastolic dysfunction   Family History  Problem Relation Age of Onset   Coronary artery disease Mother    Hypertension Mother    Stroke Mother    Coronary artery disease Father    Hypertension Father    Diabetes Father    Stroke Father    Pneumonia Sister    Irregular heart beat Brother    Maple syrup urine disease Brother    Cancer Neg Hx    Social History   Socioeconomic History   Marital status: Married    Spouse name: Not on file   Number of children: Not on file   Years of education: Not on file   Highest education level: Not on file  Occupational History   Not on file  Tobacco Use   Smoking status: Some Days    Current packs/day: 0.50    Average packs/day: 0.5  packs/day for 40.0 years (20.0 ttl pk-yrs)    Types: Cigarettes   Smokeless tobacco: Never   Tobacco comments:    3 cigarattes over the past 3 weeks as of 10-06-21  Vaping Use   Vaping status: Never Used  Substance and Sexual Activity   Alcohol use: Yes    Alcohol/week: 28.0 standard drinks of alcohol    Types: 28 Cans of beer per week    Comment: 4-5 beers daily   Drug use: No   Sexual activity: Not Currently  Other Topics Concern   Not on file  Social History Narrative   Lives with wife. Has 1 son, 2 daughters.    Occupation: Event organiser.    Activity: no exercise   Diet:  good water, fruits/vegetables daily      Screened positive for OSA at recent hospitalization (03/2013)   Social Drivers of Health   Financial Resource Strain: Low Risk  (01/16/2024)   Overall Financial Resource Strain (CARDIA)    Difficulty of Paying Living Expenses: Not very hard  Food Insecurity: No Food Insecurity (01/16/2024)   Hunger Vital Sign    Worried About Running Out of Food in the Last Year: Never true    Ran Out of Food in the Last Year: Never true  Transportation Needs: No Transportation Needs (01/16/2024)   PRAPARE - Administrator, Civil Service (Medical): No    Lack of Transportation (Non-Medical): No  Physical Activity: Insufficiently Active (01/16/2024)   Exercise Vital Sign    Days of Exercise per Week: 3 days    Minutes of Exercise per Session: 30 min  Stress: No Stress Concern Present (01/16/2024)   [employer] of Occupational Health - Occupational Stress Questionnaire    Feeling of Stress: Not at all  Social Connections: Socially Integrated (01/16/2024)   Social Connection and Isolation Panel    Frequency of Communication with Friends and Family: More than three times a week    Frequency of Social Gatherings with Friends and Family: More than three times a week    Attends Religious Services: More than 4 times per year    Active Member of [employer] or Organizations: Yes     Attends Engineer, structural: More than 4 times per year    Marital Status: Married    Tobacco Counseling Ready to quit: Not Answered Counseling given: Not Answered Tobacco comments: 3 cigarattes over the past 3 weeks as of 10-06-21    Clinical Intake:  Pre-visit preparation completed: Yes  Pain : No/denies pain     BMI - recorded: 33.09 Nutritional Status: BMI > 30  Obese Nutritional Risks: None Diabetes: No  Lab Results  Component Value Date   HGBA1C 5.9 01/15/2024   HGBA1C 5.8 01/09/2023   HGBA1C 5.7 01/05/2022     How often do you need to have someone help you when you read instructions, pamphlets, or other written materials from your doctor or pharmacy?: 1 - Never  Interpreter Needed?: No  Information entered by :: Jayleah Garbers,cma   Activities of Daily Living     01/16/2024   10:32 AM  In your present state of health, do you have any difficulty performing the following activities:  Hearing? 0  Vision? 0  Difficulty concentrating or making decisions? 0  Walking or climbing stairs? 0  Dressing or bathing? 0  Doing errands, shopping? 0  Preparing Food and eating ? N  Using the Toilet? N  In the past six months, have you accidently leaked urine? N  Do you have problems with loss of bowel control? N  Managing your Medications? N  Managing your Finances? N  Housekeeping or managing your Housekeeping? N    Patient Care Team: Rilla Baller, MD as PCP - General (Family Medicine) Darliss Rogue, MD as PCP - Cardiology (Cardiology) Fate Morna SAILOR, [facility] (Inactive) as Pharmacist (Pharmacist)  I have updated your Care Teams any recent Medical Services you may have received from other providers in the past year.     Assessment:   This is a routine wellness examination for [name].  Hearing/Vision screen Hearing Screening - Comments:: No difficulties Vision Screening - Comments:: Wears glasses   Goals Addressed  This Visit's Progress    Patient Stated   On track    Starting 09/12/18, I will continue to drink at least 48 oz of water daily.        Depression Screen     01/16/2024   10:34 AM 12/29/2020   10:28 AM 10/02/2019   11:16 AM 09/12/2018   12:18 PM 09/11/2017    9:36 AM 09/09/2016    9:36 AM  PHQ 2/9 Scores  PHQ - 2 Score 0 0 0 0 0 0  PHQ- 9 Score 0 0 0 0      Fall Risk     01/16/2024   10:32 AM 01/12/2022    9:45 AM 12/29/2020   10:28 AM 10/02/2019   11:15 AM 09/12/2018   12:18 PM  Fall Risk   Falls in the past year? 0 0 0 0  0   Number falls in past yr: 0  0 0   Injury with Fall? 0  0 0   Risk for fall due to : No Fall Risks  Medication side effect Medication side effect   Follow up Falls evaluation completed  Falls evaluation completed;Falls prevention discussed  Falls prevention discussed;Falls evaluation completed       Data saved with a previous flowsheet row definition    MEDICARE RISK AT HOME:  Medicare Risk at Home Any stairs in or around the home?: Yes If so, are there any without handrails?: No Home free of loose throw rugs in walkways, pet beds, electrical cords, etc?: Yes Adequate lighting in your home to reduce risk of falls?: Yes Life alert?: No Use of a cane, walker or w/c?: No Grab bars in the bathroom?: Yes Shower chair or bench in shower?: Yes Elevated toilet seat or a handicapped toilet?: Yes  TIMED UP AND GO:  Was the test performed?  No  Cognitive Function: 6CIT completed    12/29/2020   10:32 AM 10/02/2019   11:17 AM 09/12/2018   12:18 PM  MMSE - Mini Mental State Exam  Not completed: Refused    Orientation to time  5 5  Orientation to Place  5 5  Registration  3 3  Attention/ Calculation  5 0  Recall  2 3  Language- name 2 objects   0  Language- repeat  1 1  Language- follow 3 step command   2  Language- follow 3 step command-comments   unable to follow 1 step of 3 step command  Language- read & follow direction   0  Write a sentence   0   Copy design   0  Total score   19        01/16/2024   10:31 AM  6CIT Screen  What Year? 0 points  What month? 0 points  What time? 0 points  Count back from 20 0 points  Months in reverse 0 points  Repeat phrase 0 points  Total Score 0 points    Immunizations Immunization History  Administered Date(s) Administered   Fluad Quad(high Dose 65+) 04/23/2019, 04/21/2020, 04/25/2022   Influenza Split 04/17/2017   Influenza Whole 05/18/2005   Influenza, High Dose Seasonal PF 04/26/2018   Influenza,inj,Quad PF,6+ Mos 04/16/2014, 04/14/2015, 04/15/2016, 05/05/2017   Influenza,trivalent, recombinat, inj, PF 04/10/2018   Influenza-Unspecified 04/18/2019, 04/17/2020   Moderna Sars-Covid-2 Vaccination 10/23/2020   PFIZER(Purple Top)SARS-COV-2 Vaccination 08/24/2019, 09/17/2019   Pneumococcal Conjugate-13 09/09/2016, 04/10/2018   Pneumococcal Polysaccharide-23 08/26/2013, 09/12/2018   Rsv, Bivalent, Protein Subunit Rsvpref,pf [place]) 09/23/2022  Td 10/18/2002   Tdap 08/24/2012, 04/17/2014   Zoster Recombinant(Shingrix) 03/25/2022, 05/25/2022   Zoster, Live 10/03/2013    Screening Tests Health Maintenance  Topic Date Due   COLON CANCER SCREENING ANNUAL FOBT  09/20/2019   COVID-19 Vaccine (4 - 2024-25 season) 03/19/2023   INFLUENZA VACCINE  02/16/2024   DTaP/Tdap/Td (4 - Td or Tdap) 04/17/2024   Lung Cancer Screening  11/14/2024   Medicare Annual Wellness (AWV)  01/15/2025   Pneumococcal Vaccine: 50+ Years  Completed   Hepatitis C Screening  Completed   Zoster Vaccines- Shingrix  Completed   Hepatitis B Vaccines  Aged Out   HPV VACCINES  Aged Out   Meningococcal B Vaccine  Aged Out   Colonoscopy  Discontinued    Health Maintenance  Health Maintenance Due  Topic Date Due   COLON CANCER SCREENING ANNUAL FOBT  09/20/2019   COVID-19 Vaccine (4 - 2024-25 season) 03/19/2023   Health Maintenance Items Addressed:   Additional Screening:  Vision Screening: Recommended  annual ophthalmology exams for early detection of glaucoma and other disorders of the eye. Would you like a referral to an eye doctor? No    Dental Screening: Recommended annual dental exams for proper oral hygiene  Community Resource Referral / Chronic Care Management: CRR required this visit?  No   CCM required this visit?  No   Plan:    I have personally reviewed and noted the following in the patient's chart:   Medical and social history Use of alcohol, tobacco or illicit drugs  Current medications and supplements including opioid prescriptions. Patient is not currently taking opioid prescriptions. Functional ability and status Nutritional status Physical activity Advanced directives List of other physicians Hospitalizations, surgeries, and ER visits in previous 12 months Vitals Screenings to include cognitive, depression, and falls Referrals and appointments  In addition, I have reviewed and discussed with patient certain preventive protocols, quality metrics, and best practice recommendations. A written personalized care plan for preventive services as well as general preventive health recommendations were provided to patient.   Lyle MARLA Right, [place]   01/16/2024   After Visit Summary: (MyChart) Due to this being a telephonic visit, the after visit summary with patients personalized plan was offered to patient via MyChart   Notes: Nothing significant to report at this time.

## 2024-01-16 NOTE — Patient Instructions (Signed)
 [name] , Thank you for taking time out of your busy schedule to complete your Annual Wellness Visit with me. I enjoyed our conversation and look forward to speaking with you again next year. I, as well as your care team,  appreciate your ongoing commitment to your health goals. Please review the following plan we discussed and let me know if I can assist you in the future. Your Game plan/ To Do List    Referrals: If you haven't heard from the office you've been referred to, please reach out to them at the phone provided.  none Follow up Visits: Next Medicare AWV with our clinical staff: n/a   Have you seen your provider in the last 6 months (3 months if uncontrolled diabetes)? No Next Office Visit with your provider: 0707/2025  Clinician Recommendations:  Aim for 30 minutes of exercise or brisk walking, 6-8 glasses of water, and 5 servings of fruits and vegetables each day.       This is a list of the screening recommended for you and due dates:  Health Maintenance  Topic Date Due   Stool Blood Test  09/20/2019   COVID-19 Vaccine (4 - 2024-25 season) 03/19/2023   Flu Shot  02/16/2024   DTaP/Tdap/Td vaccine (4 - Td or Tdap) 04/17/2024   Screening for Lung Cancer  11/14/2024   Medicare Annual Wellness Visit  01/15/2025   Pneumococcal Vaccine for age over 44  Completed   Hepatitis C Screening  Completed   Zoster (Shingles) Vaccine  Completed   Hepatitis B Vaccine  Aged Out   HPV Vaccine  Aged Out   Meningitis B Vaccine  Aged Out   Colon Cancer Screening  Discontinued    Advanced directives: (Copy Requested) Please bring a copy of your health care power of attorney and living will to the office to be added to your chart at your convenience. You can mail to [facility] [address] [place], [place] [address] or email to [email or web address] Advance Care Planning is important because it:  [x]  Makes sure you receive the medical care that is consistent with  your values, goals, and preferences  [x]  It provides guidance to your family and loved ones and reduces their decisional burden about whether or not they are making the right decisions based on your wishes.  Follow the link provided in your after visit summary or read over the paperwork we have mailed to you to help you started getting your Advance Directives in place. If you need assistance in completing these, please reach out to us  so that we can help you!  See attachments for Preventive Care and Fall Prevention Tips.

## 2024-01-19 LAB — VITAMIN B1: Vitamin B1 (Thiamine): 10 nmol/L (ref 8–30)

## 2024-01-20 ENCOUNTER — Ambulatory Visit: Payer: Self-pay | Admitting: Family Medicine

## 2024-01-22 ENCOUNTER — Ambulatory Visit: Payer: PPO | Admitting: Family Medicine

## 2024-01-22 ENCOUNTER — Encounter: Payer: Self-pay | Admitting: Family Medicine

## 2024-01-22 VITALS — BP 138/82 | HR 74 | Temp 98.1°F | Ht 64.25 in | Wt 206.4 lb

## 2024-01-22 DIAGNOSIS — F1090 Alcohol use, unspecified, uncomplicated: Secondary | ICD-10-CM

## 2024-01-22 DIAGNOSIS — R7303 Prediabetes: Secondary | ICD-10-CM | POA: Diagnosis not present

## 2024-01-22 DIAGNOSIS — N281 Cyst of kidney, acquired: Secondary | ICD-10-CM

## 2024-01-22 DIAGNOSIS — I4819 Other persistent atrial fibrillation: Secondary | ICD-10-CM

## 2024-01-22 DIAGNOSIS — Z7189 Other specified counseling: Secondary | ICD-10-CM

## 2024-01-22 DIAGNOSIS — E559 Vitamin D deficiency, unspecified: Secondary | ICD-10-CM

## 2024-01-22 DIAGNOSIS — H6122 Impacted cerumen, left ear: Secondary | ICD-10-CM | POA: Diagnosis not present

## 2024-01-22 DIAGNOSIS — I2510 Atherosclerotic heart disease of native coronary artery without angina pectoris: Secondary | ICD-10-CM

## 2024-01-22 DIAGNOSIS — I10 Essential (primary) hypertension: Secondary | ICD-10-CM

## 2024-01-22 DIAGNOSIS — D539 Nutritional anemia, unspecified: Secondary | ICD-10-CM

## 2024-01-22 DIAGNOSIS — Z720 Tobacco use: Secondary | ICD-10-CM

## 2024-01-22 DIAGNOSIS — E7800 Pure hypercholesterolemia, unspecified: Secondary | ICD-10-CM | POA: Diagnosis not present

## 2024-01-22 DIAGNOSIS — Z0000 Encounter for general adult medical examination without abnormal findings: Secondary | ICD-10-CM | POA: Diagnosis not present

## 2024-01-22 DIAGNOSIS — J449 Chronic obstructive pulmonary disease, unspecified: Secondary | ICD-10-CM

## 2024-01-22 MED ORDER — ATORVASTATIN CALCIUM 10 MG PO TABS: ORAL_TABLET | ORAL | 3 refills | Status: AC

## 2024-01-22 NOTE — Assessment & Plan Note (Signed)
 Benign on latest imaging - no f/u needed.

## 2024-01-22 NOTE — Assessment & Plan Note (Signed)
 Chronic, ongoing smoking.  Discussed controller inhaler and rescue inhaler use.  He was not taking regular spiriva  - will restart this along with advair BID.

## 2024-01-22 NOTE — Assessment & Plan Note (Signed)
 Appreciate cardiology care on eliquis  and metoprolol 

## 2024-01-22 NOTE — Assessment & Plan Note (Signed)
 Rec restart 1000 units daily OTC

## 2024-01-22 NOTE — Assessment & Plan Note (Signed)
 Continue to monitor, encouraged limiting added sugar in diet.

## 2024-01-22 NOTE — Assessment & Plan Note (Addendum)
 Subjective hearing difficulty L>R with cerumen impaction noted on exam today.  Left ear irrigation performed today successfully with resolution.

## 2024-01-22 NOTE — Progress Notes (Signed)
 Ph: [phone] Fax: [phone]   Patient ID: [name], male    DOB: 1951/04/25, 73 y.o.   MRN: [id number]  This visit was conducted in person.  BP 138/82   Pulse 74   Temp 98.1 F (36.7 C) (Oral)   Ht 5' 4.25 (1.632 m)   Wt 206 lb 6 oz (93.6 kg)   SpO2 98%   BMI 35.15 kg/m    CC: CPE Subjective:   HPI: [name] is a 73 y.o. male presenting on 01/22/2024 for Annual Exam (MCR prt 2 [AWV- 01/16/24].)   Saw health advisor 01/2024 for medicare wellness visit. Note reviewed.   No results found.  Flowsheet Row Office Visit from 01/22/2024 in [facility]  PHQ-2 Total Score 0       01/22/2024    9:42 AM 01/16/2024   10:32 AM 01/12/2022    9:45 AM 12/29/2020   10:28 AM 10/02/2019   11:15 AM  Fall Risk   Falls in the past year? 0 0 0 0 0   Number falls in past yr:  0  0 0  Injury with Fall?  0  0 0  Risk for fall due to :  No Fall Risks  Medication side effect Medication side effect  Follow up  Falls evaluation completed  Falls evaluation completed;Falls prevention discussed  Falls prevention discussed;Falls evaluation completed      Data saved with a previous flowsheet row definition     [facility].  COPD - continues controller inhalers (Spiriva , Advair) through the [facility]. He's not regular with Spiriva . Uses albuterol  rescue inhaler about twice a week.   Persistent afib, failed DCCV 04/2020, continues eliquis  and metoprolol  bid.  Known nonobstructive CAD on atorvastatin .  Sees Dr Darliss, last 12/2023 - pending echo, lexiscan myoview due to worsening exertional dyspnea over last 9 months.   Multiple back procedures including posterior cervical disc fusion C1-2-3 04/2020 as well as bilateral L2/3 laminectomy/foraminotomy 06/2020 [facility]) followed by C6-T3 posterior spinal fusion (Kimbrough) 09/2021.   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   Preventative: Colonoscopy 09/23/2010 - small HPs, diverticula, hemorrhoids. Rpt 10 yrs [place]).  Colonoscopy 12/2020 - SSAx2, diverticulosis, rpt 7 yrs [facility])  Prostate - normal DRE/PSA in past. h/o BPH, s/p UroLift 10/2020 [place]) with significant improvement in obstructive symptoms.  Lung cancer screening - yearly since 2018, latest 11/2023.  AAA screen - poor study, no AAA noted (09/2016).  Flu shot yearly  Pfizer COVID 08/2019, 09/2019, Moderna booster 10/2020  Pneumovax - 2015, 2020, prevnar-13 2018, 2019 Tdap 2014 RSV 09/2022 Zostavax - 09/2013 Shingrix - 03/2022, 05/2022 Advanced directives: has at home. HCPOA is daughter, Camelia. He and wife decline bringing copy of living will for our records.  Seat belt use discussed.  Sunscreen use discussed. No changing moles on skin.  Smoking 2-3 cig/day. 1 pack lasts several weeks. Also uses nicorette gum.  Alcohol - 3-4 beers/day  Dentist q6 mo  Eye exam yearly through the [facility]  Bowel - no constipation  Bladder - no incontinence   Lives with wife. Has 1 son, 2 daughters.   Occupation: Event organiser. Activity: no regular exercise  Diet: gatorade, no water, fruits/vegetables daily      Relevant past  medical, surgical, family and social history reviewed and updated as indicated. Interim medical history since our last visit reviewed. Allergies and medications reviewed and updated. Outpatient Medications Prior to Visit  Medication Sig Dispense Refill   acetaminophen  (TYLENOL  8 HOUR) 650 MG CR tablet Take 1 tablet (650 mg total) by mouth in the morning and at bedtime.     Albuterol  Sulfate (PROAIR  RESPICLICK) 108 (90 Base) MCG/ACT AEPB Inhale 2 puffs into the lungs every 6 (six) hours as needed (cough, shortness of breath). 1 each 3   apixaban  (ELIQUIS ) 5 MG TABS tablet  Take 5 mg by mouth 2 (two) times daily.     fluticasone -salmeterol (WIXELA INHUB) 250-50 MCG/ACT AEPB Inhale 1 puff into the lungs daily.     lisinopril  (ZESTRIL ) 40 MG tablet Take 1 tablet (40 mg total) by mouth daily. 30 tablet 3   loratadine (CLARITIN) 10 MG tablet Take 10 mg by mouth as needed.     metoprolol  tartrate (LOPRESSOR ) 50 MG tablet Take 1 tablet (50 mg total) by mouth 2 (two) times daily. 60 tablet 3   nicotine  polacrilex (NICORETTE) 4 MG gum Take 4 mg by mouth as needed.     OVER THE COUNTER MEDICATION Take 2 tablets by mouth daily. sulfurzyme     sildenafil  (VIAGRA ) 50 MG tablet Take 50 mg by mouth daily as needed for erectile dysfunction. Taking 1/2 tablet PRN (from [facility])     tiotropium (SPIRIVA ) 18 MCG inhalation capsule Place 18 mcg into inhaler and inhale daily.     atorvastatin  (LIPITOR) 10 MG tablet TAKE 1 TABLET(10 MG) BY MOUTH DAILY 90 tablet 4   Cholecalciferol  25 MCG (1000 UT) tablet Take 1,000 Units by mouth daily. (Patient not taking: Reported on 01/16/2024)     No facility-administered medications prior to visit.     Per HPI unless specifically indicated in ROS section below Review of Systems  Constitutional:  Negative for activity change, appetite change, chills, fatigue, fever and unexpected weight change.  HENT:  Negative for hearing loss.   Eyes:  Negative for visual disturbance.  Respiratory:  Positive for cough (occ), shortness of breath (exertional) and wheezing (occ). Negative for chest tightness.   Cardiovascular:  Negative for chest pain, palpitations and leg swelling.  Gastrointestinal:  Negative for abdominal distention, abdominal pain, blood in stool, constipation, diarrhea, nausea and vomiting.  Genitourinary:  Negative for difficulty urinating and hematuria.  Musculoskeletal:  Negative for arthralgias, myalgias and neck pain.  Skin:  Negative for rash.  Neurological:  Negative for dizziness, seizures, syncope and headaches.  Hematological:   Negative for adenopathy. Bruises/bleeds easily.  Psychiatric/Behavioral:  Negative for dysphoric mood. The patient is not nervous/anxious.     Objective:  BP 138/82   Pulse 74   Temp 98.1 F (36.7 C) (Oral)   Ht 5' 4.25 (1.632 m)   Wt 206 lb 6 oz (93.6 kg)   SpO2 98%   BMI 35.15 kg/m   Wt Readings from Last 3 Encounters:  01/22/24 206 lb 6 oz (93.6 kg)  01/16/24 205 lb (93 kg)  01/11/24 205 lb 2 oz (93 kg)      Physical Exam Vitals and nursing note reviewed.  Constitutional:      General: He is not in acute distress.    Appearance: Normal appearance. He is well-developed. He is not ill-appearing.  HENT:     Head: Normocephalic and atraumatic.     Right Ear: Hearing, tympanic membrane, ear canal and external ear normal.  Left Ear: Hearing, tympanic membrane, ear canal and external ear normal. There is impacted cerumen.     Ears:     Comments: Notes some left sided hearing difficulty    Nose: Nose normal.     Mouth/Throat:     Mouth: Mucous membranes are moist.     Pharynx: Oropharynx is clear. No oropharyngeal exudate or posterior oropharyngeal erythema.  Eyes:     General: No scleral icterus.    Extraocular Movements: Extraocular movements intact.     Conjunctiva/sclera: Conjunctivae normal.     Pupils: Pupils are equal, round, and reactive to light.  Neck:     Thyroid : No thyroid  mass or thyromegaly.     Vascular: No carotid bruit.  Cardiovascular:     Rate and Rhythm: Normal rate. Rhythm irregularly irregular.     Pulses: Normal pulses.          Radial pulses are 2+ on the right side and 2+ on the left side.     Heart sounds: Normal heart sounds. No murmur heard. Pulmonary:     Effort: Pulmonary effort is normal. No respiratory distress.     Breath sounds: Normal breath sounds. No wheezing, rhonchi or rales.  Abdominal:     General: Bowel sounds are normal. There is no distension.     Palpations: Abdomen is soft. There is no mass.     Tenderness: There is  no abdominal tenderness. There is no guarding or rebound.     Hernia: No hernia is present.  Musculoskeletal:        General: Normal range of motion.     Cervical back: Normal range of motion and neck supple.     Right lower leg: No edema.     Left lower leg: No edema.  Lymphadenopathy:     Cervical: No cervical adenopathy.  Skin:    General: Skin is warm and dry.     Findings: No rash.  Neurological:     General: No focal deficit present.     Mental Status: He is alert and oriented to person, place, and time.  Psychiatric:        Mood and Affect: Mood normal.        Behavior: Behavior normal.        Thought Content: Thought content normal.        Judgment: Judgment normal.       Results for orders placed or performed in visit on 01/15/24  VITAMIN D  25 Hydroxy (Vit-D Deficiency, Fractures)   Collection Time: 01/15/24  7:58 AM  Result Value Ref Range   VITD 38.67 30.00 - 100.00 ng/mL  Hemoglobin A1c   Collection Time: 01/15/24  7:58 AM  Result Value Ref Range   Hgb A1c MFr Bld 5.9 4.6 - 6.5 %  Vitamin B1   Collection Time: 01/15/24  7:58 AM  Result Value Ref Range   Vitamin B1 (Thiamine ) 10 8 - 30 nmol/L  Folate   Collection Time: 01/15/24  7:58 AM  Result Value Ref Range   Folate 19.3 >5.9 ng/mL  Vitamin B12   Collection Time: 01/15/24  7:58 AM  Result Value Ref Range   Vitamin B-12 729 211 - 911 pg/mL  CBC with Differential/Platelet   Collection Time: 01/15/24  7:58 AM  Result Value Ref Range   WBC 4.6 4.0 - 10.5 K/uL   RBC 3.43 (L) 4.22 - 5.81 Mil/uL   Hemoglobin 12.1 (L) 13.0 - 17.0 g/dL   HCT 64.8 (L) 60.9 -  52.0 %   MCV 102.2 (H) 78.0 - 100.0 fl   MCHC 34.3 30.0 - 36.0 g/dL   RDW 86.3 88.4 - 84.4 %   Platelets 206.0 150.0 - 400.0 K/uL   Neutrophils Relative % 46.5 43.0 - 77.0 %   Lymphocytes Relative 38.8 12.0 - 46.0 %   Monocytes Relative 9.4 3.0 - 12.0 %   Eosinophils Relative 4.7 0.0 - 5.0 %   Basophils Relative 0.6 0.0 - 3.0 %   Neutro Abs 2.1 1.4  - 7.7 K/uL   Lymphs Abs 1.8 0.7 - 4.0 K/uL   Monocytes Absolute 0.4 0.1 - 1.0 K/uL   Eosinophils Absolute 0.2 0.0 - 0.7 K/uL   Basophils Absolute 0.0 0.0 - 0.1 K/uL  PSA   Collection Time: 01/15/24  7:58 AM  Result Value Ref Range   PSA 1.88 0.10 - 4.00 ng/mL  Lipid panel   Collection Time: 01/15/24  7:58 AM  Result Value Ref Range   Cholesterol 172 0 - 200 mg/dL   Triglycerides 54.9 0.0 - 149.0 mg/dL   HDL 14.09 >60.99 mg/dL   VLDL 9.0 0.0 - 59.9 mg/dL   LDL Cholesterol 77 0 - 99 mg/dL   Total CHOL/HDL Ratio 2    NonHDL 86.20   Comprehensive metabolic panel with GFR   Collection Time: 01/15/24  7:58 AM  Result Value Ref Range   Sodium 133 (L) 135 - 145 mEq/L   Potassium 4.9 3.5 - 5.1 mEq/L   Chloride 98 96 - 112 mEq/L   CO2 27 19 - 32 mEq/L   Glucose, Bld 114 (H) 70 - 99 mg/dL   BUN 12 6 - 23 mg/dL   Creatinine, Ser 9.14 0.40 - 1.50 mg/dL   Total Bilirubin 0.5 0.2 - 1.2 mg/dL   Alkaline Phosphatase 81 39 - 117 U/L   AST 30 0 - 37 U/L   ALT 43 0 - 53 U/L   Total Protein 6.7 6.0 - 8.3 g/dL   Albumin  4.5 3.5 - 5.2 g/dL   GFR 13.20 >39.99 mL/min   Calcium  9.4 8.4 - 10.5 mg/dL    Assessment & Plan:   Problem List Items Addressed This Visit     Advanced care planning/counseling discussion (Chronic)   Previously discussed.       Health maintenance examination - Primary (Chronic)   Preventative protocols reviewed and updated unless pt declined. Discussed healthy diet and lifestyle.       HLD (hyperlipidemia)   Chronic, LDL 77 above goal. Will await pending lexiscan/echo  The 10-year ASCVD risk score (Arnett DK, et al., 2019) is: 40.6%   Values used to calculate the score:     Age: 72 years     Clincally relevant sex: Male     Is Non-Hispanic African American: No     Diabetic: Yes     Tobacco smoker: Yes     Systolic Blood Pressure: 138 mmHg     Is BP treated: Yes     HDL Cholesterol: 85.9 mg/dL     Total Cholesterol: 172 mg/dL       Relevant Medications    atorvastatin  (LIPITOR) 10 MG tablet   Essential hypertension   Chronic, stable. Continue current regimen including higher lisinopril  40mg  daily.       Relevant Medications   atorvastatin  (LIPITOR) 10 MG tablet   COPD (chronic obstructive pulmonary disease) (HCC)   Chronic, ongoing smoking.  Discussed controller inhaler and rescue inhaler use.  He was not taking regular spiriva  -  will restart this along with advair BID.       Prediabetes   Continue to monitor, encouraged limiting added sugar in diet.       Vitamin D  deficiency   Rec restart 1000 units daily OTC       Severe obesity (BMI 35.0-39.9) with comorbidity (HCC)   Encourage healthy diet and lifestyle choices to affect sustainable weight loss. Obesity complicated by comorbidities of prediabetes, HLD, HTN, OA       Habitual alcohol use   Encouraged limiting alcohol use.       Tobacco use   Continued minimal daily smoking. Precontemplative.  Continue yearly lung screening CT.       CAD (coronary artery disease)   Relevant Medications   atorvastatin  (LIPITOR) 10 MG tablet   Kidney cyst, acquired   Benign on latest imaging - no f/u needed.      Macrocytic anemia   Presumed due to alcohol use. Recent vitamin levels stable - suggested intermittent thiamine  supplementation due to low normal levels.       Persistent atrial fibrillation (HCC)   Appreciate cardiology care on eliquis  and metoprolol       Relevant Medications   atorvastatin  (LIPITOR) 10 MG tablet   Hearing loss of left ear due to cerumen impaction   Subjective hearing difficulty L>R with cerumen impaction noted on exam today.  Left ear irrigation performed today successfully with resolution.         Meds ordered this encounter  Medications   atorvastatin  (LIPITOR) 10 MG tablet    Sig: TAKE 1 TABLET(10 MG) BY MOUTH DAILY    Dispense:  90 tablet    Refill:  3    No orders of the defined types were placed in this encounter.   Patient  Instructions  Left ear irrigation today  Restart daily spiriva  controller inhaler along with Advair twice daily.  Pending results of upcoming hear tests, we may increase atorvastatin  dose.  For now, atorvastatin  10mg  refilled to pharmacy Continue current medicines. Good to see you today  Return as needed or in 1 year for next physical / wellness visit  Follow up plan: Return in about 1 year (around 01/21/2025) for annual exam, prior fasting for blood work, medicare wellness visit.  Anton Blas, MD

## 2024-01-22 NOTE — Patient Instructions (Addendum)
 Left ear irrigation today  Restart daily spiriva  controller inhaler along with Advair twice daily.  Pending results of upcoming hear tests, we may increase atorvastatin  dose.  For now, atorvastatin  10mg  refilled to pharmacy Continue current medicines. Good to see you today  Return as needed or in 1 year for next physical / wellness visit

## 2024-01-22 NOTE — Assessment & Plan Note (Signed)
 Previously discussed.

## 2024-01-22 NOTE — Assessment & Plan Note (Signed)
 Continued minimal daily smoking. Precontemplative.  Continue yearly lung screening CT.

## 2024-01-22 NOTE — Assessment & Plan Note (Signed)
 Chronic, LDL 77 above goal. Will await pending lexiscan/echo  The 10-year ASCVD risk score (Arnett DK, et al., 2019) is: 40.6%   Values used to calculate the score:     Age: 73 years     Clincally relevant sex: Male     Is Non-Hispanic African American: No     Diabetic: Yes     Tobacco smoker: Yes     Systolic Blood Pressure: 138 mmHg     Is BP treated: Yes     HDL Cholesterol: 85.9 mg/dL     Total Cholesterol: 172 mg/dL

## 2024-01-22 NOTE — Assessment & Plan Note (Signed)
 Encourage healthy diet and lifestyle choices to affect sustainable weight loss. Obesity complicated by comorbidities of prediabetes, HLD, HTN, OA

## 2024-01-22 NOTE — Assessment & Plan Note (Signed)
 Presumed due to alcohol use. Recent vitamin levels stable - suggested intermittent thiamine  supplementation due to low normal levels.

## 2024-01-22 NOTE — Assessment & Plan Note (Signed)
Encouraged limiting alcohol use.

## 2024-01-22 NOTE — Assessment & Plan Note (Signed)
 Chronic, stable. Continue current regimen including higher lisinopril  40mg  daily.

## 2024-01-22 NOTE — Assessment & Plan Note (Signed)
 Preventative protocols reviewed and updated unless pt declined. Discussed healthy diet and lifestyle.

## 2024-01-25 ENCOUNTER — Ambulatory Visit: Attending: Cardiology

## 2024-01-25 DIAGNOSIS — I4819 Other persistent atrial fibrillation: Secondary | ICD-10-CM | POA: Diagnosis not present

## 2024-01-26 LAB — ECHOCARDIOGRAM COMPLETE
AR max vel: 3.21 cm2
AV Area VTI: 3.42 cm2
AV Area mean vel: 2.95 cm2
AV Mean grad: 2 mmHg
AV Peak grad: 2.9 mmHg
Ao pk vel: 0.86 m/s
Area-P 1/2: 5.27 cm2
Calc EF: 51.1 %
S' Lateral: 3.1 cm
Single Plane A2C EF: 50.1 %
Single Plane A4C EF: 51.5 %

## 2024-01-27 ENCOUNTER — Ambulatory Visit: Payer: Self-pay | Admitting: Cardiology

## 2024-01-30 ENCOUNTER — Ambulatory Visit
Admission: RE | Admit: 2024-01-30 | Discharge: 2024-01-30 | Disposition: A | Discharge status: Home / Self Care | Source: Ambulatory Visit | Attending: Cardiology | Admitting: Cardiology

## 2024-01-30 ENCOUNTER — Other Ambulatory Visit: Payer: Self-pay | Admitting: Physician Assistant

## 2024-01-30 DIAGNOSIS — R0609 Other forms of dyspnea: Secondary | ICD-10-CM | POA: Insufficient documentation

## 2024-01-30 DIAGNOSIS — I2089 Other forms of angina pectoris: Secondary | ICD-10-CM | POA: Diagnosis not present

## 2024-01-30 LAB — NM MYOCAR MULTI W/SPECT W/WALL MOTION / EF
LV dias vol: 97 mL (ref 62–150)
LV sys vol: 39 mL (ref 4.2–5.8)
MPHR: 148 {beats}/min
Nuc Stress EF: 60 %
Peak HR: 104 {beats}/min
Percent HR: 70 %
Rest HR: 77 {beats}/min
Rest Nuclear Isotope Dose: 10.5 mCi
SDS: 2
SRS: 0
SSS: 2
ST Depression (mm): 0 mm
Stress Nuclear Isotope Dose: 29.4 mCi
TID: 1.03

## 2024-01-30 MED ORDER — TECHNETIUM TC 99M TETROFOSMIN IV KIT
10.4800 | PACK | Freq: Once | INTRAVENOUS | Status: AC | PRN
  Administered 2024-01-30: 10.48 via INTRAVENOUS

## 2024-01-30 MED ORDER — REGADENOSON 0.4 MG/5ML IV SOLN
0.4000 mg | Freq: Once | INTRAVENOUS | Status: AC
  Administered 2024-01-30: 0.4 mg via INTRAVENOUS

## 2024-01-30 MED ORDER — TECHNETIUM TC 99M TETROFOSMIN IV KIT
29.3700 | PACK | Freq: Once | INTRAVENOUS | Status: AC | PRN
  Administered 2024-01-30: 29.37 via INTRAVENOUS

## 2024-01-30 NOTE — Progress Notes (Signed)
     [place] JAYSON Gleason presented for a nuclear stress test today.  I Lesley LITTIE Maffucci, PA-C, provided direct supervision and was present during the stress portion of the study today, which was completed without significant symptoms, immediate complications, or acute ST/T changes on ECG.  Stress imaging is pending at this time.  Preliminary ECG findings may be listed in the chart, but the stress test result will not be finalized until perfusion imaging is complete.  Lesley LITTIE Maffucci, PA-C  01/30/2024, 9:03 AM

## 2024-02-05 ENCOUNTER — Telehealth: Payer: Self-pay | Admitting: Cardiology

## 2024-02-05 NOTE — Telephone Encounter (Signed)
Pt calling for stress test results °

## 2024-02-08 ENCOUNTER — Other Ambulatory Visit: Payer: Self-pay | Admitting: Family Medicine

## 2024-02-08 DIAGNOSIS — E7800 Pure hypercholesterolemia, unspecified: Secondary | ICD-10-CM

## 2024-02-13 ENCOUNTER — Other Ambulatory Visit: Payer: Self-pay | Admitting: Family Medicine

## 2024-02-13 ENCOUNTER — Other Ambulatory Visit: Payer: Self-pay

## 2024-02-13 ENCOUNTER — Telehealth: Payer: Self-pay

## 2024-02-13 DIAGNOSIS — M7051 Other bursitis of knee, right knee: Secondary | ICD-10-CM

## 2024-02-22 ENCOUNTER — Ambulatory Visit: Admitting: Physician Assistant

## 2024-02-22 ENCOUNTER — Encounter: Payer: Self-pay | Admitting: Physician Assistant

## 2024-02-22 DIAGNOSIS — M25562 Pain in left knee: Secondary | ICD-10-CM | POA: Diagnosis not present

## 2024-02-22 DIAGNOSIS — G8929 Other chronic pain: Secondary | ICD-10-CM | POA: Diagnosis not present

## 2024-02-22 DIAGNOSIS — M1712 Unilateral primary osteoarthritis, left knee: Secondary | ICD-10-CM | POA: Diagnosis not present

## 2024-02-22 MED ORDER — HYALURONAN 88 MG/4ML IX SOSY
88.0000 mg | PREFILLED_SYRINGE | INTRA_ARTICULAR | Status: AC | PRN
  Administered 2024-02-22: 88 mg via INTRA_ARTICULAR

## 2024-02-22 NOTE — Progress Notes (Signed)
   Procedure Note  Patient: [name]             Date of Birth: [birth year]           MRN: [id number]             Visit Date: 02/22/2024 HPI: [name] comes in today for scheduled left knee Monovisc injection.  He has known osteoarthritis of the knee.  He is unable to take NSAIDs due to the fact that he is on chronic Eliquis .  He has no scheduled surgery in the next 6 months.  He has tried Tylenol  and cortisone injections for the left knee pain without significant relief.  Physical exam: Bilateral knees no abnormal warmth erythema or effusion.  Right knee well-healed surgical incision from right total knee arthroplasty.  Tenderness over the pes anserinus region right knee only.  Left knee good range of motion no instability.  Procedures: Visit Diagnoses:  1. Chronic pain of left knee   2. Arthritis of knee, left     Large Joint Inj: L knee on 02/22/2024 3:34 PM Indications: pain Details: 22 G 1.5 in needle, anterolateral approach  Arthrogram: No  Medications: 88 mg Hyaluronan 88 MG/4ML Outcome: tolerated well, no immediate complications Procedure, treatment alternatives, risks and benefits explained, specific risks discussed. Consent was given by the patient. Immediately prior to procedure a time out was called to verify the correct patient, procedure, equipment, support staff and site/side marked as required. Patient was prepped and draped in the usual sterile fashion.    Plan: He understands that he could have second peds in injection in the right knee in a month.  In regards to the viscosupplementation injections these are every 6 months.  Follow-up with us  as needed.  Questions were encouraged and answered.

## 2024-03-27 ENCOUNTER — Ambulatory Visit: Admitting: Physician Assistant

## 2024-03-27 ENCOUNTER — Encounter: Payer: Self-pay | Admitting: Physician Assistant

## 2024-03-27 DIAGNOSIS — M7051 Other bursitis of knee, right knee: Secondary | ICD-10-CM

## 2024-03-27 MED ORDER — METHYLPREDNISOLONE ACETATE 40 MG/ML IJ SUSP
40.0000 mg | INTRAMUSCULAR | Status: AC | PRN
  Administered 2024-03-27: 40 mg via INTRAMUSCULAR

## 2024-03-27 MED ORDER — LIDOCAINE HCL 1 % IJ SOLN
1.0000 mL | INTRAMUSCULAR | Status: AC | PRN
  Administered 2024-03-27: 1 mL

## 2024-03-27 NOTE — Progress Notes (Signed)
   Procedure Note  Patient: [name]             Date of Birth: Dec 22, 1950           MRN: [id number]             Visit Date: 03/27/2024 HPI: [name] comes in today follow-up of his left knee.  He states that the left knee is giving him viscosupplementation injection on 02/22/2024 and is doing well.  He is wanting injections in his right knee.  History of right total knee arthroplasty.  No new injury.  Tenderness anterior medial aspect of the knee.  Physical exam: Right knee good range of motion.  No instability.  No abnormal warmth erythema and surgical incisions well-healed.  Tenderness over the pes anserinus region.  Procedures: Visit Diagnoses:  1. Pes anserinus bursitis of right knee     Trigger Point Inj  Date/Time: 03/27/2024 9:50 AM  Performed by: Gretta Bertrum ORN, PA-C Authorized by: Gretta Bertrum ORN, PA-C   Total # of Trigger Points:  1 Location: lower extremity   Needle Size:  25 G Medications #1:  1 mL lidocaine  1 %; 40 mg methylPREDNISolone  acetate 40 MG/ML    Plan: Did suggest he work on hamstring stretching and quad strengthening.  Offered formal therapy he defers.  Therefore handouts were given and reviewed with him.  He will follow-up with us  as needed

## 2024-04-12 ENCOUNTER — Encounter: Payer: Self-pay | Admitting: Cardiology

## 2024-04-12 ENCOUNTER — Ambulatory Visit: Attending: Cardiology | Admitting: Cardiology

## 2024-04-12 VITALS — BP 132/78 | HR 71 | Ht 66.0 in | Wt 209.2 lb

## 2024-04-12 DIAGNOSIS — I2510 Atherosclerotic heart disease of native coronary artery without angina pectoris: Secondary | ICD-10-CM

## 2024-04-12 DIAGNOSIS — I10 Essential (primary) hypertension: Secondary | ICD-10-CM

## 2024-04-12 DIAGNOSIS — I4819 Other persistent atrial fibrillation: Secondary | ICD-10-CM | POA: Diagnosis not present

## 2024-04-12 DIAGNOSIS — F17200 Nicotine dependence, unspecified, uncomplicated: Secondary | ICD-10-CM

## 2024-04-12 DIAGNOSIS — R0609 Other forms of dyspnea: Secondary | ICD-10-CM

## 2024-04-12 NOTE — Patient Instructions (Signed)

## 2024-04-12 NOTE — Progress Notes (Signed)
 Cardiology Office Note:    Date:  04/12/2024   ID:  LINDA BIEHN, DOB 1951-03-07, MRN [id number]  PCP:  Rilla Baller, MD  [facility] Cardiologist:  Redell Cave, MD  [facility] Electrophysiologist:  None   Referring MD: Rilla Baller, MD   Chief Complaint  Patient presents with   Follow-up    3 month follow up visit. Following up post testing. Patient states that he experiences shortness of breath all the time, but he feels good. Meds reviewewd.    History of Present Illness:    [name] is a 73 y.o. male with a hx of persistent A. fib on Eliquis , nonobstructive CAD (LHC 2016 LAD 30%, RCA 20%), hypertension, hyperlipidemia,  COPD, current smoker x40+ years, DDD, who presents for follow-up.   Previously seen due to symptoms of dyspnea, Lexiscan  Myoview , echocardiogram was obtained to evaluate any significant abnormalities.  Lisinopril  increased to 40 mg daily for better BP control.  Overall feels well, tolerating medications as prescribed, still with occasional shortness of breath.  Has not had a pulmonary evaluation for some time now.  He still smokes.  Prior notes  patient with history of neck pains, back pains with radiculopathy.  Diagnosed with cervical spinal stenosis.  Laminectomy is being planned.  Saw primary care provider for preop exam, EKG noted to be in atrial fibrillation rapid ventricular response.  He was started on Eliquis  and Lopressor .  Echo 03/2020 showed low normal systolic function, EF 50%. DC cardioversion was attempted on 05/06/2020 unsuccessfully. DC cardioversion attempt on 04/2020 was unsuccessful.  Evaluated by EP, deemed not a good candidate for follow-up ablation given history of COPD and ongoing smoker.     Past Medical History:  Diagnosis Date   Allergic rhinitis 07/1998   Aortic atherosclerosis 09/17/2016   Arthritis    BACK AND RIGHT KNEE   CAD (coronary artery disease) 07/01/2015   a.) LHC 07/01/2015: normal LV  function; 30% p-mLAD, 20% p-mRCA; intervention deferred opting for medical mgmt.   COPD (chronic obstructive pulmonary disease) (HCC) 07/1998   centrilobular emphysema (09/2016) w ongoing tobacco use, spirometry (02/2013)   DDD (degenerative disc disease), cervical    DOE (dyspnea on exertion)    Erectile dysfunction    a.) on PDE5i (sildenafil )   ETOH abuse    a.) consumes 4-5 beers on a daily basis   History of exposure to asbestos    HTN (hypertension) 05/1998   Hyperlipidemia 06/1996   Hypertensive retinopathy    Long term current use of anticoagulant    a.) apixaban    Lumbar spinal stenosis 03/2013   a.) s/p laminectomy   Macrocytic anemia    Migraines    Peripheral vascular disease    Persistent atrial fibrillation (HCC)    a.) CHA2DS2-VASc = 3 (age, HTN, aortic plaque). 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   Smoker     Past Surgical History:  Procedure Laterality Date   ANTERIOR CERVICAL DECOMP/DISCECTOMY FUSION  2003   C3-7   APPLICATION OF INTRAOPERATIVE CT SCAN N/A 10/11/2021   Procedure: APPLICATION OF INTRAOPERATIVE CT SCAN;  Surgeon: Clois Fret, MD;  Location: [facility] ORS;  Service: Neurosurgery;  Laterality: N/A;   BACK SURGERY  2007   CARDIAC CATHETERIZATION N/A 07/01/2015  Procedure: Left Heart Cath and Coronary Angiography;  Surgeon: Deatrice DELENA Cage, MD;  Location: [facility];  Service: Cardiovascular;  Laterality: N/A;   CARDIOVERSION N/A 05/06/2020   Procedure: CARDIOVERSION;  Surgeon: Darliss Rogue, MD;  Location: [facility];  Service: Cardiovascular;  Laterality: N/A;   COLONOSCOPY  09/2010   HPx2, diverticula, hemorrhoids, rpt 10 yrs [place])   COLONOSCOPY  01/07/2021   SSAx2, diverticulosis, rpt 7  yrs ([place])   CYSTOSCOPY WITH INSERTION OF UROLIFT N/A 11/02/2020   Procedure: CYSTOSCOPY WITH INSERTION OF UROLIFT;  Surgeon: Penne Knee, MD;  Location: [facility];  Service: Urology;  Laterality: N/A;   ETT  2010   normal, ABIs normal   HIP SURGERY Right 08/2022   R lateral femoral cutaneous nerve decompression/transposition   KNEE ARTHROSCOPY Right 2011   [place]lage, ligaments   KNEE ARTHROSCOPY Left 1984   LUMBAR LAMINECTOMY  06/2020   Bilateral L2-3 laminectomy [place])   LUMBAR LAMINECTOMY/DECOMPRESSION MICRODISCECTOMY Left 03/27/2013   Procedure: LUMBAR LAMINECTOMY/DECOMPRESSION MICRODISCECTOMY 2 LEVELS;  Surgeon: Reyes JONETTA Budge, MD;  Location: [facility];  Service: Neurosurgery;  Laterality: Left;  LEFT Lumbar Three Four diskectomy with Lumbar Three-Four, Four-Five Laminectomy. L3/4 ahd L4/5 for LSS [place])    LUMBAR LAMINECTOMY/DECOMPRESSION MICRODISCECTOMY Bilateral 07/15/2020   Procedure: LAMINECTOMY AND FORAMINOTOMY BILATERAL LUMBAR TWO-LUMBAR THREE;  Surgeon: Budge Reyes, MD;  Location: [facility] OR;  Service: Neurosurgery;  Laterality: Bilateral;   POSTERIOR CERVICAL FUSION/FORAMINOTOMY N/A 05/14/2020   Procedure: LAMINECTOMY, POSTERIOR INSTRUMENTATION AND FUSION, CERVICAL ONE- CERVICAL TWO, CERVICAL TWO- CERVICAL THREE;  Surgeon: Budge Reyes, MD;  Location: [facility] OR;  Service: Neurosurgery;  Laterality: N/A;   POSTERIOR CERVICAL FUSION/FORAMINOTOMY N/A 10/11/2021   Procedure: C6-T3 POSTERIOR FUSION;  Surgeon: Clois Fret, MD;  Location: [facility];  Service: Neurosurgery;  Laterality: N/A;   POSTERIOR CERVICAL LAMINECTOMY N/A 10/11/2021   Procedure: C6-T2 POSTERIOR SPINAL DECOMPRESSION;  Surgeon: Clois Fret, MD;  Location: [facility];  Service: Neurosurgery;  Laterality: N/A;   SHOULDER ARTHROSCOPY Right 2011   spriometry  02/2013   COPD with some restriction thought to be obesity related ([place])   TOTAL KNEE ARTHROPLASTY Right 06/27/2014   Procedure:  RIGHT TOTAL KNEE ARTHROPLASTY;  Surgeon: Lonni CINDERELLA Poli, MD;  Location: [facility];  Service: Orthopedics;  Laterality: Right;   US  ECHOCARDIOGRAPHY  01/2013   WNL, mild diastolic dysfunction    Current Medications: Current Meds  Medication Sig   acetaminophen  (TYLENOL  8 HOUR) 650 MG CR tablet Take 1 tablet (650 mg total) by mouth in the morning and at bedtime.   Albuterol  Sulfate (PROAIR  RESPICLICK) 108 (90 Base) MCG/ACT AEPB Inhale 2 puffs into the lungs every 6 (six) hours as needed (cough, shortness of breath).   apixaban  (ELIQUIS ) 5 MG TABS tablet Take 5 mg by mouth 2 (two) times daily.   atorvastatin  (LIPITOR) 10 MG tablet TAKE 1 TABLET(10 MG) BY MOUTH DAILY   fluticasone -salmeterol (WIXELA INHUB) 250-50 MCG/ACT AEPB Inhale 1 puff into the lungs daily.   lisinopril  (ZESTRIL ) 40 MG tablet Take 1 tablet (40 mg total) by mouth daily.   loratadine (CLARITIN) 10 MG tablet Take 10 mg by mouth as needed.   metoprolol  tartrate (LOPRESSOR ) 50 MG tablet Take 1 tablet (50 mg total) by mouth 2 (two) times daily.   nicotine  polacrilex (NICORETTE) 4 MG gum Take 4 mg by mouth as needed.   sildenafil  (VIAGRA ) 50 MG tablet Take 50 mg by mouth daily as needed for erectile dysfunction. Taking 1/2 tablet PRN (from [facility])   tiotropium (SPIRIVA ) 18 MCG inhalation capsule Place 18 mcg into inhaler and inhale daily.     Allergies:   Varenicline    Social History   Socioeconomic History   Marital status: Married    Spouse name: Not on file   Number of children: Not on file   Years of education: Not on file   Highest education level: Not on file  Occupational History   Not on file  Tobacco Use   Smoking status: Some Days    Current packs/day: 0.50    Average packs/day: 0.5 packs/day for 40.0 years (20.0 ttl pk-yrs)    Types: Cigarettes   Smokeless tobacco: Never   Tobacco comments:    3 cigarattes over the past 3 weeks as of 10-06-21  Vaping Use   Vaping status: Never Used  Substance  and Sexual Activity   Alcohol use: Yes    Alcohol/week: 28.0 standard drinks of alcohol    Types: 28 Cans of beer per week    Comment: 4-5 beers daily   Drug use: No   Sexual activity: Not Currently  Other Topics Concern   Not on file  Social History Narrative   Lives with wife. Has 1 son, 2 daughters.    Occupation: Event organiser.    Activity: no exercise   Diet: good water, fruits/vegetables daily      Screened positive for OSA at recent hospitalization (03/2013)   Social Drivers of Health   Financial Resource Strain: Low Risk  (01/16/2024)   Overall Financial Resource Strain (CARDIA)    Difficulty of Paying Living Expenses: Not very hard  Food Insecurity: No Food Insecurity (01/16/2024)   Hunger Vital Sign    Worried About Running Out of Food in the Last Year: Never true    Ran Out of Food in the Last Year: Never true  Transportation Needs: No Transportation Needs (01/16/2024)   PRAPARE - Administrator, Civil Service (Medical): No    Lack of Transportation (Non-Medical): No  Physical Activity: Insufficiently Active (01/16/2024)   Exercise Vital Sign    Days of Exercise per Week: 3 days    Minutes of Exercise per Session: 30 min  Stress: No Stress Concern Present (01/16/2024)   [employer] of Occupational Health - Occupational Stress Questionnaire    Feeling of Stress: Not at all  Social Connections: Socially Integrated (01/16/2024)   Social Connection and Isolation Panel    Frequency of Communication with Friends and Family: More than three times a week    Frequency of Social Gatherings with Friends and Family: More than three times a week    Attends Religious Services: More than 4 times per year    Active Member of [employer] or Organizations: Yes    Attends Engineer, structural: More than 4 times per year    Marital Status: Married     Family History: The patient's family history includes Coronary artery disease in his father and mother; Diabetes in  his father; Hypertension in his father and mother; Irregular heart beat in his brother; Maple syrup urine disease in his brother; Pneumonia in his sister; Stroke in his father and mother. There is no history of Cancer.  ROS:   Please see the history of present illness.     All other systems reviewed and are negative.  EKGs/Labs/Other Studies Reviewed:    The following studies were reviewed today:   EKG:EKG Interpretation Date/Time:  Friday April 12 2024 09:56:40 EDT Ventricular Rate:  71 PR Interval:    QRS Duration:  90 QT Interval:  378 QTC Calculation: 410 R Axis:   29  Text Interpretation: Atrial fibrillation ST & T wave abnormality, consider anterior ischemia Confirmed by Darliss Rogue [phone]) on 04/12/2024 10:10:02 AM    Recent Labs: 01/15/2024: ALT 43; BUN 12; Creatinine, Ser 0.85; Hemoglobin 12.1; Platelets 206.0; Potassium 4.9; Sodium 133  Recent Lipid Panel    Component Value Date/Time   CHOL 172 01/15/2024 0758   TRIG 45.0 01/15/2024 0758   HDL 85.90 01/15/2024 0758   CHOLHDL 2 01/15/2024 0758   VLDL 9.0 01/15/2024 0758   LDLCALC 77 01/15/2024 0758   LDLDIRECT 82.0 09/07/2016 0900    Physical Exam:    VS:  BP 132/78   Pulse 71   Ht 5' 6 (1.676 m)   Wt 209 lb 3.2 oz (94.9 kg)   SpO2 98%   BMI 33.77 kg/m     Wt Readings from Last 3 Encounters:  04/12/24 209 lb 3.2 oz (94.9 kg)  01/22/24 206 lb 6 oz (93.6 kg)  01/16/24 205 lb (93 kg)     GEN:  Well nourished, well developed in no acute distress HEENT: Normal NECK: No JVD; No carotid bruits CARDIAC: Irregular irregular, no murmurs, rubs, gallops RESPIRATORY: Decreased breath sounds, no wheezing ABDOMEN: Soft, non-tender, non-distended MUSCULOSKELETAL:  No edema; No deformity  SKIN: Warm and dry NEUROLOGIC:  Alert and oriented x 3 PSYCHIATRIC:  Normal affect   ASSESSMENT:    1. Dyspnea on exertion   2. Persistent atrial fibrillation (HCC)   3. Coronary artery disease involving native  coronary artery of native heart without angina pectoris   4. Primary hypertension   5. Current smoker    PLAN:    In order of problems listed above:  Dyspnea on exertion, Lexiscan  Myoview  7/25 no significant ischemia, low risk study.  Echo 7/25 EF 60 to 65%.  COPD, chronic smoking likely contributing to symptoms obesity.  Referr to pulmonary medicine. persistent atrial fibrillation. Failed DCCV on 04/2020 CHA2DS2-VASc score 3(age, htn, vasc).  Clinically asymptomatic, heart rate controlled.   Continue Lopressor  50 mg twice daily, Eliquis  5 mg twice daily. nonobstructive CAD (30% mLAD, 20% mRCA).  Asymptomatic. Continue Eliquis , Lipitor. Hypertension, BP controlled.  Continue lisinopril  40 mg daily, lopressor  50 mg twice daily. Current smoker, smoking cessation again recommended.  Follow-up in 12 months.       Medication Adjustments/Labs and Tests Ordered: Current medicines are reviewed at length with the patient today.  Concerns regarding medicines are outlined above.  Orders Placed This Encounter  Procedures   Ambulatory referral to Pulmonology   EKG 12-Lead    No orders of the defined types were placed in this encounter.    Patient Instructions  Medication Instructions:  Your physician recommends that you continue on your current medications as directed. Please refer to the Current Medication list given to you today.   *If you need a refill on your cardiac medications before your next appointment, please call your pharmacy*  Lab Work: No labs ordered today  If you have labs (blood work) drawn today and your tests are completely normal, you will receive your results only by: MyChart Message (if you have MyChart) OR A paper copy in the mail If you have any lab test that is abnormal or we need to change your treatment, we will call you to review the results.  Testing/Procedures: No test ordered today   Follow-Up: At [facility], you and  your health needs are our  priority.  As part of our continuing mission to provide you with exceptional heart care, our providers are all part of one team.  This team includes your primary Cardiologist (physician) and Advanced Practice Providers or APPs (Physician Assistants and Nurse Practitioners) who all work together to provide you with the care you need, when you need it.  Your next appointment:   1 year(s)  Provider:   You may see Redell Cave, MD or one of the following Advanced Practice Providers on your designated Care Team:   Lonni Meager, NP Lesley Maffucci, PA-C Bernardino Bring, PA-C Cadence Fairfield, PA-C Tylene Lunch, NP Barnie Hila, NP    We recommend signing up for the patient portal called MyChart.  Sign up information is provided on this After Visit Summary.  MyChart is used to connect with patients for Virtual Visits (Telemedicine).  Patients are able to view lab/test results, encounter notes, upcoming appointments, etc.  Non-urgent messages can be sent to your provider as well.   To learn more about what you can do with MyChart, go to [email or web address].             Signed, Redell Cave, MD  04/12/2024 10:41 AM    [facility]

## 2024-05-06 ENCOUNTER — Other Ambulatory Visit: Payer: Self-pay | Admitting: Cardiology

## 2024-05-06 DIAGNOSIS — F17200 Nicotine dependence, unspecified, uncomplicated: Secondary | ICD-10-CM

## 2024-05-16 ENCOUNTER — Encounter: Payer: Self-pay | Admitting: Student in an Organized Health Care Education/Training Program

## 2024-05-16 ENCOUNTER — Ambulatory Visit: Admitting: Student in an Organized Health Care Education/Training Program

## 2024-05-16 VITALS — BP 138/82 | HR 78 | Temp 98.1°F | Ht 66.0 in | Wt 211.4 lb

## 2024-05-16 DIAGNOSIS — J42 Unspecified chronic bronchitis: Secondary | ICD-10-CM | POA: Diagnosis not present

## 2024-05-16 DIAGNOSIS — I5030 Unspecified diastolic (congestive) heart failure: Secondary | ICD-10-CM

## 2024-05-16 DIAGNOSIS — F17200 Nicotine dependence, unspecified, uncomplicated: Secondary | ICD-10-CM

## 2024-05-16 DIAGNOSIS — R0602 Shortness of breath: Secondary | ICD-10-CM | POA: Diagnosis not present

## 2024-05-16 DIAGNOSIS — F17210 Nicotine dependence, cigarettes, uncomplicated: Secondary | ICD-10-CM

## 2024-05-16 DIAGNOSIS — J432 Centrilobular emphysema: Secondary | ICD-10-CM | POA: Diagnosis not present

## 2024-05-16 MED ORDER — SPIRIVA RESPIMAT 2.5 MCG/ACT IN AERS: 2.0000 | INHALATION_SPRAY | Freq: Every day | RESPIRATORY_TRACT | 12 refills | Status: DC

## 2024-05-16 MED ORDER — FLUTICASONE-SALMETEROL 250-50 MCG/ACT IN AEPB
1.0000 | INHALATION_SPRAY | Freq: Two times a day (BID) | RESPIRATORY_TRACT | 12 refills | Status: DC
Start: 2024-05-16 — End: 2024-05-17

## 2024-05-16 NOTE — Telephone Encounter (Signed)
 SABRA

## 2024-05-16 NOTE — Progress Notes (Signed)
 Assessment & Plan:   #Chronic bronchitis #Shortness of breath   #Chronic obstructive pulmonary disease (COPD)   #HFpEF #Afib  He experiences shortness of breath on exertion, particularly with minimal activity accompanied by an occasional cough with phlegm. Differential diagnosis for his shortness of breath includes COPD and HFpEF (LVEDP 24). His chest CT shows emphysema as well as areas of chronic atelectasis/scarring in the lug bases and in the lingula, which could be related to previous infection or his exposure to asbestos.   He is being treated empirically for COPD with Wixela and Spiriva  which provide some relief, but adherence to Spiriva  is inconsistent. Instructed to use Wixela one puff twice daily and Spiriva  two puffs once daily. Switch Spiriva  from capsule to mist form (respimat) for easier use. Will send prescription refills for Wixela and Spiriva  to [facility].  Will also order a pulmonary function test to confirm COPD and assess lung function. Will also coordinate with his cardiologist Dr. Daleen to evaluate heart pressures via RHC  - fluticasone -salmeterol (WIXELA INHUB) 250-50 MCG/ACT AEPB; Inhale 1 puff into the lungs in the morning and at bedtime.  Dispense: 90 each; Refill: 12 - Tiotropium Bromide  (SPIRIVA  RESPIMAT) 2.5 MCG/ACT AERS; Inhale 2 puffs into the lungs daily.  Dispense: 4 g; Refill: 12 - Pulmonary Function Test; Future - Will reach out to cardiologist regarding RHC  #Tobacco use disorder    He is a current smoker, now smoking 3-4 cigarettes per day, previously up to half a pack. Recommended full smoking cessation.    Return in about 3 months (around 08/16/2024).  Pedro November, MD [facility]  I spent 60 minutes caring for this patient today, including preparing to see the patient, obtaining a medical history , reviewing a separately obtained history, performing a medically appropriate examination and/or  evaluation, counseling and educating the patient/family/caregiver, ordering medications, tests, or procedures, documenting clinical information in the electronic health record, and independently interpreting results (not separately reported/billed) and communicating results to the patient/family/caregiver  End of visit medications:  Meds ordered this encounter  Medications   fluticasone -salmeterol (WIXELA INHUB) 250-50 MCG/ACT AEPB    Sig: Inhale 1 puff into the lungs in the morning and at bedtime.    Dispense:  90 each    Refill:  12   Tiotropium Bromide  (SPIRIVA  RESPIMAT) 2.5 MCG/ACT AERS    Sig: Inhale 2 puffs into the lungs daily.    Dispense:  4 g    Refill:  12     Current Outpatient Medications:    acetaminophen  (TYLENOL  8 HOUR) 650 MG CR tablet, Take 1 tablet (650 mg total) by mouth in the morning and at bedtime., Disp: , Rfl:    Albuterol  Sulfate (PROAIR  RESPICLICK) 108 (90 Base) MCG/ACT AEPB, Inhale 2 puffs into the lungs every 6 (six) hours as needed (cough, shortness of breath)., Disp: 1 each, Rfl: 3   apixaban  (ELIQUIS ) 5 MG TABS tablet, Take 5 mg by mouth 2 (two) times daily., Disp: , Rfl:    atorvastatin  (LIPITOR) 10 MG tablet, TAKE 1 TABLET(10 MG) BY MOUTH DAILY, Disp: 90 tablet, Rfl: 3   lisinopril  (ZESTRIL ) 40 MG tablet, TAKE 1 TABLET(40 MG) BY MOUTH DAILY, Disp: 90 tablet, Rfl: 3   loratadine (CLARITIN) 10 MG tablet, Take 10 mg by mouth as needed., Disp: , Rfl:    metoprolol  tartrate (LOPRESSOR ) 50 MG tablet, Take 1 tablet (50 mg total) by mouth 2 (two) times daily., Disp: 60 tablet, Rfl:  3   nicotine  polacrilex (NICORETTE) 4 MG gum, Take 4 mg by mouth as needed., Disp: , Rfl:    sildenafil  (VIAGRA ) 50 MG tablet, Take 50 mg by mouth daily as needed for erectile dysfunction. Taking 1/2 tablet PRN (from [facility]), Disp: , Rfl:    Tiotropium Bromide  (SPIRIVA  RESPIMAT) 2.5 MCG/ACT AERS, Inhale 2 puffs into the lungs daily., Disp: 4 g, Rfl: 12   Cholecalciferol  25 MCG  (1000 UT) tablet, Take 1,000 Units by mouth daily. (Patient not taking: Reported on 05/16/2024), Disp: , Rfl:    fluticasone -salmeterol (WIXELA INHUB) 250-50 MCG/ACT AEPB, Inhale 1 puff into the lungs in the morning and at bedtime., Disp: 90 each, Rfl: 12   OVER THE COUNTER MEDICATION, Take 2 tablets by mouth daily. sulfurzyme (Patient not taking: Reported on 05/16/2024), Disp: , Rfl:    Subjective:   PATIENT ID: [name] GENDER: male DOB: [birth year], MRN: [id number]  Chief Complaint  Patient presents with   Shortness of Breath    DOE for years. Occasional wheezing. Cough in the morning with clear sputum.  ProAir - couple times a week. Wixela- I puff daily helps with his breathing. Spiriva - PRN if the Wixela does not help    HPI  Discussed the use of AI scribe software for clinical note transcription with the patient, who gave verbal consent to proceed.  [name] is a 73 year old male with heart failure with preserved ejection fraction who presents with shortness of breath.  He experiences significant shortness of breath during physical activities such as mowing the yard or walking 100 yards, which leaves him gasping for air. Even minimal activity can exacerbate his symptoms. He has been retired for ten years and lacks regular exercise due to pain from multiple surgeries.  He has a history of smoking, currently smoking three to four cigarettes a day, reduced from half a pack. He associates smoking with drinking beer. He uses inhalers, including Wixela daily and Spiriva  occasionally, which he obtains from the [facility]. These inhalers help alleviate his symptoms.  He has a history of heart failure with preserved ejection fraction, with a cardiac catheterization in 2016 showing elevated left ventricular end-diastolic pressure (LVEDP of 24 mmHg 06/2015). He recalls being short of breath during physical activity around that time. He also has atrial fibrillation, diagnosed a few years  ago, and recently saw his cardiologist who reported his heart was 'good to go.'  He undergoes annual lung cancer screenings due to his smoking history. He experiences an occasional cough with phlegm, especially after walking or in the morning when using his inhaler.  He has a family history of heart disease, with both parents having had heart attacks and his father also having had a stroke in his late fifties to early sixties. He reports a significant smoking history, with 25-30 pack years of smoking history. He was exposed to asbestos in the [employer] ([employer]) and work teacher, early years/pre. He also worked [place] a [place] with exposure to [employer]. Also worked in a holiday representative with some exposures.  Ancillary information including prior medications, full medical/surgical/family/social histories, and PFTs (when available) are listed below and have been reviewed.    Review of Systems  Constitutional:  Negative for chills and fever.  Cardiovascular:  Negative for chest pain.     Objective:   Vitals:   05/16/24 1050  BP: 138/82  Pulse: 78  Temp: 98.1 F (36.7 C)  SpO2: 98%  Weight:  211 lb 6.4 oz (95.9 kg)  Height: 5' 6 (1.676 m)   98% on RA  BMI Readings from Last 3 Encounters:  05/16/24 34.12 kg/m  04/12/24 33.77 kg/m  01/22/24 35.15 kg/m   Wt Readings from Last 3 Encounters:  05/16/24 211 lb 6.4 oz (95.9 kg)  04/12/24 209 lb 3.2 oz (94.9 kg)  01/22/24 206 lb 6 oz (93.6 kg)    Physical Exam    Ancillary Information    Past Medical History:  Diagnosis Date   Allergic rhinitis 07/1998   Aortic atherosclerosis 09/17/2016   Arthritis    BACK AND RIGHT KNEE   CAD (coronary artery disease) 07/01/2015   a.) LHC 07/01/2015: normal LV function; 30% p-mLAD, 20% p-mRCA; intervention deferred opting for medical mgmt.   COPD (chronic obstructive pulmonary disease) (HCC) 07/1998   centrilobular emphysema (09/2016) w ongoing tobacco use, spirometry  (02/2013)   DDD (degenerative disc disease), cervical    DOE (dyspnea on exertion)    Erectile dysfunction    a.) on PDE5i (sildenafil )   ETOH abuse    a.) consumes 4-5 beers on a daily basis   History of exposure to asbestos    HTN (hypertension) 05/1998   Hyperlipidemia 06/1996   Hypertensive retinopathy    Long term current use of anticoagulant    a.) apixaban    Lumbar spinal stenosis 03/2013   a.) s/p laminectomy   Macrocytic anemia    Migraines    Peripheral vascular disease    Persistent atrial fibrillation (HCC)    a.) CHA2DS2-VASc = 3 (age, HTN, aortic plaque). 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   Smoker      Family History  Problem Relation Age of Onset   Coronary artery disease Mother    Hypertension Mother    Stroke Mother    Coronary artery disease Father    Hypertension Father    Diabetes Father    Stroke Father    Pneumonia Sister    Irregular heart beat Brother    Maple syrup urine disease Brother    Cancer Neg Hx      Past Surgical History:  Procedure Laterality Date   ANTERIOR CERVICAL DECOMP/DISCECTOMY FUSION  2003   C3-7   APPLICATION OF INTRAOPERATIVE CT SCAN N/A 10/11/2021   Procedure: APPLICATION OF INTRAOPERATIVE CT SCAN;  Surgeon: Clois Fret, MD;  Location: [facility];  Service: Neurosurgery;  Laterality: N/A;   BACK SURGERY  2007   CARDIAC CATHETERIZATION N/A 07/01/2015   Procedure: Left Heart Cath and Coronary Angiography;  Surgeon: Deatrice DELENA Cage, MD;  Location: [facility];  Service: Cardiovascular;  Laterality: N/A;   CARDIOVERSION N/A 05/06/2020   Procedure: CARDIOVERSION;  Surgeon: Darliss Rogue, MD;  Location: [facility];  Service: Cardiovascular;  Laterality: N/A;    COLONOSCOPY  09/2010   HPx2, diverticula, hemorrhoids, rpt 10 yrs [place])   COLONOSCOPY  01/07/2021   SSAx2, diverticulosis, rpt 7 yrs ([place])   CYSTOSCOPY WITH INSERTION OF UROLIFT N/A 11/02/2020   Procedure: CYSTOSCOPY WITH INSERTION OF UROLIFT;  Surgeon: Penne Knee, MD;  Location: [facility];  Service: Urology;  Laterality: N/A;   ETT  2010   normal, ABIs normal   HIP SURGERY Right 08/2022   R lateral femoral cutaneous nerve decompression/transposition  KNEE ARTHROSCOPY Right 2011   [place], ligaments   KNEE ARTHROSCOPY Left 1984   LUMBAR LAMINECTOMY  06/2020   Bilateral L2-3 laminectomy [place])   LUMBAR LAMINECTOMY/DECOMPRESSION MICRODISCECTOMY Left 03/27/2013   Procedure: LUMBAR LAMINECTOMY/DECOMPRESSION MICRODISCECTOMY 2 LEVELS;  Surgeon: Reyes JONETTA Budge, MD;  Location: [facility];  Service: Neurosurgery;  Laterality: Left;  LEFT Lumbar Three Four diskectomy with Lumbar Three-Four, Four-Five Laminectomy. L3/4 ahd L4/5 for LSS [place])    LUMBAR LAMINECTOMY/DECOMPRESSION MICRODISCECTOMY Bilateral 07/15/2020   Procedure: LAMINECTOMY AND FORAMINOTOMY BILATERAL LUMBAR TWO-LUMBAR THREE;  Surgeon: Budge Reyes, MD;  Location: [facility] OR;  Service: Neurosurgery;  Laterality: Bilateral;   POSTERIOR CERVICAL FUSION/FORAMINOTOMY N/A 05/14/2020   Procedure: LAMINECTOMY, POSTERIOR INSTRUMENTATION AND FUSION, CERVICAL ONE- CERVICAL TWO, CERVICAL TWO- CERVICAL THREE;  Surgeon: Budge Reyes, MD;  Location: [facility] OR;  Service: Neurosurgery;  Laterality: N/A;   POSTERIOR CERVICAL FUSION/FORAMINOTOMY N/A 10/11/2021   Procedure: C6-T3 POSTERIOR FUSION;  Surgeon: Clois Fret, MD;  Location: [facility];  Service: Neurosurgery;  Laterality: N/A;   POSTERIOR CERVICAL LAMINECTOMY N/A 10/11/2021   Procedure: C6-T2 POSTERIOR SPINAL DECOMPRESSION;  Surgeon: Clois Fret, MD;  Location: [facility];  Service: Neurosurgery;  Laterality: N/A;   SHOULDER ARTHROSCOPY Right 2011   spriometry   02/2013   COPD with some restriction thought to be obesity related ([place])   TOTAL KNEE ARTHROPLASTY Right 06/27/2014   Procedure: RIGHT TOTAL KNEE ARTHROPLASTY;  Surgeon: Lonni CINDERELLA Poli, MD;  Location: [facility];  Service: Orthopedics;  Laterality: Right;   US  ECHOCARDIOGRAPHY  01/2013   WNL, mild diastolic dysfunction    Social History   Socioeconomic History   Marital status: Married    Spouse name: Not on file   Number of children: Not on file   Years of education: Not on file   Highest education level: Not on file  Occupational History   Not on file  Tobacco Use   Smoking status: Every Day    Current packs/day: 0.50    Average packs/day: 0.5 packs/day for 40.0 years (20.0 ttl pk-yrs)    Types: Cigarettes   Smokeless tobacco: Never   Tobacco comments:    3-4 cigarettes a day - khj 05/16/2024        Started smoking at 73 yrs old.    Smoked 0.5 PPD at his heaviest.   Vaping Use   Vaping status: Never Used  Substance and Sexual Activity   Alcohol use: Yes    Alcohol/week: 28.0 standard drinks of alcohol    Types: 28 Cans of beer per week    Comment: 4-5 beers daily   Drug use: No   Sexual activity: Not Currently  Other Topics Concern   Not on file  Social History Narrative   Lives with wife. Has 1 son, 2 daughters.    Occupation: Event organiser.    Activity: no exercise   Diet: good water, fruits/vegetables daily      Screened positive for OSA at recent hospitalization (03/2013)   Social Drivers of Health   Financial Resource Strain: Low Risk  (01/16/2024)   Overall Financial Resource Strain (CARDIA)    Difficulty of Paying Living Expenses: Not very hard  Food Insecurity: No Food Insecurity (01/16/2024)   Hunger Vital Sign    Worried About Running Out of Food in the Last Year: Never true    Ran Out of Food in the Last Year: Never true  Transportation Needs: No Transportation Needs (01/16/2024)   PRAPARE - Transportation    Lack of Transportation  (  Medical): No    Lack of Transportation (Non-Medical): No  Physical Activity: Insufficiently Active (01/16/2024)   Exercise Vital Sign    Days of Exercise per Week: 3 days    Minutes of Exercise per Session: 30 min  Stress: No Stress Concern Present (01/16/2024)   [employer] of Occupational Health - Occupational Stress Questionnaire    Feeling of Stress: Not at all  Social Connections: Socially Integrated (01/16/2024)   Social Connection and Isolation Panel    Frequency of Communication with Friends and Family: More than three times a week    Frequency of Social Gatherings with Friends and Family: More than three times a week    Attends Religious Services: More than 4 times per year    Active Member of Clubs or Organizations: Yes    Attends Banker Meetings: More than 4 times per year    Marital Status: Married  Catering Manager Violence: Not At Risk (01/16/2024)   Humiliation, Afraid, Rape, and Kick questionnaire    Fear of Current or Ex-Partner: No    Emotionally Abused: No    Physically Abused: No    Sexually Abused: No     Allergies  Allergen Reactions   Varenicline  Other (See Comments)    headaches     CBC    Component Value Date/Time   WBC 4.6 01/15/2024 0758   RBC 3.43 (L) 01/15/2024 0758   HGB 12.1 (L) 01/15/2024 0758   HGB 13.4 04/24/2020 1206   HCT 35.1 (L) 01/15/2024 0758   HCT 37.5 04/24/2020 1206   PLT 206.0 01/15/2024 0758   PLT 235 04/24/2020 1206   MCV 102.2 (H) 01/15/2024 0758   MCV 97 04/24/2020 1206   MCH 33.5 10/11/2021 2157   MCHC 34.3 01/15/2024 0758   RDW 13.6 01/15/2024 0758   RDW 12.0 04/24/2020 1206   LYMPHSABS 1.8 01/15/2024 0758   MONOABS 0.4 01/15/2024 0758   EOSABS 0.2 01/15/2024 0758   BASOSABS 0.0 01/15/2024 0758    Pulmonary Functions Testing Results:     No data to display          Outpatient Medications Prior to Visit  Medication Sig Dispense Refill   acetaminophen  (TYLENOL  8 HOUR) 650 MG CR tablet Take 1  tablet (650 mg total) by mouth in the morning and at bedtime.     Albuterol  Sulfate (PROAIR  RESPICLICK) 108 (90 Base) MCG/ACT AEPB Inhale 2 puffs into the lungs every 6 (six) hours as needed (cough, shortness of breath). 1 each 3   apixaban  (ELIQUIS ) 5 MG TABS tablet Take 5 mg by mouth 2 (two) times daily.     atorvastatin  (LIPITOR) 10 MG tablet TAKE 1 TABLET(10 MG) BY MOUTH DAILY 90 tablet 3   lisinopril  (ZESTRIL ) 40 MG tablet TAKE 1 TABLET(40 MG) BY MOUTH DAILY 90 tablet 3   loratadine (CLARITIN) 10 MG tablet Take 10 mg by mouth as needed.     metoprolol  tartrate (LOPRESSOR ) 50 MG tablet Take 1 tablet (50 mg total) by mouth 2 (two) times daily. 60 tablet 3   nicotine  polacrilex (NICORETTE) 4 MG gum Take 4 mg by mouth as needed.     sildenafil  (VIAGRA ) 50 MG tablet Take 50 mg by mouth daily as needed for erectile dysfunction. Taking 1/2 tablet PRN (from [facility])     fluticasone -salmeterol (WIXELA INHUB) 250-50 MCG/ACT AEPB Inhale 1 puff into the lungs daily.     tiotropium (SPIRIVA ) 18 MCG inhalation capsule Place 18 mcg into inhaler and inhale daily.  Cholecalciferol  25 MCG (1000 UT) tablet Take 1,000 Units by mouth daily. (Patient not taking: Reported on 05/16/2024)     OVER THE COUNTER MEDICATION Take 2 tablets by mouth daily. sulfurzyme (Patient not taking: Reported on 05/16/2024)     No facility-administered medications prior to visit.

## 2024-05-16 NOTE — Patient Instructions (Signed)
  VISIT SUMMARY: Today, we discussed your shortness of breath and its potential causes, including COPD and heart failure. We also reviewed your current treatments and smoking habits.  YOUR PLAN: -SHORTNESS OF BREATH: Your shortness of breath, especially during physical activities, may be due to COPD or heart failure. We will perform a pulmonary function test to check your lung function and coordinate with your cardiologist to evaluate your heart condition.  -CHRONIC OBSTRUCTIVE PULMONARY DISEASE (COPD): COPD is a lung condition that makes it hard to breathe. You should use Wixela one puff twice daily and Spiriva  two puffs once daily. We will switch your Spiriva  to a mist form for easier use and send prescription refills to the [facility].  -HEART FAILURE WITH PRESERVED EJECTION FRACTION (HFPEF): HFpEF is a type of heart failure where the heart pumps normally but is stiff and doesn't fill properly. We will coordinate with your cardiologist to reassess your heart pressures and evaluate if this is contributing to your symptoms.  -ATRIAL FIBRILLATION: Atrial fibrillation is an irregular heart rhythm that can increase the risk of heart failure. Your recent cardiology evaluation showed it is well-managed, but we will continue to monitor it with your cardiologist.  -TOBACCO USE DISORDER: You are currently smoking 3-4 cigarettes per day, which is a reduction from your previous habit. Smoking is linked to your beer consumption. Reducing or quitting smoking can significantly improve your overall health.  INSTRUCTIONS: Please follow up with the pulmonary function test as ordered. We will also coordinate with your cardiologist, Dr. Daleen, to evaluate your heart pressures. Continue using your inhalers as prescribed and try to reduce your smoking further. Prescription refills for Wixela and Spiriva  will be sent to the [facility].

## 2024-05-17 ENCOUNTER — Other Ambulatory Visit: Payer: Self-pay

## 2024-05-17 ENCOUNTER — Telehealth: Payer: Self-pay

## 2024-05-17 DIAGNOSIS — J42 Unspecified chronic bronchitis: Secondary | ICD-10-CM

## 2024-05-17 MED ORDER — FLUTICASONE-SALMETEROL 250-50 MCG/ACT IN AEPB
1.0000 | INHALATION_SPRAY | Freq: Two times a day (BID) | RESPIRATORY_TRACT | 12 refills | Status: AC
  Filled 2024-05-17: qty 60, 30d supply, fill #0
  Filled 2024-06-17: qty 60, 30d supply, fill #1

## 2024-05-17 MED ORDER — SPIRIVA RESPIMAT 2.5 MCG/ACT IN AERS
2.0000 | INHALATION_SPRAY | Freq: Every day | RESPIRATORY_TRACT | 12 refills | Status: AC
  Filled 2024-05-17: qty 4, 30d supply, fill #0
  Filled 2024-06-17: qty 4, 30d supply, fill #1

## 2024-05-17 NOTE — Telephone Encounter (Signed)
 Copied from CRM #[id number]. Topic: Clinical - Prescription Issue >> May 17, 2024 10:46 AM Rilla NOVAK wrote: Reason for CRM: Patient calling to state there's too much red tape [facility] and the new inhaler.  Would like the script sent to [facility] [place].  Please call patient to confirm @ [phone].    ----------------------------------------------------------------------- From previous Reason for Contact - Other: Reason for CRM:

## 2024-05-17 NOTE — Telephone Encounter (Signed)
 SABRA

## 2024-05-17 NOTE — Telephone Encounter (Signed)
 I have sent in the scripts and notified the patient.  Nothing further needed.

## 2024-05-20 ENCOUNTER — Other Ambulatory Visit: Payer: Self-pay

## 2024-05-20 ENCOUNTER — Encounter: Payer: Self-pay | Admitting: Radiology

## 2024-06-10 NOTE — H&P (View-Only) (Signed)
 Cardiology Office Note   Date:  06/11/2024  ID:  DELFORD WINGERT, DOB 1950/12/10, MRN [id number] PCP: Rilla Baller, MD  [facility] Providers Cardiologist:  Redell Cave, MD  History of Present Illness [name] is a 73 y.o. male with a h/o persistent Afib on Eliquis , nonobstructive CAD (LHC 2016 LAD 30%, RCA 20%), HTN, HLD, COPD, current smoker, DDD who presents for follow-up of DOE.   Left heart cath in 2016 showed mildly calcified coronary arteries with mild two-vessel CAD involving the proximal to mid LAD and proximal to mid right coronary artery.  Normal LVSF.  Patient was noted to be in A-fib in September/2021. He was seen by his primary care for preop clearance and noted to be in A-fib.  He was referred to cardiology and started on Eliquis  and Lopressor .  Echo showed low normal systolic function with a EF of 50%.  Patient underwent cardioversion 05/06/2020 that was unsuccessful.  Patient saw EP who felt he was not a good ablation candidate.  Echo in July 2025 showed LVEF 60 to 65%, no wall motion abnormalities, mildly dilated left atrium.  Myoview  Lexi scan was normal without evidence of a significant ischemia or scar.  Normal EF, normal wall motion, low risk study.  Patient was last seen 03/2024 reporting occasional shortness of breath.  Today, the patient reports he has persistent DOE. He saw pulmonology who recommended RHC. Pulmonology has also ordered PFTs. He denies chest pain. He is a smoker, but wanting to quit.   Studies Reviewed EKG Interpretation Date/Time:  Tuesday June 11 2024 10:10:16 EST Ventricular Rate:  72 PR Interval:    QRS Duration:  78 QT Interval:  364 QTC Calculation: 398 R Axis:   28  Text Interpretation: Atrial fibrillation Nonspecific ST and T wave abnormality When compared with ECG of 12-Apr-2024 09:56, No significant change was found Confirmed by Franchester, Vicy Medico ([id number]) on 06/11/2024 10:14:35 AM    MPI 01/2024   Normal  pharmacologic myocardial perfusion stress test without evidence of significant ischemia or scar.   Left ventricular systolic function is normal (LVEF 55-65%) with normal wall motion.   This is a low-risk study.    Echo 01/2024 1. Left ventricular ejection fraction, by estimation, is 60 to 65%. The  left ventricle has normal function. The left ventricle has no regional  wall motion abnormalities. Left ventricular diastolic parameters are  indeterminate.   2. Right ventricular systolic function is normal. The right ventricular  size is normal.   3. Left atrial size was mildly dilated.   4. The mitral valve is normal in structure. No evidence of mitral valve  regurgitation. No evidence of mitral stenosis.   5. The aortic valve is normal in structure. Aortic valve regurgitation is  not visualized. No aortic stenosis is present.   6. The inferior vena cava is normal in size with greater than 50%  respiratory variability, suggesting right atrial pressure of 3 mmHg.   Echo 03/2020 1. Left ventricular ejection fraction, by estimation, is 45 to 50%. The  left ventricle has mildly decreased function. The left ventricle  demonstrates global hypokinesis. There is mild left ventricular  hypertrophy. Left ventricular diastolic parameters  are indeterminate.   2. Right ventricular systolic function is mildly reduced. The right  ventricular size is mildly enlarged. Tricuspid regurgitation signal is  inadequate for assessing PA pressure.   3. Right atrial size was mildly dilated.   4. The mitral valve is normal in structure. Trivial mitral valve  regurgitation.   5. The aortic valve is tricuspid. Aortic valve regurgitation is not  visualized. No aortic stenosis is present.   6. The inferior vena cava is normal in size with greater than 50%  respiratory variability, suggesting right atrial pressure of 3 mmHg.   LHC 2016  The left ventricular systolic function is normal. Prox LAD to Mid LAD  lesion, 30% stenosed. Prox RCA to Mid RCA lesion, 20% stenosed.   1. Mildly calcified coronary arteries with mild two-vessel coronary artery disease involving the proximal to mid LAD and proximal to midright coronary artery. No evidence of obstructive disease. 2. Normal LV systolic function. 3. Moderately elevated left ventricular end-diastolic pressure at 24 mmHg.   Recommendations: The patient has no evidence of obstructive coronary artery disease. Continue aggressive medical therapy and treatment of risk factors. He does seem to have diastolic heart failure as evidenced by moderately elevated left ventricular end-diastolic pressure. The patient might and affect from a small dose furosemide . He also has symptoms suggestive of sleep apnea which might be contributing to his diastolic heart failure. Recommend pulmonary evaluation for this.     Physical Exam VS:  BP 138/80   Pulse 72   Ht 5' 6 (1.676 m)   Wt 211 lb 12.8 oz (96.1 kg)   SpO2 99%   BMI 34.19 kg/m        Wt Readings from Last 3 Encounters:  06/11/24 211 lb 12.8 oz (96.1 kg)  05/16/24 211 lb 6.4 oz (95.9 kg)  04/12/24 209 lb 3.2 oz (94.9 kg)    GEN: Well nourished, well developed in no acute distress NECK: No JVD; No carotid bruits CARDIAC: RRR, no murmurs, rubs, gallops RESPIRATORY:  Clear to auscultation without rales, wheezing or rhonchi  ABDOMEN: Soft, non-tender, non-distended EXTREMITIES:  No edema; No deformity   ASSESSMENT AND PLAN  DOE Patient reports persistent DOE. Prior MPI and echo were unremarkable. He is following with pulmonology for COPD and smoking history. Plan for PFTs in the future. Pulmonologist was wondering about RHC. Patient reports persistent DOE. We will set him up for RHC, will need to hold Eliquis  2 days prior to procedure.   Persistent Afib Failed DCCV in 2021. He is asymptomatic. Continue Eliquis  5mg  BID for stroke ppx. Continue Lopressor  50mg  BID for rate control.   HTN BP mildly  elevated. Pedro address at follow-up. Recommend healthy lifestyle changes. Continue lisinopril  40mg  daily and lopressor  50mg  BID.  Tobacco use He is a smoker, cessation recommended.     Informed Consent   Shared Decision Making/Informed Consent The risks, including but not limited to, [bleeding or vascular complications (1 in 500), pneumothorax (1 in 1600), arrhythmia (1 in 1000) and death (1 in 5000)], benefits (diagnostic support and/or management of heart failure, pulmonary hypertension) and alternatives of a right heart catheterization were discussed in detail with Mr. Lamorte and he is willing to proceed.     Dispo: follow-up 2-3 weeks after RHC  Signed, Alleya Demeter VEAR Fishman, PA-C

## 2024-06-10 NOTE — Progress Notes (Unsigned)
 Cardiology Office Note   Date:  06/11/2024  ID:  [name], DOB 08/07/1950, MRN [id number] PCP: Rilla Baller, MD  [facility] Providers Cardiologist:  Redell Cave, MD  History of Present Illness [name] is a 73 y.o. male with a h/o persistent Afib on Eliquis , nonobstructive CAD (LHC 2016 LAD 30%, RCA 20%), HTN, HLD, COPD, current smoker, DDD who presents for follow-up of DOE.   Left heart cath in 2016 showed mildly calcified coronary arteries with mild two-vessel CAD involving the proximal to mid LAD and proximal to mid right coronary artery.  Normal LVSF.  Patient was noted to be in A-fib in September/2021. He was seen by his primary care for preop clearance and noted to be in A-fib.  He was referred to cardiology and started on Eliquis  and Lopressor .  Echo showed low normal systolic function with a EF of 50%.  Patient underwent cardioversion 05/06/2020 that was unsuccessful.  Patient saw EP who felt he was not a good ablation candidate.  Echo in July 2025 showed LVEF 60 to 65%, no wall motion abnormalities, mildly dilated left atrium.  Myoview  Lexi scan was normal without evidence of a significant ischemia or scar.  Normal EF, normal wall motion, low risk study.  Patient was last seen 03/2024 reporting occasional shortness of breath.  Today, the patient reports he has persistent DOE. He saw pulmonology who recommended RHC. Pulmonology has also ordered PFTs. He denies chest pain. He is a smoker, but wanting to quit.   Studies Reviewed EKG Interpretation Date/Time:  Tuesday June 11 2024 10:10:16 EST Ventricular Rate:  72 PR Interval:    QRS Duration:  78 QT Interval:  364 QTC Calculation: 398 R Axis:   28  Text Interpretation: Atrial fibrillation Nonspecific ST and T wave abnormality When compared with ECG of 12-Apr-2024 09:56, No significant change was found Confirmed by Franchester, Judine Arciniega ([id number]) on 06/11/2024 10:14:35 AM    MPI 01/2024   Normal  pharmacologic myocardial perfusion stress test without evidence of significant ischemia or scar.   Left ventricular systolic function is normal (LVEF 55-65%) with normal wall motion.   This is a low-risk study.    Echo 01/2024 1. Left ventricular ejection fraction, by estimation, is 60 to 65%. The  left ventricle has normal function. The left ventricle has no regional  wall motion abnormalities. Left ventricular diastolic parameters are  indeterminate.   2. Right ventricular systolic function is normal. The right ventricular  size is normal.   3. Left atrial size was mildly dilated.   4. The mitral valve is normal in structure. No evidence of mitral valve  regurgitation. No evidence of mitral stenosis.   5. The aortic valve is normal in structure. Aortic valve regurgitation is  not visualized. No aortic stenosis is present.   6. The inferior vena cava is normal in size with greater than 50%  respiratory variability, suggesting right atrial pressure of 3 mmHg.   Echo 03/2020 1. Left ventricular ejection fraction, by estimation, is 45 to 50%. The  left ventricle has mildly decreased function. The left ventricle  demonstrates global hypokinesis. There is mild left ventricular  hypertrophy. Left ventricular diastolic parameters  are indeterminate.   2. Right ventricular systolic function is mildly reduced. The right  ventricular size is mildly enlarged. Tricuspid regurgitation signal is  inadequate for assessing PA pressure.   3. Right atrial size was mildly dilated.   4. The mitral valve is normal in structure. Trivial mitral valve  regurgitation.   5. The aortic valve is tricuspid. Aortic valve regurgitation is not  visualized. No aortic stenosis is present.   6. The inferior vena cava is normal in size with greater than 50%  respiratory variability, suggesting right atrial pressure of 3 mmHg.   LHC 2016  The left ventricular systolic function is normal. Prox LAD to Mid LAD  lesion, 30% stenosed. Prox RCA to Mid RCA lesion, 20% stenosed.   1. Mildly calcified coronary arteries with mild two-vessel coronary artery disease involving the proximal to mid LAD and proximal to midright coronary artery. No evidence of obstructive disease. 2. Normal LV systolic function. 3. Moderately elevated left ventricular end-diastolic pressure at 24 mmHg.   Recommendations: The patient has no evidence of obstructive coronary artery disease. Continue aggressive medical therapy and treatment of risk factors. He does seem to have diastolic heart failure as evidenced by moderately elevated left ventricular end-diastolic pressure. The patient might and affect from a small dose furosemide . He also has symptoms suggestive of sleep apnea which might be contributing to his diastolic heart failure. Recommend pulmonary evaluation for this.     Physical Exam VS:  BP 138/80   Pulse 72   Ht 5' 6 (1.676 m)   Wt 211 lb 12.8 oz (96.1 kg)   SpO2 99%   BMI 34.19 kg/m        Wt Readings from Last 3 Encounters:  06/11/24 211 lb 12.8 oz (96.1 kg)  05/16/24 211 lb 6.4 oz (95.9 kg)  04/12/24 209 lb 3.2 oz (94.9 kg)    GEN: Well nourished, well developed in no acute distress NECK: No JVD; No carotid bruits CARDIAC: RRR, no murmurs, rubs, gallops RESPIRATORY:  Clear to auscultation without rales, wheezing or rhonchi  ABDOMEN: Soft, non-tender, non-distended EXTREMITIES:  No edema; No deformity   ASSESSMENT AND PLAN  DOE Patient reports persistent DOE. Prior MPI and echo were unremarkable. He is following with pulmonology for COPD and smoking history. Plan for PFTs in the future. Pulmonologist was wondering about RHC. Patient reports persistent DOE. We will set him up for RHC, will need to hold Eliquis  2 days prior to procedure.   Persistent Afib Failed DCCV in 2021. He is asymptomatic. Continue Eliquis  5mg  BID for stroke ppx. Continue Lopressor  50mg  BID for rate control.   HTN BP mildly  elevated. Can address at follow-up. Recommend healthy lifestyle changes. Continue lisinopril  40mg  daily and lopressor  50mg  BID.  Tobacco use He is a smoker, cessation recommended.     Informed Consent   Shared Decision Making/Informed Consent The risks, including but not limited to, [bleeding or vascular complications (1 in 500), pneumothorax (1 in 1600), arrhythmia (1 in 1000) and death (1 in 5000)], benefits (diagnostic support and/or management of heart failure, pulmonary hypertension) and alternatives of a right heart catheterization were discussed in detail with Mr. Soler and he is willing to proceed.     Dispo: follow-up 2-3 weeks after RHC  Signed, Analis Distler VEAR Fishman, PA-C

## 2024-06-11 ENCOUNTER — Ambulatory Visit: Attending: Medical | Admitting: Medical

## 2024-06-11 ENCOUNTER — Encounter: Payer: Self-pay | Admitting: Medical

## 2024-06-11 VITALS — BP 138/80 | HR 72 | Ht 66.0 in | Wt 211.8 lb

## 2024-06-11 DIAGNOSIS — R0602 Shortness of breath: Secondary | ICD-10-CM | POA: Diagnosis not present

## 2024-06-11 DIAGNOSIS — F17200 Nicotine dependence, unspecified, uncomplicated: Secondary | ICD-10-CM

## 2024-06-11 DIAGNOSIS — I10 Essential (primary) hypertension: Secondary | ICD-10-CM

## 2024-06-11 DIAGNOSIS — R0609 Other forms of dyspnea: Secondary | ICD-10-CM

## 2024-06-11 DIAGNOSIS — I4819 Other persistent atrial fibrillation: Secondary | ICD-10-CM

## 2024-06-11 NOTE — Patient Instructions (Signed)
 Medication Instructions:  Your physician recommends that you continue on your current medications as directed. Please refer to the Current Medication list given to you today.    *If you need a refill on your cardiac medications before your next appointment, please call your pharmacy*  Lab Work: Your provider would like for you to have following labs drawn today CBC, BMP.     Testing/Procedures:  [facility] [place] [place] [address] [place] [address] Dept: [phone] Loc: [phone]  [name]  06/11/2024  You are scheduled for a Cardiac Catheterization on Monday, December 8 with Dr. Deatrice Cage.  1. Please arrive at the [facility] Entrance of [facility], [address], [place] [address] at 8:30 AM (This is 1 hour(s) prior to your procedure time).  Proceed to the Check-In Desk directly inside the entrance.  Procedure Parking: Use the entrance off of the [address] side of the hospital. Turn right upon entering and follow the driveway to parking that is directly in front of the [facility]. There is no valet parking available at this entrance, however there is an awning directly in front of the [facility]ascular Center for drop off/ pick up for patients.  Special note: Every effort is made to have your procedure done on time. Please understand that emergencies sometimes delay scheduled procedures.  2. Diet: Nothing to eat after midnight.   3. Hydration: You need to be well hydrated before your procedure. On December 8, you may drink approved liquids (see below) until 2 hours before the procedure, with 16 oz of water as your last intake.   List of approved liquids water, clear juice, clear tea, black coffee, fruit juices, non-citric and without pulp, carbonated beverages, Gatorade, Kool -Aid, plain Jello-O and plain ice popsicles.  4. Labs:  You will need to have blood drawn today (CBC, BMP)  5. Medication instructions in preparation for your procedure:   Contrast Allergy: No  Hold Eliquis  2 days prior to procedure   On the morning of your procedure, take your Aspirin  81 mg and any morning medicines NOT listed above.  You may use sips of water.  6. Plan to go home the same day, you will only stay overnight if medically necessary. 7. Bring a current list of your medications and current insurance cards. 8. You MUST have a responsible person to drive you home. 9. Someone MUST be with you the first 24 hours after you arrive home or your discharge will be delayed. 10. Please wear clothes that are easy to get on and off and wear slip-on shoes.  Thank you for allowing us  to care for you!   -- [facility]   Follow-Up: At [facility], you and your health needs are our priority.  As part of our continuing mission to provide you with exceptional heart care, our providers are all part of one team.  This team includes your primary Cardiologist (physician) and Advanced Practice Providers or APPs (Physician Assistants and Nurse Practitioners) who all work together to provide you with the care you need, when you need it.  Your next appointment:   3 week(s) after Cath on 06/24/24  Provider:   Cadence Franchester, PA-C

## 2024-06-12 LAB — CBC
Hematocrit: 37.8 % (ref 37.5–51.0)
Hemoglobin: 12.5 g/dL — ABNORMAL LOW (ref 13.0–17.7)
MCH: 34.9 pg — ABNORMAL HIGH (ref 26.6–33.0)
MCHC: 33.1 g/dL (ref 31.5–35.7)
MCV: 106 fL — ABNORMAL HIGH (ref 79–97)
Platelets: 215 x10E3/uL (ref 150–450)
RBC: 3.58 x10E6/uL — ABNORMAL LOW (ref 4.14–5.80)
RDW: 11.9 % (ref 11.6–15.4)
WBC: 5.4 x10E3/uL (ref 3.4–10.8)

## 2024-06-12 LAB — BASIC METABOLIC PANEL WITH GFR
BUN/Creatinine Ratio: 13 (ref 10–24)
BUN: 12 mg/dL (ref 8–27)
CO2: 24 mmol/L (ref 20–29)
Calcium: 10 mg/dL (ref 8.6–10.2)
Chloride: 99 mmol/L (ref 96–106)
Creatinine, Ser: 0.96 mg/dL (ref 0.76–1.27)
Glucose: 120 mg/dL — ABNORMAL HIGH (ref 70–99)
Potassium: 5.1 mmol/L (ref 3.5–5.2)
Sodium: 136 mmol/L (ref 134–144)
eGFR: 84 mL/min/1.73 (ref >59)

## 2024-06-17 ENCOUNTER — Other Ambulatory Visit: Payer: Self-pay

## 2024-06-18 ENCOUNTER — Ambulatory Visit: Payer: Self-pay | Admitting: Medical

## 2024-06-24 ENCOUNTER — Other Ambulatory Visit: Payer: Self-pay

## 2024-06-24 ENCOUNTER — Encounter: Payer: Self-pay | Admitting: Cardiovascular Disease

## 2024-06-24 ENCOUNTER — Ambulatory Visit
Admission: RE | Admit: 2024-06-24 | Discharge: 2024-06-24 | Disposition: A | Attending: Cardiovascular Disease | Admitting: Cardiovascular Disease

## 2024-06-24 ENCOUNTER — Encounter: Admission: RE | Disposition: A | Payer: Self-pay | Source: Home / Self Care | Attending: Cardiovascular Disease

## 2024-06-24 DIAGNOSIS — I4819 Other persistent atrial fibrillation: Secondary | ICD-10-CM | POA: Diagnosis not present

## 2024-06-24 DIAGNOSIS — I272 Pulmonary hypertension, unspecified: Secondary | ICD-10-CM

## 2024-06-24 DIAGNOSIS — J449 Chronic obstructive pulmonary disease, unspecified: Secondary | ICD-10-CM | POA: Diagnosis not present

## 2024-06-24 DIAGNOSIS — E785 Hyperlipidemia, unspecified: Secondary | ICD-10-CM | POA: Diagnosis not present

## 2024-06-24 DIAGNOSIS — I10 Essential (primary) hypertension: Secondary | ICD-10-CM | POA: Diagnosis not present

## 2024-06-24 DIAGNOSIS — F17200 Nicotine dependence, unspecified, uncomplicated: Secondary | ICD-10-CM | POA: Diagnosis not present

## 2024-06-24 DIAGNOSIS — R0602 Shortness of breath: Secondary | ICD-10-CM

## 2024-06-24 DIAGNOSIS — Z79899 Other long term (current) drug therapy: Secondary | ICD-10-CM | POA: Diagnosis not present

## 2024-06-24 DIAGNOSIS — Z7901 Long term (current) use of anticoagulants: Secondary | ICD-10-CM | POA: Diagnosis not present

## 2024-06-24 DIAGNOSIS — I2510 Atherosclerotic heart disease of native coronary artery without angina pectoris: Secondary | ICD-10-CM | POA: Diagnosis not present

## 2024-06-24 HISTORY — PX: RIGHT HEART CATH: CATH118263

## 2024-06-24 LAB — POCT I-STAT EG7
Acid-Base Excess: 2 mmol/L (ref 0.0–2.0)
Acid-Base Excess: 3 mmol/L — ABNORMAL HIGH (ref 0.0–2.0)
Bicarbonate: 27.3 mmol/L (ref 20.0–28.0)
Bicarbonate: 28 mmol/L (ref 20.0–28.0)
Calcium, Ion: 1.27 mmol/L (ref 1.15–1.40)
Calcium, Ion: 1.27 mmol/L (ref 1.15–1.40)
HCT: 37 % — ABNORMAL LOW (ref 39.0–52.0)
HCT: 38 % — ABNORMAL LOW (ref 39.0–52.0)
Hemoglobin: 12.6 g/dL — ABNORMAL LOW (ref 13.0–17.0)
Hemoglobin: 12.9 g/dL — ABNORMAL LOW (ref 13.0–17.0)
O2 Saturation: 62 %
O2 Saturation: 66 %
Potassium: 4 mmol/L (ref 3.5–5.1)
Potassium: 4 mmol/L (ref 3.5–5.1)
Sodium: 134 mmol/L — ABNORMAL LOW (ref 135–145)
Sodium: 134 mmol/L — ABNORMAL LOW (ref 135–145)
TCO2: 29 mmol/L (ref 22–32)
TCO2: 29 mmol/L (ref 22–32)
pCO2, Ven: 44.4 mmHg (ref 44–60)
pCO2, Ven: 45.5 mmHg (ref 44–60)
pH, Ven: 7.397 (ref 7.25–7.43)
pH, Ven: 7.398 (ref 7.25–7.43)
pO2, Ven: 33 mmHg (ref 32–45)
pO2, Ven: 34 mmHg (ref 32–45)

## 2024-06-24 SURGERY — RIGHT HEART CATH
Anesthesia: Moderate Sedation | Laterality: Right

## 2024-06-24 MED ORDER — LIDOCAINE HCL (PF) 1 % IJ SOLN
INTRAMUSCULAR | Status: DC | PRN
  Administered 2024-06-24: 2 mL

## 2024-06-24 MED ORDER — HEPARIN (PORCINE) IN NACL 1000-0.9 UT/500ML-% IV SOLN
INTRAVENOUS | Status: AC
  Filled 2024-06-24: qty 1000

## 2024-06-24 MED ORDER — HEPARIN (PORCINE) IN NACL 1000-0.9 UT/500ML-% IV SOLN
INTRAVENOUS | Status: DC | PRN
  Administered 2024-06-24: 1000 mL

## 2024-06-24 MED ORDER — SODIUM CHLORIDE 0.9% FLUSH: 3.0000 mL | INTRAVENOUS | Status: DC | PRN

## 2024-06-24 MED ORDER — ONDANSETRON HCL 4 MG/2ML IJ SOLN: 4.0000 mg | Freq: Four times a day (QID) | INTRAMUSCULAR | Status: DC | PRN

## 2024-06-24 MED ORDER — MIDAZOLAM HCL 2 MG/2ML IJ SOLN
INTRAMUSCULAR | Status: AC
  Filled 2024-06-24: qty 2

## 2024-06-24 MED ORDER — SODIUM CHLORIDE 0.9% FLUSH: 3.0000 mL | Freq: Two times a day (BID) | INTRAVENOUS | Status: DC

## 2024-06-24 MED ORDER — SODIUM CHLORIDE 0.9 % IV SOLN: 250.0000 mL | INTRAVENOUS | Status: DC | PRN

## 2024-06-24 MED ORDER — ACETAMINOPHEN 325 MG PO TABS: 650.0000 mg | ORAL_TABLET | ORAL | Status: DC | PRN

## 2024-06-24 MED ORDER — ASPIRIN 81 MG PO CHEW: 81.0000 mg | CHEWABLE_TABLET | ORAL | Status: DC

## 2024-06-24 MED ORDER — FENTANYL CITRATE (PF) 100 MCG/2ML IJ SOLN
INTRAMUSCULAR | Status: AC
  Filled 2024-06-24: qty 2

## 2024-06-24 MED ORDER — LIDOCAINE HCL 1 % IJ SOLN
INTRAMUSCULAR | Status: AC
  Filled 2024-06-24: qty 20

## 2024-06-24 SURGICAL SUPPLY — 7 items
CATH BALLN WEDGE 5F 110CM (CATHETERS) IMPLANT
DRAPE BRACHIAL (DRAPES) IMPLANT
GUIDEWIRE EMER 3M J .025X150CM (WIRE) IMPLANT
PACK CARDIAC CATH (CUSTOM PROCEDURE TRAY) ×1 IMPLANT
SET ATX-X65L (MISCELLANEOUS) IMPLANT
SHEATH GLIDE SLENDER 4/5FR (SHEATH) IMPLANT
STATION PROTECTION PRESSURIZED (MISCELLANEOUS) IMPLANT

## 2024-06-24 NOTE — Discharge Instructions (Addendum)
*  Resume Eliquis  tonight* What can I expect after the procedure? After the procedure, it is common to have bruising and tenderness in the incision area. Follow these instructions at home: Incision site care  Follow instructions from your health care provider about how to take care of your incision site. Make sure you: Wash your hands with soap and water for at least 20 seconds before and after you change your bandage (dressing). If soap and water are not available, use hand sanitizer. Change or remove your dressing as told by your health care provider. Do not take baths, swim, or use a hot tub until your health care provider approves. You may shower 24 hours after the procedure or as told by your health care provider. Remove the dressing and gently wash the incision area with plain soap and water. Pat the area dry with a clean towel. Do not rub the site. That could cause bleeding. Do not apply powder or lotion to the site. Check your incision site every day for signs of infection. Check for: Redness, swelling, or pain. Fluid or blood. Warmth. Pus or a bad smell. General instructions Return to your normal activities as told by your health care provider. Ask your health care provider what activities are safe for you and when you can return to work. If you were given a sedative during the procedure, it can affect you for several hours. Do not drive or operate machinery until your health care provider says that it is safe Take over-the-counter and prescription medicines only as told by your health care provider. If you will be going home right after the procedure, plan to have a responsible adult care for you for the time you are told. This is important. Keep all follow-up visits. This is important. Contact a health care provider if: You have a fever or chills. You have any of these signs of infection at your incision site: Redness, swelling, or pain. Fluid or blood. Warmth. Pus or a bad  smell.

## 2024-06-24 NOTE — Interval H&P Note (Signed)
 History and Physical Interval Note:  06/24/2024 9:44 AM  [name]  has presented today for surgery, with the diagnosis of R Cath   Shortness of breath.  The various methods of treatment have been discussed with the patient and family. After consideration of risks, benefits and other options for treatment, the patient has consented to  Procedure(s): RIGHT HEART CATH (Right) as a surgical intervention.  The patient's history has been reviewed, patient examined, no change in status, stable for surgery.  I have reviewed the patient's chart and labs.  Questions were answered to the patient's satisfaction.     [name]

## 2024-07-10 NOTE — Progress Notes (Signed)
 " Cardiology Office Note   Date:  07/15/2024  ID:  [name], DOB [birth year], MRN [id number] PCP: Rilla Baller, MD  [facility] Providers Cardiologist:  Redell Cave, MD   History of Present Illness [name] is a 73 y.o. male with a h/o persistent Afib on Eliquis , nonobstructive CAD (LHC 2016 LAD 30%, RCA 20%), HTN, HLD, COPD, current smoker, DDD who presents for follow-up of DOE.    Left heart cath in 2016 showed mildly calcified coronary arteries with mild two-vessel CAD involving the proximal to mid LAD and proximal to mid right coronary artery.  Normal LVSF.   Patient was noted to be in A-fib in September 2021. He was seen by his primary care for preop clearance and noted to be in A-fib.  He was referred to cardiology and started on Eliquis  and Lopressor .  Echo showed low normal systolic function with a EF of 50%.  Patient underwent cardioversion 05/06/2020 that was unsuccessful.  Patient saw EP who felt he was not a good ablation candidate.   Echo in July 2025 showed LVEF 60 to 65%, no wall motion abnormalities, mildly dilated left atrium.  Myoview  Lexi scan was normal without evidence of a significant ischemia or scar.  Normal EF, normal wall motion, low risk study.   The patient was last seen 06/11/24 reporting persistent DOE. Pulmonology recommended RHC. RHC showed mildly elevated right and left-sided filling pressures, mild pulmonary HTN and normal CO. Consider small dose diuretic.  Today, the RHC was discussed. He is in rate controlled Afib. He denies chest pain. He still feels breathing is short. He denies palpitations or tachycardia. He has cut back smoking, 3/4 a pack to 4-5 cigarettes daily. He does not formal activity, but tries to stay active at home.   Studies Reviewed EKG Interpretation Date/Time:  Monday July 15 2024 10:12:11 EST Ventricular Rate:  82 PR Interval:    QRS Duration:  92 QT Interval:  372 QTC Calculation: 434 R  Axis:   41  Text Interpretation: Atrial fibrillation When compared with ECG of 11-Jun-2024 10:10, No significant change was found Confirmed by Franchester, Jagger Beahm ([id number]) on 07/15/2024 10:28:22 AM    RHC 06/2024 Mildly elevated right and left-sided filling pressures, mild pulmonary hypertension and normal cardiac output.   RA: 12 mmHg RV: 37/3 mmHg PA: 40/22 with a mean of 29 mmHg.  Pulmonary vascular resistance is 2.3 Woods units. PW: 18 mmHg Cardiac output is 4.74 with an index of 2.33.   Recommendation: Consider small dose diuretic.    MPI 01/2024   Normal pharmacologic myocardial perfusion stress test without evidence of significant ischemia or scar.   Left ventricular systolic function is normal (LVEF 55-65%) with normal wall motion.   This is a low-risk study.     Echo 01/2024 1. Left ventricular ejection fraction, by estimation, is 60 to 65%. The  left ventricle has normal function. The left ventricle has no regional  wall motion abnormalities. Left ventricular diastolic parameters are  indeterminate.   2. Right ventricular systolic function is normal. The right ventricular  size is normal.   3. Left atrial size was mildly dilated.   4. The mitral valve is normal in structure. No evidence of mitral valve  regurgitation. No evidence of mitral stenosis.   5. The aortic valve is normal in structure. Aortic valve regurgitation is  not visualized. No aortic stenosis is present.   6. The inferior vena cava is normal in size with greater than 50%  respiratory variability, suggesting right atrial pressure of 3 mmHg.    Echo 03/2020 1. Left ventricular ejection fraction, by estimation, is 45 to 50%. The  left ventricle has mildly decreased function. The left ventricle  demonstrates global hypokinesis. There is mild left ventricular  hypertrophy. Left ventricular diastolic parameters  are indeterminate.   2. Right ventricular systolic function is mildly reduced. The right   ventricular size is mildly enlarged. Tricuspid regurgitation signal is  inadequate for assessing PA pressure.   3. Right atrial size was mildly dilated.   4. The mitral valve is normal in structure. Trivial mitral valve  regurgitation.   5. The aortic valve is tricuspid. Aortic valve regurgitation is not  visualized. No aortic stenosis is present.   6. The inferior vena cava is normal in size with greater than 50%  respiratory variability, suggesting right atrial pressure of 3 mmHg.    LHC 2016   The left ventricular systolic function is normal. Prox LAD to Mid LAD lesion, 30% stenosed. Prox RCA to Mid RCA lesion, 20% stenosed.   1. Mildly calcified coronary arteries with mild two-vessel coronary artery disease involving the proximal to mid LAD and proximal to midright coronary artery. No evidence of obstructive disease. 2. Normal LV systolic function. 3. Moderately elevated left ventricular end-diastolic pressure at 24 mmHg.   Recommendations: The patient has no evidence of obstructive coronary artery disease. Continue aggressive medical therapy and treatment of risk factors. He does seem to have diastolic heart failure as evidenced by moderately elevated left ventricular end-diastolic pressure. The patient might and affect from a small dose furosemide . He also has symptoms suggestive of sleep apnea which might be contributing to his diastolic heart failure. Recommend pulmonary evaluation for this.     Physical Exam VS:  BP (!) 144/70 (BP Location: Left Arm, Patient Position: Sitting, Cuff Size: Large)   Pulse 82   Ht 5' 6 (1.676 m)   Wt 211 lb 8 oz (95.9 kg)   SpO2 98%   BMI 34.14 kg/m        Wt Readings from Last 3 Encounters:  07/15/24 211 lb 8 oz (95.9 kg)  06/24/24 208 lb (94.3 kg)  06/11/24 211 lb 12.8 oz (96.1 kg)    GEN: Well nourished, well developed in no acute distress NECK: No JVD; No carotid bruits CARDIAC: RRR, no murmurs, rubs, gallops RESPIRATORY:  minimal  wheezing ABDOMEN: Soft, non-tender, non-distended EXTREMITIES:  No edema; No deformity   ASSESSMENT AND PLAN  DOE RHC showed mildly elevated right and left sided filling pressures, mild pulmonary HTN and normal CO. Consider small dose diuretic. Patient reports breathing is the same. I will start lasix  20mg  daily. BMET in 2 weeks. He is trying to cut back on smoking. I recommend walking program. We will assess symptoms in 3 months.   Persistent Afib He remains in rate controlled Afib. Continue Eliquis  5mg  BID and Lopressor  50mg  BID.  HTN BP is a little high today. Start lasix  as above. Continue Lopressor  and lisinopril . Recommend walking program as above.   Tobacco use He has cut back smoking. He was congradulated.        Dispo: Follow-up in 3 months  Signed, Munira Polson VEAR Fishman, PA-C   "

## 2024-07-15 ENCOUNTER — Ambulatory Visit: Attending: Medical | Admitting: Medical

## 2024-07-15 ENCOUNTER — Other Ambulatory Visit: Payer: Self-pay

## 2024-07-15 ENCOUNTER — Encounter: Payer: Self-pay | Admitting: Medical

## 2024-07-15 VITALS — BP 144/70 | HR 82 | Ht 66.0 in | Wt 211.5 lb

## 2024-07-15 DIAGNOSIS — I2510 Atherosclerotic heart disease of native coronary artery without angina pectoris: Secondary | ICD-10-CM

## 2024-07-15 DIAGNOSIS — I10 Essential (primary) hypertension: Secondary | ICD-10-CM | POA: Diagnosis not present

## 2024-07-15 DIAGNOSIS — I4819 Other persistent atrial fibrillation: Secondary | ICD-10-CM

## 2024-07-15 DIAGNOSIS — R0609 Other forms of dyspnea: Secondary | ICD-10-CM

## 2024-07-15 DIAGNOSIS — Z79899 Other long term (current) drug therapy: Secondary | ICD-10-CM | POA: Diagnosis not present

## 2024-07-15 MED ORDER — FUROSEMIDE 20 MG PO TABS
20.0000 mg | ORAL_TABLET | Freq: Every day | ORAL | 3 refills | Status: AC
  Filled 2024-07-15: qty 90, 90d supply, fill #0

## 2024-07-15 MED ORDER — FUROSEMIDE 20 MG PO TABS: 20.0000 mg | ORAL_TABLET | Freq: Every day | ORAL | 3 refills | Status: DC

## 2024-07-15 NOTE — Patient Instructions (Signed)
 Medication Instructions:  Your physician recommends the following medication changes.  START TAKING: Lasix  20 mg by mouth daily  *If you need a refill on your cardiac medications before your next appointment, please call your pharmacy*  Lab Work: Your provider would like for you to return in 2 weeks to have the following labs drawn: BMP.   Please go to [facility] [address] ([facility] [address], [place] [address] You do not need an appointment.  They are open from 8 am- 4:30 pm.  Lunch from 1:00 pm- 2:00 pm You will not need to be fasting.    Testing/Procedures: No test ordered today   Follow-Up: At [facility], you and your health needs are our priority.  As part of our continuing mission to provide you with exceptional heart care, our providers are all part of one team.  This team includes your primary Cardiologist (physician) and Advanced Practice Providers or APPs (Physician Assistants and Nurse Practitioners) who all work together to provide you with the care you need, when you need it.  Your next appointment:   3 month(s)  Provider:   Redell Cave, MD or Cadence Franchester, PA-C

## 2024-07-30 ENCOUNTER — Ambulatory Visit: Payer: Self-pay | Admitting: Medical

## 2024-07-30 LAB — BASIC METABOLIC PANEL WITH GFR
BUN/Creatinine Ratio: 16 (ref 10–24)
BUN: 16 mg/dL (ref 8–27)
CO2: 23 mmol/L (ref 20–29)
Calcium: 9.6 mg/dL (ref 8.6–10.2)
Chloride: 98 mmol/L (ref 96–106)
Creatinine, Ser: 0.99 mg/dL (ref 0.76–1.27)
Glucose: 148 mg/dL — ABNORMAL HIGH (ref 70–99)
Potassium: 4.4 mmol/L (ref 3.5–5.2)
Sodium: 136 mmol/L (ref 134–144)
eGFR: 80 mL/min/1.73

## 2024-08-23 ENCOUNTER — Encounter: Payer: Self-pay | Admitting: Student in an Organized Health Care Education/Training Program

## 2024-08-23 ENCOUNTER — Ambulatory Visit: Admitting: Student in an Organized Health Care Education/Training Program

## 2024-08-23 ENCOUNTER — Ambulatory Visit

## 2024-08-23 VITALS — BP 138/80 | HR 87 | Temp 98.1°F | Ht 66.0 in | Wt 209.8 lb

## 2024-08-23 DIAGNOSIS — J449 Chronic obstructive pulmonary disease, unspecified: Secondary | ICD-10-CM

## 2024-08-23 DIAGNOSIS — I5032 Chronic diastolic (congestive) heart failure: Secondary | ICD-10-CM

## 2024-08-23 DIAGNOSIS — F17200 Nicotine dependence, unspecified, uncomplicated: Secondary | ICD-10-CM

## 2024-08-23 DIAGNOSIS — J42 Unspecified chronic bronchitis: Secondary | ICD-10-CM

## 2024-08-23 LAB — PULMONARY FUNCTION TEST
DL/VA % pred: 93 %
DL/VA: 3.74 ml/min/mmHg/L
DLCO unc % pred: 79 %
DLCO unc: 17.65 ml/min/mmHg
FEF 25-75 Post: 1.12 L/s
FEF 25-75 Pre: 0.74 L/s
FEF2575-%Change-Post: 51 %
FEF2575-%Pred-Post: 57 %
FEF2575-%Pred-Pre: 37 %
FEV1-%Change-Post: 10 %
FEV1-%Pred-Post: 61 %
FEV1-%Pred-Pre: 55 %
FEV1-Post: 1.63 L
FEV1-Pre: 1.48 L
FEV1FVC-%Change-Post: -4 %
FEV1FVC-%Pred-Pre: 88 %
FEV6-%Change-Post: 15 %
FEV6-%Pred-Post: 76 %
FEV6-%Pred-Pre: 66 %
FEV6-Post: 2.61 L
FEV6-Pre: 2.26 L
FEV6FVC-%Change-Post: 0 %
FEV6FVC-%Pred-Post: 105 %
FEV6FVC-%Pred-Pre: 105 %
FVC-%Change-Post: 15 %
FVC-%Pred-Post: 72 %
FVC-%Pred-Pre: 62 %
FVC-Post: 2.65 L
FVC-Pre: 2.29 L
Post FEV1/FVC ratio: 62 %
Post FEV6/FVC ratio: 99 %
Pre FEV1/FVC ratio: 64 %
Pre FEV6/FVC Ratio: 99 %
RV % pred: 152 %
RV: 3.49 L
TLC % pred: 106 %
TLC: 6.63 L

## 2024-08-23 NOTE — Progress Notes (Signed)
 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 Symptoms are currently managed with triple therapy (ICS/LABA-Wixela and LAMA-Spiriva  Respimat). Continue current inhaler regimen. Advise smoking cessation. Recommend regular vaccinations, including flu and RSV.   -Continue Wixela 250-50 one puff twice daily -Continue Spiriva  Respimat two puffs daily  #Tobacco Use Disorder  He continues to smoke a few cigarettes daily, posing a significant risk for progression, and I counseled him at length about the importance of smoking cessation. Smoking cessation is the most critical intervention to prevent progression.  #HFpEF #Afib  History of HFpEF with prior LVEDP of 24. 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 He has followed with cardiology and was initiated on low dose furosemide .   Return in about 6 months (around 02/20/2025).  [name], MD [facility]  I spent 36 minutes caring for this patient today, including preparing to see the patient, obtaining a medical history , reviewing a separately obtained history, performing a medically appropriate examination and/or evaluation, counseling and educating the patient/family/caregiver, documenting clinical information in the electronic health record, and independently interpreting results (not separately reported/billed) and communicating results to the patient/family/caregiver.4 minutes utilized during today's visit to counsel the patient regarding the importance and strategies of smoking cessation.  End of visit medications:  No orders of the defined types were placed in this encounter.   Current Medications[1]   Subjective:   PATIENT ID: [name] GENDER: male DOB: 12/01/1950, MRN:  [id number]  Chief Complaint  Patient presents with   Follow-up    DOE. Occasional wheezing. Cough in the morning. PFT today. Wixela- once a day. Spiriva - once a day. Albuterol - PRN.    HPI  Discussed the use of AI scribe software for clinical note transcription with the patient, who gave verbal consent to proceed.  [name] is a 74 year old male with COPD and mild heart failure who presents for follow-up of shortness of breath.  Initial Visit 05/16/2024:  He experiences significant shortness of breath during physical activities such as mowing the yard or walking 100 yards, which leaves him gasping for air. Even minimal activity can exacerbate his symptoms. He has been retired for ten years and lacks regular exercise due to pain from multiple surgeries.   He has a history of smoking, currently smoking three to four cigarettes a day, reduced from half a pack. He associates smoking with drinking beer. He uses inhalers, including Wixela daily and Spiriva  occasionally, which he obtains from the [facility]. These inhalers help alleviate his symptoms.   He has a history of heart failure with preserved ejection fraction, with a cardiac catheterization in 2016 showing elevated left ventricular end-diastolic pressure (LVEDP of 24 mmHg 06/2015). He recalls being short of breath during physical activity around that time. He also has atrial fibrillation, diagnosed a few years ago, and recently saw his cardiologist who reported his heart was 'good to go.'   He undergoes annual lung cancer screenings due to his smoking history. He experiences an occasional cough with phlegm, especially after walking or in the morning when using his inhaler.  Return Visit 08/23/2024:  He has persistent shortness of breath that varies with activity levels and may be slightly worsening. He was diagnosed with COPD over 20 years ago and continues to smoke  a few cigarettes daily, which contributes to his symptoms. He does not  engage in regular exercise.  Pulmonary function tests conducted today indicate obstruction and air trapping. A recent right heart catheterization revealed elevated wedge pressure. He uses inhalers prescribed through the [facility], taking one puff twice daily and two puffs in the morning. Initially, he did not follow the correct dosing instructions but has since adjusted his routine. He experiences dry mouth from the inhalers and drinks fluids to alleviate this.  He recently started on Lasix , taking it daily. He avoids added salt and relies on his wife and daughter to monitor sodium content in foods.    He has a family history of heart disease, with both parents having had heart attacks and his father also having had a stroke in his late fifties to early sixties. He reports a significant smoking history, with 25-30 pack years of smoking history. He was exposed to asbestos in the [employer] ([employer]) and work teacher, early years/pre. He also worked in a [place] with exposure to [employer]. Also worked in a holiday representative with some exposures.   Ancillary information including prior medications, full medical/surgical/family/social histories, and PFTs (when available) are listed below and have been reviewed.    Review of Systems  Constitutional:  Negative for chills, fever and weight loss.  Respiratory:  Positive for shortness of breath. Negative for cough, hemoptysis, sputum production and wheezing.   Cardiovascular:  Negative for chest pain.    Objective:   Vitals:   08/23/24 1045  BP: 138/80  Pulse: 87  Temp: 98.1 F (36.7 C)  SpO2: 98%  Weight: 209 lb 12.8 oz (95.2 kg)  Height: 5' 6 (1.676 m)   98% on RA  BMI Readings from Last 3 Encounters:  08/23/24 33.86 kg/m  08/23/24 33.86 kg/m  07/15/24 34.14 kg/m   Wt Readings from Last 3 Encounters:  08/23/24 209 lb 12.8 oz (95.2 kg)  08/23/24 209 lb 12.8 oz (95.2 kg)  07/15/24 211 lb 8 oz (95.9 kg)     .vitalsmbmi  Physical Exam Constitutional:      Appearance: Normal appearance. He is obese.  Cardiovascular:     Rate and Rhythm: Normal rate and regular rhythm.     Pulses: Normal pulses.     Heart sounds: Normal heart sounds.  Pulmonary:     Effort: Pulmonary effort is normal.     Breath sounds: Normal breath sounds.  Neurological:     General: No focal deficit present.     Mental Status: He is alert and oriented to person, place, and time. Mental status is at baseline.     Ancillary Information    Past Medical History:  Diagnosis Date   Allergic rhinitis 07/1998   Aortic atherosclerosis 09/17/2016   Arthritis    BACK AND RIGHT KNEE   CAD (coronary artery disease) 07/01/2015   a.) LHC 07/01/2015: normal LV function; 30% p-mLAD, 20% p-mRCA; intervention deferred opting for medical mgmt.   COPD (chronic obstructive pulmonary disease) (HCC) 07/1998   centrilobular emphysema (09/2016) w ongoing tobacco use, spirometry (02/2013)   DDD (degenerative disc disease), cervical    DOE (dyspnea on exertion)    Erectile dysfunction    a.) on PDE5i (sildenafil )   ETOH abuse    a.) consumes 4-5 beers on a daily basis   History of exposure to asbestos    HTN (hypertension) 05/1998   Hyperlipidemia 06/1996   Hypertensive retinopathy    Long term current  use of anticoagulant    a.) apixaban    Lumbar spinal stenosis 03/2013   a.) s/p laminectomy   Macrocytic anemia    Migraines    Peripheral vascular disease    Persistent atrial fibrillation (HCC)    a.) CHA2DS2-VASc = 3 (age, HTN, aortic plaque). 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   Smoker      Family History  Problem Relation Age of Onset   Coronary artery disease Mother     Hypertension Mother    Stroke Mother    Coronary artery disease Father    Hypertension Father    Diabetes Father    Stroke Father    Pneumonia Sister    Irregular heart beat Brother    Maple syrup urine disease Brother    Cancer Neg Hx      Past Surgical History:  Procedure Laterality Date   ANTERIOR CERVICAL DECOMP/DISCECTOMY FUSION  2003   C3-7   APPLICATION OF INTRAOPERATIVE CT SCAN N/A 10/11/2021   Procedure: APPLICATION OF INTRAOPERATIVE CT SCAN;  Surgeon: Clois Fret, MD;  Location: [facility];  Service: Neurosurgery;  Laterality: N/A;   BACK SURGERY  2007   CARDIAC CATHETERIZATION N/A 07/01/2015   Procedure: Left Heart Cath and Coronary Angiography;  Surgeon: Deatrice DELENA Cage, MD;  Location: [facility];  Service: Cardiovascular;  Laterality: N/A;   CARDIOVERSION N/A 05/06/2020   Procedure: CARDIOVERSION;  Surgeon: Darliss Rogue, MD;  Location: [facility];  Service: Cardiovascular;  Laterality: N/A;   COLONOSCOPY  09/2010   HPx2, diverticula, hemorrhoids, rpt 10 yrs [place])   COLONOSCOPY  01/07/2021   SSAx2, diverticulosis, rpt 7 yrs ([place])   CYSTOSCOPY WITH INSERTION OF UROLIFT N/A 11/02/2020   Procedure: CYSTOSCOPY WITH INSERTION OF UROLIFT;  Surgeon: Penne Knee, MD;  Location: [facility];  Service: Urology;  Laterality: N/A;   ETT  2010   normal, ABIs normal   HIP SURGERY Right 08/2022   R lateral femoral cutaneous nerve decompression/transposition   KNEE ARTHROSCOPY Right 2011   [place], ligaments   KNEE ARTHROSCOPY Left 1984   LUMBAR LAMINECTOMY  06/2020   Bilateral L2-3 laminectomy [place])   LUMBAR LAMINECTOMY/DECOMPRESSION MICRODISCECTOMY Left 03/27/2013   Procedure: LUMBAR LAMINECTOMY/DECOMPRESSION MICRODISCECTOMY 2 LEVELS;  Surgeon: Reyes JONETTA Budge, MD;  Location: [facility];  Service: Neurosurgery;  Laterality: Left;  LEFT Lumbar Three Four diskectomy with Lumbar Three-Four, Four-Five Laminectomy. L3/4 ahd L4/5 for LSS [place])     LUMBAR LAMINECTOMY/DECOMPRESSION MICRODISCECTOMY Bilateral 07/15/2020   Procedure: LAMINECTOMY AND FORAMINOTOMY BILATERAL LUMBAR TWO-LUMBAR THREE;  Surgeon: Budge Reyes, MD;  Location: [facility] OR;  Service: Neurosurgery;  Laterality: Bilateral;   POSTERIOR CERVICAL FUSION/FORAMINOTOMY N/A 05/14/2020   Procedure: LAMINECTOMY, POSTERIOR INSTRUMENTATION AND FUSION, CERVICAL ONE- CERVICAL TWO, CERVICAL TWO- CERVICAL THREE;  Surgeon: Budge Reyes, MD;  [facility];  Service: Neurosurgery;  Laterality: N/A;   POSTERIOR CERVICAL FUSION/FORAMINOTOMY N/A 10/11/2021   Procedure: C6-T3 POSTERIOR FUSION;  Surgeon: Clois Fret, MD;  Location: [facility];  Service: Neurosurgery;  Laterality: N/A;   POSTERIOR CERVICAL LAMINECTOMY N/A 10/11/2021   Procedure: C6-T2 POSTERIOR SPINAL DECOMPRESSION;  Surgeon: Clois Fret, MD;  Location: [facility];  Service: Neurosurgery;  Laterality: N/A;   RIGHT HEART CATH Right 06/24/2024   Procedure: RIGHT HEART CATH;  Surgeon:  Darron Deatrice LABOR, MD;  Location: [facility];  Service: Cardiovascular;  Laterality: Right;   SHOULDER ARTHROSCOPY Right 2011   spriometry  02/2013   COPD with some restriction thought to be obesity related ([place])   TOTAL KNEE ARTHROPLASTY Right 06/27/2014   Procedure: RIGHT TOTAL KNEE ARTHROPLASTY;  Surgeon: Lonni CINDERELLA Poli, MD;  Location: [facility];  Service: Orthopedics;  Laterality: Right;   US  ECHOCARDIOGRAPHY  01/2013   WNL, mild diastolic dysfunction    Social History   Socioeconomic History   Marital status: Married    Spouse name: Not on file   Number of children: Not on file   Years of education: Not on file   Highest education level: Not on file  Occupational History   Not on file  Tobacco Use   Smoking status: Some Days    Current packs/day: 0.50    Average packs/day: 0.5 packs/day for 40.0 years (20.0 ttl pk-yrs)    Types: Cigarettes   Smokeless tobacco: Never   Tobacco comments:    4-5  cigarettes daily- khj 08/23/2024        Started smoking at 74 yrs old.    Smoked 0.5 PPD at his heaviest.   Vaping Use   Vaping status: Never Used  Substance and Sexual Activity   Alcohol use: Yes    Alcohol/week: 28.0 standard drinks of alcohol    Types: 28 Cans of beer per week    Comment: 4-5 beers daily   Drug use: No   Sexual activity: Not Currently  Other Topics Concern   Not on file  Social History Narrative   Lives with wife. Has 1 son, 2 daughters.    Occupation: Event organiser.    Activity: no exercise   Diet: good water, fruits/vegetables daily      Screened positive for OSA at recent hospitalization (03/2013)   Social Drivers of Health   Tobacco Use: High Risk (08/23/2024)   Patient History    Smoking Tobacco Use: Some Days    Smokeless Tobacco Use: Never    Passive Exposure: Not on file  Financial Resource Strain: Low Risk (01/16/2024)   Overall Financial Resource Strain (CARDIA)    Difficulty of Paying Living Expenses: Not very hard  Food Insecurity: No Food Insecurity (01/16/2024)   Epic    Worried About Radiation Protection Practitioner of Food in the Last Year: Never true    Ran Out of Food in the Last Year: Never true  Transportation Needs: No Transportation Needs (01/16/2024)   Epic    Lack of Transportation (Medical): No    Lack of Transportation (Non-Medical): No  Physical Activity: Insufficiently Active (01/16/2024)   Exercise Vital Sign    Days of Exercise per Week: 3 days    Minutes of Exercise per Session: 30 min  Stress: No Stress Concern Present (01/16/2024)   [employer] of Occupational Health - Occupational Stress Questionnaire    Feeling of Stress: Not at all  Social Connections: Socially Integrated (01/16/2024)   Social Connection and Isolation Panel    Frequency of Communication with Friends and Family: More than three times a week    Frequency of Social Gatherings with Friends and Family: More than three times a week    Attends Religious Services: More than 4  times per year    Active Member of [employer] or Organizations: Yes    Attends Engineer, Structural: More than 4 times per year    Marital Status: Married  Catering Manager Violence:  Not At Risk (01/16/2024)   Epic    Fear of Current or Ex-Partner: No    Emotionally Abused: No    Physically Abused: No    Sexually Abused: No  Depression (PHQ2-9): Low Risk (01/22/2024)   Depression (PHQ2-9)    PHQ-2 Score: 0  Alcohol Screen: Low Risk (01/16/2024)   Alcohol Screen    Last Alcohol Screening Score (AUDIT): 4  Housing: Not on file  Utilities: Not At Risk (01/16/2024)   Epic    Threatened with loss of utilities: No  Health Literacy: Adequate Health Literacy (01/16/2024)   B1300 Health Literacy    Frequency of need for help with medical instructions: Never     Allergies[2]   CBC    Component Value Date/Time   WBC 5.4 06/11/2024 1051   WBC 4.6 01/15/2024 0758   RBC 3.58 (L) 06/11/2024 1051   RBC 3.43 (L) 01/15/2024 0758   HGB 12.9 (L) 06/24/2024 1010   HGB 12.5 (L) 06/11/2024 1051   HCT 38.0 (L) 06/24/2024 1010   HCT 37.8 06/11/2024 1051   PLT 215 06/11/2024 1051   MCV 106 (H) 06/11/2024 1051   MCH 34.9 (H) 06/11/2024 1051   MCH 33.5 10/11/2021 2157   MCHC 33.1 06/11/2024 1051   MCHC 34.3 01/15/2024 0758   RDW 11.9 06/11/2024 1051   LYMPHSABS 1.8 01/15/2024 0758   MONOABS 0.4 01/15/2024 0758   EOSABS 0.2 01/15/2024 0758   BASOSABS 0.0 01/15/2024 0758    Pulmonary Functions Testing Results:    Latest Ref Rng & Units 08/23/2024    9:25 AM  PFT Results  FVC-Pre L 2.29   FVC-Predicted Pre % 62   FVC-Post L 2.65   FVC-Predicted Post % 72   Pre FEV1/FVC % % 64   Post FEV1/FCV % % 62   FEV1-Pre L 1.48   FEV1-Predicted Pre % 55   FEV1-Post L 1.63   DLCO uncorrected ml/min/mmHg 17.65   DLCO UNC% % 79   DLVA Predicted % 93   TLC L 6.63   TLC % Predicted % 106   RV % Predicted % 152     Outpatient Medications Prior to Visit  Medication Sig Dispense Refill    acetaminophen  (TYLENOL  8 HOUR) 650 MG CR tablet Take 1 tablet (650 mg total) by mouth in the morning and at bedtime.     Albuterol  Sulfate (PROAIR  RESPICLICK) 108 (90 Base) MCG/ACT AEPB Inhale 2 puffs into the lungs every 6 (six) hours as needed (cough, shortness of breath). 1 each 3   apixaban  (ELIQUIS ) 5 MG TABS tablet Take 5 mg by mouth 2 (two) times daily.     atorvastatin  (LIPITOR) 10 MG tablet TAKE 1 TABLET(10 MG) BY MOUTH DAILY 90 tablet 3   Cholecalciferol  25 MCG (1000 UT) tablet Take 1,000 Units by mouth daily.     fluticasone -salmeterol (WIXELA INHUB ) 250-50 MCG/ACT AEPB Inhale 1 puff into the lungs in the morning and at bedtime. 90 each 12   furosemide  (LASIX ) 20 MG tablet Take 1 tablet (20 mg total) by mouth daily. 90 tablet 3   lisinopril  (ZESTRIL ) 40 MG tablet TAKE 1 TABLET(40 MG) BY MOUTH DAILY 90 tablet 3   loratadine (CLARITIN) 10 MG tablet Take 10 mg by mouth as needed.     metoprolol  tartrate (LOPRESSOR ) 50 MG tablet Take 1 tablet (50 mg total) by mouth 2 (two) times daily. 60 tablet 3   nicotine  polacrilex (NICORETTE) 4 MG gum Take 4 mg by mouth as needed.  OVER THE COUNTER MEDICATION Take 2 tablets by mouth daily. sulfurzyme     sildenafil  (VIAGRA ) 50 MG tablet Take 50 mg by mouth daily as needed for erectile dysfunction. Taking 1/2 tablet PRN (from [facility])     Tiotropium Bromide  (SPIRIVA  RESPIMAT) 2.5 MCG/ACT AERS Inhale 2 puffs into the lungs daily. 4 g 12   No facility-administered medications prior to visit.      [1]  Current Outpatient Medications:    acetaminophen  (TYLENOL  8 HOUR) 650 MG CR tablet, Take 1 tablet (650 mg total) by mouth in the morning and at bedtime., Disp: , Rfl:    Albuterol  Sulfate (PROAIR  RESPICLICK) 108 (90 Base) MCG/ACT AEPB, Inhale 2 puffs into the lungs every 6 (six) hours as needed (cough, shortness of breath)., Disp: 1 each, Rfl: 3   apixaban  (ELIQUIS ) 5 MG TABS tablet, Take 5 mg by mouth 2 (two) times daily., Disp: , Rfl:     atorvastatin  (LIPITOR) 10 MG tablet, TAKE 1 TABLET(10 MG) BY MOUTH DAILY, Disp: 90 tablet, Rfl: 3   Cholecalciferol  25 MCG (1000 UT) tablet, Take 1,000 Units by mouth daily., Disp: , Rfl:    fluticasone -salmeterol (WIXELA INHUB ) 250-50 MCG/ACT AEPB, Inhale 1 puff into the lungs in the morning and at bedtime., Disp: 90 each, Rfl: 12   furosemide  (LASIX ) 20 MG tablet, Take 1 tablet (20 mg total) by mouth daily., Disp: 90 tablet, Rfl: 3   lisinopril  (ZESTRIL ) 40 MG tablet, TAKE 1 TABLET(40 MG) BY MOUTH DAILY, Disp: 90 tablet, Rfl: 3   loratadine (CLARITIN) 10 MG tablet, Take 10 mg by mouth as needed., Disp: , Rfl:    metoprolol  tartrate (LOPRESSOR ) 50 MG tablet, Take 1 tablet (50 mg total) by mouth 2 (two) times daily., Disp: 60 tablet, Rfl: 3   nicotine  polacrilex (NICORETTE) 4 MG gum, Take 4 mg by mouth as needed., Disp: , Rfl:    OVER THE COUNTER MEDICATION, Take 2 tablets by mouth daily. sulfurzyme, Disp: , Rfl:    sildenafil  (VIAGRA ) 50 MG tablet, Take 50 mg by mouth daily as needed for erectile dysfunction. Taking 1/2 tablet PRN (from [facility]), Disp: , Rfl:    Tiotropium Bromide  (SPIRIVA  RESPIMAT) 2.5 MCG/ACT AERS, Inhale 2 puffs into the lungs daily., Disp: 4 g, Rfl: 12 [2]  Allergies Allergen Reactions   Varenicline  Other (See Comments)    headaches   "

## 2024-08-23 NOTE — Progress Notes (Signed)
 Full PFT completed today ? ?

## 2024-08-23 NOTE — Patient Instructions (Signed)
 Full PFT completed today ? ?

## 2024-10-16 ENCOUNTER — Ambulatory Visit: Admitting: Medical

## 2025-01-16 ENCOUNTER — Other Ambulatory Visit

## 2025-01-24 ENCOUNTER — Encounter: Admitting: Family Medicine
